# Patient Record
Sex: Female | Born: 1950 | Race: White | Hispanic: No | Smoking: Never smoker
Health system: Southern US, Community
[De-identification: ages and names within clinical notes are randomized; demographics above are authoritative.]

## PROBLEM LIST (undated history)

## (undated) DIAGNOSIS — I10 Essential (primary) hypertension: Secondary | ICD-10-CM

## (undated) DIAGNOSIS — K449 Diaphragmatic hernia without obstruction or gangrene: Secondary | ICD-10-CM

## (undated) DIAGNOSIS — E782 Mixed hyperlipidemia: Secondary | ICD-10-CM

## (undated) DIAGNOSIS — F410 Panic disorder [episodic paroxysmal anxiety] without agoraphobia: Secondary | ICD-10-CM

## (undated) DIAGNOSIS — E785 Hyperlipidemia, unspecified: Secondary | ICD-10-CM

## (undated) DIAGNOSIS — Z7722 Contact with and (suspected) exposure to environmental tobacco smoke (acute) (chronic): Secondary | ICD-10-CM

## (undated) DIAGNOSIS — F32A Depression, unspecified: Secondary | ICD-10-CM

## (undated) DIAGNOSIS — F329 Major depressive disorder, single episode, unspecified: Secondary | ICD-10-CM

## (undated) DIAGNOSIS — K649 Unspecified hemorrhoids: Secondary | ICD-10-CM

## (undated) DIAGNOSIS — Z87442 Personal history of urinary calculi: Secondary | ICD-10-CM

## (undated) DIAGNOSIS — K635 Polyp of colon: Secondary | ICD-10-CM

## (undated) DIAGNOSIS — N289 Disorder of kidney and ureter, unspecified: Secondary | ICD-10-CM

## (undated) DIAGNOSIS — K259 Gastric ulcer, unspecified as acute or chronic, without hemorrhage or perforation: Secondary | ICD-10-CM

## (undated) DIAGNOSIS — M1990 Unspecified osteoarthritis, unspecified site: Secondary | ICD-10-CM

## (undated) DIAGNOSIS — F419 Anxiety disorder, unspecified: Secondary | ICD-10-CM

## (undated) DIAGNOSIS — K219 Gastro-esophageal reflux disease without esophagitis: Secondary | ICD-10-CM

## (undated) HISTORY — DX: Contact with and (suspected) exposure to environmental tobacco smoke (acute) (chronic): Z77.22

## (undated) HISTORY — DX: Diaphragmatic hernia without obstruction or gangrene: K44.9

## (undated) HISTORY — DX: Mixed hyperlipidemia: E78.2

## (undated) HISTORY — PX: OOPHORECTOMY: SHX86

## (undated) HISTORY — DX: Essential (primary) hypertension: I10

## (undated) HISTORY — PX: UPPER GASTROINTESTINAL ENDOSCOPY: SHX188

## (undated) HISTORY — DX: Unspecified osteoarthritis, unspecified site: M19.90

## (undated) HISTORY — DX: Unspecified hemorrhoids: K64.9

## (undated) HISTORY — DX: Gastric ulcer, unspecified as acute or chronic, without hemorrhage or perforation: K25.9

## (undated) HISTORY — PX: COLONOSCOPY: SHX174

## (undated) HISTORY — PX: TUBAL LIGATION: SHX77

## (undated) HISTORY — PX: ABDOMINAL HYSTERECTOMY: SHX81

## (undated) HISTORY — DX: Anxiety disorder, unspecified: F41.9

## (undated) HISTORY — DX: Polyp of colon: K63.5

## (undated) HISTORY — DX: Hyperlipidemia, unspecified: E78.5

## (undated) HISTORY — DX: Major depressive disorder, single episode, unspecified: F32.9

## (undated) HISTORY — PX: HAND SURGERY: SHX662

## (undated) HISTORY — DX: Depression, unspecified: F32.A

## (undated) HISTORY — DX: Panic disorder (episodic paroxysmal anxiety): F41.0

## (undated) HISTORY — DX: Gastro-esophageal reflux disease without esophagitis: K21.9

---

## 1998-06-15 ENCOUNTER — Other Ambulatory Visit: Admission: RE | Admit: 1998-06-15 | Discharge: 1998-06-15 | Payer: Self-pay | Admitting: Gynecology

## 1998-06-18 ENCOUNTER — Ambulatory Visit (HOSPITAL_COMMUNITY): Admission: RE | Admit: 1998-06-18 | Discharge: 1998-06-18 | Payer: Self-pay | Admitting: Internal Medicine

## 1998-11-02 ENCOUNTER — Encounter: Payer: Self-pay | Admitting: Urology

## 1998-11-02 ENCOUNTER — Ambulatory Visit (HOSPITAL_COMMUNITY): Admission: RE | Admit: 1998-11-02 | Discharge: 1998-11-02 | Payer: Self-pay | Admitting: Urology

## 1999-05-09 ENCOUNTER — Encounter: Payer: Self-pay | Admitting: Emergency Medicine

## 1999-05-09 ENCOUNTER — Emergency Department (HOSPITAL_COMMUNITY): Admission: EM | Admit: 1999-05-09 | Discharge: 1999-05-09 | Payer: Self-pay | Admitting: Emergency Medicine

## 1999-06-22 ENCOUNTER — Other Ambulatory Visit: Admission: RE | Admit: 1999-06-22 | Discharge: 1999-06-22 | Payer: Self-pay | Admitting: Gynecology

## 1999-06-27 ENCOUNTER — Encounter: Payer: Self-pay | Admitting: Internal Medicine

## 1999-06-27 ENCOUNTER — Ambulatory Visit (HOSPITAL_COMMUNITY): Admission: RE | Admit: 1999-06-27 | Discharge: 1999-06-27 | Payer: Self-pay | Admitting: Internal Medicine

## 1999-09-21 ENCOUNTER — Encounter: Payer: Self-pay | Admitting: Emergency Medicine

## 1999-09-21 ENCOUNTER — Emergency Department (HOSPITAL_COMMUNITY): Admission: EM | Admit: 1999-09-21 | Discharge: 1999-09-21 | Payer: Self-pay | Admitting: Emergency Medicine

## 1999-10-20 ENCOUNTER — Encounter: Admission: RE | Admit: 1999-10-20 | Discharge: 1999-10-20 | Payer: Self-pay | Admitting: Neurosurgery

## 1999-10-20 ENCOUNTER — Encounter: Payer: Self-pay | Admitting: Neurosurgery

## 1999-11-15 ENCOUNTER — Encounter: Payer: Self-pay | Admitting: Neurosurgery

## 1999-11-15 ENCOUNTER — Ambulatory Visit (HOSPITAL_COMMUNITY): Admission: RE | Admit: 1999-11-15 | Discharge: 1999-11-15 | Payer: Self-pay | Admitting: Neurosurgery

## 1999-12-07 ENCOUNTER — Ambulatory Visit (HOSPITAL_COMMUNITY): Admission: RE | Admit: 1999-12-07 | Discharge: 1999-12-07 | Payer: Self-pay | Admitting: Neurosurgery

## 1999-12-07 ENCOUNTER — Encounter: Payer: Self-pay | Admitting: Neurosurgery

## 2000-05-17 ENCOUNTER — Encounter: Payer: Self-pay | Admitting: Gastroenterology

## 2000-05-17 ENCOUNTER — Encounter: Admission: RE | Admit: 2000-05-17 | Discharge: 2000-05-17 | Payer: Self-pay | Admitting: Gastroenterology

## 2000-06-28 ENCOUNTER — Encounter: Payer: Self-pay | Admitting: Internal Medicine

## 2000-06-28 ENCOUNTER — Other Ambulatory Visit: Admission: RE | Admit: 2000-06-28 | Discharge: 2000-06-28 | Payer: Self-pay | Admitting: Gynecology

## 2000-06-28 ENCOUNTER — Ambulatory Visit (HOSPITAL_COMMUNITY): Admission: RE | Admit: 2000-06-28 | Discharge: 2000-06-28 | Payer: Self-pay | Admitting: Internal Medicine

## 2000-10-31 ENCOUNTER — Ambulatory Visit (HOSPITAL_COMMUNITY): Admission: RE | Admit: 2000-10-31 | Discharge: 2000-10-31 | Payer: Self-pay | Admitting: Internal Medicine

## 2000-10-31 ENCOUNTER — Encounter: Payer: Self-pay | Admitting: Internal Medicine

## 2001-04-09 ENCOUNTER — Ambulatory Visit (HOSPITAL_COMMUNITY): Admission: RE | Admit: 2001-04-09 | Discharge: 2001-04-09 | Payer: Self-pay | Admitting: Internal Medicine

## 2001-04-09 ENCOUNTER — Encounter: Payer: Self-pay | Admitting: Internal Medicine

## 2001-07-01 ENCOUNTER — Other Ambulatory Visit: Admission: RE | Admit: 2001-07-01 | Discharge: 2001-07-01 | Payer: Self-pay | Admitting: Gynecology

## 2001-07-04 ENCOUNTER — Ambulatory Visit (HOSPITAL_COMMUNITY): Admission: RE | Admit: 2001-07-04 | Discharge: 2001-07-04 | Payer: Self-pay | Admitting: Internal Medicine

## 2001-07-04 ENCOUNTER — Encounter: Payer: Self-pay | Admitting: Internal Medicine

## 2002-07-11 ENCOUNTER — Encounter: Payer: Self-pay | Admitting: Internal Medicine

## 2002-07-11 ENCOUNTER — Encounter: Admission: RE | Admit: 2002-07-11 | Discharge: 2002-07-11 | Payer: Self-pay | Admitting: Internal Medicine

## 2002-07-18 ENCOUNTER — Other Ambulatory Visit: Admission: RE | Admit: 2002-07-18 | Discharge: 2002-07-18 | Payer: Self-pay | Admitting: Gynecology

## 2002-07-23 ENCOUNTER — Encounter: Admission: RE | Admit: 2002-07-23 | Discharge: 2002-07-23 | Payer: Self-pay | Admitting: Internal Medicine

## 2002-07-23 ENCOUNTER — Encounter: Payer: Self-pay | Admitting: Internal Medicine

## 2003-06-30 ENCOUNTER — Encounter: Admission: RE | Admit: 2003-06-30 | Discharge: 2003-06-30 | Payer: Self-pay | Admitting: Family Medicine

## 2003-07-06 ENCOUNTER — Encounter: Admission: RE | Admit: 2003-07-06 | Discharge: 2003-07-06 | Payer: Self-pay | Admitting: Internal Medicine

## 2003-07-20 ENCOUNTER — Other Ambulatory Visit: Admission: RE | Admit: 2003-07-20 | Discharge: 2003-07-20 | Payer: Self-pay | Admitting: Gynecology

## 2003-07-29 ENCOUNTER — Encounter: Admission: RE | Admit: 2003-07-29 | Discharge: 2003-07-29 | Payer: Self-pay | Admitting: Internal Medicine

## 2004-07-20 ENCOUNTER — Other Ambulatory Visit: Admission: RE | Admit: 2004-07-20 | Discharge: 2004-07-20 | Payer: Self-pay | Admitting: Gynecology

## 2004-08-01 ENCOUNTER — Encounter: Admission: RE | Admit: 2004-08-01 | Discharge: 2004-08-01 | Payer: Self-pay | Admitting: Gynecology

## 2004-09-23 ENCOUNTER — Encounter: Admission: RE | Admit: 2004-09-23 | Discharge: 2004-09-23 | Payer: Self-pay | Admitting: Internal Medicine

## 2005-07-25 ENCOUNTER — Other Ambulatory Visit: Admission: RE | Admit: 2005-07-25 | Discharge: 2005-07-25 | Payer: Self-pay | Admitting: Gynecology

## 2005-08-08 ENCOUNTER — Encounter: Admission: RE | Admit: 2005-08-08 | Discharge: 2005-08-08 | Payer: Self-pay | Admitting: Gynecology

## 2006-08-07 ENCOUNTER — Other Ambulatory Visit: Admission: RE | Admit: 2006-08-07 | Discharge: 2006-08-07 | Payer: Self-pay | Admitting: Gynecology

## 2006-08-09 ENCOUNTER — Encounter: Admission: RE | Admit: 2006-08-09 | Discharge: 2006-08-09 | Payer: Self-pay | Admitting: Internal Medicine

## 2006-09-04 HISTORY — PX: FOOT SURGERY: SHX648

## 2006-12-14 ENCOUNTER — Ambulatory Visit (HOSPITAL_COMMUNITY): Admission: RE | Admit: 2006-12-14 | Discharge: 2006-12-14 | Payer: Self-pay | Admitting: Internal Medicine

## 2007-01-05 ENCOUNTER — Emergency Department (HOSPITAL_COMMUNITY): Admission: EM | Admit: 2007-01-05 | Discharge: 2007-01-05 | Payer: Self-pay | Admitting: Emergency Medicine

## 2007-05-15 ENCOUNTER — Ambulatory Visit (HOSPITAL_COMMUNITY): Admission: RE | Admit: 2007-05-15 | Discharge: 2007-05-15 | Payer: Self-pay | Admitting: Internal Medicine

## 2007-08-12 ENCOUNTER — Other Ambulatory Visit: Admission: RE | Admit: 2007-08-12 | Discharge: 2007-08-12 | Payer: Self-pay | Admitting: Gynecology

## 2007-08-14 ENCOUNTER — Encounter: Admission: RE | Admit: 2007-08-14 | Discharge: 2007-08-14 | Payer: Self-pay | Admitting: Internal Medicine

## 2007-11-06 ENCOUNTER — Ambulatory Visit: Payer: Self-pay | Admitting: Internal Medicine

## 2007-12-04 ENCOUNTER — Ambulatory Visit: Payer: Self-pay | Admitting: Internal Medicine

## 2007-12-12 ENCOUNTER — Ambulatory Visit (HOSPITAL_COMMUNITY): Admission: RE | Admit: 2007-12-12 | Discharge: 2007-12-12 | Payer: Self-pay | Admitting: Internal Medicine

## 2007-12-24 ENCOUNTER — Ambulatory Visit: Payer: Self-pay | Admitting: Internal Medicine

## 2008-03-24 ENCOUNTER — Ambulatory Visit (HOSPITAL_COMMUNITY): Admission: RE | Admit: 2008-03-24 | Discharge: 2008-03-24 | Payer: Self-pay | Admitting: Internal Medicine

## 2008-08-14 ENCOUNTER — Encounter: Admission: RE | Admit: 2008-08-14 | Discharge: 2008-08-14 | Payer: Self-pay | Admitting: Internal Medicine

## 2008-09-08 ENCOUNTER — Ambulatory Visit (HOSPITAL_COMMUNITY): Admission: RE | Admit: 2008-09-08 | Discharge: 2008-09-08 | Payer: Self-pay | Admitting: Internal Medicine

## 2009-08-03 ENCOUNTER — Ambulatory Visit (HOSPITAL_COMMUNITY): Admission: RE | Admit: 2009-08-03 | Discharge: 2009-08-03 | Payer: Self-pay | Admitting: Internal Medicine

## 2009-10-04 ENCOUNTER — Ambulatory Visit (HOSPITAL_COMMUNITY): Admission: RE | Admit: 2009-10-04 | Discharge: 2009-10-04 | Payer: Self-pay | Admitting: Internal Medicine

## 2010-08-04 ENCOUNTER — Ambulatory Visit (HOSPITAL_COMMUNITY)
Admission: RE | Admit: 2010-08-04 | Discharge: 2010-08-04 | Payer: Self-pay | Source: Home / Self Care | Admitting: Gynecology

## 2010-08-31 ENCOUNTER — Encounter
Admission: RE | Admit: 2010-08-31 | Discharge: 2010-09-06 | Payer: Self-pay | Source: Home / Self Care | Attending: Sports Medicine | Admitting: Sports Medicine

## 2010-09-24 ENCOUNTER — Encounter: Payer: Self-pay | Admitting: Anesthesiology

## 2010-09-26 ENCOUNTER — Encounter: Payer: Self-pay | Admitting: Internal Medicine

## 2011-01-17 NOTE — Assessment & Plan Note (Signed)
[facility]                         GASTROENTEROLOGY OFFICE NOTE   NAME:[name], [name]                       MRN:          [id number]  DATE:12/04/2007                            DOB:          1950/12/09    CHIEF COMPLAINT:  Stomach nausea, vomiting for three months.   ASSESSMENT:  A 60 year old white woman with chronic recurrent  regurgitation.  Some nausea and vomiting, more of regurgitation.  Recent  EGD in [facility] (Dr. Noe [name]) showed some esophagitis and an 8 mm  gastric ulcer.  I do not have those biopsy reports yet.  She did not  want to return to him because she was dissatisfied with his care.   PLAN:  1. Check gastric emptying study.  2. Get results of upper endoscopy.  3. Further plans pending that.   I need the pathology results.  She will continue her other medications  at this time.   HISTORY:  This is a 60 year old white woman who for the past three  months or so has had problems with regurgitation, which is sudden, can  occur when she bends over but it can occur spontaneously otherwise.  She  has some heartburn.  She feels nauseous at times.  She is having some  periumbilical abdominal pain intermittently at times.  Because of these  problems she went to Dr. Noe [name] because she thought she could get an  endoscopy at a cheaper rate than around here.  She was unhappy because  he did not speak to her after the procedure.  Findings as outlined  above.  He also thought she had gastritis.  It looks like a CLO test and  a biopsy of the ulcer in the distal esophagus were taken.  I have the  procedure report to review, but do not have the pathology.  Labs from  Dr. Oneta [name] on October 15, 2007 showed a normal CBC, BMET.  Cholesterol, liver profile normal.  TSH normal.  Vitamin D normal.   PAST MEDICAL HISTORY:  1. Gastroesophageal reflux disease.  2. Hypertension.  3. Dyslipidemia.  4. Anxiety.  5. Panic disorder.  6. Depression.  7. Nephrolithiasis.  8. Chronic headaches.  9. Allergies.  10.Hysterectomy.  11.Tubal ligation.   FAMILY HISTORY:  Heart disease in multiple siblings and her father.  No  colon cancer reported.   (She has never had a colonoscopy because she said she cannot really  swallow the prep well, and most importantly she cannot go without eating  in the morning.)  She is married.  She is a housewife.  Tenth grade  education.  Two daughters.  Lives with her husband.  No alcohol, tobacco  or drugs.   REVIEW OF SYSTEMS:  See my medical history form for full details.   PHYSICAL EXAMINATION:  Height 5 feet 5 inches.  Weight 186 pounds.  Blood pressure 108/66, pulse 88.  EYES:  Anicteric.  ENT:  Normal mouth and posterior pharynx.  NECK:  Supple without thyromegaly or masses.  CHEST:  Clear.  HEART:  S1, S2.  No murmurs or gallops.  ABDOMEN:  Soft, nontender.  No succession splash.  No organomegaly or  mass.  LYMPHATIC:  No neck or supraclavicular nodes.  EXTREMITIES:  Free of edema.  SKIN:  Tanned.  (Uses tanning bed.  Counseled as to the risks and that  she should not.)  No rash.  NEURO:  Cranial nerves II-XII grossly intact.  PSYCH:  Alert and oriented x3.  Appropriate mood and affect.   I appreciate the opportunity to care for this patient.   NOTE:  I told her we would discuss screening colonoscopy at a later date  and that she should remain off her promethazine 48 hours prior to her  gastric emptying study.     Latoya Boop, MD,FACG  Electronically Signed    CEG/MedQ  DD: 12/04/2007  DT: 12/04/2007  Job #: [id number]   cc:   Latoya [name], M.D.

## 2011-03-07 ENCOUNTER — Emergency Department (HOSPITAL_COMMUNITY)
Admission: EM | Admit: 2011-03-07 | Discharge: 2011-03-07 | Disposition: A | Discharge status: Home / Self Care | Payer: Self-pay | Attending: Emergency Medicine | Admitting: Emergency Medicine

## 2011-03-07 DIAGNOSIS — I10 Essential (primary) hypertension: Secondary | ICD-10-CM | POA: Insufficient documentation

## 2011-03-07 DIAGNOSIS — N949 Unspecified condition associated with female genital organs and menstrual cycle: Secondary | ICD-10-CM | POA: Insufficient documentation

## 2011-03-07 LAB — URINALYSIS, ROUTINE W REFLEX MICROSCOPIC
Bilirubin Urine: NEGATIVE
Glucose, UA: NEGATIVE mg/dL
Hgb urine dipstick: NEGATIVE
Ketones, ur: NEGATIVE mg/dL
Leukocytes, UA: NEGATIVE
Nitrite: NEGATIVE
Protein, ur: NEGATIVE mg/dL
Specific Gravity, Urine: 1.015 (ref 1.005–1.030)
Urobilinogen, UA: 0.2 mg/dL (ref 0.0–1.0)
pH: 5.5 (ref 5.0–8.0)

## 2011-05-23 ENCOUNTER — Emergency Department (HOSPITAL_COMMUNITY): Payer: Self-pay

## 2011-05-23 ENCOUNTER — Emergency Department (HOSPITAL_COMMUNITY)
Admission: EM | Admit: 2011-05-23 | Discharge: 2011-05-23 | Disposition: A | Discharge status: Home / Self Care | Payer: Self-pay | Attending: Emergency Medicine | Admitting: Emergency Medicine

## 2011-05-23 DIAGNOSIS — M25519 Pain in unspecified shoulder: Secondary | ICD-10-CM | POA: Insufficient documentation

## 2011-05-23 DIAGNOSIS — E7800 Pure hypercholesterolemia, unspecified: Secondary | ICD-10-CM | POA: Insufficient documentation

## 2011-05-23 DIAGNOSIS — I10 Essential (primary) hypertension: Secondary | ICD-10-CM | POA: Insufficient documentation

## 2011-05-23 DIAGNOSIS — W07XXXA Fall from chair, initial encounter: Secondary | ICD-10-CM | POA: Insufficient documentation

## 2011-05-23 DIAGNOSIS — Y9229 Other specified public building as the place of occurrence of the external cause: Secondary | ICD-10-CM | POA: Insufficient documentation

## 2011-06-16 ENCOUNTER — Other Ambulatory Visit (HOSPITAL_COMMUNITY): Payer: Self-pay | Admitting: Internal Medicine

## 2011-06-16 DIAGNOSIS — Z1231 Encounter for screening mammogram for malignant neoplasm of breast: Secondary | ICD-10-CM

## 2011-07-24 ENCOUNTER — Ambulatory Visit
Admission: RE | Admit: 2011-07-24 | Discharge: 2011-07-24 | Disposition: A | Discharge status: Home / Self Care | Payer: No Typology Code available for payment source | Source: Ambulatory Visit | Attending: Gynecology | Admitting: Gynecology

## 2011-07-24 ENCOUNTER — Other Ambulatory Visit: Payer: Self-pay | Admitting: Gynecology

## 2011-07-24 MED ORDER — IOHEXOL 300 MG/ML  SOLN
100.0000 mL | Freq: Once | INTRAMUSCULAR | Status: AC | PRN
  Administered 2011-07-24: 100 mL via INTRAVENOUS

## 2011-08-07 ENCOUNTER — Ambulatory Visit (HOSPITAL_COMMUNITY): Payer: Self-pay

## 2011-09-07 ENCOUNTER — Ambulatory Visit (HOSPITAL_COMMUNITY)
Admission: RE | Admit: 2011-09-07 | Discharge: 2011-09-07 | Disposition: A | Discharge status: Home / Self Care | Payer: No Typology Code available for payment source | Source: Ambulatory Visit | Attending: Internal Medicine | Admitting: Internal Medicine

## 2011-09-07 ENCOUNTER — Other Ambulatory Visit (HOSPITAL_COMMUNITY): Payer: Self-pay | Admitting: Internal Medicine

## 2011-09-07 DIAGNOSIS — R05 Cough: Secondary | ICD-10-CM

## 2011-09-07 DIAGNOSIS — R059 Cough, unspecified: Secondary | ICD-10-CM

## 2011-09-07 DIAGNOSIS — R079 Chest pain, unspecified: Secondary | ICD-10-CM | POA: Insufficient documentation

## 2011-09-07 DIAGNOSIS — R062 Wheezing: Secondary | ICD-10-CM | POA: Insufficient documentation

## 2011-09-08 ENCOUNTER — Ambulatory Visit (HOSPITAL_COMMUNITY)
Admission: RE | Admit: 2011-09-08 | Discharge: 2011-09-08 | Disposition: A | Discharge status: Home / Self Care | Payer: No Typology Code available for payment source | Source: Ambulatory Visit | Attending: Internal Medicine | Admitting: Internal Medicine

## 2011-09-08 DIAGNOSIS — Z1231 Encounter for screening mammogram for malignant neoplasm of breast: Secondary | ICD-10-CM

## 2012-06-10 ENCOUNTER — Other Ambulatory Visit (HOSPITAL_COMMUNITY): Payer: Self-pay | Admitting: Internal Medicine

## 2012-06-10 DIAGNOSIS — Z1231 Encounter for screening mammogram for malignant neoplasm of breast: Secondary | ICD-10-CM

## 2012-06-27 ENCOUNTER — Ambulatory Visit (HOSPITAL_COMMUNITY): Payer: Self-pay

## 2012-07-10 ENCOUNTER — Emergency Department (HOSPITAL_COMMUNITY)
Admission: EM | Admit: 2012-07-10 | Discharge: 2012-07-10 | Disposition: A | Discharge status: Home / Self Care | Payer: Self-pay | Attending: Emergency Medicine | Admitting: Emergency Medicine

## 2012-07-10 ENCOUNTER — Emergency Department (HOSPITAL_COMMUNITY): Payer: Self-pay

## 2012-07-10 ENCOUNTER — Encounter (HOSPITAL_COMMUNITY): Payer: Self-pay | Admitting: Family Medicine

## 2012-07-10 DIAGNOSIS — E7800 Pure hypercholesterolemia, unspecified: Secondary | ICD-10-CM | POA: Insufficient documentation

## 2012-07-10 DIAGNOSIS — IMO0002 Reserved for concepts with insufficient information to code with codable children: Secondary | ICD-10-CM | POA: Insufficient documentation

## 2012-07-10 DIAGNOSIS — S8010XA Contusion of unspecified lower leg, initial encounter: Secondary | ICD-10-CM | POA: Insufficient documentation

## 2012-07-10 DIAGNOSIS — Y93E1 Activity, personal bathing and showering: Secondary | ICD-10-CM | POA: Insufficient documentation

## 2012-07-10 DIAGNOSIS — Z79899 Other long term (current) drug therapy: Secondary | ICD-10-CM | POA: Insufficient documentation

## 2012-07-10 DIAGNOSIS — W010XXA Fall on same level from slipping, tripping and stumbling without subsequent striking against object, initial encounter: Secondary | ICD-10-CM | POA: Insufficient documentation

## 2012-07-10 DIAGNOSIS — Y929 Unspecified place or not applicable: Secondary | ICD-10-CM | POA: Insufficient documentation

## 2012-07-10 DIAGNOSIS — S43402A Unspecified sprain of left shoulder joint, initial encounter: Secondary | ICD-10-CM

## 2012-07-10 DIAGNOSIS — I10 Essential (primary) hypertension: Secondary | ICD-10-CM | POA: Insufficient documentation

## 2012-07-10 MED ORDER — HYDROCODONE-ACETAMINOPHEN 5-325 MG PO TABS: 1.0000 | ORAL_TABLET | Freq: Four times a day (QID) | ORAL | Status: DC | PRN

## 2012-07-10 MED ORDER — HYDROCODONE-ACETAMINOPHEN 5-325 MG PO TABS
1.0000 | ORAL_TABLET | Freq: Once | ORAL | Status: AC
  Administered 2012-07-10: 1 via ORAL
  Filled 2012-07-10: qty 1

## 2012-07-10 NOTE — ED Notes (Signed)
Per pt fell in the bathtub and now is having left shoulder and leg pain. sts slipped on soap. Denies hitting head. Denies syncope. Pt has swelling to leg and limited movement in shoulder.

## 2012-07-10 NOTE — ED Provider Notes (Signed)
History  This chart was scribed for Celene Kras, MD by Thad Ranger and Shari Heritage. This patient was seen in room TR09C/TR09C and the patient's care was started at 4:45 PM.  CSN: [id number] Arrival date & time 07/10/12  1612  Chief Complaint  Patient presents with  . Fall   The history is provided by the patient. No language interpreter was used.   HPI Comments: Latoya [name] is a 61 y.o. female who presents to the Emergency Department complaining of constant, severe, non-radiating, dull left shoulder pain; constant, moderate, dull, left ankle pain; and constant, moderate, dull left shin pain resulting from a fall in the bathtub that occurred earlier today. There is associated swelling and bruising to left ankle and left shin. Patient states that her ROM is normal. Pain is worse while standing and is relieved with rest. She denies head injury, LOC, fever, nausea, vomiting, numbness or weakness. Patient denies smoking and alcohol use.    Past Medical History  Diagnosis Date  . Hypertension   . High cholesterol    History reviewed. No pertinent past surgical history.  History reviewed. No pertinent family history.  History  Substance Use Topics  . Smoking status: Never Smoker   . Smokeless tobacco: Not on file  . Alcohol Use: No   No OB history provided.  Review of Systems  Constitutional: Negative for fever.  Gastrointestinal: Negative for nausea and vomiting.  Musculoskeletal: Positive for arthralgias.  Neurological: Negative for syncope, weakness and numbness.   Allergies  Other; Levaquin; Penicillins; Sulfa antibiotics; and Trovan  Home Medications   Current Outpatient Rx  Name  Route  Sig  Dispense  Refill  . CLONAZEPAM 2 MG PO TABS   Oral   Take 1-2 mg by mouth 3 (three) times daily. Take half tablet twice a day and one tablet at night          BP 179/89  Pulse 79  Temp 98 F (36.7 C)  Resp 18  SpO2 95%  Physical Exam  Nursing note and vitals  reviewed. Constitutional: She appears well-developed and well-nourished. No distress.  HENT:  Head: Normocephalic and atraumatic.  Right Ear: External ear normal.  Left Ear: External ear normal.  Eyes: Conjunctivae normal are normal. Right eye exhibits no discharge. Left eye exhibits no discharge. No scleral icterus.  Neck: Neck supple. No tracheal deviation present.  Cardiovascular: Normal rate.   Pulmonary/Chest: Effort normal. No stridor. No respiratory distress.  Musculoskeletal: Normal range of motion. She exhibits tenderness.       Left shoulder: She exhibits tenderness. She exhibits normal range of motion.       Left knee: no tenderness found.       Left ankle: no tenderness.       Mild tenderness to left shoulder. Full ROM. Neurovascularly intact distally.  Contusion and edema to left proximal tibia anteriorly. No ankle or knee tenderness. Small superficial abrasion to left shin.   Neurological: She is alert. Cranial nerve deficit: no gross deficits.  Skin: Skin is warm and dry. No rash noted.  Psychiatric: She has a normal mood and affect.   ED Course  Procedures (including critical care time) DIAGNOSTIC STUDIES: Oxygen Saturation is 95% on room air, adequate by my interpretation.    COORDINATION OF CARE: 4:45 PM Discussed treatment plan with pt at bedside and pt agreed to plan.  Dg Tibia/fibula Left  07/10/2012  *RADIOLOGY REPORT*  Clinical Data: Fall, left tibia-fibula pain, laceration  LEFT  TIBIA AND FIBULA - 2 VIEW  Comparison: None.  Findings: Intact left tibia and fibula.  Negative for fracture or malalignment.  Postop changes of the foot.  IMPRESSION: No acute osseous finding.   Original Report Authenticated By: Judie Petit. Miles Costain, M.D.    Dg Shoulder Left  07/10/2012  *RADIOLOGY REPORT*  Clinical Data: Fall  LEFT SHOULDER - 2+ VIEW  Comparison: None.  Findings: Three views of the left shoulder submitted.  No acute fracture or subluxation.  Mild degenerative changes left AC  joint.  IMPRESSION: No acute fracture or subluxation.  Mild degenerative changes left AC joint.   Original Report Authenticated By: Natasha Mead, M.D.    1. Sprain of left shoulder   2. Contusion of leg       MDM  Pt without signs of serious injury.  No fracture or dislocation.      I personally performed the services described in this documentation, which was scribed in my presence. The recorded information has been reviewed and considered.    Celene Kras, MD 07/12/12 [phone]

## 2012-07-10 NOTE — Progress Notes (Signed)
Orthopedic Tech Progress Note Patient Details:  Latoya [name] 23-Mar-1951 [id number] Patient says she has her own crutches. Ortho Devices Type of Ortho Device: Crutches   Jennye Moccasin 07/10/2012, 5:18 PM

## 2012-07-10 NOTE — ED Notes (Signed)
Patient transported to X-ray 

## 2012-09-09 ENCOUNTER — Ambulatory Visit (HOSPITAL_COMMUNITY): Payer: Self-pay

## 2012-09-25 ENCOUNTER — Ambulatory Visit (HOSPITAL_COMMUNITY)
Admission: RE | Admit: 2012-09-25 | Discharge: 2012-09-25 | Disposition: A | Discharge status: Home / Self Care | Payer: Self-pay | Source: Ambulatory Visit | Attending: Internal Medicine | Admitting: Internal Medicine

## 2012-09-25 DIAGNOSIS — Z1231 Encounter for screening mammogram for malignant neoplasm of breast: Secondary | ICD-10-CM

## 2012-09-25 LAB — HM MAMMOGRAPHY: HM Mammogram: NEGATIVE

## 2012-11-18 ENCOUNTER — Encounter: Payer: Self-pay | Admitting: Internal Medicine

## 2012-11-28 ENCOUNTER — Telehealth: Payer: Self-pay | Admitting: Internal Medicine

## 2012-11-28 NOTE — Telephone Encounter (Signed)
Epigastric pain radiates to the right sided with nausea and vomiting x 1 month.  No other GI complaints.  Has a history of GERD that has gotten worse on OTC omeprazole twice daily 1 hour before eating and then again before bed.  Pt scheduled for 11/29/12 330 pm with Shanda Bumps

## 2012-11-28 NOTE — Telephone Encounter (Signed)
Saw Dr. Leone Payor in 2009.  Has appointment 12/10/12.  Patient would like to be seen sooner.  She states that her symptoms of throwing up and stomach pains have gotten progressively worse.

## 2012-11-29 ENCOUNTER — Ambulatory Visit (INDEPENDENT_AMBULATORY_CARE_PROVIDER_SITE_OTHER): Payer: No Typology Code available for payment source | Admitting: Gastroenterology

## 2012-11-29 ENCOUNTER — Encounter: Payer: Self-pay | Admitting: Gastroenterology

## 2012-11-29 VITALS — BP 114/76 | HR 64 | Ht 64.5 in | Wt 164.6 lb

## 2012-11-29 DIAGNOSIS — R1013 Epigastric pain: Secondary | ICD-10-CM

## 2012-11-29 DIAGNOSIS — Z1211 Encounter for screening for malignant neoplasm of colon: Secondary | ICD-10-CM

## 2012-11-29 DIAGNOSIS — K219 Gastro-esophageal reflux disease without esophagitis: Secondary | ICD-10-CM | POA: Insufficient documentation

## 2012-11-29 DIAGNOSIS — R1011 Right upper quadrant pain: Secondary | ICD-10-CM

## 2012-11-29 MED ORDER — DEXLANSOPRAZOLE 60 MG PO CPDR: 60.0000 mg | DELAYED_RELEASE_CAPSULE | Freq: Every day | ORAL | Status: DC

## 2012-11-29 MED ORDER — HYOSCYAMINE SULFATE 0.125 MG SL SUBL: 0.1250 mg | SUBLINGUAL_TABLET | SUBLINGUAL | Status: DC | PRN

## 2012-11-29 NOTE — Patient Instructions (Addendum)
Abdominal Ultrasound is scheduled for Mon 12-02-2012 at 8 Am. Arrive to [facility], go to admitting 1st floor they will tell you where Radiology is. Have nothing to eat or drink after midnight. We sent prescriptions for the Levsin , for cramping and spasms to your pharmacy.   Also sent one for Dexilant 60 mg, samples given.   You have been scheduled for an endoscopy with propofol. Please follow written instructions given to you at your visit today. If you use inhalers (even only as needed), please bring them with you on the day of your procedure.

## 2012-11-29 NOTE — Progress Notes (Signed)
11/29/2012 Latoya [name] [id number] September 18, 1950   HISTORY OF PRESENT ILLNESS:  Patient is a 62 year old female who presents to our office today with complaints of regurgitation.  She says that it occurs whether or not she eats.  It occurs when she bends over, but it can occur spontaneously otherwise. She has some heartburn. She feels nauseous at times.  She is complaining of abdominal pain across her upper abdomen and one area with localized soreness in the RUQ.  She was seen here in 2009 with similar complaints; GES was ordered and was borderline abnormal.  Prior to that visit with Dr. Leone Payor, she had seen Dr. Noe Gens in [facility] because she thought she could get an endoscopy at a cheaper rate than around here. She was unhappy because he did not speak to her after the procedure.  She also saw Dr. Randa Evens years ago as well, but was unhappy with him also.  She does not even recall seeing Dr. Leone Payor in 2009 or having the GES performed.  Findings on the EGD were as follows:  Esophagitis and an 8 mm gastric ulcer; biopsies were obtained but we do not have those results.  She is currently taking prilosec 20 mg daily.  She was told that she needed to return to schedule a colonoscopy, which she did not do.  She had recent labs by her PCP and we are working on getting those results.  She says that she has seen blood in her stool on plenty of occasions in the past, but nothing recent.  BM's are regular about every other day.   Past Medical History  Diagnosis Date  . Hypertension   . Hyperlipemia   . Anxiety and depression   . Arthritis    Past Surgical History  Procedure Laterality Date  . Abdominal hysterectomy    . Tubal ligation    . Foot surgery      reports that she has never smoked. She has never used smokeless tobacco. She reports that she does not drink alcohol or use illicit drugs. family history includes Asthma in her mother; Heart disease in her brother and father; and Stroke in her  brother. Allergies  Allergen Reactions  . Other     All pain medications: Unknown  . Levaquin (Levofloxacin) Rash  . Penicillins Rash  . Sulfa Antibiotics Rash  . Trovan (Alatrofloxacin) Rash      Outpatient Encounter Prescriptions as of 11/29/2012  Medication Sig Dispense Refill  . aspirin 81 MG chewable tablet Chew 81 mg by mouth daily.      . B Complex-C (B-COMPLEX WITH VITAMIN C) tablet Take 1 tablet by mouth daily.      . Biotin 1000 MCG tablet Take 1,000 mcg by mouth 3 (three) times daily.      . bumetanide (BUMEX) 2 MG tablet Take 2 mg by mouth daily.      . calcium-vitamin D (OSCAL WITH D) 500-200 MG-UNIT per tablet Take 1 tablet by mouth daily.      . cholecalciferol (VITAMIN D) 1000 UNITS tablet Take 1,000 Units by mouth daily.      . clonazePAM (KLONOPIN) 2 MG tablet Take 1-2 mg by mouth 3 (three) times daily. Take half tablet twice a day and one tablet at night      . enalapril (VASOTEC) 20 MG tablet Take 10 mg by mouth daily.      Marland Kitchen glucosamine-chondroitin 500-400 MG tablet Take 1 tablet by mouth 3 (three) times daily.      Marland Kitchen  HYDROcodone-acetaminophen (NORCO) 5-325 MG per tablet Take 1-2 tablets by mouth every 6 (six) hours as needed for pain.  16 tablet  0  . Multiple Vitamin (MULTIVITAMIN WITH MINERALS) TABS Take 1 tablet by mouth daily.      Marland Kitchen omeprazole (PRILOSEC) 20 MG capsule Take 20 mg by mouth daily.      . pravastatin (PRAVACHOL) 40 MG tablet Take 40 mg by mouth at bedtime.      Marland Kitchen dexlansoprazole (DEXILANT) 60 MG capsule Take 1 capsule (60 mg total) by mouth daily.  90 capsule  3  . hyoscyamine (LEVSIN SL) 0.125 MG SL tablet Place 1 tablet (0.125 mg total) under the tongue every 4 (four) hours as needed for cramping.  30 tablet  0   No facility-administered encounter medications on file as of 11/29/2012.     REVIEW OF SYSTEMS  : All other systems reviewed and negative except where noted in the History of Present Illness.   PHYSICAL EXAM: BP 114/76  Pulse 64   Ht 5' 4.5" (1.638 m)  Wt 164 lb 9.6 oz (74.662 kg)  BMI 27.83 kg/m2 General: Well developed white female in no acute distress Head: Normocephalic and atraumatic Eyes:  sclerae anicteric,conjunctive pink. Ears: Normal auditory acuity Lungs: Clear throughout to auscultation Heart: Regular rate and rhythm Abdomen: Soft, non-distended. No masses or hepatomegaly noted. Normal bowel sounds.  Diffuse TTP > in the upper abdomen. Rectal: Deferred.  Will be done at the time of colonoscopy. Musculoskeletal: Symmetrical with no gross deformities  Skin: No lesions on visible extremities Extremities: Has some non-pitting edema in B/L LE's. Neurological: Alert oriented x 4, grossly nonfocal Psychological:  Alert and cooperative. Normal mood and affect  ASSESSMENT AND PLAN: -Epigastric abdominal pain -RUQ abdominal pain -GERD with regurgitation -Screening colonoscopy  *Patient never returned from 2009 to undergo a screening colonoscopy so we will schedule that procedure.  *She will also have a repeat EGD since 2009 she had esophagitis and an ulcer.  Also has ongoing symptoms. *Will discontinue prilosec and try Dexilant 60 mg daily (samples and prescription given). *Will obtain recent lab results from her PCP. *Will order ultrasound of her abdomen as well. *Can try levsin 0.125 mg prn in the interim while evaluation is pending as well.

## 2012-12-02 ENCOUNTER — Ambulatory Visit (HOSPITAL_COMMUNITY)
Admission: RE | Admit: 2012-12-02 | Discharge: 2012-12-02 | Disposition: A | Discharge status: Home / Self Care | Payer: No Typology Code available for payment source | Source: Ambulatory Visit | Attending: Gastroenterology | Admitting: Gastroenterology

## 2012-12-02 DIAGNOSIS — K219 Gastro-esophageal reflux disease without esophagitis: Secondary | ICD-10-CM

## 2012-12-02 DIAGNOSIS — R1013 Epigastric pain: Secondary | ICD-10-CM | POA: Insufficient documentation

## 2012-12-02 DIAGNOSIS — K7689 Other specified diseases of liver: Secondary | ICD-10-CM | POA: Insufficient documentation

## 2012-12-04 NOTE — Progress Notes (Signed)
Agree with management per Ms. Zehr. 

## 2012-12-10 ENCOUNTER — Ambulatory Visit: Payer: Self-pay | Admitting: Internal Medicine

## 2012-12-12 ENCOUNTER — Encounter: Payer: Self-pay | Admitting: Internal Medicine

## 2012-12-12 ENCOUNTER — Ambulatory Visit (AMBULATORY_SURGERY_CENTER): Payer: No Typology Code available for payment source | Admitting: Internal Medicine

## 2012-12-12 VITALS — BP 143/89 | HR 60 | Temp 98.4°F | Resp 12 | Ht 64.0 in | Wt 164.0 lb

## 2012-12-12 DIAGNOSIS — K648 Other hemorrhoids: Secondary | ICD-10-CM

## 2012-12-12 DIAGNOSIS — R1013 Epigastric pain: Secondary | ICD-10-CM

## 2012-12-12 DIAGNOSIS — D126 Benign neoplasm of colon, unspecified: Secondary | ICD-10-CM

## 2012-12-12 DIAGNOSIS — Z1211 Encounter for screening for malignant neoplasm of colon: Secondary | ICD-10-CM

## 2012-12-12 DIAGNOSIS — K219 Gastro-esophageal reflux disease without esophagitis: Secondary | ICD-10-CM

## 2012-12-12 DIAGNOSIS — K644 Residual hemorrhoidal skin tags: Secondary | ICD-10-CM

## 2012-12-12 DIAGNOSIS — D130 Benign neoplasm of esophagus: Secondary | ICD-10-CM

## 2012-12-12 LAB — HM COLONOSCOPY

## 2012-12-12 MED ORDER — SODIUM CHLORIDE 0.9 % IV SOLN: 500.0000 mL | INTRAVENOUS | Status: DC

## 2012-12-12 MED ORDER — PANTOPRAZOLE SODIUM 40 MG PO TBEC: 40.0000 mg | DELAYED_RELEASE_TABLET | Freq: Every day | ORAL | Status: DC

## 2012-12-12 NOTE — Progress Notes (Signed)
Called to room to assist during endoscopic procedure.  Patient ID and intended procedure confirmed with present staff. Received instructions for my participation in the procedure from the performing physician. ewm 

## 2012-12-12 NOTE — Op Note (Signed)
[facility] [address] [place], [address]   ENDOSCOPY PROCEDURE REPORT  PATIENT: Latoya, [name].  MR#: [id number] BIRTHDATE: 1950/10/22 , 61  yrs. old GENDER: Female ENDOSCOPIST: Iva Boop, MD, [facility] PROCEDURE DATE:  12/12/2012 PROCEDURE:  EGD w/ biopsy ASA CLASS:     Class II INDICATIONS:  Epigastric pain.   Vomiting.   Follow up of reflux esophagitis. MEDICATIONS: propofol (Diprivan) 100mg  IV, MAC sedation, administered by CRNA, and These medications were titrated to patient response per physician's verbal order TOPICAL ANESTHETIC: Cetacaine Spray  DESCRIPTION OF PROCEDURE: After the risks benefits and alternatives of the procedure were thoroughly explained, informed consent was obtained.  The LB GIF-H180 [id number] endoscope was introduced through the mouth and advanced to the second portion of the duodenum. Without limitations.  The instrument was slowly withdrawn as the mucosa was fully examined.        ESOPHAGUS: Abnormal mucosa was found in the entire esophagus. Fissured mucosa.  Multiple biopsies were performed using cold forceps.  Sample sent for histology.  The remainder of the upper endoscopy exam was otherwise normal. Retroflexed views revealed no abnormalities.     The scope was then withdrawn from the patient and the procedure completed.  COMPLICATIONS: There were no complications. ENDOSCOPIC IMPRESSION: 1.   Abnormal mucosa was found in the entire esophagus; multiple biopsies 2.   The remainder of the upper endoscopy exam was otherwise normal  RECOMMENDATIONS: Await pathology results, colonoscopy next   eSigned:  Iva Boop, MD, [facility] 12/12/2012 9:01 AM   CC:The Patient and Lucky Cowboy, MD

## 2012-12-12 NOTE — Patient Instructions (Addendum)
There was possible inflammation in the esophagus - biopsies taken. The stomach and duodenum looked normal.  One small polyp was removed from the colon - do not worry - I think it is benign. You also have small hemorrhoids.  I have prescribed generic Protonix to help your stomach pain. Please pick it up at your pharmacy.  My office will call with results and plans.  Thank you for choosing me and [facility]. Iva Boop, MD, FACG  YOU HAD AN ENDOSCOPIC PROCEDURE TODAY AT THE [facility]: Refer to the procedure report that was given to you for any specific questions about what was found during the examination.  If the procedure report does not answer your questions, please call your gastroenterologist to clarify.  If you requested that your care partner not be given the details of your procedure findings, then the procedure report has been included in a sealed envelope for you to review at your convenience later.  YOU SHOULD EXPECT: Some feelings of bloating in the abdomen. Passage of more gas than usual.  Walking can help get rid of the air that was put into your GI tract during the procedure and reduce the bloating. If you had a lower endoscopy (such as a colonoscopy or flexible sigmoidoscopy) you may notice spotting of blood in your stool or on the toilet paper. If you underwent a bowel prep for your procedure, then you may not have a normal bowel movement for a few days.  DIET: Your first meal following the procedure should be a light meal and then it is ok to progress to your normal diet.  A half-sandwich or bowl of soup is an example of a good first meal.  Heavy or fried foods are harder to digest and may make you feel nauseous or bloated.  Likewise meals heavy in dairy and vegetables can cause extra gas to form and this can also increase the bloating.  Drink plenty of fluids but you should avoid alcoholic beverages for 24 hours.  ACTIVITY: Your care partner  should take you home directly after the procedure.  You should plan to take it easy, moving slowly for the rest of the day.  You can resume normal activity the day after the procedure however you should NOT DRIVE or use heavy machinery for 24 hours (because of the sedation medicines used during the test).    SYMPTOMS TO REPORT IMMEDIATELY: A gastroenterologist can be reached at any hour.  During normal business hours, 8:30 AM to 5:00 PM Monday through Friday, call [phone].  After hours and on weekends, please call the GI answering service at [phone] who will take a message and have the physician on call contact you.   Following lower endoscopy (colonoscopy or flexible sigmoidoscopy):  Excessive amounts of blood in the stool  Significant tenderness or worsening of abdominal pains  Swelling of the abdomen that is new, acute  Fever of 100F or higher  Following upper endoscopy (EGD)  Vomiting of blood or coffee ground material  New chest pain or pain under the shoulder blades  Painful or persistently difficult swallowing  New shortness of breath  Fever of 100F or higher  Black, tarry-looking stools  FOLLOW UP: If any biopsies were taken you will be contacted by phone or by letter within the next 1-3 weeks.  Call your gastroenterologist if you have not heard about the biopsies in 3 weeks.  Our staff will call the home number listed on your records  the next business day following your procedure to check on you and address any questions or concerns that you may have at that time regarding the information given to you following your procedure. This is a courtesy call and so if there is no answer at the home number and we have not heard from you through the emergency physician on call, we will assume that you have returned to your regular daily activities without incident.  SIGNATURES/CONFIDENTIALITY: You and/or your care partner have signed paperwork which will be entered into your  electronic medical record.  These signatures attest to the fact that that the information above on your After Visit Summary has been reviewed and is understood.  Full responsibility of the confidentiality of this discharge information lies with you and/or your care-partner.

## 2012-12-12 NOTE — Op Note (Signed)
[facility] [address] [place], [address]   COLONOSCOPY PROCEDURE REPORT  PATIENT: Latoya [name], Latoya [name].  MR#: [id number] BIRTHDATE: [birth year] , 61  yrs. old GENDER: Female ENDOSCOPIST: Iva Boop, MD, [facility] PROCEDURE DATE:  12/12/2012 PROCEDURE:   Colonoscopy with snare polypectomy ASA CLASS:   Class II INDICATIONS:average risk screening. MEDICATIONS: There was residual sedation effect present from prior procedure, propofol (Diprivan) 100mg  IV, MAC sedation, administered by CRNA, and These medications were titrated to patient response per physician's verbal order  DESCRIPTION OF PROCEDURE:   After the risks benefits and alternatives of the procedure were thoroughly explained, informed consent was obtained.  A digital rectal exam revealed no abnormalities of the rectum.   The LB CF-H180AL [id number]  endoscope was introduced through the anus and advanced to the cecum, which was identified by both the appendix and ileocecal valve. No adverse events experienced.   The quality of the prep was Suprep good  The instrument was then slowly withdrawn as the colon was fully examined.      COLON FINDINGS: A diminutive polypoid shaped sessile polyp was found in the distal sigmoid colon.  A polypectomy was performed with a cold snare.  The resection was complete and the polyp tissue was completely retrieved.   Small internal and external hemorrhoids were found.   The colon mucosa was otherwise normal.   A right colon retroflexion was performed.  Retroflexed views revealed internal/external hemorrhoids. The time to cecum=2 minutes 0 seconds.  Withdrawal time=7 minutes 05 seconds.  The scope was withdrawn and the procedure completed. COMPLICATIONS: There were no complications.  ENDOSCOPIC IMPRESSION: 1.   Diminutive sessile polyp was found in the distal sigmoid colon; polypectomy was performed with a cold snare 2.   Small internal and external hemorrhoids 3.   Normal  colonoscopy otherwise - good prep  RECOMMENDATIONS: Timing of repeat colonoscopy will be determined by pathology findings.   eSigned:  Iva Boop, MD, [facility] 12/12/2012 9:05 AM  cc: Lucky Cowboy, MD and The Patient

## 2012-12-12 NOTE — Progress Notes (Signed)
Patient did not experience any of the following events: a burn prior to discharge; a fall within the facility; wrong site/side/patient/procedure/implant event; or a hospital transfer or hospital admission upon discharge from the facility. (G8907) Patient did not have preoperative order for IV antibiotic SSI prophylaxis. (G8918)  

## 2012-12-13 ENCOUNTER — Telehealth: Payer: Self-pay | Admitting: *Deleted

## 2012-12-13 NOTE — Telephone Encounter (Signed)
  Follow up Call-  Call back number 12/12/2012  Post procedure Call Back phone  # [phone]  Permission to leave phone message Yes     Patient questions:  Do you have a fever, pain , or abdominal swelling? no Pain Score  0 *  Have you tolerated food without any problems? yes  Have you been able to return to your normal activities? yes  Do you have any questions about your discharge instructions: Diet   no Medications  no Follow up visit  no  Do you have questions or concerns about your Care? no  Actions: * If pain score is 4 or above: No action needed, pain <4.

## 2012-12-17 NOTE — Progress Notes (Signed)
Quick Note:  Esophageal bxs ok Colon polyp not pre-cancerous  1) needs to add OTC Zegerid at bedtime to see if that helps 2) see me in 6 weeks 3) reopeat colonoscopy 10 yrs - [facility] to enter - no letter ______

## 2012-12-31 ENCOUNTER — Other Ambulatory Visit: Payer: Self-pay

## 2012-12-31 DIAGNOSIS — K219 Gastro-esophageal reflux disease without esophagitis: Secondary | ICD-10-CM

## 2012-12-31 MED ORDER — PANTOPRAZOLE SODIUM 40 MG PO TBEC: 40.0000 mg | DELAYED_RELEASE_TABLET | Freq: Every day | ORAL | Status: DC

## 2013-01-21 ENCOUNTER — Encounter: Payer: Self-pay | Admitting: Internal Medicine

## 2013-01-21 ENCOUNTER — Ambulatory Visit (INDEPENDENT_AMBULATORY_CARE_PROVIDER_SITE_OTHER): Payer: No Typology Code available for payment source | Admitting: Internal Medicine

## 2013-01-21 VITALS — BP 104/70 | HR 59 | Ht 64.0 in | Wt 157.1 lb

## 2013-01-21 DIAGNOSIS — R1031 Right lower quadrant pain: Secondary | ICD-10-CM

## 2013-01-21 DIAGNOSIS — R1030 Lower abdominal pain, unspecified: Secondary | ICD-10-CM

## 2013-01-21 DIAGNOSIS — K219 Gastro-esophageal reflux disease without esophagitis: Secondary | ICD-10-CM

## 2013-01-21 DIAGNOSIS — R109 Unspecified abdominal pain: Secondary | ICD-10-CM

## 2013-01-21 NOTE — Progress Notes (Signed)
  Subjective:    Patient ID: Latoya [name], female    DOB: Sep 15, 1950, 62 y.o.   MRN: [id number]  HPI For f/u GERD. Doing well on pantoprazole 40 mg q AM - no sxs. Stopped OTC Zegerid at hs as feet were swelling - ? "too much sodium". Also w/ RLQ soreness and pain - "I sleep on my right side". She did lift a heavy object while gardening recently also. Pain occurs w/ twsiting, bending and coughing.  Recent EGD - ? Mild esophagitis but biopsies were ok Small hyperplastic polyp, hemorrhoids on colonoscopy Medications, allergies, past medical history, past surgical history, family history and social history are reviewed and updated in the EMR.  Review of Systems As above    Objective:   Physical Exam General:  NAD Abdomen:  soft with mild RLQ and suprapubic tenderness increased with muscle tension - no hernia Ext:   no edema     Assessment & Plan:  GERD (gastroesophageal reflux disease)   Continue daily PPI, reflux precautions and f/u PCP - here as needed  Abdominal wall pain in right lower quadrant Abdominal wall pain in suprapubic region   Sportscreme w/ salicylate to abdominal wall areas x 2-4 weeks, (OTC) and brace abdominal wall when coughing, snezzing, etc  F/u prn, should improve w/ time and these measures  I appreciate the opportunity to care for this patient.    Medication List       These changes are accurate as of: 01/21/2013  9:09 AM. If you have any questions, ask your nurse or doctor.                                TAKE these medications       aspirin 81 MG chewable tablet  Chew 81 mg by mouth daily.     Biotin 1000 MCG tablet  Take 1,000 mcg by mouth 3 (three) times daily.     bumetanide 2 MG tablet  Commonly known as:  BUMEX  Take 2 mg by mouth daily.     cholecalciferol 1000 UNITS tablet  Commonly known as:  VITAMIN D  Take 1,000 Units by mouth daily.     citalopram 40 MG tablet  Commonly known as:  CELEXA  Take 40 mg by mouth daily.      clonazePAM 2 MG tablet  Commonly known as:  KLONOPIN  Take 1-2 mg by mouth 3 (three) times daily. Take half tablet twice a day and one tablet at night     diltiazem 120 MG 12 hr capsule  Commonly known as:  CARDIZEM SR  Take 120 mg by mouth 2 (two) times daily.     glucosamine-chondroitin 500-400 MG tablet  Take 1 tablet by mouth 3 (three) times daily.     Magnesium 250 MG Tabs  Take 1 tablet by mouth daily.     multivitamin with minerals Tabs  Take 1 tablet by mouth daily.     pantoprazole 40 MG tablet  Commonly known as:  PROTONIX  Take 1 tablet (40 mg total) by mouth daily before breakfast.     potassium chloride SA 20 MEQ tablet  Commonly known as:  K-DUR,KLOR-CON  Take 20 mEq by mouth 2 (two) times daily.     pravastatin 40 MG tablet  Commonly known as:  PRAVACHOL  Take 40 mg by mouth at bedtime.        WU:[id number],NWGNFAO DAVID, MD

## 2013-01-21 NOTE — Patient Instructions (Addendum)
Try using a sports cream over the counter for the next 2-4 weeks to see if that will help your abdominal wall pain.  Brace with a pillow when you cough.  Thank you for choosing me and [facility].  Iva Boop, M.D., [facility] : [facility]

## 2013-03-03 ENCOUNTER — Emergency Department (HOSPITAL_COMMUNITY)
Admission: EM | Admit: 2013-03-03 | Discharge: 2013-03-03 | Disposition: A | Discharge status: Home / Self Care | Payer: No Typology Code available for payment source | Attending: Emergency Medicine | Admitting: Emergency Medicine

## 2013-03-03 ENCOUNTER — Emergency Department (HOSPITAL_COMMUNITY): Payer: No Typology Code available for payment source

## 2013-03-03 ENCOUNTER — Encounter (HOSPITAL_COMMUNITY): Payer: Self-pay | Admitting: Emergency Medicine

## 2013-03-03 DIAGNOSIS — Z8719 Personal history of other diseases of the digestive system: Secondary | ICD-10-CM | POA: Insufficient documentation

## 2013-03-03 DIAGNOSIS — K259 Gastric ulcer, unspecified as acute or chronic, without hemorrhage or perforation: Secondary | ICD-10-CM | POA: Insufficient documentation

## 2013-03-03 DIAGNOSIS — Z860100 Personal history of colon polyps, unspecified: Secondary | ICD-10-CM | POA: Insufficient documentation

## 2013-03-03 DIAGNOSIS — Z87442 Personal history of urinary calculi: Secondary | ICD-10-CM | POA: Insufficient documentation

## 2013-03-03 DIAGNOSIS — I10 Essential (primary) hypertension: Secondary | ICD-10-CM | POA: Insufficient documentation

## 2013-03-03 DIAGNOSIS — R110 Nausea: Secondary | ICD-10-CM | POA: Insufficient documentation

## 2013-03-03 DIAGNOSIS — F329 Major depressive disorder, single episode, unspecified: Secondary | ICD-10-CM | POA: Insufficient documentation

## 2013-03-03 DIAGNOSIS — E785 Hyperlipidemia, unspecified: Secondary | ICD-10-CM | POA: Insufficient documentation

## 2013-03-03 DIAGNOSIS — M549 Dorsalgia, unspecified: Secondary | ICD-10-CM | POA: Insufficient documentation

## 2013-03-03 DIAGNOSIS — Z7982 Long term (current) use of aspirin: Secondary | ICD-10-CM | POA: Insufficient documentation

## 2013-03-03 DIAGNOSIS — K219 Gastro-esophageal reflux disease without esophagitis: Secondary | ICD-10-CM | POA: Insufficient documentation

## 2013-03-03 DIAGNOSIS — F411 Generalized anxiety disorder: Secondary | ICD-10-CM | POA: Insufficient documentation

## 2013-03-03 DIAGNOSIS — R109 Unspecified abdominal pain: Secondary | ICD-10-CM | POA: Insufficient documentation

## 2013-03-03 DIAGNOSIS — F410 Panic disorder [episodic paroxysmal anxiety] without agoraphobia: Secondary | ICD-10-CM | POA: Insufficient documentation

## 2013-03-03 DIAGNOSIS — Z79899 Other long term (current) drug therapy: Secondary | ICD-10-CM | POA: Insufficient documentation

## 2013-03-03 DIAGNOSIS — F3289 Other specified depressive episodes: Secondary | ICD-10-CM | POA: Insufficient documentation

## 2013-03-03 DIAGNOSIS — M129 Arthropathy, unspecified: Secondary | ICD-10-CM | POA: Insufficient documentation

## 2013-03-03 DIAGNOSIS — Z880 Allergy status to penicillin: Secondary | ICD-10-CM | POA: Insufficient documentation

## 2013-03-03 LAB — COMPREHENSIVE METABOLIC PANEL
ALT: 19 U/L (ref 0–35)
AST: 24 U/L (ref 0–37)
Albumin: 3.7 g/dL (ref 3.5–5.2)
Alkaline Phosphatase: 120 U/L — ABNORMAL HIGH (ref 39–117)
BUN: 12 mg/dL (ref 6–23)
CO2: 24 mEq/L (ref 19–32)
Calcium: 9.1 mg/dL (ref 8.4–10.5)
Chloride: 102 mEq/L (ref 96–112)
Creatinine, Ser: 1.04 mg/dL (ref 0.50–1.10)
GFR calc Af Amer: 65 mL/min — ABNORMAL LOW (ref >90)
GFR calc non Af Amer: 56 mL/min — ABNORMAL LOW (ref >90)
Glucose, Bld: 116 mg/dL — ABNORMAL HIGH (ref 70–99)
Potassium: 3.5 mEq/L (ref 3.5–5.1)
Sodium: 139 mEq/L (ref 135–145)
Total Bilirubin: 0.2 mg/dL — ABNORMAL LOW (ref 0.3–1.2)
Total Protein: 6.9 g/dL (ref 6.0–8.3)

## 2013-03-03 LAB — CBC WITH DIFFERENTIAL/PLATELET
Basophils Absolute: 0 10*3/uL (ref 0.0–0.1)
Basophils Relative: 0 % (ref 0–1)
Eosinophils Absolute: 0 10*3/uL (ref 0.0–0.7)
Eosinophils Relative: 0 % (ref 0–5)
HCT: 41.1 % (ref 36.0–46.0)
Hemoglobin: 13.8 g/dL (ref 12.0–15.0)
Lymphocytes Relative: 13 % (ref 12–46)
Lymphs Abs: 1.6 10*3/uL (ref 0.7–4.0)
MCH: 29.8 pg (ref 26.0–34.0)
MCHC: 33.6 g/dL (ref 30.0–36.0)
MCV: 88.8 fL (ref 78.0–100.0)
Monocytes Absolute: 1.5 10*3/uL — ABNORMAL HIGH (ref 0.1–1.0)
Monocytes Relative: 12 % (ref 3–12)
Neutro Abs: 9.1 10*3/uL — ABNORMAL HIGH (ref 1.7–7.7)
Neutrophils Relative %: 74 % (ref 43–77)
Platelets: 194 10*3/uL (ref 150–400)
RBC: 4.63 MIL/uL (ref 3.87–5.11)
RDW: 12.4 % (ref 11.5–15.5)
WBC: 12.2 10*3/uL — ABNORMAL HIGH (ref 4.0–10.5)

## 2013-03-03 LAB — LIPASE, BLOOD: Lipase: 49 U/L (ref 11–59)

## 2013-03-03 MED ORDER — ONDANSETRON HCL 4 MG/2ML IJ SOLN
4.0000 mg | Freq: Once | INTRAMUSCULAR | Status: AC
  Administered 2013-03-03: 4 mg via INTRAVENOUS
  Filled 2013-03-03: qty 2

## 2013-03-03 MED ORDER — IOHEXOL 300 MG/ML  SOLN
80.0000 mL | Freq: Once | INTRAMUSCULAR | Status: AC | PRN
  Administered 2013-03-03: 80 mL via INTRAVENOUS

## 2013-03-03 MED ORDER — IOHEXOL 300 MG/ML  SOLN
50.0000 mL | Freq: Once | INTRAMUSCULAR | Status: AC | PRN
  Administered 2013-03-03: 50 mL via ORAL

## 2013-03-03 MED ORDER — MORPHINE SULFATE 4 MG/ML IJ SOLN
4.0000 mg | Freq: Once | INTRAMUSCULAR | Status: AC
  Administered 2013-03-03: 4 mg via INTRAVENOUS
  Filled 2013-03-03: qty 1

## 2013-03-03 MED ORDER — SODIUM CHLORIDE 0.9 % IV SOLN
Freq: Once | INTRAVENOUS | Status: AC
  Administered 2013-03-03: 20:00:00 via INTRAVENOUS

## 2013-03-03 MED ORDER — TRAMADOL HCL 50 MG PO TABS: 50.0000 mg | ORAL_TABLET | Freq: Four times a day (QID) | ORAL | Status: DC | PRN

## 2013-03-03 NOTE — ED Provider Notes (Signed)
Medical screening examination/treatment/procedure(s) were performed by non-physician practitioner and as supervising physician I was immediately available for consultation/collaboration.   Gilda Crease, MD 03/03/13 2237

## 2013-03-03 NOTE — ED Notes (Signed)
Patient transported to CT 

## 2013-03-03 NOTE — ED Notes (Signed)
Pt returned from CT °

## 2013-03-03 NOTE — ED Provider Notes (Signed)
History    CSN: [id number] Arrival date & time 03/03/13  1745  First MD Initiated Contact with Patient 03/03/13 1953     Chief Complaint  Patient presents with  . Abdominal Pain    Right lower quadrant pain and nausea    (Consider location/radiation/quality/duration/timing/severity/associated sxs/prior Treatment) HPI Comments: Patient has had right lower quadrant abdominal pain for a month.  She was seen by Dr. Leone Payor, who felt it was muscular.  He prescribed a topical ointment, which the patient.  Did not use she states the pain got better, but 4 days ago, returned only worse radiates to her right flank denies fever, constipation, dysuria, hematuria, vomiting, states she has slight nausea intermittently.  She's taken Tylenol x1, for discomfort, without any relief.  She states she was seen by Dr. Tyron Russell as prior to arrival in the emergency department, who recommend she comes for further evaluation.  Patient had an appendectomy, to her C-section many years ago.  No other abdominal surgery  Patient is a 62 y.o. female presenting with abdominal pain. The history is provided by the patient.  Abdominal Pain This is a recurrent problem. The current episode started in the past 7 days. The problem occurs constantly. The problem has been gradually worsening. Associated symptoms include abdominal pain and nausea. Pertinent negatives include no anorexia, change in bowel habit, chest pain, congestion, coughing, fever, headaches, rash, urinary symptoms, vomiting or weakness. The symptoms are aggravated by exertion. She has tried acetaminophen for the symptoms. The treatment provided no relief.   Past Medical History  Diagnosis Date  . Hypertension   . Hyperlipemia   . Anxiety and depression   . Arthritis   . Colon polyps   . Hemorrhoids   . GERD (gastroesophageal reflux disease)   . Panic disorder   . Kidney stones   . Stomach ulcer    Past Surgical History  Procedure Laterality Date  .  Abdominal hysterectomy    . Tubal ligation    . Foot surgery    . Colonoscopy    . Upper gastrointestinal endoscopy    . Tonsillectomy    . Oophorectomy     Family History  Problem Relation Age of Onset  . Heart disease Brother     x2  . Stroke Brother   . Heart disease Father   . Asthma Mother    History  Substance Use Topics  . Smoking status: Never Smoker   . Smokeless tobacco: Never Used  . Alcohol Use: No   OB History   Grav Para Term Preterm Abortions TAB SAB Ect Mult Living                 Review of Systems  Constitutional: Negative for fever.  HENT: Negative for congestion and rhinorrhea.   Respiratory: Negative for cough and shortness of breath.   Cardiovascular: Negative for chest pain and leg swelling.  Gastrointestinal: Positive for nausea and abdominal pain. Negative for vomiting, diarrhea, constipation, abdominal distention, rectal pain, anorexia and change in bowel habit.  Genitourinary: Negative for dysuria, frequency, decreased urine volume, vaginal bleeding and vaginal discharge.  Musculoskeletal: Positive for back pain.  Skin: Negative for rash.  Neurological: Negative for dizziness, weakness and headaches.  All other systems reviewed and are negative.    Allergies  Other; Vioxx; Levaquin; Penicillins; Sulfa antibiotics; and Trovan  Home Medications   Current Outpatient Rx  Name  Route  Sig  Dispense  Refill  . aspirin 81 MG chewable tablet  Oral   Chew 81 mg by mouth daily.         . Biotin 1000 MCG tablet   Oral   Take 1,000 mcg by mouth 3 (three) times daily.         . bumetanide (BUMEX) 2 MG tablet   Oral   Take 2 mg by mouth daily.         . cholecalciferol (VITAMIN D) 1000 UNITS tablet   Oral   Take 1,000 Units by mouth daily.         . citalopram (CELEXA) 40 MG tablet   Oral   Take 40 mg by mouth daily.         . clonazePAM (KLONOPIN) 2 MG tablet   Oral   Take 1-2 mg by mouth 3 (three) times daily. Take half  tablet twice a day and one tablet at night         . diltiazem (CARDIZEM SR) 120 MG 12 hr capsule   Oral   Take 120 mg by mouth 2 (two) times daily.         Marland Kitchen glucosamine-chondroitin 500-400 MG tablet   Oral   Take 1 tablet by mouth 3 (three) times daily.         . Magnesium 250 MG TABS   Oral   Take 1 tablet by mouth daily.         . Multiple Vitamin (MULTIVITAMIN WITH MINERALS) TABS   Oral   Take 1 tablet by mouth daily.         . pantoprazole (PROTONIX) 40 MG tablet   Oral   Take 1 tablet (40 mg total) by mouth daily before breakfast.   90 tablet   3   . potassium chloride SA (K-DUR,KLOR-CON) 20 MEQ tablet   Oral   Take 20 mEq by mouth 2 (two) times daily.         . pravastatin (PRAVACHOL) 40 MG tablet   Oral   Take 40 mg by mouth at bedtime.          BP 115/64  Pulse 95  Temp(Src) 99.7 F (37.6 C) (Oral)  Resp 16  Wt 159 lb 8 oz (72.349 kg)  BMI 27.36 kg/m2  SpO2 96% Physical Exam  Nursing note and vitals reviewed. Constitutional: She is oriented to person, place, and time. She appears well-developed and well-nourished.  Eyes: Pupils are equal, round, and reactive to light.  Neck: Normal range of motion.  Cardiovascular: Normal rate.   Pulmonary/Chest: Effort normal.  Abdominal: Soft. She exhibits no distension. There is no hepatosplenomegaly. There is tenderness in the right lower quadrant and left lower quadrant. There is no rebound, no guarding and no CVA tenderness.    Musculoskeletal: Normal range of motion.  Neurological: She is alert and oriented to person, place, and time.  Skin: Skin is warm.    ED Course  Procedures (including critical care time) Labs Reviewed  URINALYSIS, ROUTINE W REFLEX MICROSCOPIC - Abnormal; Notable for the following:    Color, Urine GREEN (*)    All other components within normal limits  CBC WITH DIFFERENTIAL - Abnormal; Notable for the following:    WBC 12.2 (*)    Neutro Abs 9.1 (*)    Monocytes  Absolute 1.5 (*)    All other components within normal limits  COMPREHENSIVE METABOLIC PANEL - Abnormal; Notable for the following:    Glucose, Bld 116 (*)    Alkaline Phosphatase 120 (*)    Total  Bilirubin 0.2 (*)    GFR calc non Af Amer 56 (*)    GFR calc Af Amer 65 (*)    All other components within normal limits  LIPASE, BLOOD   Ct Abdomen Pelvis W Contrast  03/03/2013   *RADIOLOGY REPORT*  Clinical Data: Quadrant pain, elevated white blood cell count  CT ABDOMEN AND PELVIS WITH CONTRAST  Technique:  Multidetector CT imaging of the abdomen and pelvis was performed following the standard protocol during bolus administration of intravenous contrast.  Contrast: 80mL OMNIPAQUE IOHEXOL 300 MG/ML  SOLN  Comparison: Prior CT from 07/24/2011  Findings: The visualized lungs are clear.  Any hypodensity near the hepatic dome is unchanged, to spine characterized by CT, but likely a cyst.  The liver is otherwise unremarkable. The gallbladder, spleen, adrenal glands, pancreas, and kidneys demonstrate a normal contrast enhanced appearance.  The visualized small and large bowel are unremarkable without evidence of obstruction.  Scattered diverticula present without evidence of acute diverticulitis.  No wall thickening or abnormal mucosal enhancement is seen.  The appendix is not visualized, consistent with history of prior appendectomy.  The uterus is absent and not visualized.  The bladder is unremarkable.  The ovaries are not doubtful is seen.  There is no free air or free fluid.  No pathologically enlarged lymph nodes are seen within the abdomen or pelvis.  Hiatal hernia is noted.  No acute osseous abnormalities are identified.  Prominent osteophytic spurring is seen at the L2-3 level anteriorly.  IMPRESSION: 1.  No CT evidence of acute intra-abdominal process.  2. Hiatal hernia.   Original Report Authenticated By: Rise Mu, M.D.   1. Abdominal wall pain     MDM  CT scan, is negative for any  pathology.  In reviewing patient's chart, Dr. Leone Payor, saw this patient on may 20th recommended.  She use a salicylate, sports cream to the abdominal wall.  The next 2-4 weeks I again , weill recommend this treatment  Arman Filter, NP 03/03/13 2226

## 2013-03-03 NOTE — ED Notes (Signed)
Pt ambulated to bathroom with [place] NT

## 2013-03-03 NOTE — ED Notes (Signed)
Pt reports right lower quadrant pain x 4 days. Occasional nausea, denies vomiting. Referred by Dr Shelbie Proctor 30 minutes ago.

## 2013-03-04 LAB — URINALYSIS, ROUTINE W REFLEX MICROSCOPIC
Bilirubin Urine: NEGATIVE
Glucose, UA: NEGATIVE mg/dL
Hgb urine dipstick: NEGATIVE
Ketones, ur: NEGATIVE mg/dL
Leukocytes, UA: NEGATIVE
Nitrite: NEGATIVE
Protein, ur: NEGATIVE mg/dL
Specific Gravity, Urine: 1.014 (ref 1.005–1.030)
Urobilinogen, UA: 0.2 mg/dL (ref 0.0–1.0)
pH: 5 (ref 5.0–8.0)

## 2013-03-28 ENCOUNTER — Telehealth: Payer: Self-pay | Admitting: Gastroenterology

## 2013-03-28 MED ORDER — SUCRALFATE 1 G PO TABS: 1.0000 g | ORAL_TABLET | Freq: Four times a day (QID) | ORAL | Status: DC

## 2013-03-28 NOTE — Telephone Encounter (Signed)
Patient would like an alternate PPI, pantoprazole is not helping her refulx.  Please advise

## 2013-03-28 NOTE — Telephone Encounter (Signed)
She has failed multiple ppi's or they have failed her  Sucralfate 1 gram qid #120 1 refill REV before more refills

## 2013-03-28 NOTE — Telephone Encounter (Signed)
Patient advised REV scheduled for 04/25/13 1:30

## 2013-04-25 ENCOUNTER — Ambulatory Visit: Payer: No Typology Code available for payment source | Admitting: Internal Medicine

## 2013-06-22 ENCOUNTER — Encounter: Payer: Self-pay | Admitting: Internal Medicine

## 2013-06-30 ENCOUNTER — Other Ambulatory Visit: Payer: Self-pay | Admitting: Internal Medicine

## 2013-06-30 DIAGNOSIS — N959 Unspecified menopausal and perimenopausal disorder: Secondary | ICD-10-CM

## 2013-07-10 ENCOUNTER — Ambulatory Visit: Payer: Self-pay | Admitting: Emergency Medicine

## 2013-07-10 ENCOUNTER — Encounter: Payer: Self-pay | Admitting: Emergency Medicine

## 2013-07-10 VITALS — BP 122/70 | HR 64 | Temp 98.2°F | Resp 20 | Ht 65.0 in | Wt 161.0 lb

## 2013-07-10 DIAGNOSIS — R7309 Other abnormal glucose: Secondary | ICD-10-CM

## 2013-07-10 DIAGNOSIS — F32A Depression, unspecified: Secondary | ICD-10-CM

## 2013-07-10 DIAGNOSIS — I10 Essential (primary) hypertension: Secondary | ICD-10-CM

## 2013-07-10 DIAGNOSIS — F329 Major depressive disorder, single episode, unspecified: Secondary | ICD-10-CM

## 2013-07-10 DIAGNOSIS — F411 Generalized anxiety disorder: Secondary | ICD-10-CM

## 2013-07-10 DIAGNOSIS — E782 Mixed hyperlipidemia: Secondary | ICD-10-CM

## 2013-07-10 DIAGNOSIS — J209 Acute bronchitis, unspecified: Secondary | ICD-10-CM

## 2013-07-10 DIAGNOSIS — J309 Allergic rhinitis, unspecified: Secondary | ICD-10-CM

## 2013-07-10 MED ORDER — BENZONATATE 100 MG PO CAPS: 100.0000 mg | ORAL_CAPSULE | Freq: Three times a day (TID) | ORAL | Status: DC | PRN

## 2013-07-10 MED ORDER — DEXAMETHASONE SODIUM PHOSPHATE 10 MG/ML IJ SOLN: 10.0000 mg | Freq: Once | INTRAMUSCULAR | Status: DC

## 2013-07-10 MED ORDER — CITALOPRAM HYDROBROMIDE 40 MG PO TABS: 40.0000 mg | ORAL_TABLET | Freq: Every day | ORAL | Status: DC

## 2013-07-10 MED ORDER — ALBUTEROL SULFATE HFA 108 (90 BASE) MCG/ACT IN AERS: 2.0000 | INHALATION_SPRAY | Freq: Four times a day (QID) | RESPIRATORY_TRACT | Status: DC | PRN

## 2013-07-10 MED ORDER — DOXYCYCLINE HYCLATE 100 MG PO TABS: 100.0000 mg | ORAL_TABLET | Freq: Two times a day (BID) | ORAL | Status: DC

## 2013-07-10 MED ORDER — DEXAMETHASONE SODIUM PHOSPHATE 100 MG/10ML IJ SOLN
10.0000 mg | Freq: Once | INTRAMUSCULAR | Status: AC
  Administered 2013-07-10: 10 mg via INTRAMUSCULAR

## 2013-07-10 NOTE — Patient Instructions (Signed)
Bronchitis Bronchitis is a problem of the air tubes leading to your lungs. This problem makes it hard for air to get in and out of the lungs. You may cough a lot because your air tubes are narrow. Going without care can cause lasting (chronic) bronchitis. HOME CARE   Drink enough fluids to keep your pee (urine) clear or pale yellow.  Use a cool mist humidifier.  Quit smoking if you smoke. If you keep smoking, the bronchitis might not get better.  Only take medicine as told by your doctor. GET HELP RIGHT AWAY IF:   Coughing keeps you awake.  You start to wheeze.  You become more sick or weak.  You have a hard time breathing or get short of breath.  You cough up blood.  Coughing lasts more than 2 weeks.  You have a fever.  Your baby is older than 3 months with a rectal temperature of 102 F (38.9 C) or higher.  Your baby is 70 months old or younger with a rectal temperature of 100.4 F (38 C) or higher. MAKE SURE YOU:  Understand these instructions.  Will watch your condition.  Will get help right away if you are not doing well or get worse. Document Released: 02/07/2008 Document Revised: 11/13/2011 Document Reviewed: 04/15/2013 [facility] Patient Information 2014 [place], [place]. Depression, Adult Depression is feeling sad, low, down in the dumps, blue, gloomy, or empty. In general, there are two kinds of depression:  Normal sadness or grief. This can happen after something upsetting. It often goes away on its own within 2 weeks. After losing a loved one (bereavement), normal sadness and grief may last longer than two weeks. It usually gets better with time.  Clinical depression. This kind lasts longer than normal sadness or grief. It keeps you from doing the things you normally do in life. It is often hard to function at home, work, or at school. It may affect your relationships with others. Treatment is often needed. GET HELP RIGHT AWAY IF:  You have thoughts about  hurting yourself or others.  You lose touch with reality (psychotic symptoms). You may:  See or hear things that are not real.  Have untrue beliefs about your life or people around you.  Your medicine is giving you problems. MAKE SURE YOU:  Understand these instructions.  Will watch your condition.  Will get help right away if you are not doing well or get worse. Document Released: 09/23/2010 Document Revised: 05/15/2012 Document Reviewed: 12/21/2011 [facility] Patient Information 2014 [place], [place].

## 2013-07-11 ENCOUNTER — Ambulatory Visit (HOSPITAL_COMMUNITY): Payer: No Typology Code available for payment source

## 2013-07-11 ENCOUNTER — Telehealth: Payer: Self-pay | Admitting: Emergency Medicine

## 2013-07-11 DIAGNOSIS — F411 Generalized anxiety disorder: Secondary | ICD-10-CM | POA: Insufficient documentation

## 2013-07-11 NOTE — Telephone Encounter (Signed)
Patient advised if any SI/ HI needs to go to ER immediately.

## 2013-07-11 NOTE — Progress Notes (Signed)
Subjective:    Patient ID: Latoya [name], female    DOB: 18-Jul-1951, 62 y.o.   MRN: [id number]  HPI Comments: 62 yo female comes in for mole removal and recheck of labs with Anxiety/ mood prescription change. Patient did not want to stay on Zyprexa due to cost and made her feel worse, Brintellix worked with depression but made agitation worse and insurance will not [employer], and admits to "not caring and wants to just put an end to it, but won't".    Current Outpatient Prescriptions on File Prior to Visit  Medication Sig Dispense Refill  . aspirin 81 MG chewable tablet Chew 81 mg by mouth daily.      . Biotin 1000 MCG tablet Take 1,000 mcg by mouth 3 (three) times daily.      . bumetanide (BUMEX) 2 MG tablet Take 2 mg by mouth daily.      . cholecalciferol (VITAMIN D) 1000 UNITS tablet Take 1,000 Units by mouth daily.      . clonazePAM (KLONOPIN) 2 MG tablet Take 1-2 mg by mouth 3 (three) times daily. Take half tablet twice a day and one tablet at night      . diltiazem (CARDIZEM SR) 120 MG 12 hr capsule Take 120 mg by mouth 2 (two) times daily.      Marland Kitchen glucosamine-chondroitin 500-400 MG tablet Take 1 tablet by mouth 3 (three) times daily.      . Magnesium 250 MG TABS Take 1 tablet by mouth daily.      . Multiple Vitamin (MULTIVITAMIN WITH MINERALS) TABS Take 1 tablet by mouth daily.      . pantoprazole (PROTONIX) 40 MG tablet Take 1 tablet (40 mg total) by mouth daily before breakfast.  90 tablet  3  . potassium chloride SA (K-DUR,KLOR-CON) 20 MEQ tablet Take 20 mEq by mouth 2 (two) times daily.      . pravastatin (PRAVACHOL) 40 MG tablet Take 40 mg by mouth at bedtime.      . sucralfate (CARAFATE) 1 G tablet Take 1 tablet (1 g total) by mouth 4 (four) times daily.  120 tablet  1  . traMADol (ULTRAM) 50 MG tablet Take 1 tablet (50 mg total) by mouth every 6 (six) hours as needed for pain.  15 tablet  0   No current facility-administered medications on file prior to visit.  ALLERGIES Other;  Prednisone; Vioxx; Entex t; Levaquin; Penicillins; Sulfa antibiotics; and Trovan  Past Medical History  Diagnosis Date  . Hypertension   . Hyperlipemia   . Anxiety and depression   . Arthritis   . Colon polyps   . Hemorrhoids   . GERD (gastroesophageal reflux disease)   . Panic disorder   . Kidney stones   . Stomach ulcer     Review of Systems  Constitutional: Negative.   Respiratory: Negative.   Cardiovascular: Negative.   Skin: Positive for color change.  Neurological: Negative.   Psychiatric/Behavioral: Positive for suicidal ideas, behavioral problems and agitation. The patient is nervous/anxious.    BP 122/70  Pulse 64  Temp(Src) 98.2 F (36.8 C) (Temporal)  Resp 20  Ht 5\' 5"  (1.651 m)  Wt 161 lb (73.029 kg)  BMI 26.79 kg/m2     Objective:   Physical Exam  Nursing note and vitals reviewed. Constitutional: She is oriented to person, place, and time. She appears well-developed and well-nourished.  Eyes: Pupils are equal, round, and reactive to light.  Neck: Normal range of motion.  Cardiovascular:  Normal rate, regular rhythm, normal heart sounds and intact distal pulses.   Pulmonary/Chest: Effort normal and breath sounds normal.  Abdominal: Soft.  Musculoskeletal: Normal range of motion.  Neurological: She is alert and oriented to person, place, and time.  Skin: Skin is warm.  LLQ abdomen and mid/ upper right back with flat dark irreg border, approx 3mm each  Psychiatric:  + agitation, Not reasoning well, getting distracted easily          Assessment & Plan:  Derpression/ aggitation advised to D/C Brintellix/ Zyprexa and to restart Celexa 40mg , advised if any SI/ HI needs ER evaluation. Patient refuses hospital eval at this time. Long discussion about need for better control, counseling, and hospitalization. Patient will reschedule labs and mole removal x2 due to cost and insurance plan changes. Advised neds to be in the near future due to concern of  Melanoma.

## 2013-07-11 NOTE — Telephone Encounter (Signed)
Pt saw melissa yesterday=she cant remember what meds of the depression med you want her to take. She couldn't remember She wants to know today. Will find chart to send back.

## 2013-07-11 NOTE — Telephone Encounter (Signed)
Patient was advised per M. Lynne Righi and R. Helmer to take only Celexa 40mg  AD, D/C Brintellix and Zyprexa AD.

## 2013-07-11 NOTE — Telephone Encounter (Signed)
Pt sd rx you prescribed yesterday never was revd by pham. [facility] on [address] Rx: Citalopram 40mg  qd  Pt also request referral to Psychotrist?

## 2013-07-11 NOTE — Telephone Encounter (Signed)
Patient was advised by Lurena Joiner per Efraim Kaufmann to stop Zyprexa/ Brintellix and to only restart Celexa 40 mg, RX already called to pharmacy, advised if SI/HI needs ER.

## 2013-07-22 ENCOUNTER — Telehealth: Payer: Self-pay | Admitting: Emergency Medicine

## 2013-07-22 ENCOUNTER — Other Ambulatory Visit: Payer: Self-pay | Admitting: Emergency Medicine

## 2013-07-22 DIAGNOSIS — J209 Acute bronchitis, unspecified: Secondary | ICD-10-CM

## 2013-07-22 MED ORDER — ALBUTEROL SULFATE HFA 108 (90 BASE) MCG/ACT IN AERS: 2.0000 | INHALATION_SPRAY | Freq: Four times a day (QID) | RESPIRATORY_TRACT | Status: DC | PRN

## 2013-07-22 MED ORDER — AZITHROMYCIN 250 MG PO TABS: ORAL_TABLET | ORAL | Status: AC

## 2013-07-22 NOTE — Telephone Encounter (Signed)
Patient still not feeling well with cough, congestion and hoarseness, now chest tightness and SOB with cough. Will get CXR and send in zpak and albuterol inhaler. If SX increase ER.

## 2013-07-22 NOTE — Telephone Encounter (Signed)
PT STATES HAS HAD NO RELIEF FROM COUGHING PEARLS.  FINISHED ANTIBIOTIC STILL HOARSE, COUGHING, DOES NOT FEEL ANY BETTER, SHORT OF BREATH, TIRED. DRY COGH PLEASE ADVISE.

## 2013-07-23 ENCOUNTER — Ambulatory Visit (HOSPITAL_COMMUNITY)
Admission: RE | Admit: 2013-07-23 | Discharge: 2013-07-23 | Disposition: A | Discharge status: Home / Self Care | Payer: No Typology Code available for payment source | Source: Ambulatory Visit | Attending: Emergency Medicine | Admitting: Emergency Medicine

## 2013-07-23 DIAGNOSIS — J209 Acute bronchitis, unspecified: Secondary | ICD-10-CM | POA: Insufficient documentation

## 2013-07-23 DIAGNOSIS — R059 Cough, unspecified: Secondary | ICD-10-CM | POA: Insufficient documentation

## 2013-07-23 DIAGNOSIS — R05 Cough: Secondary | ICD-10-CM | POA: Insufficient documentation

## 2013-07-23 NOTE — Telephone Encounter (Signed)
Spoke with patient about concerns of cough, congestion and tightness in chest with SOB symptoms increasing.  Advised patient per Loree Fee, PA-C to get Chest Xray at [facility] and Zpak and Albuterol inhaler were escribed to pt's pharmacy.  Advised pt if symptoms increase to go to ER per Loree Fee, PA-C orders.

## 2013-08-05 ENCOUNTER — Encounter: Payer: Self-pay | Admitting: Internal Medicine

## 2013-08-05 ENCOUNTER — Ambulatory Visit (INDEPENDENT_AMBULATORY_CARE_PROVIDER_SITE_OTHER): Payer: No Typology Code available for payment source | Admitting: Internal Medicine

## 2013-08-05 VITALS — BP 120/86 | HR 68 | Ht 64.5 in | Wt 157.8 lb

## 2013-08-05 DIAGNOSIS — R109 Unspecified abdominal pain: Secondary | ICD-10-CM

## 2013-08-05 NOTE — Patient Instructions (Signed)
Please use sportscreme w/ salicylate to abdominal wall areas x 2-4 weeks, (OTC) and brace abdominal wall when coughing, sneezing, etc. Lift properly with back straight. You have abdominal wall muscle strain again.

## 2013-08-05 NOTE — Assessment & Plan Note (Signed)
This is a chronic recurrent problem. She had forgotten about using topical salicylate therapy but has some at home and will use that again. She can also use oral anti-inflammatories. We discussed how to lift properly, using a heating pad of needed and to try to brace her abdominal wall when coughing and sneezing etc. She will return as needed, she is reassured. Given all of the investigation she's had in the past year, I feel very comfortable with this diagnosis and would not pursue other testing.

## 2013-08-05 NOTE — Progress Notes (Signed)
   Subjective:    Patient ID: Latoya [name], female    DOB: 08/27/1951, 62 y.o.   MRN: [id number]  HPI Patient is here with her husband again. She is having recurrent upper abdominal pain in the right and left upper quadrants and epigastrium. It's probably related to movement. She does not remember an injury. Is not related to eating, she is defecating in urinating without difficulty. Medications, allergies, past medical history, past surgical history, family history and social history are reviewed and updated in the EMR.   Review of Systems As above, she is decided to stop her antidepressants. "I got off all of them". She denies any depression problems right now.    Objective:   Physical Exam General:  NAD Eyes:   anicteric Abdomen:  soft and tender in the epigastrium especially, really with muscle tension. The xiphoid is not tender., BS+  Data Reviewed:  This year she's had an EGD, CT scan of the abdomen and pelvis and abdominal ultrasound all of which were really unrevealing. I reviewed her November primary care note as well.    Assessment & Plan:   1. Abdominal wall pain

## 2013-08-22 ENCOUNTER — Other Ambulatory Visit: Payer: Self-pay | Admitting: Emergency Medicine

## 2013-08-22 DIAGNOSIS — M8580 Other specified disorders of bone density and structure, unspecified site: Secondary | ICD-10-CM

## 2013-08-26 ENCOUNTER — Ambulatory Visit (HOSPITAL_COMMUNITY)
Admission: RE | Admit: 2013-08-26 | Discharge: 2013-08-26 | Disposition: A | Discharge status: Home / Self Care | Payer: No Typology Code available for payment source | Source: Ambulatory Visit | Attending: Internal Medicine | Admitting: Internal Medicine

## 2013-08-26 DIAGNOSIS — Z13820 Encounter for screening for osteoporosis: Secondary | ICD-10-CM | POA: Insufficient documentation

## 2013-08-26 DIAGNOSIS — N959 Unspecified menopausal and perimenopausal disorder: Secondary | ICD-10-CM

## 2013-08-26 DIAGNOSIS — Z780 Asymptomatic menopausal state: Secondary | ICD-10-CM | POA: Insufficient documentation

## 2013-09-03 ENCOUNTER — Other Ambulatory Visit: Payer: Self-pay | Admitting: Internal Medicine

## 2013-09-03 DIAGNOSIS — Z1231 Encounter for screening mammogram for malignant neoplasm of breast: Secondary | ICD-10-CM

## 2013-09-12 ENCOUNTER — Other Ambulatory Visit: Payer: Self-pay | Admitting: Internal Medicine

## 2013-09-16 ENCOUNTER — Other Ambulatory Visit: Payer: Self-pay | Admitting: Emergency Medicine

## 2013-09-16 DIAGNOSIS — R059 Cough, unspecified: Secondary | ICD-10-CM

## 2013-09-16 DIAGNOSIS — R05 Cough: Secondary | ICD-10-CM

## 2013-09-17 ENCOUNTER — Other Ambulatory Visit: Payer: Self-pay | Admitting: Emergency Medicine

## 2013-09-17 DIAGNOSIS — D229 Melanocytic nevi, unspecified: Secondary | ICD-10-CM

## 2013-09-18 ENCOUNTER — Encounter (INDEPENDENT_AMBULATORY_CARE_PROVIDER_SITE_OTHER): Payer: Self-pay

## 2013-09-18 ENCOUNTER — Ambulatory Visit (INDEPENDENT_AMBULATORY_CARE_PROVIDER_SITE_OTHER): Payer: 59 | Admitting: Internal Medicine

## 2013-09-18 ENCOUNTER — Encounter: Payer: Self-pay | Admitting: Internal Medicine

## 2013-09-18 VITALS — BP 130/72 | HR 87 | Temp 98.2°F | Ht 64.0 in | Wt 159.0 lb

## 2013-09-18 DIAGNOSIS — K219 Gastro-esophageal reflux disease without esophagitis: Secondary | ICD-10-CM

## 2013-09-18 DIAGNOSIS — R05 Cough: Secondary | ICD-10-CM

## 2013-09-18 DIAGNOSIS — R059 Cough, unspecified: Secondary | ICD-10-CM

## 2013-09-18 MED ORDER — PANTOPRAZOLE SODIUM 40 MG PO TBEC
DELAYED_RELEASE_TABLET | ORAL | Status: DC
Start: 2013-09-18 — End: 2014-05-25

## 2013-09-18 NOTE — Progress Notes (Signed)
   Subjective:    Patient ID: Latoya [name], female    DOB: 1950/11/09   MRN: [id number]  HPI  21 yowf never smoker with tendency of recurrent cough x 2010 referred 09/18/2013 to pulmonary clinic Dr Melford Aase  09/18/2013 [facility]/ Wert cc acute cough in Nov 2014 got better on mucinex and then worse since stopped it.  Problem is worse in evenings after supper and not flaring early in am, dry and croopy, maintained on ppi and carafate and using lots mint and menthol products to control it. Not better p albuterol  Not really sob unless coughing   No obvious other patterns in  day to day or daytime variabilty or assoc  cp or chest tightness, subjective wheeze overt sinus or hb symptoms. No unusual exp hx or h/o childhood pna/ asthma or knowledge of premature birth.  Sleeping ok without nocturnal  or early am exacerbation  of respiratory  c/o's or need for noct saba. Also denies any obvious fluctuation of symptoms with weather or environmental changes or other aggravating or alleviating factors except as outlined above   Current Medications, Allergies, Complete Past Medical History, Past Surgical History, Family History, and Social History were reviewed in [employer] record.           Review of Systems  Constitutional: Negative for fever, chills and unexpected weight change.  HENT: Positive for congestion and postnasal drip. Negative for dental problem, ear pain, nosebleeds, rhinorrhea, sinus pressure, sneezing, sore throat, trouble swallowing and voice change.   Eyes: Negative for visual disturbance.  Respiratory: Positive for cough and shortness of breath. Negative for choking.   Cardiovascular: Negative for chest pain and leg swelling.  Gastrointestinal: Negative for vomiting, abdominal pain and diarrhea.  Genitourinary: Negative for difficulty urinating.  Musculoskeletal: Positive for arthralgias.  Skin: Negative for rash.  Neurological:  Negative for tremors, syncope and headaches.  Hematological: Does not bruise/bleed easily.       Objective:   Physical Exam  Wt Readings from Last 3 Encounters:  09/18/13 159 lb (72.122 kg)  08/05/13 157 lb 12.8 oz (71.578 kg)  07/10/13 161 lb (73.029 kg)      amb animated hoarse wf nad  HEENT: nl dentition, turbinates, and orophanx. Nl external ear canals without cough reflex   NECK :  without JVD/Nodes/TM/ nl carotid upstrokes bilaterally   LUNGS: no acc muscle use, clear to A and P bilaterally without cough on insp or exp maneuvers   CV:  RRR  no s3 or murmur or increase in P2, no edema   ABD:  soft and nontender with nl excursion in the supine position. No bruits or organomegaly, bowel sounds nl  MS:  warm without deformities, calf tenderness, cyanosis or clubbing  SKIN: warm and dry without lesions    NEURO:  alert, approp, no deficits    cxr 07/23/13 No active cardiopulmonary disease. Chest is stable from 09/07/2011.  No evidence of pulmonary infiltrate.     Assessment & Plan:

## 2013-09-18 NOTE — Patient Instructions (Addendum)
[email or web address] best bet cash/ generic/quantity  When coughing for any reason: delsym 2 tsp every 12 hours  protonix (pantaprazole) 40 mg Take 30- 60 min before your first and last meals of the day then back to one daily when no cough and no need for delsym   GERD (REFLUX)  is an extremely common cause of respiratory symptoms, many times with no significant heartburn at all.    It can be treated with medication, but also with lifestyle changes including avoidance of late meals, excessive alcohol, smoking cessation, and avoid fatty foods, chocolate, peppermint, colas, red wine, and acidic juices such as orange juice.  NO MINT OR MENTHOL PRODUCTS SO NO COUGH DROPS  USE SUGARLESS CANDY INSTEAD (jolley ranchers or Stover's)  NO OIL BASED VITAMINS - use powdered substitutes.    Pulmonary follow up is as needed

## 2013-09-19 DIAGNOSIS — R05 Cough: Secondary | ICD-10-CM | POA: Insufficient documentation

## 2013-09-19 DIAGNOSIS — R053 Chronic cough: Secondary | ICD-10-CM | POA: Insufficient documentation

## 2013-09-19 NOTE — Assessment & Plan Note (Signed)
See EGD 12/12/12  See cough for rec to double the dose of ppi when coughing and maintain diet dietary restrictions

## 2013-09-19 NOTE — Assessment & Plan Note (Addendum)
The most common causes of chronic cough in immunocompetent adults include the following: upper airway cough syndrome (UACS), previously referred to as postnasal drip syndrome (PNDS), which is caused by variety of rhinosinus conditions; (2) asthma; (3) GERD; (4) chronic bronchitis from cigarette smoking or other inhaled environmental irritants; (5) nonasthmatic eosinophilic bronchitis; and (6) bronchiectasis.   These conditions, singly or in combination, have accounted for up to 94% of the causes of chronic cough in prospective studies.   Other conditions have constituted no >6% of the causes in prospective studies These have included bronchogenic carcinoma, chronic interstitial pneumonia, sarcoidosis, left ventricular failure, ACEI-induced cough, and aspiration from a condition associated with pharyngeal dysfunction.    Chronic cough is often simultaneously caused by more than one condition. A single cause has been found from 38 to 82% of the time, multiple causes from 18 to 62%. Multiply caused cough has been the result of three diseases up to 42% of the time.       She has documented esophagitis by EGD 12/12/12 and when she gets a cough for any reason is likely to develop cyclical cough.  Explained natural history of uri and why it's necessary in patients at risk to treat GERD aggressively  at least  short term   to reduce risk of evolving cyclical cough initially  triggered by epithelial injury and a heightened sensitivty to the effects of any upper airway irritants,  most importantly acid - related.  That is, the more sensitive the epithelium damaged for virus, the more the cough, the more the secondary reflux (especially in those prone to reflux) the more the irritation of the sensitive mucosa and so on in a cyclical pattern.   For now max gerd rx/ suppress cough emphasize approp diet to exclude mint and menthol products that promote non-acid gerd

## 2013-09-26 ENCOUNTER — Other Ambulatory Visit: Payer: Self-pay | Admitting: Emergency Medicine

## 2013-09-26 DIAGNOSIS — M79644 Pain in right finger(s): Secondary | ICD-10-CM

## 2013-10-06 ENCOUNTER — Ambulatory Visit (INDEPENDENT_AMBULATORY_CARE_PROVIDER_SITE_OTHER): Payer: 59 | Admitting: Emergency Medicine

## 2013-10-06 ENCOUNTER — Encounter: Payer: Self-pay | Admitting: Emergency Medicine

## 2013-10-06 VITALS — BP 122/96 | HR 68 | Temp 98.2°F | Resp 18 | Ht 65.0 in | Wt 155.0 lb

## 2013-10-06 DIAGNOSIS — R4586 Emotional lability: Secondary | ICD-10-CM

## 2013-10-06 DIAGNOSIS — R238 Other skin changes: Secondary | ICD-10-CM

## 2013-10-06 DIAGNOSIS — D239 Other benign neoplasm of skin, unspecified: Secondary | ICD-10-CM

## 2013-10-06 DIAGNOSIS — R239 Unspecified skin changes: Secondary | ICD-10-CM

## 2013-10-06 DIAGNOSIS — Q859 Phakomatosis, unspecified: Secondary | ICD-10-CM

## 2013-10-06 MED ORDER — CARBAMAZEPINE 200 MG PO TABS: 200.0000 mg | ORAL_TABLET | Freq: Three times a day (TID) | ORAL | Status: DC

## 2013-10-06 NOTE — Patient Instructions (Addendum)
Wound Care Wound care helps prevent pain and infection.  You may need a tetanus shot if:  You cannot remember when you had your last tetanus shot.  You have never had a tetanus shot.  The injury broke your skin. If you need a tetanus shot and you choose not to have one, you may get tetanus. Sickness from tetanus can be serious. HOME CARE   Only take medicine as told by your doctor.  Clean the wound daily with mild soap and water.  Change any bandages (dressings) as told by your doctor.  Put medicated cream and a bandage on the wound as told by your doctor.  Change the bandage if it gets wet, dirty, or starts to smell.  Take showers. Do not take baths, swim, or do anything that puts your wound under water.  Rest and raise (elevate) the wound until the pain and puffiness (swelling) are better.  Keep all doctor visits as told. GET HELP RIGHT AWAY IF:   Yellowish-white fluid (pus) comes from the wound.  Medicine does not lessen your pain.  There is a red streak going away from the wound.  You have a fever. MAKE SURE YOU:   Understand these instructions.  Will watch your condition.  Will get help right away if you are not doing well or get worse. Document Released: 05/30/2008 Document Revised: 11/13/2011 Document Reviewed: 12/25/2010 [facility] Patient Information 2014 [place], [place]. Mood Disorders TEGRETOL Start 1/2 pill once daily x 1 week, then increase to 1/2 pill twice daily x 1 week, then increase to 1/2 in a.m. And 1 whole in the p.m. X 1 week, then increase to 1 pill twice daily  Mood disorders are conditions that affect the way a person feels emotionally. The main mood disorders include:  Depression.  Bipolar disorder.  Dysthymia. Dysthymia is a mild, lasting (chronic) depression. Symptoms of dysthymia are similar to depression, but not as severe.  Cyclothymia. Cyclothymia includes mood swings, but the highs and lows are not as severe as they are in  bipolar disorder. Symptoms of cyclothymia are similar to those of bipolar disorder, but less extreme. CAUSES  Mood disorders are probably caused by a combination of factors. People with mood disorders seem to have physical and chemical changes in their brains. Mood disorders run in families, so there may be genetic causes. Severe trauma or stressful life events may also increase the risk of mood disorders.  SYMPTOMS  Symptoms of mood disorders depend on the specific type of condition. Depression symptoms include:  Feeling sad, worthless, or hopeless.  Negative thoughts.  Inability to enjoy one's usual activities.  Low energy.  Sleeping too much or too little.  Appetite changes.  Crying.  Concentration problems.  Thoughts of harming oneself. Bipolar disorder symptoms include:  Periods of depression (see above symptoms).  Mood swings, from sadness and depression, to abnormal elation and excitement.  Periods of mania:  Racing thoughts.  Fast speech.  Poor judgment, and careless, dangerous choices.  Decreased need for sleep.  Risky behavior.  Difficulty concentrating.  Irritability.  Increased energy.  Increased sex drive. DIAGNOSIS  There are no blood tests or X-rays that can confirm a mood disorder. However, your caregiver may choose to run some tests to make sure that there is not another physical cause for your symptoms. A mood disorder is usually diagnosed after an in-depth interview with a caregiver. TREATMENT  Mood disorders can be treated with one or more of the following:  Medicine. This may  include antidepressants, mood-stabilizers, or anti-psychotics.  Psychotherapy (talk therapy).  Cognitive behavioral therapy. You are taught to recognize negative thoughts and behavior patterns, and replace them with healthy thoughts and behaviors.  Electroconvulsive therapy. For very severe cases of deep depression, a series of treatments in which an electrical  current is applied to the brain.  Vagus nerve stimulation. A pulse of electricity is applied to a portion of the brain.  Transcranial magnetic stimulation. Powerful magnets are placed on the head that produce electrical currents.  Hospitalization. In severe situations, or when someone is having serious thoughts of harming him or herself, hospitalization may be necessary in order to keep the person safe. This is also done to quickly start and monitor treatment. HOME CARE INSTRUCTIONS   Take your medicine exactly as directed.  Attend all of your therapy sessions.  Try to eat regular, healthy meals.  Exercise daily. Exercise may improve mood symptoms.  Get good sleep.  Do not drink alcohol or use pot or other drugs. These can worsen mood symptoms and cause anxiety and psychosis.  Tell your caregiver if you develop any side effects, such as feeling sick to your stomach (nauseous), dry mouth, dizziness, constipation, drowsiness, tremor, weight gain, or sexual symptoms. He or she may suggest things you can do to improve symptoms.  Learn ways to cope with the stress of having a chronic illness. This includes yoga, meditation, tai chi, or participating in a support group.  Drink enough water to keep your urine clear or pale yellow. Eat a high-fiber diet. These habits may help you avoid constipation from your medicine. SEEK IMMEDIATE MEDICAL CARE IF:  Your mood worsens.  You have thoughts of hurting yourself or others.  You cannot care for yourself.  You develop the sensation of hearing or seeing something that is not actually present (auditory or visual hallucinations).  You develop abnormal thoughts. Document Released: 06/18/2009 Document Revised: 11/13/2011 Document Reviewed: 06/18/2009 [facility] Patient Information 2014 [place], [place].

## 2013-10-06 NOTE — Progress Notes (Signed)
Subjective:    Patient ID: Latoya [name], female    DOB: 1951/08/07, 63 y.o.   MRN: [id number]  HPI Comments: 63 yo female f/u for irregular nevi removal. She notes areas have increased in pigment and occasionally itch and she wants them removed. She notes one on abdomen and one on back of most concern out of her multiple moles.   She is having to take 1 Klonazepam a.m. And 1/2 at lunch and 1 p.m. She has had several trials of depression Rx without relief with symptoms. She feels she needs something to help with her mood swings/ agitation.  Current Outpatient Prescriptions on File Prior to Visit  Medication Sig Dispense Refill  . albuterol (PROVENTIL HFA;VENTOLIN HFA) 108 (90 BASE) MCG/ACT inhaler Inhale 2 puffs into the lungs every 6 (six) hours as needed for wheezing or shortness of breath.  1 Inhaler  2  . aspirin 81 MG tablet Take 81 mg by mouth daily.      . Biotin 1000 MCG tablet Take 1,000 mcg by mouth 3 (three) times daily.      . bumetanide (BUMEX) 2 MG tablet Take 2 mg by mouth daily.      . cholecalciferol (VITAMIN D) 1000 UNITS tablet Take 1,000 Units by mouth daily.      . clonazePAM (KLONOPIN) 2 MG tablet TAKE 1/2 TABLET BY MOUTH TWICE DAILY AND 1 TABLET BY MOUTH EVERY NIGHT AT BEDTIME  180 tablet  0  . diltiazem (CARDIZEM SR) 120 MG 12 hr capsule Take 120 mg by mouth 2 (two) times daily.      Marland Kitchen glucosamine-chondroitin 500-400 MG tablet Take 1 tablet by mouth 3 (three) times daily.      . Magnesium 250 MG TABS Take 1 tablet by mouth daily.      . Multiple Vitamin (MULTIVITAMIN WITH MINERALS) TABS Take 1 tablet by mouth daily.      . pantoprazole (PROTONIX) 40 MG tablet Take 30- 60 min before your first and last meals of the day  180 tablet  3  . potassium chloride SA (K-DUR,KLOR-CON) 20 MEQ tablet Take 20 mEq by mouth 2 (two) times daily.      . pravastatin (PRAVACHOL) 40 MG tablet Take 40 mg by mouth at bedtime.      . sucralfate (CARAFATE) 1 G tablet Take 1 tablet (1 g  total) by mouth 4 (four) times daily.  120 tablet  1   No current facility-administered medications on file prior to visit.   ALLERGIES Other; Prednisone; Vioxx; Entex t; Levaquin; Penicillins; Sulfa antibiotics; and Trovan  Past Medical History  Diagnosis Date  . Hypertension   . Hyperlipemia   . Anxiety and depression   . Arthritis   . Colon polyps   . Hemorrhoids   . GERD (gastroesophageal reflux disease)   . Panic disorder   . Kidney stones   . Stomach ulcer       Review of Systems  Skin: Positive for color change.  Psychiatric/Behavioral: Positive for agitation. Negative for suicidal ideas. The patient is nervous/anxious.   All other systems reviewed and are negative.   BP 122/96  Pulse 68  Temp(Src) 98.2 F (36.8 C) (Temporal)  Resp 18  Ht 5\' 5"  (1.651 m)  Wt 155 lb (70.308 kg)  BMI 25.79 kg/m2     Objective:   Physical Exam  Nursing note and vitals reviewed. Constitutional: She is oriented to person, place, and time. She appears well-developed and well-nourished.  HENT:  Head: Normocephalic and atraumatic.  Right Ear: External ear normal.  Left Ear: External ear normal.  Nose: Nose normal.  Mouth/Throat: Oropharynx is clear and moist.  Eyes: Conjunctivae and EOM are normal.  Neck: Normal range of motion.  Cardiovascular: Normal rate, regular rhythm, normal heart sounds and intact distal pulses.   Pulmonary/Chest: Effort normal and breath sounds normal.  Abdominal: Soft. Bowel sounds are normal. There is no tenderness.  Musculoskeletal: Normal range of motion.  Lymphadenopathy:    She has no cervical adenopathy.  Neurological: She is alert and oriented to person, place, and time.  Skin: Skin is warm and dry.     Psychiatric: She has a normal mood and affect. Her behavior is normal. Judgment normal.  Easily agitate when discuss family.          Assessment & Plan:  1. Skin change/ mole removal- Shave  Irreg/ atypical moles- Verbal permission  obtained. Areas prepped with alcohol. 1% lidocaine used at each area approx 1 cc ea. Shave excision performed with clear borders obtained and sent for pathology. Area bandaged with Neosporin/ guaze/ paper tape. Wound hygiene given. SX of infection explained pt w/c with any concerns. 2. Mood swings- Tegretol 200 mg 1 PO BID, start 1/2 pill qd x 1 week, then 1/2 BID, w/c if SX increase or ER. Recheck at [employer]

## 2013-10-07 ENCOUNTER — Ambulatory Visit (HOSPITAL_COMMUNITY): Payer: No Typology Code available for payment source

## 2013-10-12 ENCOUNTER — Other Ambulatory Visit: Payer: Self-pay | Admitting: Internal Medicine

## 2013-10-17 ENCOUNTER — Encounter: Payer: Self-pay | Admitting: Internal Medicine

## 2013-10-20 ENCOUNTER — Encounter: Payer: Self-pay | Admitting: Emergency Medicine

## 2013-10-20 ENCOUNTER — Ambulatory Visit (INDEPENDENT_AMBULATORY_CARE_PROVIDER_SITE_OTHER): Payer: 59 | Admitting: Emergency Medicine

## 2013-10-20 VITALS — BP 144/98 | HR 88 | Temp 98.2°F | Resp 20 | Ht 65.0 in | Wt 158.0 lb

## 2013-10-20 DIAGNOSIS — R21 Rash and other nonspecific skin eruption: Secondary | ICD-10-CM

## 2013-10-20 DIAGNOSIS — F39 Unspecified mood [affective] disorder: Secondary | ICD-10-CM

## 2013-10-20 DIAGNOSIS — R4586 Emotional lability: Secondary | ICD-10-CM

## 2013-10-20 DIAGNOSIS — J329 Chronic sinusitis, unspecified: Secondary | ICD-10-CM

## 2013-10-20 MED ORDER — AZITHROMYCIN 250 MG PO TABS: ORAL_TABLET | ORAL | Status: AC

## 2013-10-20 NOTE — Patient Instructions (Signed)
Sinusitis Sinusitis is redness, soreness, and puffiness (inflammation) of the air pockets in the bones of your face (sinuses). The redness, soreness, and puffiness can cause air and mucus to get trapped in your sinuses. This can allow germs to grow and cause an infection.  HOME CARE   Drink enough fluids to keep your pee (urine) clear or pale yellow.  Use a humidifier in your home.  Run a hot shower to create steam in the bathroom. Sit in the bathroom with the door closed. Breathe in the steam 3 4 times a day.  Put a warm, moist washcloth on your face 3 4 times a day, or as told by your doctor.  Use salt water sprays (saline sprays) to wet the thick fluid in your nose. This can help the sinuses drain.  Only take medicine as told by your doctor. GET HELP RIGHT AWAY IF:   Your pain gets worse.  You have very bad headaches.  You are sick to your stomach (nauseous).  You throw up (vomit).  You are very sleepy (drowsy) all the time.  Your face is puffy (swollen).  Your vision changes.  You have a stiff neck.  You have trouble breathing. MAKE SURE YOU:   Understand these instructions.  Will watch your condition.  Will get help right away if you are not doing well or get worse. Document Released: 02/07/2008 Document Revised: 05/15/2012 Document Reviewed: 03/26/2012 [employer] Patient Information 2014 [employer].  

## 2013-10-20 NOTE — Progress Notes (Signed)
Subjective:    Patient ID: Latoya [name], female    DOB: 1951/06/02, 63 y.o.   MRN: [id number]  HPI Comments: 63 yo female decreased Tegretol 200 to 1/2 TID over the weekend because she was more agitated. She has been Sleeping more with medicine. She notes she is taking 1 am 1/2 at lunch 1/2 bedtime, and occasional extra 1/2 if very anxious of Klonopin. She is walking 30 min daily she is eating healthy. She is sleeping good. She is napping more since starting Tegretol. She has also noticed she is more off balance. She also notes she thinks she has a sinus infection.  She has had increased sinus congestion x 2 weeks. She has noticed increased balance issues x  1 week. She notes thick green/ yellow production from nose occasionally from cough. She has not tried OTC. She notes balance is worse when changes positions.  She has noticed increase itch on right forearm over last couple of days, then rash appeared. She has not been exposed to any new agents other than Tegretol. She notes rash was present before scratch occurred on arm.   She is overdue for CPE due to lack of insurance and cost issues in the past. LAST LABS T 169 H 48 L97 TG 120.[facility] Rest of results pending.    Current Outpatient Prescriptions on File Prior to Visit  Medication Sig Dispense Refill  . albuterol (PROVENTIL HFA;VENTOLIN HFA) 108 (90 BASE) MCG/ACT inhaler Inhale 2 puffs into the lungs every 6 (six) hours as needed for wheezing or shortness of breath.  1 Inhaler  2  . aspirin 81 MG tablet Take 81 mg by mouth daily.      . Biotin 1000 MCG tablet Take 1,000 mcg by mouth 3 (three) times daily.      . bumetanide (BUMEX) 2 MG tablet Take 2 mg by mouth daily.      . carbamazepine (TEGRETOL) 200 MG tablet Take 1 tablet (200 mg total) by mouth 3 (three) times daily.  90 tablet  1  . cholecalciferol (VITAMIN D) 1000 UNITS tablet Take 1,000 Units by mouth daily.      . clonazePAM (KLONOPIN) 2 MG tablet TAKE 1/2 TABLET BY  MOUTH TWICE DAILY AND 1 TABLET BY MOUTH EVERY NIGHT AT BEDTIME  180 tablet  0  . diltiazem (CARDIZEM SR) 120 MG 12 hr capsule Take 120 mg by mouth 2 (two) times daily.      Marland Kitchen glucosamine-chondroitin 500-400 MG tablet Take 1 tablet by mouth 3 (three) times daily.      . Magnesium 250 MG TABS Take 1 tablet by mouth daily.      . naproxen (NAPROSYN) 500 MG tablet Take 500 mg by mouth 2 (two) times daily with a meal.      . pantoprazole (PROTONIX) 40 MG tablet Take 30- 60 min before your first and last meals of the day  180 tablet  3  . potassium chloride SA (K-DUR,KLOR-CON) 20 MEQ tablet TAKE 1 TABLET BY MOUTH TWICE DAILY  60 tablet  3  . pravastatin (PRAVACHOL) 40 MG tablet Take 40 mg by mouth at bedtime.      . sucralfate (CARAFATE) 1 G tablet Take 1 tablet (1 g total) by mouth 4 (four) times daily.  120 tablet  1   No current facility-administered medications on file prior to visit.   Allergies  Allergen Reactions  . Other     All pain medications: Unknown/ CODEINE  . Prednisone  Other (See Comments)    DYSPHORIA  . Vioxx [Rofecoxib]   . Entex T [Pseudoephedrine-Guaifenesin] Palpitations  . Levaquin [Levofloxacin] Rash  . Penicillins Rash  . Sulfa Antibiotics Rash  . Trovan [Alatrofloxacin] Rash   Past Medical History  Diagnosis Date  . Hypertension   . Hyperlipemia   . Anxiety and depression   . Arthritis   . Colon polyps   . Hemorrhoids   . GERD (gastroesophageal reflux disease)   . Panic disorder   . Kidney stones   . Stomach ulcer       Review of Systems  Constitutional: Positive for fatigue.  HENT: Positive for congestion and sinus pressure.   Skin: Positive for rash.  Neurological: Positive for dizziness and light-headedness.  Psychiatric/Behavioral: Positive for confusion and agitation. Negative for suicidal ideas. The patient is nervous/anxious.   All other systems reviewed and are negative.   BP 144/98  Pulse 88  Temp(Src) 98.2 F (36.8 C) (Temporal)   Resp 20  Ht 5\' 5"  (1.651 m)  Wt 158 lb (71.668 kg)  BMI 26.29 kg/m2     Objective:   Physical Exam  Nursing note and vitals reviewed. Constitutional: She is oriented to person, place, and time. She appears well-developed and well-nourished. No distress.  HENT:  Head: Normocephalic and atraumatic.  Right Ear: External ear normal.  Nose: Nose normal.  Mouth/Throat: Oropharynx is clear and moist.  Left canal obstructed with hard wax RT TM yellow Maxillary tenderness   Eyes: Conjunctivae and EOM are normal.  Neck: Normal range of motion. Neck supple. No JVD present. No thyromegaly present.  Cardiovascular: Normal rate, regular rhythm, normal heart sounds and intact distal pulses.   Pulmonary/Chest: Effort normal and breath sounds normal.  Abdominal: Soft. Bowel sounds are normal. She exhibits no distension and no mass. There is no tenderness. There is no rebound and no guarding.  Musculoskeletal: Normal range of motion. She exhibits no edema and no tenderness.  Lymphadenopathy:    She has no cervical adenopathy.  Neurological: She is alert and oriented to person, place, and time. No cranial nerve deficit. Coordination abnormal.  + off balance when 1st stands up leans to the left  Skin: Skin is warm and dry. Rash noted. There is erythema. No pallor.     Proximal forearm with circular patch with mild erythema and scattered 2-3 mm sandpaper like elevations. 2 inch linear scratch mid forearm. Both hands dry with mild erythema at knuckles  Psychiatric: She has a normal mood and affect. Her behavior is normal. Thought content normal.  Seems more agreeable than normal and happier but mildly sedated          Assessment & Plan:  1. Mood disorder- Decrease Tegretol to 1/2 BID, advised needs counseling, f/u with results 2. ? Rash right arm- Cortisone cream, hygiene explained 3. Wants pap and overdue for CPE CHECK/ [facility] 2 month 4. Vertigo/ Sinusitis/Allergic rhinitis- Allegra OTC,  increase H2o, allergy hygiene explained.- Zpak AD, decline Pred with agitation HX.  5. Cerumen impaction- debrox OTC AD, w/c if SX increase

## 2013-10-21 ENCOUNTER — Encounter: Payer: Self-pay | Admitting: Emergency Medicine

## 2013-11-10 ENCOUNTER — Ambulatory Visit (HOSPITAL_COMMUNITY)
Admission: RE | Admit: 2013-11-10 | Discharge: 2013-11-10 | Disposition: A | Discharge status: Home / Self Care | Payer: 59 | Source: Ambulatory Visit | Attending: Internal Medicine | Admitting: Internal Medicine

## 2013-11-10 DIAGNOSIS — Z1231 Encounter for screening mammogram for malignant neoplasm of breast: Secondary | ICD-10-CM

## 2013-11-17 ENCOUNTER — Telehealth: Payer: Self-pay | Admitting: *Deleted

## 2013-11-17 NOTE — Telephone Encounter (Signed)
Pt was seen by Dr Tamera Punt at [place], Dr Tamera Punt wants pt to see Dr Grandville Silos in there practice for severe right thumb & wrist pain with severe arthritis.  Because pt has [facility] they are saying Pt needs a referral to see Dr Grandville Silos

## 2013-11-25 ENCOUNTER — Telehealth: Payer: Self-pay | Admitting: *Deleted

## 2013-11-26 ENCOUNTER — Encounter: Payer: Self-pay | Admitting: Emergency Medicine

## 2013-11-26 ENCOUNTER — Ambulatory Visit (INDEPENDENT_AMBULATORY_CARE_PROVIDER_SITE_OTHER): Payer: 59 | Admitting: Emergency Medicine

## 2013-11-26 VITALS — BP 128/90 | HR 80 | Temp 98.2°F | Resp 18 | Ht 60.0 in | Wt 160.0 lb

## 2013-11-26 DIAGNOSIS — R2689 Other abnormalities of gait and mobility: Secondary | ICD-10-CM

## 2013-11-26 DIAGNOSIS — I10 Essential (primary) hypertension: Secondary | ICD-10-CM | POA: Insufficient documentation

## 2013-11-26 DIAGNOSIS — E538 Deficiency of other specified B group vitamins: Secondary | ICD-10-CM

## 2013-11-26 DIAGNOSIS — R7309 Other abnormal glucose: Secondary | ICD-10-CM

## 2013-11-26 DIAGNOSIS — R5383 Other fatigue: Secondary | ICD-10-CM

## 2013-11-26 DIAGNOSIS — R5381 Other malaise: Secondary | ICD-10-CM

## 2013-11-26 DIAGNOSIS — F39 Unspecified mood [affective] disorder: Secondary | ICD-10-CM

## 2013-11-26 DIAGNOSIS — E782 Mixed hyperlipidemia: Secondary | ICD-10-CM

## 2013-11-26 DIAGNOSIS — R51 Headache: Secondary | ICD-10-CM

## 2013-11-26 HISTORY — DX: Mixed hyperlipidemia: E78.2

## 2013-11-26 HISTORY — DX: Essential (primary) hypertension: I10

## 2013-11-26 LAB — CBC WITH DIFFERENTIAL/PLATELET
Basophils Absolute: 0 10*3/uL (ref 0.0–0.1)
Basophils Relative: 1 % (ref 0–1)
Eosinophils Absolute: 0.1 10*3/uL (ref 0.0–0.7)
Eosinophils Relative: 3 % (ref 0–5)
HCT: 42.9 % (ref 36.0–46.0)
Hemoglobin: 14.7 g/dL (ref 12.0–15.0)
Lymphocytes Relative: 30 % (ref 12–46)
Lymphs Abs: 1.3 10*3/uL (ref 0.7–4.0)
MCH: 30.5 pg (ref 26.0–34.0)
MCHC: 34.3 g/dL (ref 30.0–36.0)
MCV: 89 fL (ref 78.0–100.0)
Monocytes Absolute: 0.5 10*3/uL (ref 0.1–1.0)
Monocytes Relative: 12 % (ref 3–12)
Neutro Abs: 2.4 10*3/uL (ref 1.7–7.7)
Neutrophils Relative %: 54 % (ref 43–77)
Platelets: 211 10*3/uL (ref 150–400)
RBC: 4.82 MIL/uL (ref 3.87–5.11)
RDW: 13.6 % (ref 11.5–15.5)
WBC: 4.4 10*3/uL (ref 4.0–10.5)

## 2013-11-26 LAB — HEMOGLOBIN A1C
Hgb A1c MFr Bld: 5.8 % — ABNORMAL HIGH (ref <5.7)
Mean Plasma Glucose: 120 mg/dL — ABNORMAL HIGH (ref <117)

## 2013-11-26 NOTE — Patient Instructions (Signed)
Mood Disorders 1/2 of tegretol today and 1/2 tomorrow then STOP. Make appointment with Dr Casimiro Needle for mood disorder [address] [phone] Mood disorders are conditions that affect the way a person feels emotionally. The main mood disorders include:  Depression.  Bipolar disorder.  Dysthymia. Dysthymia is a mild, lasting (chronic) depression. Symptoms of dysthymia are similar to depression, but not as severe.  Cyclothymia. Cyclothymia includes mood swings, but the highs and lows are not as severe as they are in bipolar disorder. Symptoms of cyclothymia are similar to those of bipolar disorder, but less extreme. CAUSES  Mood disorders are probably caused by a combination of factors. People with mood disorders seem to have physical and chemical changes in their brains. Mood disorders run in families, so there may be genetic causes. Severe trauma or stressful life events may also increase the risk of mood disorders.  SYMPTOMS  Symptoms of mood disorders depend on the specific type of condition. Depression symptoms include:  Feeling sad, worthless, or hopeless.  Negative thoughts.  Inability to enjoy one's usual activities.  Low energy.  Sleeping too much or too little.  Appetite changes.  Crying.  Concentration problems.  Thoughts of harming oneself. Bipolar disorder symptoms include:  Periods of depression (see above symptoms).  Mood swings, from sadness and depression, to abnormal elation and excitement.  Periods of mania:  Racing thoughts.  Fast speech.  Poor judgment, and careless, dangerous choices.  Decreased need for sleep.  Risky behavior.  Difficulty concentrating.  Irritability.  Increased energy.  Increased sex drive. DIAGNOSIS  There are no blood tests or X-rays that can confirm a mood disorder. However, your caregiver may choose to run some tests to make sure that there is not another physical cause for your symptoms. A mood disorder is  usually diagnosed after an in-depth interview with a caregiver. TREATMENT  Mood disorders can be treated with one or more of the following:  Medicine. This may include antidepressants, mood-stabilizers, or anti-psychotics.  Psychotherapy (talk therapy).  Cognitive behavioral therapy. You are taught to recognize negative thoughts and behavior patterns, and replace them with healthy thoughts and behaviors.  Electroconvulsive therapy. For very severe cases of deep depression, a series of treatments in which an electrical current is applied to the brain.  Vagus nerve stimulation. A pulse of electricity is applied to a portion of the brain.  Transcranial magnetic stimulation. Powerful magnets are placed on the head that produce electrical currents.  Hospitalization. In severe situations, or when someone is having serious thoughts of harming him or herself, hospitalization may be necessary in order to keep the person safe. This is also done to quickly start and monitor treatment. HOME CARE INSTRUCTIONS   Take your medicine exactly as directed.  Attend all of your therapy sessions.  Try to eat regular, healthy meals.  Exercise daily. Exercise may improve mood symptoms.  Get good sleep.  Do not drink alcohol or use pot or other drugs. These can worsen mood symptoms and cause anxiety and psychosis.  Tell your caregiver if you develop any side effects, such as feeling sick to your stomach (nauseous), dry mouth, dizziness, constipation, drowsiness, tremor, weight gain, or sexual symptoms. He or she may suggest things you can do to improve symptoms.  Learn ways to cope with the stress of having a chronic illness. This includes yoga, meditation, tai chi, or participating in a support group.  Drink enough water to keep your urine clear or pale yellow. Eat a high-fiber  diet. These habits may help you avoid constipation from your medicine. SEEK IMMEDIATE MEDICAL CARE IF:  Your mood  worsens.  You have thoughts of hurting yourself or others.  You cannot care for yourself.  You develop the sensation of hearing or seeing something that is not actually present (auditory or visual hallucinations).  You develop abnormal thoughts. Document Released: 06/18/2009 Document Revised: 11/13/2011 Document Reviewed: 06/18/2009 [employer] Patient Information 2014 [employer].  

## 2013-11-26 NOTE — Telephone Encounter (Signed)
PT CALLED DR PLOVSKY OFFICE TO CHECK ON REFERRAL. THEY TOLD HER SHE IS NOT IN NETWORK WITH [employer].  PLEASE ADVISE PT OF IN NETWORK DOCTOR.   PT WANTS TO KNOW WHAT YOU WANT THE CT SCAN FOR?  THANKS, KAT

## 2013-11-26 NOTE — Progress Notes (Signed)
Subjective:    Patient ID: Latoya [name], female    DOB: 08-14-1951, 63 y.o.   MRN: [id number]  HPI Comments: 63 yo female has been on Tegretol for several weeks and has helped with mood and sleep, but now she is feeling worse. She is noticing more tremors, irritability, and moods fluctuating. She denies any new stressors. She denies any new exposure. sHE HAS BEEN MORE off balance than usual. She is staying confused and notes memory has declined.  She has appointment with hand specialists for pain in right thumb. She is wearing splint that helps with symptoms. She has tried OTC cream w/o relief. She denies injury/ strain but notes constantly active in yard.   She is also due for her presents for 3 month F/U for HTN, Cholesterol, Pre-Dm, D. Deficient. She is not exercising but stays busy. She is eating Okay. She notes BP god at home.   LAST LABS B12 254 INSULIN 67 TG 189 H 38 A1C 5.6    Medication List       This list is accurate as of: 11/26/13 12:48 PM.  Always use your most recent med list.               albuterol 108 (90 BASE) MCG/ACT inhaler  Commonly known as:  PROVENTIL HFA;VENTOLIN HFA  Inhale 2 puffs into the lungs every 6 (six) hours as needed for wheezing or shortness of breath.     aspirin 81 MG tablet  Take 81 mg by mouth daily.     Biotin 1000 MCG tablet  Take 1,000 mcg by mouth 3 (three) times daily.     bumetanide 2 MG tablet  Commonly known as:  BUMEX  Take 2 mg by mouth daily.     carbamazepine 200 MG tablet  Commonly known as:  TEGRETOL  Take 1 tablet (200 mg total) by mouth 3 (three) times daily.     cholecalciferol 1000 UNITS tablet  Commonly known as:  VITAMIN D  Take 1,000 Units by mouth daily.     clonazePAM 2 MG tablet  Commonly known as:  KLONOPIN  TAKE 1/2 TABLET BY MOUTH TWICE DAILY AND 1 TABLET BY MOUTH EVERY NIGHT AT BEDTIME     diltiazem 120 MG 12 hr capsule  Commonly known as:  CARDIZEM SR  Take 120 mg by mouth 2 (two) times daily.      glucosamine-chondroitin 500-400 MG tablet  Take 1 tablet by mouth 3 (three) times daily.     Magnesium 250 MG Tabs  Take 1 tablet by mouth daily.     naproxen 500 MG tablet  Commonly known as:  NAPROSYN  Take 500 mg by mouth 2 (two) times daily with a meal.     pantoprazole 40 MG tablet  Commonly known as:  PROTONIX  Take 30- 60 min before your first and last meals of the day     potassium chloride SA 20 MEQ tablet  Commonly known as:  K-DUR,KLOR-CON  TAKE 1 TABLET BY MOUTH TWICE DAILY     pravastatin 40 MG tablet  Commonly known as:  PRAVACHOL  Take 40 mg by mouth at bedtime.     sucralfate 1 G tablet  Commonly known as:  CARAFATE  Take 1 tablet (1 g total) by mouth 4 (four) times daily.       Allergies  Allergen Reactions  . Other     All pain medications: Unknown/ CODEINE  . Prednisone Other (See Comments)  DYSPHORIA  . Vioxx [Rofecoxib]   . Entex T [Pseudoephedrine-Guaifenesin] Palpitations  . Levaquin [Levofloxacin] Rash  . Penicillins Rash  . Sulfa Antibiotics Rash  . Trovan [Alatrofloxacin] Rash   Past Medical History  Diagnosis Date  . Hypertension   . Hyperlipemia   . Anxiety and depression   . Arthritis   . Colon polyps   . Hemorrhoids   . GERD (gastroesophageal reflux disease)   . Panic disorder   . Kidney stones   . Stomach ulcer       Review of Systems  Constitutional: Positive for fatigue.  Neurological: Positive for dizziness, weakness and light-headedness.  Psychiatric/Behavioral: Positive for sleep disturbance, decreased concentration and agitation. The patient is nervous/anxious.   All other systems reviewed and are negative.   BP 128/90  Pulse 80  Temp(Src) 98.2 F (36.8 C) (Temporal)  Resp 18  Ht 5' (1.524 m)  Wt 160 lb (72.576 kg)  BMI 31.25 kg/m2     Objective:   Physical Exam  Nursing note and vitals reviewed. Constitutional: She is oriented to person, place, and time. She appears well-developed and  well-nourished. No distress.  HENT:  Head: Normocephalic and atraumatic.  Right Ear: External ear normal.  Left Ear: External ear normal.  Nose: Nose normal.  Mouth/Throat: Oropharynx is clear and moist.  Eyes: Conjunctivae and EOM are normal.  Neck: Normal range of motion. Neck supple. No JVD present. No thyromegaly present.  Cardiovascular: Normal rate, regular rhythm, normal heart sounds and intact distal pulses.   1 + edema pitting bilateral LE  Pulmonary/Chest: Effort normal and breath sounds normal.  Abdominal: Soft. Bowel sounds are normal. She exhibits no distension and no mass. There is no tenderness. There is no rebound and no guarding.  Musculoskeletal: She exhibits tenderness. She exhibits no edema.  Right thumb decreased ROM with pain  Lymphadenopathy:    She has no cervical adenopathy.  Neurological: She is alert and oriented to person, place, and time. No cranial nerve deficit.  Skin: Skin is warm and dry. No rash noted. No erythema. No pallor.  Psychiatric: Her behavior is normal. Judgment and thought content normal.  Patient asleep at start of visit but arouses easily, easily agitated but calms quickly          Assessment & Plan:  1.  3 month F/U for HTN, Cholesterol, Pre-Dm, D. Deficient. Needs healthy diet, cardio QD and obtain healthy weight. Check Labs, Check BP if >130/80 call office  2. Anemia vs fatigue- Recheck labs, take Vitamins AD increase green leafy veggies, increase H2O, w/c if any symptom increase.  3. Mood disorder with confusion/ balance disturbance- Slowly d/c tegretol, refer to Psych, Head CT to further evaluate, increase H2o, Rest, w/c if SX increase or ER.  4. Edema- elevate legs TID, increase activity, increase H2o, decrease sodium intake, Wear compression socks more routinely if available. Restart Lasix AD  5. Thumb pain- keep ortho f/u and continue brace AD  OVER 40 minutes of exam, counseling, chart review, referral performed

## 2013-11-27 ENCOUNTER — Ambulatory Visit
Admission: RE | Admit: 2013-11-27 | Discharge: 2013-11-27 | Disposition: A | Discharge status: Home / Self Care | Payer: 59 | Source: Ambulatory Visit | Attending: Emergency Medicine | Admitting: Emergency Medicine

## 2013-11-27 DIAGNOSIS — R5383 Other fatigue: Principal | ICD-10-CM

## 2013-11-27 DIAGNOSIS — R51 Headache: Secondary | ICD-10-CM

## 2013-11-27 DIAGNOSIS — R2689 Other abnormalities of gait and mobility: Secondary | ICD-10-CM

## 2013-11-27 DIAGNOSIS — R5381 Other malaise: Secondary | ICD-10-CM

## 2013-11-27 LAB — TSH: TSH: 1.617 u[IU]/mL (ref 0.350–4.500)

## 2013-11-27 LAB — BASIC METABOLIC PANEL WITH GFR
BUN: 14 mg/dL (ref 6–23)
CO2: 26 mEq/L (ref 19–32)
Calcium: 9.5 mg/dL (ref 8.4–10.5)
Chloride: 106 mEq/L (ref 96–112)
Creat: 0.8 mg/dL (ref 0.50–1.10)
GFR, Est African American: >89 mL/min
GFR, Est Non African American: 79 mL/min
Glucose, Bld: 87 mg/dL (ref 70–99)
Potassium: 4.8 mEq/L (ref 3.5–5.3)
Sodium: 141 mEq/L (ref 135–145)

## 2013-11-27 LAB — LIPID PANEL
Cholesterol: 166 mg/dL (ref 0–200)
HDL: 48 mg/dL (ref >39)
LDL Cholesterol: 89 mg/dL (ref 0–99)
Total CHOL/HDL Ratio: 3.5 Ratio
Triglycerides: 143 mg/dL (ref <150)
VLDL: 29 mg/dL (ref 0–40)

## 2013-11-27 LAB — HEPATIC FUNCTION PANEL
ALT: 15 U/L (ref 0–35)
AST: 24 U/L (ref 0–37)
Albumin: 4.4 g/dL (ref 3.5–5.2)
Alkaline Phosphatase: 121 U/L — ABNORMAL HIGH (ref 39–117)
Bilirubin, Direct: 0.1 mg/dL (ref 0.0–0.3)
Indirect Bilirubin: 0.3 mg/dL (ref 0.2–1.2)
Total Bilirubin: 0.4 mg/dL (ref 0.2–1.2)
Total Protein: 6.5 g/dL (ref 6.0–8.3)

## 2013-11-27 LAB — VITAMIN B12: Vitamin B-12: 214 pg/mL (ref 211–911)

## 2013-11-27 LAB — INSULIN, FASTING: Insulin fasting, serum: 15 u[IU]/mL (ref 3–28)

## 2013-12-02 ENCOUNTER — Other Ambulatory Visit: Payer: Self-pay | Admitting: Orthopedic Surgery

## 2013-12-03 ENCOUNTER — Encounter (HOSPITAL_BASED_OUTPATIENT_CLINIC_OR_DEPARTMENT_OTHER)
Admission: RE | Admit: 2013-12-03 | Discharge: 2013-12-03 | Disposition: A | Discharge status: Home / Self Care | Payer: 59 | Source: Ambulatory Visit | Attending: Orthopedic Surgery | Admitting: Orthopedic Surgery

## 2013-12-03 ENCOUNTER — Other Ambulatory Visit: Payer: Self-pay | Admitting: Emergency Medicine

## 2013-12-03 ENCOUNTER — Encounter (HOSPITAL_BASED_OUTPATIENT_CLINIC_OR_DEPARTMENT_OTHER): Payer: Self-pay | Admitting: *Deleted

## 2013-12-03 DIAGNOSIS — R4586 Emotional lability: Secondary | ICD-10-CM

## 2013-12-03 DIAGNOSIS — Z01810 Encounter for preprocedural cardiovascular examination: Secondary | ICD-10-CM | POA: Insufficient documentation

## 2013-12-03 NOTE — H&P (Signed)
Latoya [name] is an 63 y.o. female.   CC / Reason for Visit: Right hand pain HPI: This patient is a 63 year old female who presents for evaluation of her right hand.  She has established basilar joint arthritis and had a TMC injection given by Dr. Tamera Punt on 10-03-13.  She has also been using a hand-based thumb spica splint that she obtained over-the-counter.  She has had pain for several months, getting worse, now more constant and severe.  It is throbbing and aching.   Past Medical History  Diagnosis Date  . Hypertension   . Hyperlipemia   . Anxiety and depression   . Arthritis   . Colon polyps   . Hemorrhoids   . GERD (gastroesophageal reflux disease)   . Panic disorder   . Kidney stones   . Stomach ulcer   . Unspecified essential hypertension 11/26/2013  . Mixed hyperlipidemia 11/26/2013    Past Surgical History  Procedure Laterality Date  . Abdominal hysterectomy    . Tubal ligation    . Foot surgery  2008    left  . Colonoscopy    . Upper gastrointestinal endoscopy    . Tonsillectomy    . Oophorectomy      Family History  Problem Relation Age of Onset  . Heart disease Brother     x2  . Stroke Brother   . Heart disease Father   . Asthma Mother    Social History:  reports that she has never smoked. She has never used smokeless tobacco. She reports that she does not drink alcohol or use illicit drugs.  Allergies:  Allergies  Allergen Reactions  . Other     All pain medications: Unknown/ CODEINE  . Prednisone Other (See Comments)    DYSPHORIA  . Vioxx [Rofecoxib]   . Entex T [Pseudoephedrine-Guaifenesin] Palpitations  . Levaquin [Levofloxacin] Rash  . Penicillins Rash  . Sulfa Antibiotics Rash  . Trovan [Alatrofloxacin] Rash    No prescriptions prior to admission    No results found for this or any previous visit (from the past 48 hour(s)). No results found.  Review of Systems  All other systems reviewed and are negative.    Height 5' (1.524 m),  weight 72.576 kg (160 lb). Physical Exam   Constitutional:  WD, WN, NAD HEENT:  NCAT, EOMI Neuro/Psych:  Alert & oriented to person, place, and time; appropriate mood & affect Lymphatic: No generalized UE edema or lymphadenopathy Extremities / MSK:  Both UE are normal with respect to appearance, ranges of motion, joint stability, muscle strength/tone, sensation, & perfusion except as otherwise noted:  Right TMC enlarged with Z-shaped collapse consistent with TMC arthritis.  The MP joint hyperextends to 25, flexes to 30 with some very mild UCL laxity.  TTP at [facility], increased pain with TMC stress and grind.  Key pinch: Right 4 and painful/left 12 and tip pinch: Right 3 and painful/left 10  Labs / Xrays:  No radiographic studies obtained today.  Previous x-rays reviewed reveal advanced TMC osteoarthritis  Assessment:  Right TMC osteoarthritis  Plan: I discussed this entity with the patient and reviewed the spectrum of treatment, including details of surgical treatment.  She was fitted with a cool comfort splint and will followup in 3 weeks to determine how best to proceed.  Latoya [name] A. 12/03/2013, 6:09 PM

## 2013-12-03 NOTE — Progress Notes (Signed)
To come in for ekg-had labs pcp 11/26/13 Never smoked-hx bronchitis-doing good now-no cardiac problems

## 2013-12-04 ENCOUNTER — Telehealth: Payer: Self-pay

## 2013-12-04 NOTE — Telephone Encounter (Signed)
Pt needs referral for psych. Pt needs to sched own appt. Referral to insurance cannot be made until she decides which psych to see. lmom

## 2013-12-08 ENCOUNTER — Ambulatory Visit (HOSPITAL_BASED_OUTPATIENT_CLINIC_OR_DEPARTMENT_OTHER)
Admission: RE | Admit: 2013-12-08 | Discharge: 2013-12-08 | Disposition: A | Discharge status: Home / Self Care | Payer: 59 | Source: Ambulatory Visit | Attending: Orthopedic Surgery | Admitting: Orthopedic Surgery

## 2013-12-08 ENCOUNTER — Encounter (HOSPITAL_BASED_OUTPATIENT_CLINIC_OR_DEPARTMENT_OTHER)
Admission: RE | Disposition: A | Discharge status: Home / Self Care | Payer: Self-pay | Source: Ambulatory Visit | Attending: Orthopedic Surgery

## 2013-12-08 ENCOUNTER — Encounter (HOSPITAL_BASED_OUTPATIENT_CLINIC_OR_DEPARTMENT_OTHER): Payer: Self-pay | Admitting: *Deleted

## 2013-12-08 ENCOUNTER — Encounter (HOSPITAL_BASED_OUTPATIENT_CLINIC_OR_DEPARTMENT_OTHER): Payer: 59 | Admitting: Anesthesiology

## 2013-12-08 ENCOUNTER — Ambulatory Visit (HOSPITAL_BASED_OUTPATIENT_CLINIC_OR_DEPARTMENT_OTHER): Payer: 59 | Admitting: Anesthesiology

## 2013-12-08 DIAGNOSIS — M19049 Primary osteoarthritis, unspecified hand: Secondary | ICD-10-CM | POA: Insufficient documentation

## 2013-12-08 DIAGNOSIS — F3289 Other specified depressive episodes: Secondary | ICD-10-CM | POA: Insufficient documentation

## 2013-12-08 DIAGNOSIS — F329 Major depressive disorder, single episode, unspecified: Secondary | ICD-10-CM | POA: Insufficient documentation

## 2013-12-08 DIAGNOSIS — F411 Generalized anxiety disorder: Secondary | ICD-10-CM | POA: Insufficient documentation

## 2013-12-08 DIAGNOSIS — I10 Essential (primary) hypertension: Secondary | ICD-10-CM | POA: Insufficient documentation

## 2013-12-08 DIAGNOSIS — J45909 Unspecified asthma, uncomplicated: Secondary | ICD-10-CM | POA: Insufficient documentation

## 2013-12-08 DIAGNOSIS — K219 Gastro-esophageal reflux disease without esophagitis: Secondary | ICD-10-CM | POA: Insufficient documentation

## 2013-12-08 DIAGNOSIS — E782 Mixed hyperlipidemia: Secondary | ICD-10-CM | POA: Insufficient documentation

## 2013-12-08 DIAGNOSIS — F410 Panic disorder [episodic paroxysmal anxiety] without agoraphobia: Secondary | ICD-10-CM | POA: Insufficient documentation

## 2013-12-08 HISTORY — PX: CARPOMETACARPEL SUSPENSION PLASTY: SHX5005

## 2013-12-08 SURGERY — CARPOMETACARPEL (CMC) SUSPENSION PLASTY
Anesthesia: General | Site: Wrist | Laterality: Right

## 2013-12-08 MED ORDER — MEPERIDINE HCL 25 MG/ML IJ SOLN: 6.2500 mg | INTRAMUSCULAR | Status: DC | PRN

## 2013-12-08 MED ORDER — FENTANYL CITRATE 0.05 MG/ML IJ SOLN: 50.0000 ug | INTRAMUSCULAR | Status: DC | PRN

## 2013-12-08 MED ORDER — PROPOFOL 10 MG/ML IV BOLUS
INTRAVENOUS | Status: DC | PRN
  Administered 2013-12-08: 150 mg via INTRAVENOUS

## 2013-12-08 MED ORDER — MIDAZOLAM HCL 2 MG/2ML IJ SOLN: 0.5000 mg | Freq: Once | INTRAMUSCULAR | Status: DC | PRN

## 2013-12-08 MED ORDER — BUPIVACAINE-EPINEPHRINE PF 0.25-1:200000 % IJ SOLN
INTRAMUSCULAR | Status: AC
  Filled 2013-12-08: qty 150

## 2013-12-08 MED ORDER — MIDAZOLAM HCL 2 MG/2ML IJ SOLN
INTRAMUSCULAR | Status: AC
  Filled 2013-12-08: qty 2

## 2013-12-08 MED ORDER — BUPIVACAINE HCL (PF) 0.5 % IJ SOLN
INTRAMUSCULAR | Status: AC
  Filled 2013-12-08: qty 90

## 2013-12-08 MED ORDER — OXYCODONE HCL 5 MG/5ML PO SOLN: 5.0000 mg | Freq: Once | ORAL | Status: DC | PRN

## 2013-12-08 MED ORDER — BUPIVACAINE-EPINEPHRINE PF 0.5-1:200000 % IJ SOLN
INTRAMUSCULAR | Status: DC | PRN
  Administered 2013-12-08: 10 mL

## 2013-12-08 MED ORDER — ONDANSETRON HCL 4 MG/2ML IJ SOLN
INTRAMUSCULAR | Status: DC | PRN
  Administered 2013-12-08: 4 mg via INTRAVENOUS

## 2013-12-08 MED ORDER — OXYCODONE-ACETAMINOPHEN 5-325 MG PO TABS: 1.0000 | ORAL_TABLET | ORAL | Status: DC | PRN

## 2013-12-08 MED ORDER — PROPOFOL 10 MG/ML IV BOLUS
INTRAVENOUS | Status: AC
  Filled 2013-12-08: qty 20

## 2013-12-08 MED ORDER — MIDAZOLAM HCL 5 MG/5ML IJ SOLN
INTRAMUSCULAR | Status: DC | PRN
  Administered 2013-12-08: 2 mg via INTRAVENOUS

## 2013-12-08 MED ORDER — BUPIVACAINE HCL (PF) 0.25 % IJ SOLN
INTRAMUSCULAR | Status: AC
  Filled 2013-12-08: qty 90

## 2013-12-08 MED ORDER — CHLORHEXIDINE GLUCONATE 4 % EX LIQD: 60.0000 mL | Freq: Once | CUTANEOUS | Status: DC

## 2013-12-08 MED ORDER — FENTANYL CITRATE 0.05 MG/ML IJ SOLN
INTRAMUSCULAR | Status: AC
  Filled 2013-12-08: qty 4

## 2013-12-08 MED ORDER — FENTANYL CITRATE 0.05 MG/ML IJ SOLN
INTRAMUSCULAR | Status: DC | PRN
  Administered 2013-12-08: 50 ug via INTRAVENOUS
  Administered 2013-12-08 (×2): 25 ug via INTRAVENOUS
  Administered 2013-12-08: 50 ug via INTRAVENOUS

## 2013-12-08 MED ORDER — LIDOCAINE HCL (CARDIAC) 20 MG/ML IV SOLN
INTRAVENOUS | Status: DC | PRN
  Administered 2013-12-08: 50 mg via INTRAVENOUS

## 2013-12-08 MED ORDER — PROMETHAZINE HCL 25 MG/ML IJ SOLN
INTRAMUSCULAR | Status: AC
  Filled 2013-12-08: qty 1

## 2013-12-08 MED ORDER — MIDAZOLAM HCL 2 MG/2ML IJ SOLN: 1.0000 mg | INTRAMUSCULAR | Status: DC | PRN

## 2013-12-08 MED ORDER — HYDROMORPHONE HCL PF 1 MG/ML IJ SOLN
0.2500 mg | INTRAMUSCULAR | Status: DC | PRN
  Administered 2013-12-08 (×2): 0.25 mg via INTRAVENOUS
  Administered 2013-12-08: 0.5 mg via INTRAVENOUS

## 2013-12-08 MED ORDER — LACTATED RINGERS IV SOLN
INTRAVENOUS | Status: DC
  Administered 2013-12-08: 08:00:00 via INTRAVENOUS

## 2013-12-08 MED ORDER — BUPIVACAINE-EPINEPHRINE PF 0.5-1:200000 % IJ SOLN
INTRAMUSCULAR | Status: AC
  Filled 2013-12-08: qty 150

## 2013-12-08 MED ORDER — HYDROMORPHONE HCL PF 1 MG/ML IJ SOLN
INTRAMUSCULAR | Status: AC
  Filled 2013-12-08: qty 1

## 2013-12-08 MED ORDER — OXYCODONE HCL 5 MG PO TABS: 5.0000 mg | ORAL_TABLET | Freq: Once | ORAL | Status: DC | PRN

## 2013-12-08 MED ORDER — PROMETHAZINE HCL 25 MG/ML IJ SOLN
6.2500 mg | INTRAMUSCULAR | Status: DC | PRN
  Administered 2013-12-08: 6.25 mg via INTRAVENOUS

## 2013-12-08 MED ORDER — VANCOMYCIN HCL IN DEXTROSE 1-5 GM/200ML-% IV SOLN
1000.0000 mg | INTRAVENOUS | Status: AC
  Administered 2013-12-08: 1000 mg via INTRAVENOUS

## 2013-12-08 MED ORDER — GLYCOPYRROLATE 0.2 MG/ML IJ SOLN
INTRAMUSCULAR | Status: DC | PRN
  Administered 2013-12-08: 0.2 mg via INTRAVENOUS

## 2013-12-08 SURGICAL SUPPLY — 54 items
BIT DRILL 11/64XX180123XX4 (BIT)
BIT DRILL 11/64XX180123XX4.3 (BIT) IMPLANT
BLADE AVERAGE 25X9 (BLADE) ×2 IMPLANT
BLADE MINI RND TIP GREEN BEAV (BLADE) IMPLANT
BLADE SURG 15 STRL LF DISP TIS (BLADE) ×2 IMPLANT
BLADE SURG 15 STRL SS (BLADE) ×2
BNDG COHESIVE 3X5 TAN STRL LF (GAUZE/BANDAGES/DRESSINGS) ×2 IMPLANT
BNDG COHESIVE 4X5 TAN STRL (GAUZE/BANDAGES/DRESSINGS) ×2 IMPLANT
BNDG ESMARK 4X9 LF (GAUZE/BANDAGES/DRESSINGS) ×2 IMPLANT
BNDG GAUZE ELAST 4 BULKY (GAUZE/BANDAGES/DRESSINGS) ×4 IMPLANT
CHLORAPREP W/TINT 26ML (MISCELLANEOUS) ×2 IMPLANT
CORDS BIPOLAR (ELECTRODE) ×2 IMPLANT
COVER TABLE BACK 60X90 (DRAPES) ×2 IMPLANT
CUFF TOURNIQUET SINGLE 18IN (TOURNIQUET CUFF) ×2 IMPLANT
DECANTER SPIKE VIAL GLASS SM (MISCELLANEOUS) IMPLANT
DRAPE EXTREMITY T 121X128X90 (DRAPE) ×2 IMPLANT
DRAPE SURG 17X23 STRL (DRAPES) ×2 IMPLANT
DRILL BIT 1/8DIAX5INL DISPOSE (BIT) IMPLANT
DRILL BIT 11/64XX180123XX4.3 (BIT)
DRSG EMULSION OIL 3X3 NADH (GAUZE/BANDAGES/DRESSINGS) ×2 IMPLANT
GLOVE BIO SURGEON STRL SZ7.5 (GLOVE) ×4 IMPLANT
GLOVE BIOGEL PI IND STRL 8 (GLOVE) ×1 IMPLANT
GLOVE BIOGEL PI INDICATOR 8 (GLOVE) ×1
GLOVE ECLIPSE 6.5 STRL STRAW (GLOVE) ×4 IMPLANT
GOWN STRL REUS W/ TWL LRG LVL3 (GOWN DISPOSABLE) ×2 IMPLANT
GOWN STRL REUS W/TWL LRG LVL3 (GOWN DISPOSABLE) ×2
K-WIRE .062X4 (WIRE) ×2 IMPLANT
LOOP VESSEL MAXI BLUE (MISCELLANEOUS) IMPLANT
LOOP VESSEL MINI RED (MISCELLANEOUS) IMPLANT
NEEDLE HYPO 25X1 1.5 SAFETY (NEEDLE) ×2 IMPLANT
NS IRRIG 1000ML POUR BTL (IV SOLUTION) ×2 IMPLANT
PACK BASIN DAY SURGERY FS (CUSTOM PROCEDURE TRAY) ×2 IMPLANT
PADDING CAST ABS 3INX4YD NS (CAST SUPPLIES)
PADDING CAST ABS 4INX4YD NS (CAST SUPPLIES) ×1
PADDING CAST ABS COTTON 3X4 (CAST SUPPLIES) IMPLANT
PADDING CAST ABS COTTON 4X4 ST (CAST SUPPLIES) ×1 IMPLANT
SHEET MEDIUM DRAPE 40X70 STRL (DRAPES) ×2 IMPLANT
SLEEVE SCD COMPRESS KNEE MED (MISCELLANEOUS) ×2 IMPLANT
SPLINT FAST PLASTER 5X30 (CAST SUPPLIES)
SPLINT PLASTER CAST FAST 5X30 (CAST SUPPLIES) IMPLANT
SPLINT PLASTER CAST XFAST 3X15 (CAST SUPPLIES) ×10 IMPLANT
SPLINT PLASTER XTRA FASTSET 3X (CAST SUPPLIES) ×10
SPONGE GAUZE 4X4 12PLY (GAUZE/BANDAGES/DRESSINGS) ×2 IMPLANT
STOCKINETTE 4X48 STRL (DRAPES) ×2 IMPLANT
SUT ETHIBOND 2-0 V-5 NEEDLE (SUTURE) ×2 IMPLANT
SUT ETHIBOND 3-0 V-5 (SUTURE) ×2 IMPLANT
SUT STEEL 4 (SUTURE) ×2 IMPLANT
SUT VICRYL RAPIDE 4-0 (SUTURE) ×2 IMPLANT
SUT VICRYL RAPIDE 4/0 PS 2 (SUTURE) ×2 IMPLANT
SYR BULB 3OZ (MISCELLANEOUS) ×2 IMPLANT
SYRINGE 10CC LL (SYRINGE) ×2 IMPLANT
SYSTEM CHEST DRAIN TLS 7FR (DRAIN) IMPLANT
TOWEL OR 17X24 6PK STRL BLUE (TOWEL DISPOSABLE) ×2 IMPLANT
UNDERPAD 30X30 INCONTINENT (UNDERPADS AND DIAPERS) ×2 IMPLANT

## 2013-12-08 NOTE — Op Note (Signed)
12/08/2013  8:04 AM  PATIENT:  Latoya [name]  63 y.o. female  PRE-OPERATIVE DIAGNOSIS:  Right TMC osteoarthritis  POST-OPERATIVE DIAGNOSIS:  Same  PROCEDURE:  Right thumb suspension arthroplasty (LRTI) with EPB transfer, including trapeziectomy and hemi-trapezoidectomy  SURGEON: Rayvon Char. Grandville Silos, MD  PHYSICIAN ASSISTANT: None  ANESTHESIA:  general  SPECIMENS:  None  DRAINS:   None  PREOPERATIVE INDICATIONS:  Latoya [name] is a  63 y.o. female with right thumb TMC osteoarthritis, which has failed to respond well to nonoperative measures more than transiently.  The risks benefits and alternatives were discussed with the patient preoperatively including but not limited to the risks of infection, bleeding, nerve injury, cardiopulmonary complications, the need for revision surgery, among others, and the patient verbalized understanding and consented to proceed.  OPERATIVE IMPLANTS: None  OPERATIVE PROCEDURE:  After receiving prophylactic antibiotics, the patient was escorted to the operative theatre and placed in a supine position. General anesthesia was administered A surgical "time-out" was performed during which the planned procedure, proposed operative site, and the correct patient identity were compared to the operative consent and agreement confirmed by the circulating nurse according to current facility policy.  Following application of a tourniquet to the operative extremity, the exposed skin was prepped with Chloraprep and draped in the usual sterile fashion.  The limb was exsanguinated with an Esmarch bandage and the tourniquet inflated to approximately 181mmHg higher than systolic BP.  A Wegener incision was marked and made sharply with a scalpel. Subcutaneous tissues were dissected with blunt spreading dissection. The thenar musculature was reflected extra periosteally and the joint entered longitudinally along the volar margin of the APL tendon. The trapezium was exposed  through subperiosteal dissection carried out sharply both across the dorsum as well as the volar side, with care to preserve the FCR tendon. The joint was inspected, found to be badly arthritic with areas of eburnated bone and the trapezium was excised piecemeal, protecting the FCR. Once the trapezium was excised, the scaphotrapezoid joint was inspected,Found to have severe degenerative changes as well, and the proximal half of the trapezoid was excised as well. The base of the metacarpal was removed with an oscillating saw, and then on the dorsum, the EPB tendon was retracted ulnarly and a tunnel made in the metacarpal for passage of the tendon interposition, first with a 0.062 inch K wire and then successively larger drillbits up to 11/64.A more proximal incision was made near the muscle-tendon junction of the FCR obliquely. The ulnar half strip of FCR was harvested in standard fashion down to its insertion on the second metacarpal base. A 3-0 Tycron suture was then placed in the depths of the wound after the interposition space was irrigated copiously and the tendon for her position was brought through the tunnel and the metacarpal, back down to the dorsal capsule and tied in a simple knot upon its intact portion. This was tensioned with the stone either distracted and compressed. The tendon was secured to the dorsal periosteum with 3-0 Tycron interrupted suture and the suture knot was secured with the same type of stitch to prevent its on tying or unraveling. The wound was again irrigated and the remainder of the tendon fanfolded and secured by the deep stitch that had been placed in the interposition space thus securing all of the tendon and knot interposition material in the interposition space.The capsule was then closed with 3-0 Tycron suture.The EPB tendon was then transferred to the APL insertion on the base  of the metacarpal, secured with 3-0 Tycron suture before being divided distal to the site of  tenodesis, thus effectively transferring it to the base of the metacarpal. Half percent Marcaine With epinephrinewas instilled in the skin edges and the tourniquet was released. Some additional hemostasis was obtained and the wound was again irrigated.The skin was closed with 4-0 Vicryl Rapide Horizontal mattress running suture and a bulky dressing was applied with a thumb spica plaster component. The patient was awakened and taken to the recovery room in stable condition breathing spontaneously.  DISPOSITION: The patient will be discharged home today with several instructions return in 10-15 days for reevaluation, at which time she should have a follow up one with hand therapy to have a forearm based thumb spica splint constructed begin rehabilitation.

## 2013-12-08 NOTE — Interval H&P Note (Signed)
History and Physical Interval Note:  12/08/2013 8:04 AM  Latoya [name]  has presented today for surgery, with the diagnosis of right tarsal metacarpal osteoarthritis  The various methods of treatment have been discussed with the patient and family. After consideration of risks, benefits and other options for treatment, the patient has consented to  Procedure(s): CARPOMETACARPEL ([place]) SUSPENSION PLASTY (Right) as a surgical intervention .  The patient's history has been reviewed, patient examined, no change in status, stable for surgery.  I have reviewed the patient's chart and labs.  Questions were answered to the patient's satisfaction.     [name] A.

## 2013-12-08 NOTE — Transfer of Care (Signed)
Immediate Anesthesia Transfer of Care Note  Patient: Latoya [name]  Procedure(s) Performed: Procedure(s): CARPOMETACARPEL ([place]) SUSPENSION PLASTY (Right)  Patient Location: PACU  Anesthesia Type:General  Level of Consciousness: awake, sedated and patient cooperative  Airway & Oxygen Therapy: Patient Spontanous Breathing and Patient connected to face mask oxygen  Post-op Assessment: Report given to PACU RN and Post -op Vital signs reviewed and stable  Post vital signs: Reviewed and stable  Complications: No apparent anesthesia complications

## 2013-12-08 NOTE — Anesthesia Postprocedure Evaluation (Signed)
  Anesthesia Post-op Note  Patient: Latoya [name]  Procedure(s) Performed: Procedure(s): CARPOMETACARPEL ([place]) SUSPENSION PLASTY (Right)  Patient Location: PACU  Anesthesia Type:General  Level of Consciousness: awake, alert , oriented and patient cooperative  Airway and Oxygen Therapy: Patient Spontanous Breathing  Post-op Pain: mild  Post-op Assessment: Post-op Vital signs reviewed, Patient's Cardiovascular Status Stable, Respiratory Function Stable, Patent Airway, No signs of Nausea or vomiting and Pain level controlled  Post-op Vital Signs: Reviewed and stable  Complications: No apparent anesthesia complications

## 2013-12-08 NOTE — Discharge Instructions (Signed)
Discharge Instructions ° ° °You have a dressing with a plaster splint incorporated in it. °Move your fingers as much as possible, making a full fist and fully opening the fist. °Elevate your hand to reduce pain & swelling of the digits.  Ice over the operative site may be helpful to reduce pain & swelling.  DO NOT USE HEAT. °Pain medicine has been prescribed for you.  °Use your medicine as needed over the first 48 hours, and then you can begin to taper your use.  You may use Tylenol in place of your prescribed pain medication, but not IN ADDITION to it. °Leave the dressing in place until you return to our office.  °You may shower, but keep the bandage clean & dry.  °You may drive a car when you are off of prescription pain medications and can safely control your vehicle with both hands. °Our office will call you to arrange follow-up ° ° °Please call [phone] during normal business hours or [phone] after hours for any problems. Including the following: ° °- excessive redness of the incisions °- drainage for more than 4 days °- fever of more than 101.5 F ° °*Please note that pain medications will not be refilled after hours or on weekends. ° ° °Post Anesthesia Home Care Instructions ° °Activity: °Get plenty of rest for the remainder of the day. A responsible adult should stay with you for 24 hours following the procedure.  °For the next 24 hours, DO NOT: °-Drive a car °-Operate machinery °-Drink alcoholic beverages °-Take any medication unless instructed by your physician °-Make any legal decisions or sign important papers. ° °Meals: °Start with liquid foods such as gelatin or soup. Progress to regular foods as tolerated. Avoid greasy, spicy, heavy foods. If nausea and/or vomiting occur, drink only clear liquids until the nausea and/or vomiting subsides. Call your physician if vomiting continues. ° °Special Instructions/Symptoms: °Your throat may feel dry or sore from the anesthesia or the breathing tube  placed in your throat during surgery. If this causes discomfort, gargle with warm salt water. The discomfort should disappear within 24 hours. ° °

## 2013-12-08 NOTE — Anesthesia Procedure Notes (Signed)
Procedure Name: LMA Insertion Date/Time: 12/08/2013 8:35 AM Performed by: Lyndee Leo Pre-anesthesia Checklist: Patient identified, Emergency Drugs available, Suction available and Patient being monitored Patient Re-evaluated:Patient Re-evaluated prior to inductionOxygen Delivery Method: Circle System Utilized Preoxygenation: Pre-oxygenation with 100% oxygen Intubation Type: IV induction Ventilation: Mask ventilation without difficulty LMA: LMA inserted LMA Size: 3.0 Number of attempts: 1 Airway Equipment and Method: bite block Placement Confirmation: positive ETCO2 Tube secured with: Tape Dental Injury: Teeth and Oropharynx as per pre-operative assessment

## 2013-12-08 NOTE — Anesthesia Preprocedure Evaluation (Signed)
Anesthesia Evaluation  Patient identified by MRN, date of birth, ID band Patient awake    Reviewed: Allergy & Precautions, H&P , NPO status , Patient's Chart, lab work & pertinent test results  History of Anesthesia Complications Negative for: history of anesthetic complications  Airway Mallampati: II TM Distance: >3 FB Neck ROM: Full    Dental  (+) Caps, Dental Advisory Given   Pulmonary asthma ,  breath sounds clear to auscultation  Pulmonary exam normal       Cardiovascular hypertension, Pt. on medications - anginaRhythm:Regular Rate:Normal     Neuro/Psych Depression negative neurological ROS     GI/Hepatic Neg liver ROS, GERD-  Medicated,  Endo/Other  negative endocrine ROSMorbid obesity  Renal/GU negative Renal ROS     Musculoskeletal   Abdominal (+) + obese,   Peds  Hematology   Anesthesia Other Findings   Reproductive/Obstetrics                           Anesthesia Physical Anesthesia Plan  ASA: II  Anesthesia Plan: General   Post-op Pain Management:    Induction: Intravenous  Airway Management Planned: LMA  Additional Equipment:   Intra-op Plan:   Post-operative Plan: Extubation in OR  Informed Consent: I have reviewed the patients History and Physical, chart, labs and discussed the procedure including the risks, benefits and alternatives for the proposed anesthesia with the patient or authorized representative who has indicated his/her understanding and acceptance.   Dental advisory given  Plan Discussed with: CRNA and Surgeon  Anesthesia Plan Comments: (Plan routine monitors, GA- LMA OK)        Anesthesia Quick Evaluation

## 2013-12-09 ENCOUNTER — Encounter (HOSPITAL_BASED_OUTPATIENT_CLINIC_OR_DEPARTMENT_OTHER): Payer: Self-pay | Admitting: Orthopedic Surgery

## 2013-12-22 ENCOUNTER — Encounter: Payer: Self-pay | Admitting: Emergency Medicine

## 2013-12-22 ENCOUNTER — Ambulatory Visit (INDEPENDENT_AMBULATORY_CARE_PROVIDER_SITE_OTHER): Payer: 59 | Admitting: Emergency Medicine

## 2013-12-22 VITALS — BP 138/80 | HR 82 | Temp 98.0°F | Resp 18 | Ht 60.0 in | Wt 156.0 lb

## 2013-12-22 DIAGNOSIS — F39 Unspecified mood [affective] disorder: Secondary | ICD-10-CM

## 2013-12-22 MED ORDER — CLONAZEPAM 2 MG PO TABS: ORAL_TABLET | ORAL | Status: DC

## 2013-12-22 NOTE — Patient Instructions (Signed)
Mood Disorders  Mood disorders are conditions that affect the way a person feels emotionally. The main mood disorders include:  · Depression.  · Bipolar disorder.  · Dysthymia. Dysthymia is a mild, lasting (chronic) depression. Symptoms of dysthymia are similar to depression, but not as severe.  · Cyclothymia. Cyclothymia includes mood swings, but the highs and lows are not as severe as they are in bipolar disorder. Symptoms of cyclothymia are similar to those of bipolar disorder, but less extreme.  CAUSES   Mood disorders are probably caused by a combination of factors. People with mood disorders seem to have physical and chemical changes in their brains. Mood disorders run in families, so there may be genetic causes. Severe trauma or stressful life events may also increase the risk of mood disorders.   SYMPTOMS   Symptoms of mood disorders depend on the specific type of condition.  Depression symptoms include:  · Feeling sad, worthless, or hopeless.  · Negative thoughts.  · Inability to enjoy one's usual activities.  · Low energy.  · Sleeping too much or too little.  · Appetite changes.  · Crying.  · Concentration problems.  · Thoughts of harming oneself.  Bipolar disorder symptoms include:  · Periods of depression (see above symptoms).  · Mood swings, from sadness and depression, to abnormal elation and excitement.  · Periods of mania:  · Racing thoughts.  · Fast speech.  · Poor judgment, and careless, dangerous choices.  · Decreased need for sleep.  · Risky behavior.  · Difficulty concentrating.  · Irritability.  · Increased energy.  · Increased sex drive.  DIAGNOSIS   There are no blood tests or X-rays that can confirm a mood disorder. However, your caregiver may choose to run some tests to make sure that there is not another physical cause for your symptoms. A mood disorder is usually diagnosed after an in-depth interview with a caregiver.  TREATMENT   Mood disorders can be treated with one or more of the  following:  · Medicine. This may include antidepressants, mood-stabilizers, or anti-psychotics.  · Psychotherapy (talk therapy).  · Cognitive behavioral therapy. You are taught to recognize negative thoughts and behavior patterns, and replace them with healthy thoughts and behaviors.  · Electroconvulsive therapy. For very severe cases of deep depression, a series of treatments in which an electrical current is applied to the brain.  · Vagus nerve stimulation. A pulse of electricity is applied to a portion of the brain.  · Transcranial magnetic stimulation. Powerful magnets are placed on the head that produce electrical currents.  · Hospitalization. In severe situations, or when someone is having serious thoughts of harming him or herself, hospitalization may be necessary in order to keep the person safe. This is also done to quickly start and monitor treatment.  HOME CARE INSTRUCTIONS   · Take your medicine exactly as directed.  · Attend all of your therapy sessions.  · Try to eat regular, healthy meals.  · Exercise daily. Exercise may improve mood symptoms.  · Get good sleep.  · Do not drink alcohol or use pot or other drugs. These can worsen mood symptoms and cause anxiety and psychosis.  · Tell your caregiver if you develop any side effects, such as feeling sick to your stomach (nauseous), dry mouth, dizziness, constipation, drowsiness, tremor, weight gain, or sexual symptoms. He or she may suggest things you can do to improve symptoms.  · Learn ways to cope with the stress of having a   chronic illness. This includes yoga, meditation, tai chi, or participating in a support group.  · Drink enough water to keep your urine clear or pale yellow. Eat a high-fiber diet. These habits may help you avoid constipation from your medicine.  SEEK IMMEDIATE MEDICAL CARE IF:  · Your mood worsens.  · You have thoughts of hurting yourself or others.  · You cannot care for yourself.  · You develop the sensation of hearing or seeing  something that is not actually present (auditory or visual hallucinations).  · You develop abnormal thoughts.  Document Released: 06/18/2009 Document Revised: 11/13/2011 Document Reviewed: 06/18/2009  ExitCare® Patient Information ©2014 [employer].
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
## 2013-12-24 ENCOUNTER — Ambulatory Visit: Payer: Self-pay | Admitting: Emergency Medicine

## 2014-01-03 ENCOUNTER — Other Ambulatory Visit: Payer: Self-pay | Admitting: Emergency Medicine

## 2014-01-16 ENCOUNTER — Ambulatory Visit (INDEPENDENT_AMBULATORY_CARE_PROVIDER_SITE_OTHER): Payer: 59 | Admitting: Psychiatry

## 2014-01-16 ENCOUNTER — Encounter (INDEPENDENT_AMBULATORY_CARE_PROVIDER_SITE_OTHER): Payer: Self-pay

## 2014-01-16 DIAGNOSIS — F4323 Adjustment disorder with mixed anxiety and depressed mood: Secondary | ICD-10-CM

## 2014-01-16 DIAGNOSIS — F4322 Adjustment disorder with anxiety: Secondary | ICD-10-CM

## 2014-01-16 MED ORDER — BUSPIRONE HCL 10 MG PO TABS: ORAL_TABLET | ORAL | Status: DC

## 2014-01-16 MED ORDER — CLONAZEPAM 0.5 MG PO TABS: ORAL_TABLET | ORAL | Status: DC
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
## 2014-01-21 ENCOUNTER — Telehealth: Payer: Self-pay

## 2014-01-21 NOTE — Telephone Encounter (Signed)
Pt called requesting referral to see Dr.Thompson and referral for behavioral health. Pt is already established @  and does not need a referral. Referral was submitted to insurance and 6 visits are approved for pt to see Dr.Thompson. Pt aware.

## 2014-01-28 ENCOUNTER — Telehealth (HOSPITAL_COMMUNITY): Payer: Self-pay

## 2014-01-28 NOTE — Telephone Encounter (Signed)
Patient left VM:Not sure how to take the depression tablet-didn't increase it like she was supposed to.And he only gave enough Klonopin to last 2 weeks- what is she supposed to do about her nerves. Contacted patient: Instructed patient on medications as ordered by MD last appt: Buspirone 10 mg: Take one twice day with food, then take two twice day with food  Clonazepam 0.5mg : Take one twice a day for 7 days, then take one at bedtime.Then stop taking it. Informed pt this should be done at the same time.Helpful if she can write it on calendar. Patient stated she had not started taking the Buspirone two twice daily yet, that she will start today. Wondered what she is to take for nerves if she has to stop Klonopin. Informed pt that Buspirone will help nerves. Patient verbalized understanding of instructions and repeated to this Probation officer. Advised to call for questions or concerns

## 2014-01-29 ENCOUNTER — Ambulatory Visit (HOSPITAL_COMMUNITY): Payer: Self-pay | Admitting: Marriage and Family Therapist

## 2014-02-04 ENCOUNTER — Telehealth: Payer: Self-pay | Admitting: *Deleted

## 2014-02-04 NOTE — Telephone Encounter (Signed)
Patient called and left detailed message with front/telephone staff.  I received a written message from them stating patient's concerns about increasing symptoms of leg/feet cramping and swollen ankles last night.  Patient advised them she took a fluid pill last night and symptoms of swelling have decreased.  Per Kelby Aline, PA-C orders, I advised patient that she needs to increase the Potassium and Magnesium in her diet and make certain she is taking her B12 vitamin qd.  Patient states her symptoms have decreased and advised her to elevated her legs throughout the day to help alleviate edema.  Advised her she will need an OV for further evaluation and labs if symptoms continue.  Patient notes understanding and will call for ov if needed.

## 2014-02-10 ENCOUNTER — Encounter: Payer: Self-pay | Admitting: Emergency Medicine

## 2014-02-10 ENCOUNTER — Ambulatory Visit (INDEPENDENT_AMBULATORY_CARE_PROVIDER_SITE_OTHER): Payer: 59 | Admitting: Emergency Medicine

## 2014-02-10 VITALS — BP 120/70 | HR 74 | Temp 98.0°F | Resp 18 | Ht 65.0 in | Wt 157.0 lb

## 2014-02-10 DIAGNOSIS — N951 Menopausal and female climacteric states: Secondary | ICD-10-CM

## 2014-02-10 DIAGNOSIS — D239 Other benign neoplasm of skin, unspecified: Secondary | ICD-10-CM

## 2014-02-10 DIAGNOSIS — R232 Flushing: Secondary | ICD-10-CM

## 2014-02-10 DIAGNOSIS — R609 Edema, unspecified: Secondary | ICD-10-CM

## 2014-02-10 LAB — CBC WITH DIFFERENTIAL/PLATELET
Basophils Absolute: 0 10*3/uL (ref 0.0–0.1)
Basophils Relative: 0 % (ref 0–1)
Eosinophils Absolute: 0.1 10*3/uL (ref 0.0–0.7)
Eosinophils Relative: 1 % (ref 0–5)
HCT: 40.8 % (ref 36.0–46.0)
Hemoglobin: 14.1 g/dL (ref 12.0–15.0)
Lymphocytes Relative: 27 % (ref 12–46)
Lymphs Abs: 2.1 10*3/uL (ref 0.7–4.0)
MCH: 30.5 pg (ref 26.0–34.0)
MCHC: 34.6 g/dL (ref 30.0–36.0)
MCV: 88.3 fL (ref 78.0–100.0)
Monocytes Absolute: 0.8 10*3/uL (ref 0.1–1.0)
Monocytes Relative: 10 % (ref 3–12)
Neutro Abs: 4.8 10*3/uL (ref 1.7–7.7)
Neutrophils Relative %: 62 % (ref 43–77)
Platelets: 231 10*3/uL (ref 150–400)
RBC: 4.62 MIL/uL (ref 3.87–5.11)
RDW: 13.5 % (ref 11.5–15.5)
WBC: 7.7 10*3/uL (ref 4.0–10.5)

## 2014-02-10 NOTE — Patient Instructions (Signed)
Edema °Edema is a buildup of fluids. It is most common in the feet, ankles, and legs. This happens more as a person ages. It may affect one or both legs. °HOME CARE  °· Raise (elevate) the legs or ankles above the level of the heart while lying down. °· Avoid sitting or standing still for a long time. °· Exercise the legs to help the puffiness (swelling) go down. °· A low-salt diet may help lessen the puffiness. °· Only take medicine as told by your doctor. °GET HELP RIGHT AWAY IF:  °· You develop shortness of breath or chest pain. °· You cannot breathe when you lie down. °· You have more puffiness that does not go away with treatment. °· You develop pain or redness in the areas that are puffy. °· You have a temperature by mouth above 102° F (38.9° C), not controlled by medicine. °· You gain 03 lb/1.4 kg or more in 1 day or 05 lb/2.3 kg in a week. °MAKE SURE YOU:  °· Understand these instructions. °· Will watch your condition. °· Will get help right away if you are not doing well or get worse. °Document Released: 02/07/2008 Document Revised: 11/13/2011 Document Reviewed: 02/07/2008 °[employer]® Patient Information ©2014 [employer]. ° °

## 2014-02-10 NOTE — Progress Notes (Signed)
Subjective:    Patient ID: Latoya [name], female    DOB: May 27, 1951, 63 y.o.   MRN: [id number]  HPI Comments: 63 yo WF with multiple concerns. She sees Dr [place] for eye exam, last visit was last year. She is having more floaters in eyes but notes clears up on it's own. She denies any pain or vision change otherwise.  She only takes Bumex when she notices leg swelling. She notes legs have been a little more swollen over last couple of weeks. Husband notes she is on feet all the time and will not prop her legs up.  She has f/u Dr Casimiro Needle next month for medication management. She is slowly increased Buspar and feels better but husband notes she is still agitated easily. She notes hot flashes have increased a little as well which make her irritable. WBC             4.4   11/26/2013 HGB            14.7   11/26/2013 HCT            42.9   11/26/2013 PLT             211   11/26/2013 GLUCOSE          87   11/26/2013 CHOL            166   11/26/2013 TRIG            143   11/26/2013 HDL              48   11/26/2013 LDLCALC          89   11/26/2013 ALT              15   11/26/2013 AST              24   11/26/2013 NA              141   11/26/2013 K               4.8   11/26/2013 CL              106   11/26/2013 CREATININE     0.80   11/26/2013 BUN              14   11/26/2013 CO2              26   11/26/2013 TSH           1.617   11/26/2013 HGBA1C          5.8   11/26/2013     Medication List       This list is accurate as of: 02/10/14 11:18 AM.  Always use your most recent med list.               aspirin 81 MG tablet  Take 81 mg by mouth daily.     Biotin 1000 MCG tablet  Take 1,000 mcg by mouth 3 (three) times daily.     bumetanide 2 MG tablet  Commonly known as:  BUMEX  Take 2 mg by mouth 3 (three) times daily as needed.     busPIRone 10 MG tablet  Commonly known as:  BUSPAR  1 bid  With food  For 1 week then 2 qam  2 q diner     cholecalciferol 1000 UNITS tablet  Commonly known as:   VITAMIN D  Take 5,000 Units by mouth daily.     clonazePAM 0.5 MG tablet  Commonly known as:  KLONOPIN  1 bid for  1 week then 1  qhs  For 1 week then stop it     diltiazem 120 MG tablet  Commonly known as:  CARDIZEM  TAKE 1 TABLET BY MOUTH TWICE DAILY     Magnesium 250 MG Tabs  Take 1 tablet by mouth daily.     pantoprazole 40 MG tablet  Commonly known as:  PROTONIX  Take 30- 60 min before your first and last meals of the day     potassium chloride SA 20 MEQ tablet  Commonly known as:  K-DUR,KLOR-CON  TAKE 1 TABLET BY MOUTH TWICE DAILY     pravastatin 40 MG tablet  Commonly known as:  PRAVACHOL  Take 40 mg by mouth at bedtime.     sucralfate 1 G tablet  Commonly known as:  CARAFATE  Take 1 tablet (1 g total) by mouth 4 (four) times daily.     vitamin C 500 MG tablet  Commonly known as:  ASCORBIC ACID  Take 500 mg by mouth daily.       Allergies  Allergen Reactions  . Other     All pain medications: Unknown/ CODEINE  . Prednisone Other (See Comments)    DYSPHORIA  . Vioxx [Rofecoxib]   . Entex T [Pseudoephedrine-Guaifenesin] Palpitations  . Levaquin [Levofloxacin] Rash  . Penicillins Rash  . Sulfa Antibiotics Rash  . Trovan [Alatrofloxacin] Rash   Past Medical History  Diagnosis Date  . Hypertension   . Hyperlipemia   . Anxiety and depression   . Arthritis   . Colon polyps   . Hemorrhoids   . GERD (gastroesophageal reflux disease)   . Panic disorder   . Kidney stones   . Stomach ulcer   . Unspecified essential hypertension 11/26/2013  . Mixed hyperlipidemia 11/26/2013      Review of Systems  Eyes: Positive for visual disturbance.  Cardiovascular: Positive for leg swelling.  Endocrine: Positive for heat intolerance.  Psychiatric/Behavioral: Positive for agitation.  All other systems reviewed and are negative.  BP 120/70  Pulse 74  Temp(Src) 98 F (36.7 C) (Temporal)  Resp 18  Ht 5\' 5"  (1.651 m)  Wt 157 lb (71.215 kg)  BMI 26.13 kg/m2      Objective:   Physical Exam  Nursing note and vitals reviewed. Constitutional: She is oriented to person, place, and time. She appears well-developed and well-nourished. No distress.  HENT:  Head: Normocephalic and atraumatic.  Right Ear: External ear normal.  Left Ear: External ear normal.  Nose: Nose normal.  Mouth/Throat: Oropharynx is clear and moist.  Eyes: Conjunctivae and EOM are normal. Pupils are equal, round, and reactive to light.  Neck: Normal range of motion. Neck supple. No JVD present. No thyromegaly present.  Cardiovascular: Normal rate, regular rhythm, normal heart sounds and intact distal pulses.   1 + bilateral LE edema  Pulmonary/Chest: Effort normal and breath sounds normal.  Abdominal: Soft. Bowel sounds are normal. She exhibits no distension and no mass. There is no tenderness. There is no rebound and no guarding.  Musculoskeletal: Normal range of motion. She exhibits no edema and no tenderness.  Lymphadenopathy:    She has no cervical adenopathy.  Neurological: She is alert and oriented to person, place, and time. No cranial nerve deficit.  Skin: Skin is warm and dry. No rash noted. No erythema. No pallor.  Right mid  low back irregular dark 2-3 mm  Psychiatric: She has a normal mood and affect. Her behavior is normal. Judgment and thought content normal.  Mood seems more controlled          Assessment & Plan:  1. EDEMA- ? Change of Fluid pill, check labs, advised elevation, decrease sodium, pineapple QD, increase H2o  2.Hot flashes vs mood swings- Advise will need to discuss medication changes with Psych, check labs  3. Dysplastic Nevi- Ref DERM  4. EYE "floaters"- advised repeat EYE doctor eval

## 2014-02-11 ENCOUNTER — Telehealth (HOSPITAL_COMMUNITY): Payer: Self-pay

## 2014-02-11 LAB — BASIC METABOLIC PANEL WITH GFR
BUN: 14 mg/dL (ref 6–23)
CO2: 26 mEq/L (ref 19–32)
Calcium: 9.5 mg/dL (ref 8.4–10.5)
Chloride: 103 mEq/L (ref 96–112)
Creat: 1.24 mg/dL — ABNORMAL HIGH (ref 0.50–1.10)
GFR, Est African American: 53 mL/min — ABNORMAL LOW
GFR, Est Non African American: 46 mL/min — ABNORMAL LOW
Glucose, Bld: 89 mg/dL (ref 70–99)
Potassium: 4.1 mEq/L (ref 3.5–5.3)
Sodium: 141 mEq/L (ref 135–145)

## 2014-02-11 LAB — TSH: TSH: 2.164 u[IU]/mL (ref 0.350–4.500)

## 2014-02-12 ENCOUNTER — Encounter: Payer: Self-pay | Admitting: Emergency Medicine

## 2014-02-12 ENCOUNTER — Ambulatory Visit (INDEPENDENT_AMBULATORY_CARE_PROVIDER_SITE_OTHER): Payer: 59 | Admitting: Emergency Medicine

## 2014-02-12 ENCOUNTER — Ambulatory Visit (INDEPENDENT_AMBULATORY_CARE_PROVIDER_SITE_OTHER): Payer: 59 | Admitting: Licensed Clinical Social Worker

## 2014-02-12 VITALS — BP 138/74 | HR 76 | Temp 98.4°F | Resp 20 | Ht 65.0 in | Wt 159.0 lb

## 2014-02-12 DIAGNOSIS — F4320 Adjustment disorder, unspecified: Secondary | ICD-10-CM

## 2014-02-12 DIAGNOSIS — R002 Palpitations: Secondary | ICD-10-CM

## 2014-02-12 NOTE — Progress Notes (Signed)
Subjective:    Patient ID: Latoya [name], female    DOB: November 23, 1950, 63 y.o.   MRN: [id number]  HPI Comments: 63 yo WF with recent labs NEG comes in for concerns with new psych medicine. She states she called Psych office and they advised ER if she did not feel better. She notes since starting medicine she is staying mildly nauseated in the a.m. And has noted right side arm/ leg shaking in the a.m. She notes symptoms improve with taking 1/2 of nausea pill and getting up moving around. She notes mood is up and down but she feels she is learning how to control it. She notes today she felt like heart was flipping around and wanted to get it checked out. Spouse this she's having panic attacks. She denies any new stresses.     Medication List       This list is accurate as of: 02/12/14 11:54 AM.  Always use your most recent med list.               aspirin 81 MG tablet  Take 81 mg by mouth daily.     Biotin 1000 MCG tablet  Take 1,000 mcg by mouth 3 (three) times daily.     bumetanide 2 MG tablet  Commonly known as:  BUMEX  Take 2 mg by mouth 3 (three) times daily as needed.     busPIRone 10 MG tablet  Commonly known as:  BUSPAR  1 bid  With food  For 1 week then 2 qam  2 q diner     cholecalciferol 1000 UNITS tablet  Commonly known as:  VITAMIN D  Take 5,000 Units by mouth daily.     clonazePAM 0.5 MG tablet  Commonly known as:  KLONOPIN  1 bid for  1 week then 1  qhs  For 1 week then stop it     diltiazem 120 MG tablet  Commonly known as:  CARDIZEM  TAKE 1 TABLET BY MOUTH TWICE DAILY     Magnesium 250 MG Tabs  Take 1 tablet by mouth daily.     pantoprazole 40 MG tablet  Commonly known as:  PROTONIX  Take 30- 60 min before your first and last meals of the day     potassium chloride SA 20 MEQ tablet  Commonly known as:  K-DUR,KLOR-CON  TAKE 1 TABLET BY MOUTH TWICE DAILY     pravastatin 40 MG tablet  Commonly known as:  PRAVACHOL  Take 40 mg by mouth at bedtime.      sucralfate 1 G tablet  Commonly known as:  CARAFATE  Take 1 tablet (1 g total) by mouth 4 (four) times daily.     vitamin C 500 MG tablet  Commonly known as:  ASCORBIC ACID  Take 500 mg by mouth daily.       Allergies  Allergen Reactions  . Other     All pain medications: Unknown/ CODEINE  . Prednisone Other (See Comments)    DYSPHORIA  . Vioxx [Rofecoxib]   . Entex T [Pseudoephedrine-Guaifenesin] Palpitations  . Levaquin [Levofloxacin] Rash  . Penicillins Rash  . Sulfa Antibiotics Rash  . Trovan [Alatrofloxacin] Rash   Past Medical History  Diagnosis Date  . Hypertension   . Hyperlipemia   . Anxiety and depression   . Arthritis   . Colon polyps   . Hemorrhoids   . GERD (gastroesophageal reflux disease)   . Panic disorder   . Kidney stones   .  Stomach ulcer   . Unspecified essential hypertension 11/26/2013  . Mixed hyperlipidemia 11/26/2013      Review of Systems  Cardiovascular: Positive for palpitations.  Gastrointestinal: Positive for nausea.  Neurological: Positive for tremors.  Psychiatric/Behavioral: The patient is nervous/anxious.   All other systems reviewed and are negative.  BP 138/74  Pulse 76  Temp(Src) 98.4 F (36.9 C) (Temporal)  Resp 20  Ht 5\' 5"  (1.651 m)  Wt 159 lb (72.122 kg)  BMI 26.46 kg/m2     Objective:   Physical Exam  Nursing note and vitals reviewed. Constitutional: She is oriented to person, place, and time. She appears well-developed and well-nourished.  HENT:  Head: Normocephalic and atraumatic.  Eyes: Conjunctivae are normal.  Neck: Normal range of motion.  Cardiovascular: Normal rate, regular rhythm, normal heart sounds and intact distal pulses.   Pulmonary/Chest: Effort normal and breath sounds normal.  Abdominal: Soft. Bowel sounds are normal. She exhibits no distension and no mass. There is no tenderness. There is no rebound and no guarding.  Musculoskeletal: Normal range of motion.  Neurological: She is alert and  oriented to person, place, and time.  Skin: Skin is warm and dry.  Psychiatric: She has a normal mood and affect. Her behavior is normal. Judgment normal.  Normal for patient with some improvement with overall mood/ temper.      EKG NSCSPT WNL     Assessment & Plan:  1. ? Palpitations vs anxiety attack- ADvise if sx increase ER, f/u at Psych to discuss medicine and possible SE. Advised to continue counseling to decrease stress. Advised try to eat small portions through out day to help with nausea with medicine

## 2014-02-12 NOTE — Patient Instructions (Signed)
Palpitations  A palpitation is the feeling that your heartbeat is irregular. It may feel like your heart is fluttering or skipping a beat. It may also feel like your heart is beating faster than normal. This is usually not a serious problem. In some cases, you may need more medical tests. HOME CARE  Avoid:  Caffeine in coffee, tea, soft drinks, diet pills, and energy drinks.  Chocolate.  Alcohol.  Stop smoking if you smoke.  Reduce your stress and anxiety. Try:  A method that measures bodily functions so you can learn to control them (biofeedback).  Yoga.  Meditation.  Physical activity such as swimming, jogging, or walking.  Get plenty of rest and sleep. GET HELP RIGHT AWAY IF:   You have chest pain.  You feel short of breath.  You have a very bad headache.  You feel dizzy or pass out (faint).  Your fast or irregular heartbeat continues after 24 hours.  Your palpitations occur more often. MAKE SURE YOU:   Understand these instructions.  Will watch your condition.  Will get help right away if you are not doing well or get worse. Document Released: 05/30/2008 Document Revised: 02/20/2012 Document Reviewed: 10/20/2011 [facility] Patient Information 2014 [place].

## 2014-02-25 ENCOUNTER — Ambulatory Visit (HOSPITAL_COMMUNITY): Payer: Self-pay | Admitting: Psychiatry

## 2014-03-02 ENCOUNTER — Other Ambulatory Visit: Payer: Self-pay | Admitting: Emergency Medicine

## 2014-03-11 ENCOUNTER — Encounter: Payer: Self-pay | Admitting: Emergency Medicine

## 2014-03-11 ENCOUNTER — Ambulatory Visit (INDEPENDENT_AMBULATORY_CARE_PROVIDER_SITE_OTHER): Payer: 59 | Admitting: Emergency Medicine

## 2014-03-11 ENCOUNTER — Other Ambulatory Visit: Payer: Self-pay | Admitting: Emergency Medicine

## 2014-03-11 VITALS — BP 142/86 | HR 76 | Temp 98.2°F | Resp 18 | Ht 65.0 in | Wt 155.0 lb

## 2014-03-11 DIAGNOSIS — M79609 Pain in unspecified limb: Secondary | ICD-10-CM

## 2014-03-11 DIAGNOSIS — M79674 Pain in right toe(s): Secondary | ICD-10-CM

## 2014-03-11 LAB — URIC ACID: Uric Acid, Serum: 5.1 mg/dL (ref 2.4–7.0)

## 2014-03-11 NOTE — Progress Notes (Signed)
Subjective:    Patient ID: Latoya [name], female    DOB: [birth year], 63 y.o.   MRN: [id number]  HPI Comments: 63 yo WF woke up this morning with right toe pain. She denies any injury or hx of Gout. She does not drink ETOH nor eat a lot of meat. She has not tried any OTC for relief bu note 6-7/10 of pain.  Toe Pain      Medication List       This list is accurate as of: 03/11/14 11:54 AM.  Always use your most recent med list.               aspirin 81 MG tablet  Take 81 mg by mouth daily.     Biotin 1000 MCG tablet  Take 1,000 mcg by mouth 3 (three) times daily.     bumetanide 2 MG tablet  Commonly known as:  BUMEX  Take 2 mg by mouth 3 (three) times daily as needed.     cholecalciferol 1000 UNITS tablet  Commonly known as:  VITAMIN D  Take 5,000 Units by mouth daily.     clonazePAM 2 MG tablet  Commonly known as:  KLONOPIN  TAKE 1/2 TABLET BY MOUTH TWICE DAILY AND 1 TABLET BY MOUTH EVERY NIGHT AT BEDTIME     diltiazem 120 MG tablet  Commonly known as:  CARDIZEM  TAKE 1 TABLET BY MOUTH TWICE DAILY     Magnesium 250 MG Tabs  Take 1 tablet by mouth daily.     pantoprazole 40 MG tablet  Commonly known as:  PROTONIX  Take 30- 60 min before your first and last meals of the day     potassium chloride SA 20 MEQ tablet  Commonly known as:  K-DUR,KLOR-CON  TAKE 1 TABLET BY MOUTH TWICE DAILY     potassium chloride SA 20 MEQ tablet  Commonly known as:  K-DUR,KLOR-CON  TAKE 1 TABLET BY MOUTH TWICE DAILY     pravastatin 40 MG tablet  Commonly known as:  PRAVACHOL  Take 40 mg by mouth at bedtime.     sucralfate 1 G tablet  Commonly known as:  CARAFATE  Take 1 tablet (1 g total) by mouth 4 (four) times daily.     vitamin C 500 MG tablet  Commonly known as:  ASCORBIC ACID  Take 500 mg by mouth daily.       Allergies  Allergen Reactions  . Other     All pain medications: Unknown/ CODEINE  . Prednisone Other (See Comments)    DYSPHORIA  . Vioxx [Rofecoxib]    . Entex T [Pseudoephedrine-Guaifenesin] Palpitations  . Levaquin [Levofloxacin] Rash  . Penicillins Rash  . Sulfa Antibiotics Rash  . Trovan [Alatrofloxacin] Rash   Past Medical History  Diagnosis Date  . Hypertension   . Hyperlipemia   . Anxiety and depression   . Arthritis   . Colon polyps   . Hemorrhoids   . GERD (gastroesophageal reflux disease)   . Panic disorder   . Kidney stones   . Stomach ulcer   . Unspecified essential hypertension 11/26/2013  . Mixed hyperlipidemia 11/26/2013      Review of Systems  Musculoskeletal: Positive for joint swelling.  All other systems reviewed and are negative.  BP 142/86  Pulse 76  Temp(Src) 98.2 F (36.8 C) (Temporal)  Resp 18  Ht 5\' 5"  (1.651 m)  Wt 155 lb (70.308 kg)  BMI 25.79 kg/m2     Objective:  Physical Exam  Nursing note and vitals reviewed. Constitutional: She is oriented to person, place, and time. She appears well-developed and well-nourished.  Cardiovascular: Normal rate, regular rhythm, normal heart sounds and intact distal pulses.   Pulmonary/Chest: Effort normal and breath sounds normal.  Musculoskeletal: Normal range of motion. She exhibits tenderness.  Right base of great toe with tenderness and erythema.   Neurological: She is alert and oriented to person, place, and time. No cranial nerve deficit. Coordination normal.  Skin: Skin is warm and dry. There is erythema.  See MSK          Assessment & Plan:  ? Toe pain-check labs, if neg xray of foot. Gout instructions given. Elevate foot today

## 2014-03-11 NOTE — Patient Instructions (Signed)
Gout  Gout is when your joints become red, sore, and swell (inflammed). This is caused by the buildup of uric acid crystals in the joints. Uric acid is a chemical that is normally in the blood. If the level of uric acid gets too high in the blood, these crystals form in your joints and tissues. Over time, these crystals can form into masses near the joints and tissues. These masses can destroy bone and cause the bone to look misshapen (deformed).  HOME CARE   · Do not take aspirin for pain.  · Only take medicine as told by your doctor.  · Rest the joint as much as you can. When in bed, keep sheets and blankets off painful areas.  · Keep the sore joints raised (elevated).  · Put warm or cold packs on painful joints. Use of warm or cold packs depends on which works best for you.  · Use crutches if the painful joint is in your leg.  · Drink enough fluids to keep your pee (urine) clear or pale yellow. Limit alcohol, sugary drinks, and drinks with fructose in them.  · Follow your diet instructions. Pay careful attention to how much protein you eat. Include fruits, vegetables, whole grains, and fat-free or low-fat milk products in your daily diet. Talk to your doctor or dietician about the use of coffee, vitamin C, and cherries. These may help lower uric acid levels.  · Keep a healthy body weight.  GET HELP RIGHT AWAY IF:   · You have watery poop (diarrhea), throw up (vomit), or have any side effects from medicines.  · You do not feel better in 24 hours, or you are getting worse.  · Your joint becomes suddenly more tender, and you have chills or a fever.  MAKE SURE YOU:   · Understand these instructions.  · Will watch your condition.  · Will get help right away if you are not doing well or get worse.  Document Released: 05/30/2008 Document Revised: 12/16/2012 Document Reviewed: 11/29/2009  [employer]® Patient Information ©2015 [employer]. This information is not intended to replace advice given to you by your health care  provider. Make sure you discuss any questions you have with your health care provider.

## 2014-03-12 ENCOUNTER — Other Ambulatory Visit: Payer: Self-pay | Admitting: Emergency Medicine

## 2014-03-12 DIAGNOSIS — M25571 Pain in right ankle and joints of right foot: Secondary | ICD-10-CM

## 2014-03-23 ENCOUNTER — Encounter: Payer: Self-pay | Admitting: Emergency Medicine

## 2014-03-23 ENCOUNTER — Other Ambulatory Visit: Payer: Self-pay | Admitting: Internal Medicine

## 2014-03-25 ENCOUNTER — Ambulatory Visit (HOSPITAL_COMMUNITY): Payer: Self-pay | Admitting: Psychiatry

## 2014-03-31 ENCOUNTER — Telehealth: Payer: Self-pay | Admitting: *Deleted

## 2014-03-31 NOTE — Telephone Encounter (Signed)
Patient called with c/o severe back pain with no recall of specific injury. Per Dr. Idell Pickles orders patient was advised to try OTC Tylenol and a heating pad to help relieve symptoms since any pain Rx given could interact with her current Rx's.  Advised f/u ov for further evaluation if no relief from symptoms.

## 2014-04-06 ENCOUNTER — Encounter: Payer: Self-pay | Admitting: Emergency Medicine

## 2014-04-06 ENCOUNTER — Ambulatory Visit (INDEPENDENT_AMBULATORY_CARE_PROVIDER_SITE_OTHER): Payer: 59 | Admitting: Emergency Medicine

## 2014-04-06 VITALS — BP 148/92 | HR 68 | Temp 98.4°F | Resp 18 | Wt 158.2 lb

## 2014-04-06 DIAGNOSIS — S31829A Unspecified open wound of left buttock, initial encounter: Secondary | ICD-10-CM

## 2014-04-06 DIAGNOSIS — E782 Mixed hyperlipidemia: Secondary | ICD-10-CM

## 2014-04-06 DIAGNOSIS — I10 Essential (primary) hypertension: Secondary | ICD-10-CM

## 2014-04-06 DIAGNOSIS — R7309 Other abnormal glucose: Secondary | ICD-10-CM

## 2014-04-06 DIAGNOSIS — Z0000 Encounter for general adult medical examination without abnormal findings: Secondary | ICD-10-CM

## 2014-04-06 DIAGNOSIS — Z23 Encounter for immunization: Secondary | ICD-10-CM

## 2014-04-06 DIAGNOSIS — S39012A Strain of muscle, fascia and tendon of lower back, initial encounter: Secondary | ICD-10-CM

## 2014-04-06 LAB — CBC WITH DIFFERENTIAL/PLATELET
Basophils Absolute: 0 10*3/uL (ref 0.0–0.1)
Basophils Relative: 0 % (ref 0–1)
Eosinophils Absolute: 0.1 10*3/uL (ref 0.0–0.7)
Eosinophils Relative: 2 % (ref 0–5)
HCT: 42 % (ref 36.0–46.0)
Hemoglobin: 14.4 g/dL (ref 12.0–15.0)
Lymphocytes Relative: 32 % (ref 12–46)
Lymphs Abs: 2.1 10*3/uL (ref 0.7–4.0)
MCH: 30 pg (ref 26.0–34.0)
MCHC: 34.3 g/dL (ref 30.0–36.0)
MCV: 87.5 fL (ref 78.0–100.0)
Monocytes Absolute: 0.5 10*3/uL (ref 0.1–1.0)
Monocytes Relative: 8 % (ref 3–12)
Neutro Abs: 3.9 10*3/uL (ref 1.7–7.7)
Neutrophils Relative %: 58 % (ref 43–77)
Platelets: 216 10*3/uL (ref 150–400)
RBC: 4.8 MIL/uL (ref 3.87–5.11)
RDW: 13.6 % (ref 11.5–15.5)
WBC: 6.7 10*3/uL (ref 4.0–10.5)

## 2014-04-06 MED ORDER — BUMETANIDE 2 MG PO TABS: 2.0000 mg | ORAL_TABLET | Freq: Three times a day (TID) | ORAL | Status: DC | PRN

## 2014-04-06 NOTE — Patient Instructions (Signed)

## 2014-04-06 NOTE — Progress Notes (Signed)
Subjective:    Patient ID: Latoya [name], female    DOB: 01-15-1951, 63 y.o.   MRN: [id number]  HPI Comments: 63 yo WF CPE and presents for 3 month F/U for HTN, Cholesterol, Pre-Dm, D. Deficient. She is trying to improve diet and cut out soda. She has not been as active with yard work/ walking due to recent hand/ wrist surgery and hurting back.She did strain back last week in yard but it has improved with rest.   She notes mood has been better with relaxation techniques and exercise. She notes she is aggravated today because of insurance. She has not had F/U with Dr Reece Levy in several weeks and does not intend to f/u as long as she is managing mood at home.   She had mole removed on low back earlier this week with path still pending. She notes area itches but otherwise no complaint.   She notes LE swelling on/ off w/o trigger. She notes she will take fluid pill x 2-3 days and then stop with good results usually lasting several days before she has to repeat medicine.    She notes reflux has been controlled with medicine and diet.   WBC             7.7   02/10/2014 HGB            14.1   02/10/2014 HCT            40.8   02/10/2014 PLT             231   02/10/2014 GLUCOSE          89   02/10/2014 CHOL            166   11/26/2013 TRIG            143   11/26/2013 HDL              48   11/26/2013 LDLCALC          89   11/26/2013 ALT              15   11/26/2013 AST              24   11/26/2013 NA              141   02/10/2014 K               4.1   02/10/2014 CL              103   02/10/2014 CREATININE     1.24   02/10/2014 BUN              14   02/10/2014 CO2              26   02/10/2014 TSH           2.164   02/10/2014 HGBA1C          5.8   11/26/2013   Back Pain      Medication List       This list is accurate as of: 04/06/14 11:59 PM.  Always use your most recent med list.               aspirin 81 MG tablet  Take 81 mg by mouth daily.     Biotin 1000 MCG tablet  Take 1,000 mcg by mouth 3 (three)  times daily.     bumetanide 2 MG  tablet  Commonly known as:  BUMEX  Take 1 tablet (2 mg total) by mouth 3 (three) times daily as needed.     cholecalciferol 1000 UNITS tablet  Commonly known as:  VITAMIN D  Take 5,000 Units by mouth daily.     clonazePAM 2 MG tablet  Commonly known as:  KLONOPIN  TAKE 1/2 TABLET BY MOUTH TWICE DAILY AND 1 TABLET BY MOUTH EVERY NIGHT AT BEDTIME     diltiazem 120 MG tablet  Commonly known as:  CARDIZEM  TAKE 1 TABLET BY MOUTH TWICE DAILY     Magnesium 250 MG Tabs  Take 1 tablet by mouth daily.     pantoprazole 40 MG tablet  Commonly known as:  PROTONIX  Take 30- 60 min before your first and last meals of the day     potassium chloride SA 20 MEQ tablet  Commonly known as:  K-DUR,KLOR-CON  TAKE 1 TABLET BY MOUTH TWICE DAILY     potassium chloride SA 20 MEQ tablet  Commonly known as:  K-DUR,KLOR-CON  TAKE 1 TABLET BY MOUTH TWICE DAILY     pravastatin 40 MG tablet  Commonly known as:  PRAVACHOL  Take 40 mg by mouth at bedtime.     sucralfate 1 G tablet  Commonly known as:  CARAFATE  Take 1 tablet (1 g total) by mouth 4 (four) times daily.     vitamin C 500 MG tablet  Commonly known as:  ASCORBIC ACID  Take 500 mg by mouth daily.       Allergies  Allergen Reactions  . Other     All pain medications: Unknown/ CODEINE  . Prednisone Other (See Comments)    DYSPHORIA  . Vioxx [Rofecoxib]   . Entex T [Pseudoephedrine-Guaifenesin] Palpitations  . Levaquin [Levofloxacin] Rash  . Penicillins Rash  . Sulfa Antibiotics Rash  . Trovan [Alatrofloxacin] Rash   Past Medical History  Diagnosis Date  . Hypertension   . Hyperlipemia   . Anxiety and depression   . Arthritis   . Colon polyps   . Hemorrhoids   . GERD (gastroesophageal reflux disease)   . Panic disorder   . Kidney stones   . Stomach ulcer   . Unspecified essential hypertension 11/26/2013  . Mixed hyperlipidemia 11/26/2013   Past Surgical History  Procedure Laterality  Date  . Abdominal hysterectomy    . Tubal ligation    . Foot surgery  2008    left  . Colonoscopy    . Upper gastrointestinal endoscopy    . Tonsillectomy    . Oophorectomy    . Carpometacarpel suspension plasty Right 12/08/2013    Procedure: CARPOMETACARPEL [facility]) SUSPENSION PLASTY;  Surgeon: Jolyn Nap, MD;  Location: [place];  Service: Orthopedics;  Laterality: Right;   History  Substance Use Topics  . Smoking status: Never Smoker   . Smokeless tobacco: Never Used  . Alcohol Use: No   Family History  Problem Relation Age of Onset  . Heart disease Brother     x2  . Stroke Brother   . Heart disease Father   . Asthma Mother    MAINTENANCE: Colonoscopy:12/12/12 Mammo:09/25/12 BMD:08/26/13 Pap/ Pelvic:2013 wnl EYE: Dentist: CXR:11/14  IMMUNIZATIONS: Td:2007 Pneumovax:1998 Zostavax: Influenza:2014  Patient Care Team: Unk Pinto, MD as PCP - General (Internal Medicine) Aquilla Hacker, MD as Referring Physician (Psychiatry) Jannette Spanner, MD as Referring Physician (Dermatology) [facility] Dentist   Review of Systems  Cardiovascular: Positive for leg swelling.  Musculoskeletal: Positive for back pain.  Skin: Positive for wound.  All other systems reviewed and are negative.  BP 148/92  Pulse 68  Temp(Src) 98.4 F (36.9 C)  Resp 18  Wt 158 lb 3.2 oz (71.759 kg)     Objective:   Physical Exam  Nursing note and vitals reviewed. Constitutional: She is oriented to person, place, and time. She appears well-developed and well-nourished. No distress.  HENT:  Head: Normocephalic and atraumatic.  Right Ear: External ear normal.  Left Ear: External ear normal.  Nose: Nose normal.  Mouth/Throat: Oropharynx is clear and moist.  Eyes: Conjunctivae and EOM are normal. Pupils are equal, round, and reactive to light. Right eye exhibits no discharge. Left eye exhibits no discharge. No scleral icterus.  Neck: Normal range of  motion. Neck supple. No JVD present. No tracheal deviation present. No thyromegaly present.  Cardiovascular: Normal rate, regular rhythm, normal heart sounds and intact distal pulses.   Pulmonary/Chest: Effort normal and breath sounds normal.  Abdominal: Soft. Bowel sounds are normal. She exhibits no distension and no mass. There is no tenderness. There is no rebound and no guarding.  Genitourinary:  Breasts- WNL  Musculoskeletal: Normal range of motion. She exhibits no edema and no tenderness.  Lymphadenopathy:    She has no cervical adenopathy.  Neurological: She is alert and oriented to person, place, and time. She has normal reflexes. No cranial nerve deficit. She exhibits normal muscle tone. Coordination normal.  Skin: Skin is warm and dry. No rash noted. No erythema. No pallor.     Psychiatric: She has a normal mood and affect. Her behavior is normal. Judgment and thought content normal.       Assessment & Plan:  1. CPE- Update screening labs/ History/ Immunizations/ Testing as needed. Advised healthy diet, QD exercise, increase H20 and continue RX/ Vitamins AD.  2.  3 month F/U for HTN, Cholesterol, Pre-Dm, D. Deficient. Needs healthy diet, cardio QD and obtain healthy weight. Check Labs, Check BP if >130/80 call office  3. Skin changes- advised of hygiene and signs of infection to monitor for, w/c with any concerns.   4. Mood disorder- advised close monitoring and f/u Psych if increased changes/ difficulty  5. Back strain- advised slowly restart activity and stretching to help with stiffness, f/u if no improvement- ADVISED AVOID TAKING OLD RX MUSCLE RELAXERS/ PAIN MEDS

## 2014-04-07 ENCOUNTER — Encounter: Payer: Self-pay | Admitting: Physician Assistant

## 2014-04-07 LAB — LIPID PANEL
Cholesterol: 162 mg/dL (ref 0–200)
HDL: 46 mg/dL (ref >39)
LDL Cholesterol: 90 mg/dL (ref 0–99)
Total CHOL/HDL Ratio: 3.5 Ratio
Triglycerides: 129 mg/dL (ref <150)
VLDL: 26 mg/dL (ref 0–40)

## 2014-04-07 LAB — URINALYSIS, ROUTINE W REFLEX MICROSCOPIC
Bilirubin Urine: NEGATIVE
Glucose, UA: NEGATIVE mg/dL
Hgb urine dipstick: NEGATIVE
Ketones, ur: NEGATIVE mg/dL
Leukocytes, UA: NEGATIVE
Nitrite: NEGATIVE
Protein, ur: NEGATIVE mg/dL
Specific Gravity, Urine: 1.007 (ref 1.005–1.030)
Urobilinogen, UA: 0.2 mg/dL (ref 0.0–1.0)
pH: 5.5 (ref 5.0–8.0)

## 2014-04-07 LAB — MICROALBUMIN / CREATININE URINE RATIO
Creatinine, Urine: 6.5 mg/dL
Microalb Creat Ratio: 76.9 mg/g — ABNORMAL HIGH (ref 0.0–30.0)
Microalb, Ur: 0.5 mg/dL (ref 0.00–1.89)

## 2014-04-07 LAB — VITAMIN D 25 HYDROXY (VIT D DEFICIENCY, FRACTURES): Vit D, 25-Hydroxy: 76 ng/mL (ref 30–89)

## 2014-04-07 LAB — BASIC METABOLIC PANEL WITH GFR
BUN: 10 mg/dL (ref 6–23)
CO2: 27 mEq/L (ref 19–32)
Calcium: 9.4 mg/dL (ref 8.4–10.5)
Chloride: 106 mEq/L (ref 96–112)
Creat: 0.95 mg/dL (ref 0.50–1.10)
GFR, Est African American: 74 mL/min
GFR, Est Non African American: 64 mL/min
Glucose, Bld: 78 mg/dL (ref 70–99)
Potassium: 3.7 mEq/L (ref 3.5–5.3)
Sodium: 143 mEq/L (ref 135–145)

## 2014-04-07 LAB — HEPATIC FUNCTION PANEL
ALT: 18 U/L (ref 0–35)
AST: 26 U/L (ref 0–37)
Albumin: 4.6 g/dL (ref 3.5–5.2)
Alkaline Phosphatase: 93 U/L (ref 39–117)
Bilirubin, Direct: 0.1 mg/dL (ref 0.0–0.3)
Indirect Bilirubin: 0.3 mg/dL (ref 0.2–1.2)
Total Bilirubin: 0.4 mg/dL (ref 0.2–1.2)
Total Protein: 6.9 g/dL (ref 6.0–8.3)

## 2014-04-07 LAB — HEMOGLOBIN A1C
Hgb A1c MFr Bld: 5.7 % — ABNORMAL HIGH (ref <5.7)
Mean Plasma Glucose: 117 mg/dL — ABNORMAL HIGH (ref <117)

## 2014-04-07 LAB — INSULIN, FASTING: Insulin fasting, serum: 40 u[IU]/mL — ABNORMAL HIGH (ref 3–28)

## 2014-04-07 LAB — MAGNESIUM: Magnesium: 2 mg/dL (ref 1.5–2.5)

## 2014-04-07 LAB — TSH: TSH: 1.858 u[IU]/mL (ref 0.350–4.500)

## 2014-04-09 ENCOUNTER — Ambulatory Visit (HOSPITAL_COMMUNITY): Payer: Self-pay | Admitting: Psychiatry

## 2014-04-24 ENCOUNTER — Other Ambulatory Visit: Payer: Self-pay | Admitting: Physician Assistant

## 2014-05-01 ENCOUNTER — Ambulatory Visit (INDEPENDENT_AMBULATORY_CARE_PROVIDER_SITE_OTHER): Payer: 59 | Admitting: Internal Medicine

## 2014-05-01 ENCOUNTER — Encounter: Payer: Self-pay | Admitting: Internal Medicine

## 2014-05-01 VITALS — BP 116/70 | HR 60 | Temp 98.2°F | Resp 16 | Ht 65.0 in | Wt 155.2 lb

## 2014-05-01 DIAGNOSIS — F411 Generalized anxiety disorder: Secondary | ICD-10-CM

## 2014-05-01 DIAGNOSIS — R7309 Other abnormal glucose: Secondary | ICD-10-CM

## 2014-05-01 DIAGNOSIS — E559 Vitamin D deficiency, unspecified: Secondary | ICD-10-CM

## 2014-05-01 DIAGNOSIS — Z79899 Other long term (current) drug therapy: Secondary | ICD-10-CM

## 2014-05-01 MED ORDER — CHLORDIAZEPOXIDE-AMITRIPTYLINE 10-25 MG PO TABS: ORAL_TABLET | ORAL | Status: DC

## 2014-05-01 NOTE — Patient Instructions (Signed)
   Stop Clonazepam   And start   Limbitrol 10/25  2 to 3 x daily for nerves

## 2014-05-01 NOTE — Progress Notes (Signed)
Subjective:    Patient ID: Latoya [name], female    DOB: Dec 28, 1950, 63 y.o.   MRN: [id number]  HPI  Patient is a nive 63 yo MWF long time patient of the practice who has chronic problems with anxiety and depression. She has been felt to have bipolar feature and thus tried on mood stabilizing meds. 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 Now she presents w/complaints of escalating anxiety despite her 63 current regimen of Klonopin  2 mg x 1/2 qam. In interchanging with her husband today she seems somewhat agitated and "short fused". She disavows taking any "anti-depressants".  Today she has no delusions, ideations or hallucinations.  She also c/o Lt. ankle swelling today.    Medication List   aspirin 81 MG tablet  Take 81 mg by mouth daily.     Biotin 1000 MCG tablet  Take 1,000 mcg by mouth 3 (three) times daily.      bumetanide 2 MG tablet  Commonly known as:  BUMEX  Take 1 tablet (2 mg total) by mouth 3 (three) times daily as needed.     chlordiazePOXIDE-amitriptyline 10-25 MG Tabs  Commonly known as:  LIMBITROL DS  Take 1 tablet 3 x day for Nerves     cholecalciferol 1000 UNITS tablet  Commonly known as:  VITAMIN D  Take 5,000 Units by mouth daily.     diltiazem 120 MG tablet  Commonly known as:  CARDIZEM  TAKE 1 TABLET BY MOUTH TWICE DAILY     Magnesium 250 MG Tabs  Take 1 tablet by mouth daily.     pantoprazole 40 MG tablet  Commonly known as:  PROTONIX  Take 30- 60 min before your first and last meals of the day     potassium chloride SA 20 MEQ tablet  Commonly known as:  K-DUR,KLOR-CON  TAKE 1 TABLET BY MOUTH TWICE DAILY     pravastatin 40 MG tablet  Commonly known as:  PRAVACHOL  Take 40 mg by mouth at bedtime.     sucralfate 1 G tablet  Commonly known as:  CARAFATE  Take 1 tablet (1 g total) by mouth 4 (four) times daily.     vitamin C 500  MG tablet  Commonly known as:  ASCORBIC ACID  Take 500 mg by mouth daily.     Allergies  Allergen Reactions  . Other     All pain medications: Unknown/ CODEINE  . Prednisone Other (See Comments)    DYSPHORIA  . Vioxx [Rofecoxib]   . Entex T [Pseudoephedrine-Guaifenesin] Palpitations  . Levaquin [Levofloxacin] Rash  . Penicillins Rash  . Sulfa Antibiotics Rash  . Trovan [Alatrofloxacin] Rash   Past Medical History  Diagnosis Date  . Hypertension   . Hyperlipemia   . Anxiety and depression   . Arthritis   . Colon polyps   . Hemorrhoids   . GERD (gastroesophageal reflux disease)   . Panic disorder   . Kidney stones   . Stomach ulcer   . Unspecified essential hypertension 11/26/2013  . Mixed hyperlipidemia 11/26/2013   Review of Systems  Non-contributory  Objective:   Physical Exam  BP 116/70  P 60  T  98.2 F   R 16  Ht 5\' 5"    Wt 155 lb 3.2 oz   BMI 25.83  HEENT - Eac's patent. TM's Nl. EOM's full.  PERRLA. NasoOroPharynx clear. Neck - supple. Nl Thyroid. Carotids 2+ & No bruits, nodes, JVD Chest - Clear equal BS w/o Rales, rhonchi, wheezes. Cor - Nl HS. RRR w/o sig MGR. PP 1(+). No edema. Abd - No palpable organomegaly, masses or tenderness. BS nl. MS- FROM w/o deformities. Muscle power, tone and bulk Nl. Gait Nl. Neuro - No obvious Cr N abnormalities. Sensory, motor and Cerebellar functions appear Nl w/o focal abnormalities. Psyche - Mental status somewhat agitated and argumentative.  No delusions, ideations or obvious mood abnormalities.  Assessment & Plan:   1. Anxiety state, unspecified  - Recc hold Klonepin for now  And try Rx Limbitrol 10/25 DDS #90 x 1 rf take 1 tablet 3 x da - has f/u OV in ~ 1 mo - advised to call sooner if problems

## 2014-05-06 ENCOUNTER — Encounter: Payer: Self-pay | Admitting: Internal Medicine

## 2014-05-07 ENCOUNTER — Telehealth: Payer: Self-pay | Admitting: *Deleted

## 2014-05-07 NOTE — Telephone Encounter (Signed)
PT is calling asking if she could have a scan to check for dementia? Said she spoke with you about this but is asking for peace of mind could she get it done because of her symptoms & ins pay for it?

## 2014-05-07 NOTE — Telephone Encounter (Signed)
Left message for pt that per Dr Melford Aase he dosent think it is necessary for this study,also that he said insurance wouldn't cover it & also that over 5,000$ for something like this. Left details on her machine.

## 2014-05-14 ENCOUNTER — Other Ambulatory Visit: Payer: Self-pay

## 2014-05-25 ENCOUNTER — Telehealth: Payer: Self-pay | Admitting: *Deleted

## 2014-05-25 ENCOUNTER — Emergency Department (HOSPITAL_COMMUNITY): Payer: 59

## 2014-05-25 ENCOUNTER — Emergency Department (HOSPITAL_COMMUNITY)
Admission: EM | Admit: 2014-05-25 | Discharge: 2014-05-25 | Disposition: A | Discharge status: Home / Self Care | Payer: 59 | Attending: Emergency Medicine | Admitting: Emergency Medicine

## 2014-05-25 ENCOUNTER — Encounter (HOSPITAL_COMMUNITY): Payer: Self-pay | Admitting: Emergency Medicine

## 2014-05-25 DIAGNOSIS — F3289 Other specified depressive episodes: Secondary | ICD-10-CM | POA: Insufficient documentation

## 2014-05-25 DIAGNOSIS — M25473 Effusion, unspecified ankle: Secondary | ICD-10-CM | POA: Insufficient documentation

## 2014-05-25 DIAGNOSIS — F329 Major depressive disorder, single episode, unspecified: Secondary | ICD-10-CM | POA: Insufficient documentation

## 2014-05-25 DIAGNOSIS — Z7982 Long term (current) use of aspirin: Secondary | ICD-10-CM | POA: Diagnosis not present

## 2014-05-25 DIAGNOSIS — Z880 Allergy status to penicillin: Secondary | ICD-10-CM | POA: Diagnosis not present

## 2014-05-25 DIAGNOSIS — Z79899 Other long term (current) drug therapy: Secondary | ICD-10-CM | POA: Diagnosis not present

## 2014-05-25 DIAGNOSIS — I10 Essential (primary) hypertension: Secondary | ICD-10-CM | POA: Diagnosis not present

## 2014-05-25 DIAGNOSIS — F17200 Nicotine dependence, unspecified, uncomplicated: Secondary | ICD-10-CM | POA: Insufficient documentation

## 2014-05-25 DIAGNOSIS — R0602 Shortness of breath: Secondary | ICD-10-CM | POA: Insufficient documentation

## 2014-05-25 DIAGNOSIS — M129 Arthropathy, unspecified: Secondary | ICD-10-CM | POA: Insufficient documentation

## 2014-05-25 DIAGNOSIS — E785 Hyperlipidemia, unspecified: Secondary | ICD-10-CM | POA: Diagnosis not present

## 2014-05-25 DIAGNOSIS — J069 Acute upper respiratory infection, unspecified: Secondary | ICD-10-CM

## 2014-05-25 DIAGNOSIS — Z860100 Personal history of colon polyps, unspecified: Secondary | ICD-10-CM | POA: Insufficient documentation

## 2014-05-25 DIAGNOSIS — F410 Panic disorder [episodic paroxysmal anxiety] without agoraphobia: Secondary | ICD-10-CM | POA: Diagnosis not present

## 2014-05-25 DIAGNOSIS — E782 Mixed hyperlipidemia: Secondary | ICD-10-CM | POA: Insufficient documentation

## 2014-05-25 DIAGNOSIS — K219 Gastro-esophageal reflux disease without esophagitis: Secondary | ICD-10-CM | POA: Diagnosis not present

## 2014-05-25 DIAGNOSIS — M25476 Effusion, unspecified foot: Secondary | ICD-10-CM | POA: Insufficient documentation

## 2014-05-25 LAB — BASIC METABOLIC PANEL
Anion gap: 16 — ABNORMAL HIGH (ref 5–15)
BUN: 18 mg/dL (ref 6–23)
CO2: 23 mEq/L (ref 19–32)
Calcium: 9.7 mg/dL (ref 8.4–10.5)
Chloride: 104 mEq/L (ref 96–112)
Creatinine, Ser: 1.11 mg/dL — ABNORMAL HIGH (ref 0.50–1.10)
GFR calc Af Amer: 59 mL/min — ABNORMAL LOW (ref >90)
GFR calc non Af Amer: 51 mL/min — ABNORMAL LOW (ref >90)
Glucose, Bld: 101 mg/dL — ABNORMAL HIGH (ref 70–99)
Potassium: 3.8 mEq/L (ref 3.7–5.3)
Sodium: 143 mEq/L (ref 137–147)

## 2014-05-25 LAB — CBC
HCT: 46.5 % — ABNORMAL HIGH (ref 36.0–46.0)
Hemoglobin: 15.5 g/dL — ABNORMAL HIGH (ref 12.0–15.0)
MCH: 30.2 pg (ref 26.0–34.0)
MCHC: 33.3 g/dL (ref 30.0–36.0)
MCV: 90.5 fL (ref 78.0–100.0)
Platelets: 227 10*3/uL (ref 150–400)
RBC: 5.14 MIL/uL — ABNORMAL HIGH (ref 3.87–5.11)
RDW: 12.5 % (ref 11.5–15.5)
WBC: 6.7 10*3/uL (ref 4.0–10.5)

## 2014-05-25 LAB — I-STAT TROPONIN, ED: Troponin i, poc: 0 ng/mL (ref 0.00–0.08)

## 2014-05-25 LAB — PRO B NATRIURETIC PEPTIDE: Pro B Natriuretic peptide (BNP): 24 pg/mL (ref 0–125)

## 2014-05-25 NOTE — ED Notes (Signed)
Pt states she is on fluids pills started yesterday, pt having swelling in bilateral ankles, pt c/o SOB and feeling fatigue x 1 week states it is getting worse.

## 2014-05-25 NOTE — Discharge Instructions (Signed)
Cough, Adult   A cough is a reflex. It helps you clear your throat and airways. A cough can help heal your body. A cough can last 2 or 3 weeks (acute) or may last more than 8 weeks (chronic). Some common causes of a cough can include an infection, allergy, or a cold.  HOME CARE  · Only take medicine as told by your doctor.  · If given, take your medicines (antibiotics) as told. Finish them even if you start to feel better.  · Use a cold steam vaporizer or humidifier in your home. This can help loosen thick spit (secretions).  · Sleep so you are almost sitting up (semi-upright). Use pillows to do this. This helps reduce coughing.  · Rest as needed.  · Stop smoking if you smoke.  GET HELP RIGHT AWAY IF:  · You have yellowish-white fluid (pus) in your thick spit.  · Your cough gets worse.  · Your medicine does not reduce coughing, and you are losing sleep.  · You cough up blood.  · You have trouble breathing.  · Your pain gets worse and medicine does not help.  · You have a fever.  MAKE SURE YOU:   · Understand these instructions.  · Will watch your condition.  · Will get help right away if you are not doing well or get worse.  Document Released: 05/04/2011 Document Revised: 01/05/2014 Document Reviewed: 05/04/2011  Exit[employer]® Patient Information ©2015 [employer]. This information is not intended to replace advice given to you by your health care provider. Make sure you discuss any questions you have with your health care provider.

## 2014-05-25 NOTE — ED Provider Notes (Signed)
CSN: [id number]     Arrival date & time 05/25/14  1234 History   First MD Initiated Contact with Patient 05/25/14 1916     Chief Complaint  Patient presents with  . Shortness of Breath  . ankle swellign     bilateral  . Fatigue     (Consider location/radiation/quality/duration/timing/severity/associated sxs/prior Treatment) HPI  63 year old female complaining of hoarseness. Patient with URI type symptoms past week or so. She lost her voice in the past 2 days. She denies any sore throat or neck pain. No chest pain. Occasional nonproductive cough. Mild dyspnea. There is generally fatigued. No fevers or chills. No nausea or vomiting. Some mild swelling to her feet and ankles. This is currently improved. No calf pain. No rash. Has a past history DVT/pulmonary embolism. Has been taking over-the-counter cold preparations without any significant relief.  Past Medical History  Diagnosis Date  . Hypertension   . Hyperlipemia   . Anxiety and depression   . Arthritis   . Colon polyps   . Hemorrhoids   . GERD (gastroesophageal reflux disease)   . Panic disorder   . Kidney stones   . Stomach ulcer   . Unspecified essential hypertension 11/26/2013  . Mixed hyperlipidemia 11/26/2013   Past Surgical History  Procedure Laterality Date  . Abdominal hysterectomy    . Tubal ligation    . Foot surgery  2008    left  . Colonoscopy    . Upper gastrointestinal endoscopy    . Tonsillectomy    . Oophorectomy    . Carpometacarpel suspension plasty Right 12/08/2013    Procedure: CARPOMETACARPEL [facility]) SUSPENSION PLASTY;  Surgeon: Jolyn Nap, MD;  Location: [place];  Service: Orthopedics;  Laterality: Right;   Family History  Problem Relation Age of Onset  . Heart disease Brother     x2  . Stroke Brother   . Heart disease Father   . Asthma Mother    History  Substance Use Topics  . Smoking status: Never Smoker   . Smokeless tobacco: Never Used  . Alcohol Use: No   OB  History   Grav Para Term Preterm Abortions TAB SAB Ect Mult Living                 Review of Systems  All systems reviewed and negative, other than as noted in HPI.    Allergies  Other; Prednisone; Vioxx; Entex t; Levaquin; Penicillins; Sulfa antibiotics; and Trovan  Home Medications   Prior to Admission medications   Medication Sig Start Date End Date Taking? Authorizing Provider  aspirin 81 MG tablet Take 81 mg by mouth daily.   Yes Historical Provider, MD  Biotin 1000 MCG tablet Take 1,000 mcg by mouth 3 (three) times daily.   Yes Historical Provider, MD  bumetanide (BUMEX) 2 MG tablet Take 2 mg by mouth daily as needed. Pt takes for swelling   Yes Historical Provider, MD  chlordiazePOXIDE-amitriptyline (LIMBITROL DS) 10-25 MG TABS Take 1 tablet by mouth 3 (three) times daily.   Yes Historical Provider, MD  cholecalciferol (VITAMIN D) 1000 UNITS tablet Take 5,000 Units by mouth daily.    Yes Historical Provider, MD  diltiazem (CARDIZEM) 120 MG tablet Take 120 mg by mouth daily.   Yes Historical Provider, MD  Magnesium 250 MG TABS Take 250 mg by mouth daily.    Yes Historical Provider, MD  pantoprazole (PROTONIX) 40 MG tablet Take 40 mg by mouth daily.   Yes Historical Provider, MD  potassium chloride SA (K-DUR,KLOR-CON) 20 MEQ tablet Take 20 mEq by mouth 2 (two) times daily.   Yes Historical Provider, MD  pravastatin (PRAVACHOL) 40 MG tablet Take 40 mg by mouth at bedtime.   Yes Historical Provider, MD  sucralfate (CARAFATE) 1 G tablet Take 1 g by mouth 4 (four) times daily as needed. Heart burn per pt.   Yes Historical Provider, MD  vitamin C (ASCORBIC ACID) 500 MG tablet Take 500 mg by mouth daily.   Yes Historical Provider, MD   BP 167/98  Pulse 109  Temp(Src) 98.2 F (36.8 C) (Oral)  Resp 20  SpO2 99% Physical Exam  Nursing note and vitals reviewed. Constitutional: She appears well-developed and well-nourished. No distress.  HENT:  Head: Normocephalic and atraumatic.   Eyes: Conjunctivae are normal. Right eye exhibits no discharge. Left eye exhibits no discharge.  Neck: Neck supple.  Cardiovascular: Normal rate, regular rhythm and normal heart sounds.  Exam reveals no gallop and no friction rub.   No murmur heard. Pulmonary/Chest: Effort normal and breath sounds normal. No respiratory distress.  Abdominal: Soft. She exhibits no distension. There is no tenderness.  Musculoskeletal: She exhibits no edema and no tenderness.  Lower extremities symmetric as compared to each other. No calf tenderness. Negative Homan's. No palpable cords.   Neurological: She is alert.  Skin: Skin is warm and dry.  Psychiatric: She has a normal mood and affect. Her behavior is normal. Thought content normal.    ED Course  Procedures (including critical care time) Labs Review Labs Reviewed  CBC - Abnormal; Notable for the following:    RBC 5.14 (*)    Hemoglobin 15.5 (*)    HCT 46.5 (*)    All other components within normal limits  BASIC METABOLIC PANEL - Abnormal; Notable for the following:    Glucose, Bld 101 (*)    Creatinine, Ser 1.11 (*)    GFR calc non Af Amer 51 (*)    GFR calc Af Amer 59 (*)    Anion gap 16 (*)    All other components within normal limits  PRO B NATRIURETIC PEPTIDE  I-STAT TROPOININ, ED    Imaging Review Dg Chest 2 View  05/25/2014   CLINICAL DATA:  Shortness of breath, weakness  EXAM: CHEST  2 VIEW  COMPARISON:  None.  FINDINGS: There is no focal parenchymal opacity, pleural effusion, or pneumothorax. The heart and mediastinal contours are unremarkable.  The osseous structures are unremarkable.  IMPRESSION: No active cardiopulmonary disease.   Electronically Signed   By: Kathreen Devoid   On: 05/25/2014 14:00     EKG Interpretation   Date/Time:  Monday May 25 2014 12:56:22 EDT Ventricular Rate:  96 PR Interval:  140 QRS Duration: 92 QT Interval:  340 QTC Calculation: 430 R Axis:   -2 Text Interpretation:  Sinus tachycardia  Ventricular premature complex  Borderline repolarization abnormality ED PHYSICIAN INTERPRETATION  AVAILABLE IN [facility] Confirmed by TEST, Record ([id number]) on  05/27/2014 7:05:38 AM      MDM   Final diagnoses:  URI, acute    63 year old female with likely viral URI. Chest x-ray is clear. No increased work of breathing. Plan symptomatic treatment. Suspicion for serious infection or other potential emergent pathology. Return precautions were discussed.    Virgel Manifold, MD 05/29/14 1550

## 2014-05-25 NOTE — ED Notes (Signed)
Patient says her throat is horse.

## 2014-05-25 NOTE — Telephone Encounter (Signed)
Patient called and spoke with Dr Melford Aase over the weekend about difficulty breathing and was advised to go to the ER.  Patient called again today and requested a chest x-ray.  Per Dr Melford Aase, patient needs to go to the ER due to needing chest x-ray and labs to rule out CHF.

## 2014-05-26 ENCOUNTER — Telehealth: Payer: Self-pay | Admitting: *Deleted

## 2014-05-26 MED ORDER — PROMETHAZINE-CODEINE 6.25-10 MG/5ML PO SYRP: ORAL_SOLUTION | ORAL | Status: DC

## 2014-05-26 MED ORDER — PREDNISONE 10 MG PO TABS: ORAL_TABLET | ORAL | Status: DC

## 2014-05-26 NOTE — Telephone Encounter (Signed)
Patient went to ER regarding difficulty breathing and all tests normal.  ER doctor offered her an RX of prednisone which she refused due to the fact it causes her to feel shaky.  Patient also requesting refill on cough medicine.  Per Dr Melford Aase, [place] to send RX for Prednisone 10 mg and OK to call RX for Phenergan with Codiene.

## 2014-06-05 ENCOUNTER — Ambulatory Visit: Payer: Self-pay | Admitting: Internal Medicine

## 2014-06-07 ENCOUNTER — Other Ambulatory Visit: Payer: Self-pay | Admitting: Internal Medicine

## 2014-06-08 ENCOUNTER — Encounter: Payer: Self-pay | Admitting: Internal Medicine

## 2014-06-15 ENCOUNTER — Ambulatory Visit: Payer: Self-pay | Admitting: Internal Medicine

## 2014-06-19 ENCOUNTER — Ambulatory Visit (INDEPENDENT_AMBULATORY_CARE_PROVIDER_SITE_OTHER): Payer: 59 | Admitting: Internal Medicine

## 2014-06-19 ENCOUNTER — Encounter: Payer: Self-pay | Admitting: Internal Medicine

## 2014-06-19 VITALS — BP 112/84 | HR 101 | Temp 97.9°F | Ht 64.0 in | Wt 164.6 lb

## 2014-06-19 DIAGNOSIS — R059 Cough, unspecified: Secondary | ICD-10-CM

## 2014-06-19 DIAGNOSIS — R05 Cough: Secondary | ICD-10-CM

## 2014-06-19 MED ORDER — TRAMADOL HCL 50 MG PO TABS
50.0000 mg | ORAL_TABLET | Freq: Four times a day (QID) | ORAL | Status: DC | PRN
Start: 2014-06-19 — End: 2015-08-23

## 2014-06-19 NOTE — Patient Instructions (Addendum)
Take delsym two tsp every 12 hours and supplement if needed with  tramadol 50 mg up to 2 every 4 hours to suppress the urge to cough. Swallowing water or using ice chips/non mint and menthol containing candies (such as lifesavers or sugarless jolly ranchers) are also effective.  You should rest your voice and avoid activities that you know make you cough.  Once you have eliminated the cough for 3 straight days try reducing the tramadol first,  then the delsym as tolerated.    Pantoprazole / protonix increase  Take 30- 60 min before your first and last meals of the day   For drainage take chlortrimeton (chlorpheniramine) 4 mg every 4 hours available over the counter (may cause drowsiness)   GERD (REFLUX)  is an extremely common cause of respiratory symptoms, many times with no significant heartburn at all.    It can be treated with medication, but also with lifestyle changes including avoidance of late meals, excessive alcohol, smoking cessation, and avoid fatty foods, chocolate, peppermint, colas, red wine, and acidic juices such as orange juice.  NO MINT OR MENTHOL PRODUCTS SO NO COUGH DROPS  USE SUGARLESS CANDY INSTEAD (jolley ranchers or Stover's)  NO OIL BASED VITAMINS - use powdered substitutes.  If not much better in week,  See Tammy NP w/in 2 weeks with all your medications, even over the counter meds, separated in two separate bags, the ones you take no matter what vs the ones you stop once you feel better and take only as needed when you feel you need them.   Tammy  will generate for you a new user friendly medication calendar that will put [place] all on the same page re: your medication use.     Without this process, it simply isn't possible to assure that we are providing  your outpatient care  with  the attention to detail we feel you deserve.   If we cannot assure that you're getting that kind of care,  then we cannot manage your problem effectively from this clinic.  Once you have seen  Tammy and we are sure that we're all on the same page with your medication use she will arrange follow up with me.

## 2014-06-19 NOTE — Progress Notes (Signed)
Subjective:    Patient ID: Latoya [name], female    DOB: Oct 06, 1950   MRN: [id number]    Brief patient profile:  28 yowf never smoker with tendency of recurrent cough x 2010 referred 09/18/2013 to pulmonary clinic Dr Melford Aase   History of Present Illness  09/18/2013 1st [facility] office visit/ Latoya [name] cc acute cough in Nov 2014 got better on mucinex and then worse since stopped it.  Problem is worse in evenings after supper and not flaring early in am, dry and croopy, maintained on ppi and carafate and using lots mint and menthol products to control it. Not better p albuterol rec  When coughing for any reason: delsym 2 tsp every 12 hours  protonix (pantaprazole) 40 mg Take 30- 60 min before your first and last meals of the day then back to one daily when no cough and no need for delsym GERD diet    06/19/2014 acute  ov/Latoya [name] re: recurrent cough fine x 9 months on just ppi qam then relapsed  Chief Complaint  Patient presents with  . Follow-up    productive cough yellowish for 3 weeks  aburpt onset p "caught cold" > persistent day > noct coughl, inhaler didn't help, did not use delsym or double ppi as rec at prev ov  aldready rx zpak , cough med, refused pred asthmanex not working  Worse when speak, using cinamon gum  Unless coughing, not really sob and sleeping better   No obvious day to day or daytime variabilty or assoc sob  or cp or chest tightness, subjective wheeze overt sinus or hb symptoms. No unusual exp hx or h/o childhood pna/ asthma or knowledge of premature birth.  Sleeping ok without nocturnal  or early am exacerbation  of respiratory  c/o's or need for noct saba. Also denies any obvious fluctuation of symptoms with weather or environmental changes or other aggravating or alleviating factors except as outlined above   Current Medications, Allergies, Complete Past Medical History, Past Surgical History, Family History, and Social History were reviewed in [employer] record.  ROS  The following are not active complaints unless bolded sore throat, dysphagia, dental problems, itching, sneezing,  nasal congestion or excess/ purulent secretions, ear ache,   fever, chills, sweats, unintended wt loss, pleuritic or exertional cp, hemoptysis,  orthopnea pnd or leg swelling, presyncope, palpitations, heartburn, abdominal pain, anorexia, nausea, vomiting, diarrhea  or change in bowel or urinary habits, change in stools or urine, dysuria,hematuria,  rash, arthralgias, visual complaints, headache, numbness weakness or ataxia or problems with walking or coordination,  change in mood/affect or memory.                        Objective:   Physical Exam   06/19/2014      165  Wt Readings from Last 3 Encounters:  09/18/13 159 lb (72.122 kg)  08/05/13 157 lb 12.8 oz (71.578 kg)  07/10/13 161 lb (73.029 kg)      amb animated hoarse wf nad with occ throat clearing  HEENT: nl dentition, turbinates, and orophanx except for min cobblestoning. Nl external ear canals without cough reflex   NECK :  without JVD/Nodes/TM/ nl carotid upstrokes bilaterally   LUNGS: no acc muscle use, clear to A and P bilaterally without cough on insp or exp maneuvers   CV:  RRR  no s3 or murmur or increase in P2, no edema   ABD:  soft and nontender  with nl excursion in the supine position. No bruits or organomegaly, bowel sounds nl  MS:  warm without deformities, calf tenderness, cyanosis or clubbing  SKIN: warm and dry without lesions    NEURO:  alert, approp, no deficits      cxr 05/25/14 No active cardiopulmonary disease      Assessment & Plan:

## 2014-06-19 NOTE — Assessment & Plan Note (Addendum)
Explained natural history of uri and why it's necessary in patients at risk to treat GERD aggressively  at least  short term   to reduce risk of evolving cyclical cough initially  triggered by epithelial injury and a heightened sensitivty to the effects of any upper airway irritants,  most importantly acid - related.  That is, the more sensitive the epithelium becomes once it is damaged by the virus, the more the irritability inducing more cough, the more the secondary reflux (especially in those prone to reflux) the more the irritation of the sensitive mucosa and so on in a cyclical pattern.   See instructions for specific recommendations which were reviewed directly with the patient who was given a copy with highlighter outlining the key components.   Note she is really struggling with how to take her maint vs "contingency" or prns.  To keep things simple, I have asked the patient to first separate medicines that are perceived as maintenance, that is to be taken daily "no matter what", from those medicines that are taken on only on an as-needed basis and I have given the patient examples of both, and then return to see our NP to generate a  detailed  medication calendar which should be followed until the next physician sees the patient and updates it.

## 2014-06-24 ENCOUNTER — Other Ambulatory Visit: Payer: Self-pay | Admitting: Internal Medicine

## 2014-06-27 ENCOUNTER — Other Ambulatory Visit: Payer: Self-pay | Admitting: Internal Medicine

## 2014-07-03 ENCOUNTER — Encounter: Payer: Self-pay | Admitting: Adult Health

## 2014-07-03 ENCOUNTER — Ambulatory Visit (INDEPENDENT_AMBULATORY_CARE_PROVIDER_SITE_OTHER): Payer: 59 | Admitting: Adult Health

## 2014-07-03 VITALS — BP 124/84 | HR 83 | Temp 97.0°F | Ht 64.0 in | Wt 165.8 lb

## 2014-07-03 DIAGNOSIS — R059 Cough, unspecified: Secondary | ICD-10-CM

## 2014-07-03 DIAGNOSIS — R05 Cough: Secondary | ICD-10-CM

## 2014-07-03 NOTE — Progress Notes (Signed)
Subjective:    Patient ID: Latoya [name], female    DOB: [birth year]   MRN: [id number]    Brief patient profile:  62 yowf never smoker with tendency of recurrent cough x 2010 referred 09/18/2013 to pulmonary clinic Dr Melford Aase   History of Present Illness  09/18/2013 1st [facility] office visit/ Wert cc acute cough in Nov 2014 got better on mucinex and then worse since stopped it.  Problem is worse in evenings after supper and not flaring early in am, dry and croopy, maintained on ppi and carafate and using lots mint and menthol products to control it. Not better p albuterol rec  When coughing for any reason: delsym 2 tsp every 12 hours  protonix (pantaprazole) 40 mg Take 30- 60 min before your first and last meals of the day then back to one daily when no cough and no need for delsym GERD diet    06/19/2014 acute  ov/Wert re: recurrent cough fine x 9 months on just ppi qam then relapsed  Chief Complaint  Patient presents with  . Follow-up    productive cough yellowish for 3 weeks  aburpt onset p "caught cold" > persistent day > noct coughl, inhaler didn't help, did not use delsym or double ppi as rec at prev ov  aldready rx zpak , cough med, refused pred asthmanex not working  Worse when speak, using cinamon gum  Unless coughing, not really sob and sleeping better  >>delsym and tramadol rx   07/03/2014 Follow up  Returns for follow up and med review  Reports cough is slightly improved with less mucus production.   Scheduled to see ENT next week She denies any chest pain, orthopnea, PND or leg swelling We reviewed all her medications organize them into a medication count with patient education.     Current Medications, Allergies, Complete Past Medical History, Past Surgical History, Family History, and Social History were reviewed in [employer] record.  ROS  The following are not active complaints unless bolded sore throat, dysphagia, dental  problems, itching, sneezing,  nasal congestion or excess/ purulent secretions, ear ache,   fever, chills, sweats, unintended wt loss, pleuritic or exertional cp, hemoptysis,  orthopnea pnd or leg swelling, presyncope, palpitations, heartburn, abdominal pain, anorexia, nausea, vomiting, diarrhea  or change in bowel or urinary habits, change in stools or urine, dysuria,hematuria,  rash, arthralgias, visual complaints, headache, numbness weakness or ataxia or problems with walking or coordination,  change in mood/affect or memory.                        Objective:   Physical Exam   06/19/2014      165 >165 07/03/2014   amb animated hoarse wf nad with occ throat clearing  HEENT: nl dentition, turbinates, and orophanx except for min cobblestoning. Nl external ear canals without cough reflex   NECK :  without JVD/Nodes/TM/ nl carotid upstrokes bilaterally   LUNGS: no acc muscle use, clear to A and P bilaterally without cough on insp or exp maneuvers   CV:  RRR  no s3 or murmur or increase in P2, no edema   ABD:  soft and nontender with nl excursion in the supine position. No bruits or organomegaly, bowel sounds nl  MS:  warm without deformities, calf tenderness, cyanosis or clubbing  SKIN: warm and dry without lesions    NEURO:  alert, approp, no deficits      cxr 05/25/14  No active cardiopulmonary disease      Assessment & Plan:

## 2014-07-03 NOTE — Assessment & Plan Note (Addendum)
Upper airway cough , cont with trigger and cough control regimen  Needs PFT  Patient's medications were reviewed today and patient education was given. Computerized medication calendar was adjusted/completed   Plan  Take delsym two tsp every 12 hours and supplement if needed with  tramadol 50 mg up to 2 every 4 hours to suppress the urge to cough. Swallowing water or using ice chips/non mint and menthol containing candies (such as lifesavers or sugarless jolly ranchers) are also effective.  You should rest your voice and avoid activities that you know make you cough.  Once you have eliminated the cough for 3 straight days try reducing the tramadol first,  then the delsym as tolerated.    Pantoprazole / protonix increase  Take 30- 60 min before your first and last meals of the day   For drainage take chlortrimeton (chlorpheniramine) 4 mg 2 at bedtime as needed.  (may cause drowsiness)     It can be treated with medication, but also with lifestyle changes including avoidance of late meals, excessive alcohol, smoking cessation, and avoid fatty foods, chocolate, peppermint, colas, red wine, and acidic juices such as orange juice.  NO MINT OR MENTHOL PRODUCTS SO NO COUGH DROPS  USE SUGARLESS CANDY INSTEAD (jolley ranchers or Stover's)  NO OIL BASED VITAMINS - use powdered substitutes. NO GUM   Follow up in 6 weeks with PFT   Please contact office for sooner follow up if symptoms do not improve or worsen or seek emergency care

## 2014-07-03 NOTE — Patient Instructions (Signed)
Take delsym two tsp every 12 hours and supplement if needed with  tramadol 50 mg up to 2 every 4 hours to suppress the urge to cough. Swallowing water or using ice chips/non mint and menthol containing candies (such as lifesavers or sugarless jolly ranchers) are also effective.  You should rest your voice and avoid activities that you know make you cough.  Once you have eliminated the cough for 3 straight days try reducing the tramadol first,  then the delsym as tolerated.    Pantoprazole / protonix increase  Take 30- 60 min before your first and last meals of the day   For drainage take chlortrimeton (chlorpheniramine) 4 mg 2 at bedtime as needed.  (may cause drowsiness)     It can be treated with medication, but also with lifestyle changes including avoidance of late meals, excessive alcohol, smoking cessation, and avoid fatty foods, chocolate, peppermint, colas, red wine, and acidic juices such as orange juice.  NO MINT OR MENTHOL PRODUCTS SO NO COUGH DROPS  USE SUGARLESS CANDY INSTEAD (jolley ranchers or Stover's)  NO OIL BASED VITAMINS - use powdered substitutes. NO GUM   Follow up in 6 weeks with PFT   Please contact office for sooner follow up if symptoms do not improve or worsen or seek emergency care

## 2014-07-07 ENCOUNTER — Telehealth: Payer: Self-pay | Admitting: Internal Medicine

## 2014-07-07 NOTE — Telephone Encounter (Signed)
Received 6 pages from [facility], sent to Dr. Melvyn Novas. 07/07/14/ss

## 2014-07-10 ENCOUNTER — Telehealth: Payer: Self-pay | Admitting: Internal Medicine

## 2014-07-10 NOTE — Telephone Encounter (Signed)
Spoke with pt-reports protonix is too expensive for her for upcoming insurance plan year 2016. She wants alternative Please advise thanks  Allergies  Allergen Reactions  . Other     All pain medications: Unknown/ CODEINE  . Prednisone Other (See Comments)    DYSPHORIA  . Vioxx [Rofecoxib]   . Entex T [Pseudoephedrine-Guaifenesin] Palpitations  . Levaquin [Levofloxacin] Rash  . Penicillins Rash  . Sulfa Antibiotics Rash  . Trovan [Alatrofloxacin] Rash

## 2014-07-10 NOTE — Telephone Encounter (Signed)
Try omeprazole 40 same rx

## 2014-07-10 NOTE — Telephone Encounter (Signed)
I called made pt aware of recs of below. Nothing further needed

## 2014-07-13 ENCOUNTER — Telehealth: Payer: Self-pay | Admitting: Internal Medicine

## 2014-07-13 NOTE — Telephone Encounter (Signed)
Patient is scheduled for follow up on 09/14/14. She is added to the cancellation list.  She will call back for worsening symptoms

## 2014-07-24 NOTE — Addendum Note (Signed)
Addended by: Parke Poisson E on: 07/24/2014 10:11 AM   Modules accepted: Orders, Medications

## 2014-08-13 ENCOUNTER — Ambulatory Visit: Payer: Self-pay | Admitting: Adult Health

## 2014-08-22 ENCOUNTER — Other Ambulatory Visit: Payer: Self-pay | Admitting: Internal Medicine

## 2014-08-24 ENCOUNTER — Other Ambulatory Visit: Payer: Self-pay | Admitting: Internal Medicine

## 2014-08-24 DIAGNOSIS — R0600 Dyspnea, unspecified: Secondary | ICD-10-CM

## 2014-08-25 ENCOUNTER — Encounter: Payer: Self-pay | Admitting: Internal Medicine

## 2014-08-25 ENCOUNTER — Ambulatory Visit (INDEPENDENT_AMBULATORY_CARE_PROVIDER_SITE_OTHER): Payer: 59 | Admitting: Internal Medicine

## 2014-08-25 VITALS — BP 158/92 | HR 93 | Temp 98.0°F | Ht 64.0 in | Wt 165.0 lb

## 2014-08-25 DIAGNOSIS — R05 Cough: Secondary | ICD-10-CM

## 2014-08-25 DIAGNOSIS — R0600 Dyspnea, unspecified: Secondary | ICD-10-CM

## 2014-08-25 DIAGNOSIS — R059 Cough, unspecified: Secondary | ICD-10-CM

## 2014-08-25 LAB — PULMONARY FUNCTION TEST
FEF 25-75 Post: 2.53 L/sec
FEF 25-75 Pre: 1.73 L/sec
FEF2575-%Change-Post: 46 %
FEF2575-%Pred-Post: 116 %
FEF2575-%Pred-Pre: 80 %
FEV1-%Change-Post: 6 %
FEV1-%Pred-Post: 86 %
FEV1-%Pred-Pre: 81 %
FEV1-Post: 2.1 L
FEV1-Pre: 1.98 L
FEV1FVC-%Change-Post: 6 %
FEV1FVC-%Pred-Pre: 101 %
FEV6-%Change-Post: 0 %
FEV6-%Pred-Post: 82 %
FEV6-%Pred-Pre: 82 %
FEV6-Post: 2.51 L
FEV6-Pre: 2.52 L
FEV6FVC-%Change-Post: 0 %
FEV6FVC-%Pred-Post: 104 %
FEV6FVC-%Pred-Pre: 103 %
FVC-%Change-Post: 0 %
FVC-%Pred-Post: 79 %
FVC-%Pred-Pre: 79 %
FVC-Post: 2.51 L
FVC-Pre: 2.53 L
Post FEV1/FVC ratio: 84 %
Post FEV6/FVC ratio: 100 %
Pre FEV1/FVC ratio: 78 %
Pre FEV6/FVC Ratio: 100 %
RV % pred: 66 %
RV: 1.39 L
TLC % pred: 84 %
TLC: 4.24 L

## 2014-08-25 MED ORDER — TRAMADOL HCL 50 MG PO TABS: ORAL_TABLET | ORAL | Status: DC

## 2014-08-25 MED ORDER — METHYLPREDNISOLONE ACETATE 80 MG/ML IJ SUSP
120.0000 mg | Freq: Once | INTRAMUSCULAR | Status: AC
Start: 2014-08-25 — End: 2014-08-25
  Administered 2014-08-25: 120 mg via INTRAMUSCULAR

## 2014-08-25 NOTE — Patient Instructions (Addendum)
Take delsym two tsp every 12 hours and supplement if needed with  tramadol 50 mg up to 2 every 4 hours to suppress the urge to cough. Swallowing water or using ice chips/non mint and menthol containing candies (such as lifesavers or sugarless jolly ranchers) are also effective.  You should rest your voice and avoid activities that you know make you cough.  Once you have eliminated the cough for 3 straight days try reducing the tramadol first,  then the delsym as tolerated.    Protonix is 40 mg Take 30- 60 min before your first and last meals of the day   Depomedrol 120 mg IM today   See calendar for specific medication instructions and bring it back for each and every office visit for every healthcare provider you see.  Without it,  you may not receive the best quality medical care that we feel you deserve.  You will note that the calendar groups together  your maintenance  medications that are timed at particular times of the day.  Think of this as your checklist for what your doctor has instructed you to do until your next evaluation to see what benefit  there is  to staying on a consistent group of medications intended to keep you well.  The other group at the bottom is entirely up to you to use as you see fit  for specific symptoms that may arise between visits that require you to treat them on an as needed basis.  Think of this as your action plan or "what if" list.   Separating the top medications from the bottom group is fundamental to providing you adequate care going forward.    If not satisfied next step is to return to see Tammy with pillboxes and all your medications

## 2014-08-25 NOTE — Assessment & Plan Note (Addendum)
Med calendar 07/03/2014  - pfts 08/25/2014 wnl except erv 17%   Cough has proven refractory with no evidence of airways involvement typical of  Classic Upper airway cough syndrome, so named because it's frequently impossible to sort out how much is  CR/sinusitis with freq throat clearing (which can be related to primary GERD)   vs  causing  secondary (" extra esophageal")  GERD from wide swings in gastric pressure that occur with throat clearing, often  promoting self use of mint and menthol lozenges that reduce the lower esophageal sphincter tone and exacerbate the problem further in a cyclical fashion.   These are the same pts (now being labeled as having "irritable larynx syndrome" by some cough centers) who not infrequently have a history of having failed to tolerate ace inhibitors,  dry powder inhalers or biphosphonates or report having atypical reflux symptoms that don't respond to standard doses of PPI , and are easily confused as having aecopd or asthma flares by even experienced allergists/ pulmonologists.   I had an extended discussion with the patient reviewing all relevant studies completed to date and  lasting 15 to 20 minutes of a 25 minute visit on the following ongoing concerns:   The standardized cough guidelines published in Chest by Lissa Morales in 2006 are still the best available and consist of a multiple step process (up to 12!) , not a single office visit,  and are intended  to address this problem logically,  with an alogrithm dependent on response to empiric treatment at  each progressive step  to determine a specific diagnosis with  minimal addtional testing needed. Therefore if adherence is an issue or can't be accurately verified,  it's very unlikely the standard evaluation and treatment will be successful here.    Furthermore, response to therapy (other than acute cough suppression, which should only be used short term with avoidance of narcotic containing cough syrups if  possible), can be a gradual process for which the patient may perceive immediate benefit.  Unlike going to an eye doctor where the best perscription is almost always the first one and is immediately effective, this is almost never the case in the management of chronic cough syndromes. Therefore the patient needs to commit up front to consistently adhere to recommendations  for up to 6 weeks of therapy directed at the likely underlying problem(s) before the response can be reasonably evaluated.   For now try max rx for gerd/ give one depomedrol and then eliminate cyclical cough with tramadol and regoup in 2 weeks using a trust but verify approach.  See instructions for specific recommendations which were reviewed directly with the patient who was given a copy with highlighter outlining the key components.

## 2014-08-25 NOTE — Progress Notes (Signed)
PFT done today. 

## 2014-08-25 NOTE — Progress Notes (Signed)
Subjective:    Patient ID: Latoya [name], female    DOB: 05-30-1951   MRN: [id number]    Brief patient profile:  52 yowf never smoker with tendency of recurrent cough x 2010 referred 09/18/2013 to pulmonary clinic Dr Melford Aase   History of Present Illness  09/18/2013 1st [facility] office visit/ Wert cc acute cough in Nov 2014 got better on mucinex and then worse since stopped it.  Problem is worse in evenings after supper and not flaring early in am, dry and croopy, maintained on ppi and carafate and using lots mint and menthol products to control it. Not better p albuterol rec  When coughing for any reason: delsym 2 tsp every 12 hours  protonix (pantaprazole) 40 mg Take 30- 60 min before your first and last meals of the day then back to one daily when no cough and no need for delsym GERD diet    06/19/2014 acute  ov/Wert re: recurrent cough fine x 9 months on just ppi qam then relapsed  Chief Complaint  Patient presents with  . Follow-up    productive cough yellowish for 3 weeks  aburpt onset p "caught cold" > persistent day > noct cough, inhaler didn't help, did not use delsym or double ppi as rec at prev ov  aldready rx zpak , cough med, refused pred asthmanex not working  Worse when speak, using cinamon gum  Unless coughing, not really sob and sleeping better  >>delsym and tramadol rx   07/03/2014 Follow up  Returns for follow up and med review  Reports cough is slightly improved with less mucus production.   Scheduled to see ENT next week She denies any chest pain, orthopnea, PND or leg swelling We reviewed all her medications organize them into a medication count with patient education. rec Take delsym two tsp every 12 hours and supplement if needed with  tramadol 50 mg up to 2 every 4 hours to suppress the urge to cough. Swallowing water or using ice chips/non mint and menthol containing candies (such as lifesavers or sugarless jolly ranchers) are also effective.   You should rest your voice and avoid activities that you know make you cough. Once you have eliminated the cough for 3 straight days try reducing the tramadol first,  then the delsym as tolerated.   Pantoprazole / protonix increase  Take 30- 60 min before your first and last meals of the day  For drainage take chlortrimeton (chlorpheniramine) 4 mg 2 at bedtime as needed.  (may cause drowsiness)    08/25/2014 f/u ov/Wert re: recurrent but persistent cough since late Sept 2015  Chief Complaint  Patient presents with  . Follow-up    Pt states that her cough is unchanged since her last visit. Cough is mainly non prod.    no cough at night at all, no need for inhaler day or night  Not following med calendar at all in terms of the action plan and also not taking ppi correctly.   No obvious patterns in  day to day or daytime variabilty or assoc cp or chest tightness, subjective wheeze overt sinus or hb symptoms. No unusual exp hx or h/o childhood pna/ asthma or knowledge of premature birth.  Sleeping ok without nocturnal  or early am exacerbation  of respiratory  c/o's or need for noct saba. Also denies any obvious fluctuation of symptoms with weather or environmental changes or other aggravating or alleviating factors except as outlined above   Current Medications,  Allergies, Complete Past Medical History, Past Surgical History, Family History, and Social History were reviewed in [employer] record.  ROS  The following are not active complaints unless bolded sore throat, dysphagia, dental problems, itching, sneezing,  nasal congestion or excess/ purulent secretions, ear ache,   fever, chills, sweats, unintended wt loss, pleuritic or exertional cp, hemoptysis,  orthopnea pnd or leg swelling, presyncope, palpitations, heartburn, abdominal pain, anorexia, nausea, vomiting, diarrhea  or change in bowel or urinary habits, change in stools or urine, dysuria,hematuria,  rash,  arthralgias, visual complaints, headache, numbness weakness or ataxia or problems with walking or coordination,  change in mood/affect or memory.                                     Objective:   Physical Exam   06/19/2014      165 >165 07/03/2014 > 08/25/2014  165   amb animated hoarse wf nad with occ throat clearing/ easily confused with details of care   HEENT: nl dentition, turbinates, and orophanx except for min cobblestoning. Nl external ear canals without cough reflex   NECK :  without JVD/Nodes/TM/ nl carotid upstrokes bilaterally   LUNGS: no acc muscle use, clear to A and P bilaterally without cough on insp or exp maneuvers   CV:  RRR  no s3 or murmur or increase in P2, no edema   ABD:  soft and nontender with nl excursion in the supine position. No bruits or organomegaly, bowel sounds nl  MS:  warm without deformities, calf tenderness, cyanosis or clubbing  SKIN: warm and dry without lesions           cxr 05/25/14 No active cardiopulmonary disease      Assessment & Plan:

## 2014-09-02 ENCOUNTER — Other Ambulatory Visit: Payer: Self-pay | Admitting: Internal Medicine

## 2014-09-14 ENCOUNTER — Ambulatory Visit: Payer: Self-pay | Admitting: Internal Medicine

## 2014-09-26 ENCOUNTER — Other Ambulatory Visit: Payer: Self-pay | Admitting: Internal Medicine

## 2014-10-08 ENCOUNTER — Other Ambulatory Visit (HOSPITAL_COMMUNITY): Payer: Self-pay | Admitting: Family Medicine

## 2014-10-08 DIAGNOSIS — Z1231 Encounter for screening mammogram for malignant neoplasm of breast: Secondary | ICD-10-CM

## 2014-10-20 ENCOUNTER — Other Ambulatory Visit: Payer: Self-pay | Admitting: Internal Medicine

## 2014-11-12 ENCOUNTER — Ambulatory Visit (HOSPITAL_COMMUNITY)
Admission: RE | Admit: 2014-11-12 | Discharge: 2014-11-12 | Disposition: A | Discharge status: Home / Self Care | Payer: 59 | Source: Ambulatory Visit | Attending: Family Medicine | Admitting: Family Medicine

## 2014-11-12 DIAGNOSIS — Z1231 Encounter for screening mammogram for malignant neoplasm of breast: Secondary | ICD-10-CM

## 2014-11-22 ENCOUNTER — Other Ambulatory Visit: Payer: Self-pay | Admitting: Physician Assistant

## 2015-01-19 ENCOUNTER — Other Ambulatory Visit: Payer: Self-pay | Admitting: Obstetrics & Gynecology

## 2015-01-19 DIAGNOSIS — R1032 Left lower quadrant pain: Secondary | ICD-10-CM

## 2015-01-20 ENCOUNTER — Ambulatory Visit
Admission: RE | Admit: 2015-01-20 | Discharge: 2015-01-20 | Disposition: A | Discharge status: Home / Self Care | Payer: 59 | Source: Ambulatory Visit | Attending: Obstetrics & Gynecology | Admitting: Obstetrics & Gynecology

## 2015-01-20 DIAGNOSIS — R1032 Left lower quadrant pain: Secondary | ICD-10-CM

## 2015-02-03 ENCOUNTER — Other Ambulatory Visit: Payer: Self-pay | Admitting: Family Medicine

## 2015-02-03 DIAGNOSIS — R229 Localized swelling, mass and lump, unspecified: Principal | ICD-10-CM

## 2015-02-03 DIAGNOSIS — IMO0002 Reserved for concepts with insufficient information to code with codable children: Secondary | ICD-10-CM

## 2015-02-08 ENCOUNTER — Ambulatory Visit
Admission: RE | Admit: 2015-02-08 | Discharge: 2015-02-08 | Disposition: A | Discharge status: Home / Self Care | Payer: 59 | Source: Ambulatory Visit | Attending: Family Medicine | Admitting: Family Medicine

## 2015-02-08 DIAGNOSIS — R229 Localized swelling, mass and lump, unspecified: Principal | ICD-10-CM

## 2015-02-08 DIAGNOSIS — IMO0002 Reserved for concepts with insufficient information to code with codable children: Secondary | ICD-10-CM

## 2015-02-24 ENCOUNTER — Ambulatory Visit (HOSPITAL_COMMUNITY)
Admission: RE | Admit: 2015-02-24 | Discharge: 2015-02-24 | Disposition: A | Discharge status: Home / Self Care | Payer: 59 | Source: Ambulatory Visit | Attending: Family Medicine | Admitting: Family Medicine

## 2015-02-24 ENCOUNTER — Other Ambulatory Visit (HOSPITAL_COMMUNITY): Payer: Self-pay | Admitting: Family Medicine

## 2015-02-24 ENCOUNTER — Encounter (HOSPITAL_COMMUNITY): Payer: Self-pay

## 2015-02-24 DIAGNOSIS — I700 Atherosclerosis of aorta: Secondary | ICD-10-CM | POA: Insufficient documentation

## 2015-02-24 DIAGNOSIS — R109 Unspecified abdominal pain: Secondary | ICD-10-CM

## 2015-02-24 DIAGNOSIS — K449 Diaphragmatic hernia without obstruction or gangrene: Secondary | ICD-10-CM | POA: Insufficient documentation

## 2015-02-24 LAB — BUN: BUN: 13 mg/dL (ref 6–20)

## 2015-02-24 LAB — CREATININE, SERUM
Creatinine, Ser: 0.98 mg/dL (ref 0.44–1.00)
GFR calc Af Amer: >60 mL/min (ref >60)
GFR calc non Af Amer: 60 mL/min — ABNORMAL LOW (ref >60)

## 2015-02-24 MED ORDER — IOHEXOL 300 MG/ML  SOLN: 100.0000 mL | Freq: Once | INTRAMUSCULAR | Status: AC | PRN

## 2015-03-09 ENCOUNTER — Encounter: Payer: Self-pay | Admitting: Podiatry

## 2015-03-09 ENCOUNTER — Ambulatory Visit (INDEPENDENT_AMBULATORY_CARE_PROVIDER_SITE_OTHER): Payer: 59

## 2015-03-09 ENCOUNTER — Ambulatory Visit (INDEPENDENT_AMBULATORY_CARE_PROVIDER_SITE_OTHER): Payer: 59 | Admitting: Podiatry

## 2015-03-09 VITALS — BP 136/81 | HR 91 | Resp 18

## 2015-03-09 DIAGNOSIS — M79672 Pain in left foot: Secondary | ICD-10-CM

## 2015-03-09 DIAGNOSIS — M7672 Peroneal tendinitis, left leg: Secondary | ICD-10-CM | POA: Diagnosis not present

## 2015-03-10 NOTE — Progress Notes (Signed)
She presents today with chief complaint of pain to the lateral aspect of her left foot. She denies any trauma but states is being going on for several months now. Seems to be getting worse. She's done nothing for the pain. States that the pain is only present when she is walking or if she lays her foot to the lateral side.  Objective: Vital signs are stable she's alert and oriented 3. Pulses are strongly palpable bilateral. Neurologic sensorium is intact per Semmes-Weinstein monofilament.. Deep tendon reflexes are intact. Muscle strength +5/+5 bilateral. She does have pain on abduction against resistance and plantarflexion eversion of her left foot the majority of her symptoms are located around the inferior lateral malleolar region with some soft fluctuance within the tendon sheath of the peroneals. She has previously had surgery along this peroneal region.  Assessment: Peroneal tendinitis left.  Plan: We will start her on anti-inflammatory's I also injected the area today with Kenalog and local and aesthetic. Placed her in a [facility] instructed her to ice on a regular basis and I will follow-up with her in 1 month at which time if she is not feeling better I think it prudent to consider MRI of this left ankle.

## 2015-03-29 ENCOUNTER — Telehealth: Payer: Self-pay | Admitting: *Deleted

## 2015-03-29 NOTE — Telephone Encounter (Signed)
Pt states the black boot is killing her back, is there anything else she could use.

## 2015-04-06 ENCOUNTER — Ambulatory Visit: Payer: 59 | Admitting: Podiatry

## 2015-04-20 ENCOUNTER — Encounter: Payer: Self-pay | Admitting: Emergency Medicine

## 2015-05-24 ENCOUNTER — Other Ambulatory Visit: Payer: Self-pay | Admitting: Internal Medicine

## 2015-06-10 ENCOUNTER — Telehealth: Payer: Self-pay | Admitting: Internal Medicine

## 2015-06-10 MED ORDER — PANTOPRAZOLE SODIUM 40 MG PO TBEC: 40.0000 mg | DELAYED_RELEASE_TABLET | Freq: Two times a day (BID) | ORAL | Status: DC

## 2015-06-10 NOTE — Telephone Encounter (Signed)
Pt requesting refill on protonix.  This has been sent.  Nothing further needed.

## 2015-07-01 ENCOUNTER — Emergency Department (HOSPITAL_COMMUNITY)
Admission: EM | Admit: 2015-07-01 | Discharge: 2015-07-01 | Disposition: A | Discharge status: Home / Self Care | Payer: PPO | Attending: Emergency Medicine | Admitting: Emergency Medicine

## 2015-07-01 ENCOUNTER — Emergency Department (HOSPITAL_COMMUNITY): Payer: PPO

## 2015-07-01 ENCOUNTER — Encounter (HOSPITAL_COMMUNITY): Payer: Self-pay | Admitting: *Deleted

## 2015-07-01 DIAGNOSIS — W1789XA Other fall from one level to another, initial encounter: Secondary | ICD-10-CM | POA: Diagnosis not present

## 2015-07-01 DIAGNOSIS — S3991XA Unspecified injury of abdomen, initial encounter: Secondary | ICD-10-CM | POA: Diagnosis not present

## 2015-07-01 DIAGNOSIS — E785 Hyperlipidemia, unspecified: Secondary | ICD-10-CM | POA: Insufficient documentation

## 2015-07-01 DIAGNOSIS — Z7982 Long term (current) use of aspirin: Secondary | ICD-10-CM | POA: Diagnosis not present

## 2015-07-01 DIAGNOSIS — R0602 Shortness of breath: Secondary | ICD-10-CM | POA: Insufficient documentation

## 2015-07-01 DIAGNOSIS — K219 Gastro-esophageal reflux disease without esophagitis: Secondary | ICD-10-CM | POA: Diagnosis not present

## 2015-07-01 DIAGNOSIS — F419 Anxiety disorder, unspecified: Secondary | ICD-10-CM | POA: Insufficient documentation

## 2015-07-01 DIAGNOSIS — Z86010 Personal history of colonic polyps: Secondary | ICD-10-CM | POA: Diagnosis not present

## 2015-07-01 DIAGNOSIS — Z79899 Other long term (current) drug therapy: Secondary | ICD-10-CM | POA: Insufficient documentation

## 2015-07-01 DIAGNOSIS — S3992XA Unspecified injury of lower back, initial encounter: Secondary | ICD-10-CM | POA: Diagnosis present

## 2015-07-01 DIAGNOSIS — Z87442 Personal history of urinary calculi: Secondary | ICD-10-CM | POA: Insufficient documentation

## 2015-07-01 DIAGNOSIS — Y9289 Other specified places as the place of occurrence of the external cause: Secondary | ICD-10-CM | POA: Diagnosis not present

## 2015-07-01 DIAGNOSIS — I10 Essential (primary) hypertension: Secondary | ICD-10-CM | POA: Insufficient documentation

## 2015-07-01 DIAGNOSIS — Y9389 Activity, other specified: Secondary | ICD-10-CM | POA: Insufficient documentation

## 2015-07-01 DIAGNOSIS — S32009A Unspecified fracture of unspecified lumbar vertebra, initial encounter for closed fracture: Secondary | ICD-10-CM | POA: Insufficient documentation

## 2015-07-01 DIAGNOSIS — Y998 Other external cause status: Secondary | ICD-10-CM | POA: Diagnosis not present

## 2015-07-01 DIAGNOSIS — Z880 Allergy status to penicillin: Secondary | ICD-10-CM | POA: Insufficient documentation

## 2015-07-01 DIAGNOSIS — M1990 Unspecified osteoarthritis, unspecified site: Secondary | ICD-10-CM | POA: Insufficient documentation

## 2015-07-01 DIAGNOSIS — F329 Major depressive disorder, single episode, unspecified: Secondary | ICD-10-CM | POA: Insufficient documentation

## 2015-07-01 HISTORY — DX: Disorder of kidney and ureter, unspecified: N28.9

## 2015-07-01 LAB — CBC WITH DIFFERENTIAL/PLATELET
Basophils Absolute: 0 10*3/uL (ref 0.0–0.1)
Basophils Relative: 0 %
Eosinophils Absolute: 0.1 10*3/uL (ref 0.0–0.7)
Eosinophils Relative: 1 %
HCT: 44.1 % (ref 36.0–46.0)
Hemoglobin: 14.5 g/dL (ref 12.0–15.0)
Lymphocytes Relative: 23 %
Lymphs Abs: 1.9 10*3/uL (ref 0.7–4.0)
MCH: 29.7 pg (ref 26.0–34.0)
MCHC: 32.9 g/dL (ref 30.0–36.0)
MCV: 90.2 fL (ref 78.0–100.0)
Monocytes Absolute: 0.7 10*3/uL (ref 0.1–1.0)
Monocytes Relative: 8 %
Neutro Abs: 5.7 10*3/uL (ref 1.7–7.7)
Neutrophils Relative %: 68 %
Platelets: 203 10*3/uL (ref 150–400)
RBC: 4.89 MIL/uL (ref 3.87–5.11)
RDW: 12.5 % (ref 11.5–15.5)
WBC: 8.5 10*3/uL (ref 4.0–10.5)

## 2015-07-01 LAB — BASIC METABOLIC PANEL
Anion gap: 12 (ref 5–15)
BUN: 15 mg/dL (ref 6–20)
CO2: 23 mmol/L (ref 22–32)
Calcium: 10.3 mg/dL (ref 8.9–10.3)
Chloride: 107 mmol/L (ref 101–111)
Creatinine, Ser: 1.17 mg/dL — ABNORMAL HIGH (ref 0.44–1.00)
GFR calc Af Amer: 55 mL/min — ABNORMAL LOW (ref >60)
GFR calc non Af Amer: 48 mL/min — ABNORMAL LOW (ref >60)
Glucose, Bld: 82 mg/dL (ref 65–99)
Potassium: 3.6 mmol/L (ref 3.5–5.1)
Sodium: 142 mmol/L (ref 135–145)

## 2015-07-01 MED ORDER — ONDANSETRON HCL 4 MG/2ML IJ SOLN
4.0000 mg | Freq: Once | INTRAMUSCULAR | Status: AC
  Administered 2015-07-01: 4 mg via INTRAVENOUS
  Filled 2015-07-01: qty 2

## 2015-07-01 MED ORDER — DIAZEPAM 5 MG/ML IJ SOLN
5.0000 mg | Freq: Once | INTRAMUSCULAR | Status: AC
  Administered 2015-07-01: 5 mg via INTRAVENOUS
  Filled 2015-07-01: qty 2

## 2015-07-01 MED ORDER — OXYCODONE-ACETAMINOPHEN 5-325 MG PO TABS
1.0000 | ORAL_TABLET | Freq: Once | ORAL | Status: AC
  Administered 2015-07-01: 1 via ORAL
  Filled 2015-07-01: qty 1

## 2015-07-01 MED ORDER — DOCUSATE SODIUM 100 MG PO CAPS: 100.0000 mg | ORAL_CAPSULE | Freq: Two times a day (BID) | ORAL | Status: DC

## 2015-07-01 MED ORDER — IOHEXOL 300 MG/ML  SOLN
80.0000 mL | Freq: Once | INTRAMUSCULAR | Status: AC | PRN
  Administered 2015-07-01: 80 mL via INTRAVENOUS

## 2015-07-01 MED ORDER — MORPHINE SULFATE (PF) 4 MG/ML IV SOLN
4.0000 mg | INTRAVENOUS | Status: DC | PRN
  Administered 2015-07-01: 4 mg via INTRAVENOUS
  Filled 2015-07-01: qty 1

## 2015-07-01 MED ORDER — METHOCARBAMOL 500 MG PO TABS: 500.0000 mg | ORAL_TABLET | Freq: Three times a day (TID) | ORAL | Status: DC | PRN

## 2015-07-01 MED ORDER — MORPHINE SULFATE (PF) 4 MG/ML IV SOLN
4.0000 mg | Freq: Once | INTRAVENOUS | Status: AC
  Administered 2015-07-01: 4 mg via INTRAVENOUS
  Filled 2015-07-01: qty 1

## 2015-07-01 MED ORDER — OXYCODONE-ACETAMINOPHEN 5-325 MG PO TABS: 2.0000 | ORAL_TABLET | ORAL | Status: DC | PRN

## 2015-07-01 NOTE — ED Notes (Signed)
Pt was up on ladder and fell 12 feet and landed on porch and side of porch.  Pt hit head and has superficial lac to right side of head, NO LOC.  No neck pain.  Pt is hurting in chest and states she landed on her chest.  Pt feels sob.  sats 97%.  Pt is on ASA.  Pt is hurting in left back and side of stomach

## 2015-07-01 NOTE — Discharge Instructions (Signed)
Minimize your upright activity. Slowly increase your activity as your symptoms improved.  transverse Process Fracture Each bone of the spine (vertebra) has portions of bone that extend off to either side of the spine. These portions of bone are called transverse processes. A transverse process fracture, which is also called a rotation spine fracture, is a break in a transverse process. CAUSES This condition may be caused by:  A fall from a height.  A car accident.  A sports injury.  A gunshot wound.  A hard, direct hit to the back. This kind of fracture often results from a sudden and severe bending of the spine to one side. RISK FACTORS This condition is more likely to develop in:  People who have thinning and loss of density in the bones (osteoporosis).  People who play a contact sport. SYMPTOMS The main symptom of this condition is back pain. The pain may be felt on the side of the spine (flank) where the fracture is. It may get worse when you move or take deep a deep breath. DIAGNOSIS This condition may be diagnosed based on symptoms, a medical history, and a physical exam. During the physical exam, your health care provider may tap along the length of your spine to see where you feel pain. Imaging tests may be done to confirm the diagnosis. They may include:  X-rays.  A CT scan.  MRI. TREATMENT Most transverse process fractures heal on their own with time and with rest. Treatment may involve supportive care, such as:  A back brace.  Activity limits.  Pain medicine.  Muscle-relaxing medicine.  Physical therapy. HOME CARE INSTRUCTIONS General Instructions  Take medicines only as directed by your health care provider.  Do not drive or operate heavy machinery while taking pain medicine.  Wear your neck or back brace as directed by your health care provider.  Keep all follow-up visits as directed by your health care provider. This is important. It can help to  prevent permanent injury, disability, and long-lasting (chronic) pain. Activity  Stay in bed (on bed rest) only as directed by your health care provider. Being on bed rest for too long can make your condition worse.  Return to your normal activities when your health care provider says it is okay. Ask if there are any activities that you should not do.  Do your physical therapy as recommended by your health care provider. SEEK MEDICAL CARE IF:  You have a fever.  You develop a cough that makes your pain worse.  Your pain medicine is not helping.  Your pain does not get better over time.  You cannot return to your normal activities as planned or expected. SEEK IMMEDIATE MEDICAL CARE IF:  Your pain is very bad and it suddenly gets worse.  You are unable to move any body part (paralysis) that is below the level of your injury.  You have numbness, tingling, or weakness in any body part that is below the level of your injury.  You cannot control your bladder or bowels.   This information is not intended to replace advice given to you by your health care provider. Make sure you discuss any questions you have with your health care provider.   Document Released: 12/06/2006 Document Revised: 01/05/2015 Document Reviewed: 08/25/2014 Elsevier Interactive Patient Education [employer].

## 2015-07-01 NOTE — ED Notes (Signed)
Patient transported to CT 

## 2015-07-01 NOTE — ED Notes (Signed)
Returned from ct scan 

## 2015-07-01 NOTE — ED Provider Notes (Signed)
CSN: [id number]     Arrival date & time 07/01/15  1411 History   First MD Initiated Contact with Patient 07/01/15 1456     Chief Complaint  Patient presents with  . Fall  . Shortness of Breath      HPI  Patient resists ventilation after a fall. She was on a ladder outside of her house cleaning her gutters. The base Y down. She fell straight down. Landed essentially prone may be a bit more to her left side against an imaging of her porch. Complains of pain in her left lower ribs and left mid abdomen. Denies strike to the head. She does have a scrape on her for head where she states she went to a bush. No headache face pain pain or back pain. All x-rays. She is able to get up and walk inside.  Past Medical History  Diagnosis Date  . Hypertension   . Hyperlipemia   . Anxiety and depression   . Arthritis   . Colon polyps   . Hemorrhoids   . GERD (gastroesophageal reflux disease)   . Panic disorder   . Kidney stones   . Stomach ulcer   . Unspecified essential hypertension 11/26/2013  . Mixed hyperlipidemia 11/26/2013  . Renal insufficiency     stage 3 kidney disease per her PCP   Past Surgical History  Procedure Laterality Date  . Abdominal hysterectomy    . Tubal ligation    . Foot surgery  2008    left  . Colonoscopy    . Upper gastrointestinal endoscopy    . Tonsillectomy    . Oophorectomy    . Carpometacarpel suspension plasty Right 12/08/2013    Procedure: CARPOMETACARPEL [facility]) SUSPENSION PLASTY;  Surgeon: Jolyn Nap, MD;  Location: [place];  Service: Orthopedics;  Laterality: Right;   Family History  Problem Relation Age of Onset  . Heart disease Brother     x2  . Stroke Brother   . Heart disease Father   . Asthma Mother    Social History  Substance Use Topics  . Smoking status: Never Smoker   . Smokeless tobacco: Never Used  . Alcohol Use: No   OB History    No data available     Review of Systems  Constitutional: Negative for  fever, chills, diaphoresis, appetite change and fatigue.  HENT: Negative for mouth sores, sore throat and trouble swallowing.   Eyes: Negative for visual disturbance.  Respiratory: Negative for cough, chest tightness, shortness of breath and wheezing.   Cardiovascular: Positive for chest pain.  Gastrointestinal: Positive for abdominal pain. Negative for nausea, vomiting, diarrhea and abdominal distention.  Endocrine: Negative for polydipsia, polyphagia and polyuria.  Genitourinary: Negative for dysuria, frequency and hematuria.  Musculoskeletal: Negative for gait problem.  Skin: Negative for color change, pallor and rash.  Neurological: Negative for dizziness, syncope, light-headedness and headaches.  Hematological: Does not bruise/bleed easily.  Psychiatric/Behavioral: Negative for behavioral problems and confusion.      Allergies  Other; Prednisone; Vioxx; Entex t; Levaquin; Penicillins; Sulfa antibiotics; and Trovan  Home Medications   Prior to Admission medications   Medication Sig Start Date End Date Taking? Authorizing Provider  Alum & Mag Hydroxide-Simeth (MAGIC MOUTHWASH) SOLN Take 5 mLs by mouth every 6 (six) hours as needed (sore throat).   Yes Historical Provider, MD  aspirin 81 MG tablet Take 81 mg by mouth at bedtime.    Yes Historical Provider, MD  Biotin 1000 MCG tablet Take  1,000 mcg by mouth 3 (three) times daily.   Yes Historical Provider, MD  bumetanide (BUMEX) 2 MG tablet Take 2 mg by mouth 3 (three) times daily as needed (leg swelling).    Yes Historical Provider, MD  Cholecalciferol (VITAMIN D3) 5000 UNITS CAPS Take 1 capsule by mouth daily.   Yes Historical Provider, MD  clonazePAM (KLONOPIN) 1 MG tablet Take 1 mg by mouth 2 (two) times daily as needed for anxiety.  05/31/15  Yes Historical Provider, MD  diltiazem (CARDIZEM) 120 MG tablet Take 120 mg by mouth daily.   Yes Historical Provider, MD  pantoprazole (PROTONIX) 40 MG tablet Take 1 tablet (40 mg total) by  mouth 2 (two) times daily. Patient taking differently: Take 40 mg by mouth daily.  06/10/15  Yes Tanda Rockers, MD  pravastatin (PRAVACHOL) 40 MG tablet TAKE 1 TABLET BY MOUTH EVERY NIGHT AT BEDTIME FOR CHOLESTEROL 06/07/14  Yes Unk Pinto, MD  Probiotic Product (PROBIOTIC ACIDOPHILUS) CAPS Take 1 capsule by mouth 2 (two) times daily.    Yes Historical Provider, MD  sertraline (ZOLOFT) 100 MG tablet Take 100 mg by mouth daily. 05/31/15  Yes Historical Provider, MD  sucralfate (CARAFATE) 1 G tablet TAKE 1 TABLET BY MOUTH FOUR TIMES DAILY Patient taking differently: TAKE 1 TABLET BY MOUTH TWICE DAILY 08/24/14  Yes Gatha Mayer, MD  vitamin B-12 (CYANOCOBALAMIN) 500 MCG tablet Take 500 mcg by mouth daily.   Yes Historical Provider, MD  docusate sodium (COLACE) 100 MG capsule Take 1 capsule (100 mg total) by mouth every 12 (twelve) hours. 07/01/15   Tanna Furry, MD  methocarbamol (ROBAXIN) 500 MG tablet Take 1 tablet (500 mg total) by mouth 3 (three) times daily between meals as needed. 07/01/15   Tanna Furry, MD  oxyCODONE-acetaminophen (PERCOCET/ROXICET) 5-325 MG tablet Take 2 tablets by mouth every 4 (four) hours as needed. 07/01/15   Tanna Furry, MD  sucralfate (CARAFATE) 1 G tablet TAKE 1 TABLET BY MOUTH FOUR TIMES DAILY Patient not taking: Reported on 07/01/2015 09/28/14   Gatha Mayer, MD  traMADol (ULTRAM) 50 MG tablet Take 1 tablet (50 mg total) by mouth every 6 (six) hours as needed. Patient not taking: Reported on 07/01/2015 06/19/14   Tanda Rockers, MD  traMADol Veatrice Bourbon) 50 MG tablet 1-2 every 4 hours as needed for cough or pain Patient not taking: Reported on 07/01/2015 08/25/14   Tanda Rockers, MD   BP 155/71 mmHg  Pulse 70  Temp(Src) 97.9 F (36.6 C) (Oral)  Resp 18  SpO2 98% Physical Exam  Constitutional: She is oriented to person, place, and time. She appears well-developed and well-nourished. No distress.  HENT:  Head: Normocephalic.    Eyes: Conjunctivae are normal.  Pupils are equal, round, and reactive to light. No scleral icterus.  Neck: Normal range of motion. Neck supple. No thyromegaly present.  Cardiovascular: Normal rate and regular rhythm.  Exam reveals no gallop and no friction rub.   No murmur heard. Pulmonary/Chest: Effort normal and breath sounds normal. No respiratory distress. She has no wheezes. She has no rales.    Tenderness across left lower ribs subcostal upper abdomen. Clear bilateral breath sounds. No subcutaneous air in the neck or chest.  Abdominal: Soft. Bowel sounds are normal. She exhibits no distension. There is no tenderness. There is no rebound.    Left upper abdominal tenderness. Nontender over the pelvis.  Musculoskeletal: Normal range of motion.  Moves all 4 extremities without pain or deficits.  Neurological: She is alert and oriented to person, place, and time.  Skin: Skin is warm and dry. No rash noted.  Psychiatric: She has a normal mood and affect. Her behavior is normal.    ED Course  Procedures (including critical care time) Labs Review Labs Reviewed  BASIC METABOLIC PANEL - Abnormal; Notable for the following:    Creatinine, Ser 1.17 (*)    GFR calc non Af Amer 48 (*)    GFR calc Af Amer 55 (*)    All other components within normal limits  CBC WITH DIFFERENTIAL/PLATELET    Imaging Review Ct Abdomen Pelvis W Contrast  07/01/2015  CLINICAL DATA:  12 foot fall from ladder onto porch. Left flank and posterior rib pain. EXAM: CT ABDOMEN AND PELVIS WITH CONTRAST TECHNIQUE: Multidetector CT imaging of the abdomen and pelvis was performed using the standard protocol following bolus administration of intravenous contrast. CONTRAST:  32mL OMNIPAQUE IOHEXOL 300 MG/ML  SOLN COMPARISON:  02/24/2015 FINDINGS: Lower chest and abdominal wall: Band of subcutaneous reticulation to the left flank consistent with contusion. No focal hematoma. Mild dependent atelectasis at the bases. Hepatobiliary: No evidence of injury. Sub  cm low-density in the high right liver is too small for characterization, but stable since at least 2014 and benign.No evidence of biliary obstruction or stone. Pancreas: Unremarkable. Spleen: Unremarkable. Adrenals/Urinary Tract: Negative adrenals. No evidence of injury. Tiny cortical low densities in left kidney is too small to characterize but non concerning. Unremarkable bladder. Reproductive:Hysterectomy with normal right ovary and negative left adnexa. Stomach/Bowel:  No evidence of injury Vascular/Lymphatic: No acute vascular abnormality. No mass or adenopathy. Peritoneal: No ascites or pneumoperitoneum. Musculoskeletal: L1 left transverse process fracture without significant displacement. Advanced lower lumbar facet arthritis with grade 1 anterolisthesis at L4-5 and interspinous degenerative changes at L3-4 and L4-5. Marked T11-12 disc narrowing and degenerative endplate sclerosis. IMPRESSION: 1. Left flank soft tissue contusion and L1 left transverse process fracture. 2. No evidence of intra-abdominal injury. Electronically Signed   By: Monte Fantasia M.D.   On: 07/01/2015 17:38   Dg Chest Port 1 View  07/01/2015  CLINICAL DATA:  Pt fell off a 12 ft ladder. Mid sternal CP and left posterior rib pain. Some SOB. EXAM: PORTABLE CHEST 1 VIEW COMPARISON:  05/25/2014 FINDINGS: The heart size and mediastinal contours are within normal limits. Both lungs are clear. The visualized skeletal structures are unremarkable. IMPRESSION: No active disease. Electronically Signed   By: Nolon Nations M.D.   On: 07/01/2015 15:20   I have personally reviewed and evaluated these images and lab results as part of my medical decision-making.   EKG Interpretation None      MDM   Final diagnoses:  Fracture of transverse process of lumbar vertebra, closed, initial encounter [facility])    CT scan shows no internal derangements. This has some flank soft tissue contusion hand single transverse process lumbar fracture.  Pain is improving. Given additional IV pain medication and Valium for spasm. Plan is home. Limited upright activity. Slowly increase activity as symptoms improve.    Tanna Furry, MD 07/01/15 1816

## 2015-08-03 ENCOUNTER — Other Ambulatory Visit: Payer: Self-pay | Admitting: Internal Medicine

## 2015-08-03 NOTE — Telephone Encounter (Signed)
Ok to refill Sir? 

## 2015-08-03 NOTE — Telephone Encounter (Signed)
I suggest she ask PCP to refill - not seen since 2014

## 2015-08-19 ENCOUNTER — Telehealth: Payer: Self-pay | Admitting: Internal Medicine

## 2015-08-19 NOTE — Telephone Encounter (Signed)
Patient will come for an appt on 08/23/15 1:45 for abdominal pain

## 2015-08-19 NOTE — Telephone Encounter (Signed)
Attempted to return call.  "mailbox is full".  I was unable to leave a message.

## 2015-08-23 ENCOUNTER — Encounter: Payer: Self-pay | Admitting: Internal Medicine

## 2015-08-23 ENCOUNTER — Ambulatory Visit (INDEPENDENT_AMBULATORY_CARE_PROVIDER_SITE_OTHER): Payer: PPO | Admitting: Internal Medicine

## 2015-08-23 VITALS — BP 130/68 | HR 64 | Ht 63.5 in | Wt 155.5 lb

## 2015-08-23 DIAGNOSIS — R1032 Left lower quadrant pain: Secondary | ICD-10-CM

## 2015-08-23 DIAGNOSIS — R0781 Pleurodynia: Secondary | ICD-10-CM

## 2015-08-23 NOTE — Progress Notes (Signed)
   Subjective:    Patient ID: Latoya [name], female    DOB: 1951/05/26, 64 y.o.   MRN: [id number] Cc: LLQ pain HPI [place] and broke ribs on left - late Oct 2016 She is better but is having some left lower quadrant and flank pain and also hears some rumbling in her GI tract and wants to be sure there is nothing amiss there. No change in bowel habits. Pain is worse with twisting and moving Medications, allergies, past medical history, past surgical history, family history and social history are reviewed and updated in the EMR.   Review of Systems As above    Objective:   Physical Exam  BP 130/68 mmHg  Pulse 64  Ht 5' 3.5" (1.613 m)  Wt 155 lb 8 oz (70.534 kg)  BMI 27.11 kg/m2 She is tender over the left lateral posterior and anterior inferior ribs. There is some mild left lower quadrant tenderness it is clearly worse with muscle tension. Bowel sounds are present.    Assessment & Plan:   1. Rib pain on left side   2. Abdominal wall pain in left lower quadrant    I reassured her. I suggested she could perhaps try topical N Sayed ointment or gel, she prefers a tough it out and use Tylenol. She does have some renal insufficiency and was to be very careful with any oral NSAIDs fact not use them. She is aware of that. I've explained that this is related to her rib fractures, she actually also fractured the process on L1 in the fall. I'll think that has anything to do with it.

## 2015-08-23 NOTE — Patient Instructions (Signed)
  Follow up with Dr Gessner as needed.   I appreciate the opportunity to care for you. Carl Gessner, MD, FACG 

## 2015-09-03 ENCOUNTER — Other Ambulatory Visit: Payer: Self-pay | Admitting: Family Medicine

## 2015-09-03 ENCOUNTER — Other Ambulatory Visit (HOSPITAL_COMMUNITY): Payer: Self-pay | Admitting: Family Medicine

## 2015-09-03 ENCOUNTER — Ambulatory Visit (HOSPITAL_COMMUNITY)
Admission: RE | Admit: 2015-09-03 | Discharge: 2015-09-03 | Disposition: A | Discharge status: Home / Self Care | Payer: PPO | Source: Ambulatory Visit | Attending: Family Medicine | Admitting: Family Medicine

## 2015-09-03 DIAGNOSIS — R0982 Postnasal drip: Secondary | ICD-10-CM | POA: Diagnosis not present

## 2015-09-03 DIAGNOSIS — R05 Cough: Secondary | ICD-10-CM | POA: Diagnosis not present

## 2015-09-03 DIAGNOSIS — R059 Cough, unspecified: Secondary | ICD-10-CM

## 2015-09-03 DIAGNOSIS — R0989 Other specified symptoms and signs involving the circulatory and respiratory systems: Secondary | ICD-10-CM | POA: Insufficient documentation

## 2015-09-03 DIAGNOSIS — R079 Chest pain, unspecified: Secondary | ICD-10-CM | POA: Diagnosis not present

## 2015-09-06 ENCOUNTER — Other Ambulatory Visit: Payer: Self-pay | Admitting: Internal Medicine

## 2015-10-11 ENCOUNTER — Other Ambulatory Visit: Payer: Self-pay

## 2015-10-11 DIAGNOSIS — Z1231 Encounter for screening mammogram for malignant neoplasm of breast: Secondary | ICD-10-CM

## 2015-11-10 ENCOUNTER — Telehealth: Payer: Self-pay | Admitting: Internal Medicine

## 2015-11-10 NOTE — Telephone Encounter (Signed)
Fine with me

## 2015-11-10 NOTE — Telephone Encounter (Signed)
Called and spoke to pt. Pt is requesting to change providers from Dr. Melvyn Novas to Dr. Ashok Cordia.   Dr. Melvyn Novas please advise if ok to change providers. Thanks.

## 2015-11-10 NOTE — Telephone Encounter (Signed)
Ok with me 

## 2015-11-10 NOTE — Telephone Encounter (Signed)
Please advise Dr. Ashok Cordia thanks

## 2015-11-11 NOTE — Telephone Encounter (Signed)
Spoke with pt and advised of OK to switch from [facility] to [facility]. Pt scheduled appt for 12/07/15. Pt advised to call us if she needs anything sooner.

## 2015-11-12 ENCOUNTER — Ambulatory Visit: Payer: Self-pay | Admitting: Internal Medicine

## 2015-11-12 ENCOUNTER — Encounter: Payer: Self-pay | Admitting: Pulmonary Disease

## 2015-11-12 ENCOUNTER — Ambulatory Visit (INDEPENDENT_AMBULATORY_CARE_PROVIDER_SITE_OTHER): Payer: PPO | Admitting: Pulmonary Disease

## 2015-11-12 VITALS — BP 122/74 | HR 63 | Ht 64.0 in | Wt 154.8 lb

## 2015-11-12 DIAGNOSIS — R042 Hemoptysis: Secondary | ICD-10-CM | POA: Diagnosis not present

## 2015-11-12 DIAGNOSIS — J209 Acute bronchitis, unspecified: Secondary | ICD-10-CM | POA: Diagnosis not present

## 2015-11-12 DIAGNOSIS — K219 Gastro-esophageal reflux disease without esophagitis: Secondary | ICD-10-CM

## 2015-11-12 MED ORDER — BENZONATATE 100 MG PO CAPS: 100.0000 mg | ORAL_CAPSULE | Freq: Three times a day (TID) | ORAL | Status: DC | PRN

## 2015-11-12 NOTE — Patient Instructions (Addendum)
   Finish your antibiotics.  Remember to rinse, gargle, hands. After use the Symbicort inhaler to prevent thrush. Used 2 inhalations of this device twice daily.  I'm prescribing Tessalon Perles to help with your cough.  We will do breathing tests at your next appointment with me  Please call my office if you have any more bloody phlegm or if your symptoms get worse.  I will see you back in 1-2 months  TESTS ORDERED: 1. Full pulmonary function testing at next appointment 2. Sputum culture for AFB, Fungus, & Bacteria

## 2015-11-12 NOTE — Progress Notes (Signed)
Subjective:    Patient ID: Latoya [name], female    DOB: 06-Nov-1950, 65 y.o.   MRN: [id number]  C.C.:  Acute visit for Productive Cough w/ known GERD.  HPI Cough: She was last seen in our office in 2015. On last Saturday she had progressively worsening sore throat. Her PCP prescribed Avelox and Doxycycline starting on Monday. She was prescribed a 7 day course of each and finishes Tomorrow. Cough produces a thick, yellow mucus. Small volume of mucus. Her husband is also hospitalized in hospital right now for hypoxia with known COPD since Tuesday. She has also had significant wheezing. Denies any sinus congestion or pressure. No other recent sick contacts. Did have hemoptysis once the other morning. She reports she has had occasional pink-tinged mucus during this episode.  GERD:  Follows with GI. She reports intermittent symptoms depending on dietary indiscretion. No morning brash water taste. She is taking Protonix bid & Carafate qid.   Review of Systems Denies any fever, chills, or sweats. She does have chest all pain with coughing. No other chest pain or pressure.   Allergies  Allergen Reactions  . Other     All pain medications: Unknown/ CODEINE  . Prednisone Other (See Comments)    DYSPHORIA  . Vioxx [Rofecoxib]   . Entex T [Pseudoephedrine-Guaifenesin] Palpitations  . Levaquin [Levofloxacin] Rash    Tolerated Avelox without adverse effect 11/08/15.  Marland Kitchen Penicillins Rash  . Sulfa Antibiotics Rash  . Trovan [Alatrofloxacin] Rash    Current Outpatient Prescriptions on File Prior to Visit  Medication Sig Dispense Refill  . Alum & Mag Hydroxide-Simeth (MAGIC MOUTHWASH) SOLN Take 5 mLs by mouth every 6 (six) hours as needed (sore throat).    Marland Kitchen aspirin 81 MG tablet Take 81 mg by mouth at bedtime.     . Biotin 1000 MCG tablet Take 1,000 mcg by mouth 3 (three) times daily.    . Cholecalciferol (VITAMIN D3) 5000 UNITS CAPS Take 1 capsule by mouth daily.    . clonazePAM (KLONOPIN) 1 MG  tablet Take 1 mg by mouth 2 (two) times daily as needed for anxiety.   5  . diltiazem (CARDIZEM) 120 MG tablet Take 120 mg by mouth 2 (two) times daily.     . methocarbamol (ROBAXIN) 500 MG tablet Take 1 tablet (500 mg total) by mouth 3 (three) times daily between meals as needed. 20 tablet 0  . pantoprazole (PROTONIX) 40 MG tablet TAKE 1 TABLET(40 MG) BY MOUTH TWICE DAILY 60 tablet 0  . pravastatin (PRAVACHOL) 40 MG tablet TAKE 1 TABLET BY MOUTH EVERY NIGHT AT BEDTIME FOR CHOLESTEROL 90 tablet 1  . Probiotic Product (PROBIOTIC ACIDOPHILUS) CAPS Take 1 capsule by mouth 2 (two) times daily.     . sertraline (ZOLOFT) 100 MG tablet Take 100 mg by mouth daily.  3  . sucralfate (CARAFATE) 1 G tablet TAKE 1 TABLET BY MOUTH FOUR TIMES DAILY (Patient taking differently: TAKE 1 TABLET BY MOUTH 4 times DAILY) 360 tablet 0  . vitamin B-12 (CYANOCOBALAMIN) 500 MCG tablet Take 500 mcg by mouth daily.     No current facility-administered medications on file prior to visit.    Past Medical History  Diagnosis Date  . Hypertension   . Hyperlipemia   . Anxiety and depression   . Arthritis   . Colon polyps   . Hemorrhoids   . GERD (gastroesophageal reflux disease)   . Panic disorder   . Kidney stones   . Stomach ulcer   .  Unspecified essential hypertension 11/26/2013  . Mixed hyperlipidemia 11/26/2013  . Renal insufficiency     stage 3 kidney disease per her PCP    Past Surgical History  Procedure Laterality Date  . Abdominal hysterectomy    . Tubal ligation    . Foot surgery  2008    left  . Colonoscopy    . Upper gastrointestinal endoscopy    . Oophorectomy    . Carpometacarpel suspension plasty Right 12/08/2013    Procedure: CARPOMETACARPEL [facility]) SUSPENSION PLASTY;  Surgeon: Jolyn Nap, MD;  Location: [facility];  Service: Orthopedics;  Laterality: Right;    Family History  Problem Relation Age of Onset  . Heart disease Brother     x2  . Stroke Brother   . Heart  disease Father   . Asthma Mother     Social History   Social History  . Marital Status: Married    Spouse Name: N/A  . Number of Children: 2  . Years of Education: N/A   Occupational History  . Housewife    Social History Main Topics  . Smoking status: Passive Smoke Exposure - Never Smoker  . Smokeless tobacco: Never Used     Comment: Husband & Father smoked  . Alcohol Use: No  . Drug Use: No  . Sexual Activity: Not Asked   Other Topics Concern  . None   Social History Narrative   Originally from [place]. Has always lived in [place]. Previously has traveled to [place]. No international travel. Enjoys planting gardens and working in her flowers. 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 No bird, mold, or hot tub exposure.       Objective:   Physical Exam BP 122/74 mmHg  Pulse 63  Ht 5\' 4"  (1.626 m)  Wt 154 lb 12.8 oz (70.217 kg)  BMI 26.56 kg/m2  SpO2 96% General:  Awake. Alert. No acute distress. Caucasian female.  Integument:  Warm & dry. No rash on exposed skin. No bruising. HEENT:  Moist mucus membranes. No oral ulcers. No scleral injection or icterus. Moderate bilateral nasal turbinate swelling Cardiovascular:  Regular rate. No edema. No appreciable JVD.  Pulmonary:  Good aeration bilaterally. Very mild end expiratory wheeze. Symmetric chest wall expansion. No accessory muscle use on room air. Abdomen: Soft. Normal bowel sounds. Nondistended. Grossly nontender. Musculoskeletal:  Normal bulk and tone. Hand grip strength 5/5 bilaterally. No joint deformity or effusion appreciated.  PFT 08/26/14: FVC 2.53 L (79%) FEV1 1.98 L (81%) FEV1/FVC 0.78 FEF 25-75 1.73 L (80%) no bronchodilator response TLC 4.24 L (84%) RV 66% ERV 17%  IMAGING CXR PA/LAT 11/09/15 (personally reviewed by me): No focal opacity or effusion appreciated. Heart normal in size. Mediastinum normal in contour with suggestion of hiatal hernia.    Assessment & Plan:  65 year old female with acute  bronchitis. Suspect this was secondary to a viral cause at least initially given her description of symptoms. She does have some very minimal wheezing on exam today and given her previous intolerance to oral prednisone I am trying inhaled steroid therapy briefly. Review of her prior spirometry shows no evidence for airway obstruction or significant bronchodilator response but I do question whether or not she may have some reactive airways disease. She also has significant tobacco exposure through her family which would raise her risk of COPD as well. I believe the hemoptysis was likely secondary to the force of her coughing and without evidence of focal opacity  on x-ray imaging needs no further workup unless this recurs. Her reflux seems to be well-controlled at this time and despite her hiatal hernia I do not feel is contributing to her current cough or symptoms. I instructed the patient contact my office if she had any new breathing problems or clinical worsening before her follow-up appointment.  1. Acute Bronchitis: Finishing course of antibiotics. Prescribed and Tessalon Perles for cough suppression. Patient given sample of Symbicort 80/4.5 with instructions on proper oral hygiene and use. Checking full pulmonary function testing at follow-up appointment. Checking sputum culture for AFB, fungus, and bacteria. 2. Hemoptysis: Patient advised to notify me if this recurs. Consider CT chest without contrast in this event. 3. GERD:  Continuing on Carafate & Protonix. Symptoms well controlled. 4. Health Maintenance:  Received influenza vaccine October 2016, Prevnar October 2016, & Pneumovax August 2015. 5. Follow-up: Patient to return to clinic in 1-2 months or sooner if needed.  Sonia Baller Ashok Cordia, M.D. [facility] Pulmonary & Critical Care Pager:  [phone] After 3pm or if no response, call [phone] 12:51 PM 11/12/2015

## 2015-11-15 ENCOUNTER — Other Ambulatory Visit: Payer: PPO

## 2015-11-15 ENCOUNTER — Other Ambulatory Visit: Payer: Self-pay | Admitting: Pulmonary Disease

## 2015-11-15 DIAGNOSIS — J209 Acute bronchitis, unspecified: Secondary | ICD-10-CM

## 2015-11-18 LAB — RESPIRATORY CULTURE OR RESPIRATORY AND SPUTUM CULTURE

## 2015-11-18 LAB — AFB CULTURE WITH SMEAR (NOT AT ARMC)

## 2015-11-22 ENCOUNTER — Ambulatory Visit
Admission: RE | Admit: 2015-11-22 | Discharge: 2015-11-22 | Disposition: A | Discharge status: Home / Self Care | Payer: PPO | Source: Ambulatory Visit

## 2015-11-22 DIAGNOSIS — Z1231 Encounter for screening mammogram for malignant neoplasm of breast: Secondary | ICD-10-CM

## 2015-12-02 ENCOUNTER — Telehealth: Payer: Self-pay | Admitting: Pulmonary Disease

## 2015-12-02 NOTE — Telephone Encounter (Signed)
Did cough improve on Symbicort?  I'm guessing not. Then yes we should still try to get PFTs on her. Thanks.

## 2015-12-02 NOTE — Telephone Encounter (Signed)
Called spoke with pt. She reports symbicort did not help cough. Aware to keep appt for PFT. Nothing further needed

## 2015-12-02 NOTE — Telephone Encounter (Signed)
Called spoke with pt. She is scheduled for PFT 12/21/15. She wants to know if she still needs to have this done as she is still coughing a lot. She reports still having congestion as well. She wants to make sure she gets an accurate test and not throw away her money d/t her coughing and unable to perform the test. Please advise Dr. Ashok Cordia thanks

## 2015-12-04 ENCOUNTER — Other Ambulatory Visit: Payer: Self-pay | Admitting: Internal Medicine

## 2015-12-06 NOTE — Telephone Encounter (Signed)
Ok to refill Sir? 

## 2015-12-06 NOTE — Telephone Encounter (Signed)
OK to refill x 1 year 

## 2015-12-07 ENCOUNTER — Telehealth: Payer: Self-pay | Admitting: Pulmonary Disease

## 2015-12-07 ENCOUNTER — Ambulatory Visit: Payer: Self-pay | Admitting: Pulmonary Disease

## 2015-12-07 NOTE — Telephone Encounter (Signed)
Spoke with Alwyn Ren at [facility] with a call report on pt's AFB culture: Smear is rare 1+positive AFB seen, isolated AFB identification is still in progress.  Hard copy report being faxed to our office.    JN please advise.  Thanks!

## 2015-12-09 NOTE — Telephone Encounter (Signed)
Will forward to Dr Ashok Cordia  To inform that Latoya [name] was unable to perform Fungus culture and also results of AFB culture.

## 2015-12-09 NOTE — Telephone Encounter (Signed)
Noted. Will await culture result.

## 2015-12-09 NOTE — Telephone Encounter (Signed)
Neoma Laming with [facility], [id number] opt 2, ref [id number] called and states unable to perform the fungus culture.  Specimen was past stabibility.  Orders did not cross in time.  There were two orders and states by the time this one crossed it was too old.

## 2015-12-10 LAB — FUNGUS CULTURE W SMEAR

## 2015-12-20 ENCOUNTER — Telehealth: Payer: Self-pay | Admitting: Internal Medicine

## 2015-12-20 NOTE — Telephone Encounter (Signed)
Patient advised that she does not need to continue carafate.  She is advised to remain on her pantoprazole.  She will call back for any additional questions or concerns.

## 2015-12-21 ENCOUNTER — Telehealth: Payer: Self-pay | Admitting: Pulmonary Disease

## 2015-12-21 DIAGNOSIS — R05 Cough: Secondary | ICD-10-CM

## 2015-12-21 DIAGNOSIS — R059 Cough, unspecified: Secondary | ICD-10-CM

## 2015-12-21 LAB — AFB CULTURE WITH SMEAR (NOT AT ARMC)

## 2015-12-21 NOTE — Telephone Encounter (Signed)
Encounter closed in error and forwarded to [facility]

## 2015-12-21 NOTE — Telephone Encounter (Signed)
Call report from [facility]- AFB smear pos for MAC  Will forward to JN to make him aware

## 2015-12-22 NOTE — Addendum Note (Signed)
Addended by: Mathis Dad on: 12/22/2015 10:05 AM   Modules accepted: Orders

## 2015-12-22 NOTE — Telephone Encounter (Signed)
lmtcb and will hold in triage

## 2015-12-22 NOTE — Telephone Encounter (Signed)
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 We should do a CT Chest without contrast to evaluate for any parenchymal disease. Please call the patient and let her know. Also, I don't see where the patient is scheduled for either a Full PFT or follow-up appointment with me? Thanks.

## 2015-12-22 NOTE — Telephone Encounter (Signed)
Patient states that she fell off ladder and has been "down with her back".  She said that she would call to reschedule her PFT and follow up appointment.  CT Chest without Contrast - Parenchymal Disease - Patient would like to have done at [facility] entered for CT. Nothing further needed.

## 2015-12-24 ENCOUNTER — Ambulatory Visit (INDEPENDENT_AMBULATORY_CARE_PROVIDER_SITE_OTHER)
Admission: RE | Admit: 2015-12-24 | Discharge: 2015-12-24 | Disposition: A | Discharge status: Home / Self Care | Payer: PPO | Source: Ambulatory Visit | Attending: Pulmonary Disease | Admitting: Pulmonary Disease

## 2015-12-24 DIAGNOSIS — E041 Nontoxic single thyroid nodule: Secondary | ICD-10-CM | POA: Insufficient documentation

## 2015-12-24 DIAGNOSIS — R05 Cough: Secondary | ICD-10-CM

## 2015-12-24 DIAGNOSIS — R059 Cough, unspecified: Secondary | ICD-10-CM

## 2015-12-28 ENCOUNTER — Ambulatory Visit: Payer: Self-pay | Admitting: Pulmonary Disease

## 2016-01-13 ENCOUNTER — Telehealth: Payer: Self-pay | Admitting: Pulmonary Disease

## 2016-01-13 NOTE — Progress Notes (Signed)
Quick Note:  Spoke with pt and notified of results per Dr. Ashok Cordia. Pt verbalized understanding and denied any questions.  ______

## 2016-01-13 NOTE — Telephone Encounter (Signed)
Please let the patient know I reviewed her Chest CT scan. 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 It's quit possible it's simply colonizing her airways. She does have a small nodule in her thyroid that needs to be addressed by her PCP. Please make sure she calls them and forward a copy of the report for their review. Thanks.

## 2016-01-13 NOTE — Telephone Encounter (Signed)
Spoke with pt and notified of results per Dr. Ashok Cordia. Pt verbalized understanding and denied any questions. I have faxed report on her CT to Dr Maudie Mercury- PCP and she states she will call him tomorrow and get her ov moved up

## 2016-01-17 ENCOUNTER — Telehealth: Payer: Self-pay | Admitting: Pulmonary Disease

## 2016-01-17 ENCOUNTER — Other Ambulatory Visit: Payer: Self-pay | Admitting: Internal Medicine

## 2016-01-17 DIAGNOSIS — E041 Nontoxic single thyroid nodule: Secondary | ICD-10-CM

## 2016-01-17 NOTE — Telephone Encounter (Signed)
ATC x 1, wcb

## 2016-01-18 NOTE — Telephone Encounter (Signed)
atc pt, no answer, no vm.  Wcb.  

## 2016-01-19 NOTE — Telephone Encounter (Signed)
atc pt X3, no answer, no vm.  Wcb.

## 2016-01-20 NOTE — Telephone Encounter (Signed)
Spoke with pt. She is needing a copy of her most recent CT sent to her PCP. This has been sent. Nothing further was needed.

## 2016-01-21 ENCOUNTER — Ambulatory Visit
Admission: RE | Admit: 2016-01-21 | Discharge: 2016-01-21 | Disposition: A | Discharge status: Home / Self Care | Payer: PPO | Source: Ambulatory Visit | Attending: Internal Medicine | Admitting: Internal Medicine

## 2016-01-21 DIAGNOSIS — E041 Nontoxic single thyroid nodule: Secondary | ICD-10-CM

## 2016-01-24 ENCOUNTER — Ambulatory Visit: Payer: Self-pay | Admitting: Internal Medicine

## 2016-02-22 ENCOUNTER — Ambulatory Visit (INDEPENDENT_AMBULATORY_CARE_PROVIDER_SITE_OTHER): Payer: PPO | Admitting: Podiatry

## 2016-02-22 ENCOUNTER — Encounter: Payer: Self-pay | Admitting: Podiatry

## 2016-02-22 ENCOUNTER — Ambulatory Visit (INDEPENDENT_AMBULATORY_CARE_PROVIDER_SITE_OTHER): Payer: PPO

## 2016-02-22 VITALS — BP 117/66 | HR 65 | Resp 16

## 2016-02-22 DIAGNOSIS — S93422A Sprain of deltoid ligament of left ankle, initial encounter: Secondary | ICD-10-CM | POA: Diagnosis not present

## 2016-02-22 DIAGNOSIS — M7672 Peroneal tendinitis, left leg: Secondary | ICD-10-CM | POA: Diagnosis not present

## 2016-02-22 NOTE — Progress Notes (Signed)
She presents today with a chief complaint of pain to the lateral aspect of the left ankle where a peroneal tendon repair has been performed before. She states that she injured the foot.  Objective: Vital signs are stable and oriented 3. She has pain on palpation of the peroneal tendon left. Appears to be intact without signs of tear. Radius taken today do not demonstrate any type of osseus abnormalities to the ankle.  Assessment: Peroneal tendinitis left.  Plan: I injected the area today with Kenalog and local anesthetic no follow-up with her in the near future.

## 2016-02-23 ENCOUNTER — Ambulatory Visit (INDEPENDENT_AMBULATORY_CARE_PROVIDER_SITE_OTHER): Payer: PPO | Admitting: Pulmonary Disease

## 2016-02-23 DIAGNOSIS — J209 Acute bronchitis, unspecified: Secondary | ICD-10-CM

## 2016-02-23 LAB — PULMONARY FUNCTION TEST
DL/VA % pred: 93 %
DL/VA: 4.5 ml/min/mmHg/L
DLCO cor % pred: 76 %
DLCO cor: 18.62 ml/min/mmHg
DLCO unc % pred: 78 %
DLCO unc: 19.01 ml/min/mmHg
FEF 25-75 Post: 1.86 L/sec
FEF 25-75 Pre: 2.11 L/sec
FEF2575-%Change-Post: -11 %
FEF2575-%Pred-Post: 89 %
FEF2575-%Pred-Pre: 100 %
FEV1-%Change-Post: -4 %
FEV1-%Pred-Post: 84 %
FEV1-%Pred-Pre: 88 %
FEV1-Post: 2.01 L
FEV1-Pre: 2.1 L
FEV1FVC-%Change-Post: 1 %
FEV1FVC-%Pred-Pre: 105 %
FEV6-%Change-Post: -5 %
FEV6-%Pred-Post: 81 %
FEV6-%Pred-Pre: 85 %
FEV6-Post: 2.44 L
FEV6-Pre: 2.58 L
FEV6FVC-%Pred-Post: 104 %
FEV6FVC-%Pred-Pre: 104 %
FVC-%Change-Post: -5 %
FVC-%Pred-Post: 77 %
FVC-%Pred-Pre: 82 %
FVC-Post: 2.44 L
FVC-Pre: 2.58 L
Post FEV1/FVC ratio: 82 %
Post FEV6/FVC ratio: 100 %
Pre FEV1/FVC ratio: 82 %
Pre FEV6/FVC Ratio: 100 %
RV % pred: 95 %
RV: 2 L
TLC % pred: 91 %
TLC: 4.64 L

## 2016-02-23 NOTE — Progress Notes (Signed)
PFT done today. 

## 2016-02-29 ENCOUNTER — Ambulatory Visit (INDEPENDENT_AMBULATORY_CARE_PROVIDER_SITE_OTHER): Payer: PPO | Admitting: Pulmonary Disease

## 2016-02-29 ENCOUNTER — Encounter: Payer: Self-pay | Admitting: Pulmonary Disease

## 2016-02-29 VITALS — BP 130/70 | HR 60 | Ht 64.0 in | Wt 156.0 lb

## 2016-02-29 DIAGNOSIS — E041 Nontoxic single thyroid nodule: Secondary | ICD-10-CM

## 2016-02-29 DIAGNOSIS — K219 Gastro-esophageal reflux disease without esophagitis: Secondary | ICD-10-CM

## 2016-02-29 DIAGNOSIS — Z8719 Personal history of other diseases of the digestive system: Secondary | ICD-10-CM

## 2016-02-29 DIAGNOSIS — R05 Cough: Secondary | ICD-10-CM

## 2016-02-29 DIAGNOSIS — R059 Cough, unspecified: Secondary | ICD-10-CM

## 2016-02-29 NOTE — Patient Instructions (Signed)
   Please call me if you have any new cough or start coughing up blood again.  Remember to follow-up with your primary care provider regarding your thyroid nodule and sweats.  I will see you back in 1 year or sooner if needed.

## 2016-02-29 NOTE — Progress Notes (Signed)
Subjective:    Patient ID: Latoya [name], female    DOB: 03-Aug-1951, 65 y.o.   MRN: [id number]  C.C.:  Follow-up for Cough/Bronchitis, Hemoptysis, & GERD.  HPI Cough/Bronchitis: Reports a mild, intermittent cough that she says is more of a "throat clearing". Reports her cough is nonproductive. No wheezing.  Hemoptysis:  Reports no recent hemoptysis. Cannot recall her last episode.  GERD:  Follows with GI. Has hiatal hernia on CT imaging. No morning brash water taste. No reflux or dyspepsia. Compliant with bid Protonix but does sometimes miss doses of Carafate.   Review of Systems No fever or chills. Does have occasional drenching sweats mostly in the morning. No chest pain or tightness. Rare left-sided chest discomfort that is brief and infrequent. Reports chronic back pain that seems to radiate into her abdomen on th left side as well.  Allergies  Allergen Reactions  . Other     All pain medications: Unknown/ CODEINE  . Prednisone Other (See Comments)    DYSPHORIA  . Vioxx [Rofecoxib]   . Entex T [Pseudoephedrine-Guaifenesin] Palpitations  . Levaquin [Levofloxacin] Rash    Tolerated Avelox without adverse effect 11/08/15.  Marland Kitchen Penicillins Rash  . Sulfa Antibiotics Rash  . Trovan [Alatrofloxacin] Rash    Current Outpatient Prescriptions on File Prior to Visit  Medication Sig Dispense Refill  . Alum & Mag Hydroxide-Simeth (MAGIC MOUTHWASH) SOLN Take 5 mLs by mouth every 6 (six) hours as needed (sore throat).    Marland Kitchen aspirin 81 MG tablet Take 81 mg by mouth at bedtime.     . Biotin 1000 MCG tablet Take 1,000 mcg by mouth 3 (three) times daily.    . Cholecalciferol (VITAMIN D3) 5000 UNITS CAPS Take 1 capsule by mouth daily.    . clonazePAM (KLONOPIN) 1 MG tablet Take 1 mg by mouth 2 (two) times daily as needed for anxiety.   5  . diltiazem (CARDIZEM) 120 MG tablet Take 120 mg by mouth 2 (two) times daily.     . furosemide (LASIX) 20 MG tablet Take 20 mg by mouth as needed.    .  methocarbamol (ROBAXIN) 500 MG tablet Take 1 tablet (500 mg total) by mouth 3 (three) times daily between meals as needed. 20 tablet 0  . pantoprazole (PROTONIX) 40 MG tablet TAKE 1 TABLET(40 MG) BY MOUTH TWICE DAILY 60 tablet 0  . Potassium Chloride ER 20 MEQ TBCR Take by mouth. When takes fluid tablet    . pravastatin (PRAVACHOL) 40 MG tablet TAKE 1 TABLET BY MOUTH EVERY NIGHT AT BEDTIME FOR CHOLESTEROL 90 tablet 1  . Probiotic Product (PROBIOTIC ACIDOPHILUS) CAPS Take 1 capsule by mouth 2 (two) times daily.     . promethazine-codeine (PHENERGAN WITH CODEINE) 6.25-10 MG/5ML syrup Take 5 mLs by mouth every 6 (six) hours as needed for cough.    . sertraline (ZOLOFT) 100 MG tablet Take 100 mg by mouth daily.  3  . sucralfate (CARAFATE) 1 g tablet TAKE 1 TABLET BY MOUTH FOUR TIMES DAILY 360 tablet 3  . vitamin B-12 (CYANOCOBALAMIN) 500 MCG tablet Take 500 mcg by mouth daily.     No current facility-administered medications on file prior to visit.    Past Medical History  Diagnosis Date  . Hypertension   . Hyperlipemia   . Anxiety and depression   . Arthritis   . Colon polyps   . Hemorrhoids   . GERD (gastroesophageal reflux disease)   . Panic disorder   . Kidney stones   .  Stomach ulcer   . Unspecified essential hypertension 11/26/2013  . Mixed hyperlipidemia 11/26/2013  . Renal insufficiency     stage 3 kidney disease per her PCP    Past Surgical History  Procedure Laterality Date  . Abdominal hysterectomy    . Tubal ligation    . Foot surgery  2008    left  . Colonoscopy    . Upper gastrointestinal endoscopy    . Oophorectomy    . Carpometacarpel suspension plasty Right 12/08/2013    Procedure: CARPOMETACARPEL [facility]) SUSPENSION PLASTY;  Surgeon: Jolyn Nap, MD;  Location: [facility];  Service: Orthopedics;  Laterality: Right;    Family History  Problem Relation Age of Onset  . Heart disease Brother     x2  . Stroke Brother   . Heart disease Father   .  Asthma Mother     Social History   Social History  . Marital Status: Married    Spouse Name: N/A  . Number of Children: 2  . Years of Education: N/A   Occupational History  . Housewife    Social History Main Topics  . Smoking status: Passive Smoke Exposure - Never Smoker  . Smokeless tobacco: Never Used     Comment: Husband & Father smoked  . Alcohol Use: No  . Drug Use: No  . Sexual Activity: Not Asked   Other Topics Concern  . None   Social History Narrative   Originally from [place]. Has always lived in [place]. Previously has traveled to [place]. No international travel. Enjoys planting gardens and working in her flowers. 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 No bird, mold, or hot tub exposure.       Objective:   Physical Exam BP 130/70 mmHg  Pulse 60  Ht 5\' 4"  (1.626 m)  Wt 156 lb (70.761 kg)  BMI 26.76 kg/m2  SpO2 98% General:  Awake. Alert. No distress.  Integument:  Warm & dry. No rash on exposed skin. No bruising. HEENT:  Moist mucus membranes. No oral ulcers. No scleral injection. Persistent moderate bilateral nasal turbinate swelling Cardiovascular:  Regular rate. No edema. Normal S1 & has 2.  Pulmonary:  Clear bilaterally to auscultation. Normal work of breathing on room air. Speaking in complete sentences. Musculoskeletal:  Normal bulk and tone. No joint deformity or effusion appreciated.  PFT 02/23/16: FVC 2.58 L (82%) FEV1 2.10 L (88%) FEV1/FVC 0.82 FEF 25-75 2.11 L (100%) no bronchodilator response TLC 4.64 L (91%) RV 95% ERV 10% DLCO corrected 76% (Hgb 14.1) 08/26/14: FVC 2.53 L (79%) FEV1 1.98 L (81%) FEV1/FVC 0.78 FEF 25-75 1.73 L (80%) no bronchodilator response TLC 4.24 L (84%) RV 66% ERV 17%  IMAGING CT CHEST W/O 12/24/15 (personally reviewed by me): 9 millimeter nodule in the thyroid gland. Large hiatal hernia. No parenchymal nodule or opacity. No pleural effusion or thickening. No pathologic mediastinal adenopathy. No pericardial  effusion.  CXR PA/LAT 11/09/15 (previously reviewed by me): No focal opacity or effusion appreciated. Heart normal in size. Mediastinum normal in contour with suggestion of hiatal hernia.    Assessment & Plan:  65 year old female with Previous episode of acute bronchitis and hemoptysis now having resolved. Reviewed patient's pulmonary function testing which showed normal spirometry, lung volumes, and carbonated monoxide diffusion opacity. Also reviewed her chest CT imaging which again showed a thyroid nodule but no other parenchymal source for her hemoptysis which is resolved. Suspect hemoptysis was secondary to acute bronchitis. Patient  continues to remain asymptomatic with regards to her underlying reflux and hiatal hernia. Instructed the patient to contact my office if she developed any new symptoms or recurrence of hemoptysis before her next appointment.  1. Cough/Bronchitis: Resolved. Suspect intermittent cough due to mild sinus drainage. 2. Hemoptysis: Resolved. 3. GERD w/ Hiatal Hernia:  Continuing on Protonix & Carafate. Follow-up with GI. 4. Health Maintenance:  Received influenza vaccine October 2016, Prevnar October 2016, & Pneumovax August 2015. 5. Follow-up: Patient to return to clinic in 1 year or sooner if needed.  Sonia Baller Ashok Cordia, M.D. [facility] Pulmonary & Critical Care Pager:  [phone] After 3pm or if no response, call [phone] 9:16 AM 02/29/2016

## 2016-03-23 ENCOUNTER — Ambulatory Visit: Payer: PPO | Admitting: Podiatry

## 2016-05-27 ENCOUNTER — Other Ambulatory Visit: Payer: Self-pay | Admitting: Internal Medicine

## 2016-06-30 ENCOUNTER — Encounter: Payer: Self-pay | Admitting: Physician Assistant

## 2016-06-30 ENCOUNTER — Ambulatory Visit (INDEPENDENT_AMBULATORY_CARE_PROVIDER_SITE_OTHER): Payer: PPO | Admitting: Physician Assistant

## 2016-06-30 VITALS — BP 132/68 | HR 84 | Temp 97.9°F | Resp 16 | Ht 64.0 in | Wt 153.0 lb

## 2016-06-30 DIAGNOSIS — K219 Gastro-esophageal reflux disease without esophagitis: Secondary | ICD-10-CM | POA: Diagnosis not present

## 2016-06-30 DIAGNOSIS — R7309 Other abnormal glucose: Secondary | ICD-10-CM | POA: Diagnosis not present

## 2016-06-30 DIAGNOSIS — R6889 Other general symptoms and signs: Secondary | ICD-10-CM

## 2016-06-30 DIAGNOSIS — N183 Chronic kidney disease, stage 3 unspecified: Secondary | ICD-10-CM | POA: Insufficient documentation

## 2016-06-30 DIAGNOSIS — Z79899 Other long term (current) drug therapy: Secondary | ICD-10-CM

## 2016-06-30 DIAGNOSIS — I700 Atherosclerosis of aorta: Secondary | ICD-10-CM | POA: Diagnosis not present

## 2016-06-30 DIAGNOSIS — R251 Tremor, unspecified: Secondary | ICD-10-CM | POA: Diagnosis not present

## 2016-06-30 DIAGNOSIS — F411 Generalized anxiety disorder: Secondary | ICD-10-CM | POA: Diagnosis not present

## 2016-06-30 DIAGNOSIS — Z8719 Personal history of other diseases of the digestive system: Secondary | ICD-10-CM | POA: Diagnosis not present

## 2016-06-30 DIAGNOSIS — E559 Vitamin D deficiency, unspecified: Secondary | ICD-10-CM

## 2016-06-30 DIAGNOSIS — E041 Nontoxic single thyroid nodule: Secondary | ICD-10-CM

## 2016-06-30 DIAGNOSIS — I10 Essential (primary) hypertension: Secondary | ICD-10-CM

## 2016-06-30 DIAGNOSIS — E782 Mixed hyperlipidemia: Secondary | ICD-10-CM | POA: Diagnosis not present

## 2016-06-30 DIAGNOSIS — Z0001 Encounter for general adult medical examination with abnormal findings: Secondary | ICD-10-CM

## 2016-06-30 DIAGNOSIS — Z0000 Encounter for general adult medical examination without abnormal findings: Secondary | ICD-10-CM

## 2016-06-30 LAB — CBC WITH DIFFERENTIAL/PLATELET
Basophils Absolute: 0 cells/uL (ref 0–200)
Basophils Relative: 0 %
Eosinophils Absolute: 177 cells/uL (ref 15–500)
Eosinophils Relative: 3 %
HCT: 44.6 % (ref 35.0–45.0)
Hemoglobin: 15 g/dL (ref 11.7–15.5)
Lymphocytes Relative: 25 %
Lymphs Abs: 1475 cells/uL (ref 850–3900)
MCH: 30.2 pg (ref 27.0–33.0)
MCHC: 33.6 g/dL (ref 32.0–36.0)
MCV: 89.7 fL (ref 80.0–100.0)
MPV: 9.7 fL (ref 7.5–12.5)
Monocytes Absolute: 649 cells/uL (ref 200–950)
Monocytes Relative: 11 %
Neutro Abs: 3599 cells/uL (ref 1500–7800)
Neutrophils Relative %: 61 %
Platelets: 204 10*3/uL (ref 140–400)
RBC: 4.97 MIL/uL (ref 3.80–5.10)
RDW: 14.1 % (ref 11.0–15.0)
WBC: 5.9 10*3/uL (ref 3.8–10.8)

## 2016-06-30 LAB — HEPATIC FUNCTION PANEL
ALT: 22 U/L (ref 6–29)
AST: 34 U/L (ref 10–35)
Albumin: 4.3 g/dL (ref 3.6–5.1)
Alkaline Phosphatase: 84 U/L (ref 33–130)
Bilirubin, Direct: 0.1 mg/dL (ref <=0.2)
Indirect Bilirubin: 0.3 mg/dL (ref 0.2–1.2)
Total Bilirubin: 0.4 mg/dL (ref 0.2–1.2)
Total Protein: 7 g/dL (ref 6.1–8.1)

## 2016-06-30 LAB — BASIC METABOLIC PANEL WITH GFR
BUN: 16 mg/dL (ref 7–25)
CO2: 26 mmol/L (ref 20–31)
Calcium: 9.8 mg/dL (ref 8.6–10.4)
Chloride: 106 mmol/L (ref 98–110)
Creat: 1.18 mg/dL — ABNORMAL HIGH (ref 0.50–0.99)
GFR, Est African American: 56 mL/min — ABNORMAL LOW (ref >=60)
GFR, Est Non African American: 48 mL/min — ABNORMAL LOW (ref >=60)
Glucose, Bld: 81 mg/dL (ref 65–99)
Potassium: 3.9 mmol/L (ref 3.5–5.3)
Sodium: 142 mmol/L (ref 135–146)

## 2016-06-30 LAB — IRON AND TIBC
%SAT: 25 % (ref 11–50)
Iron: 107 ug/dL (ref 45–160)
TIBC: 420 ug/dL (ref 250–450)
UIBC: 313 ug/dL (ref 125–400)

## 2016-06-30 LAB — LIPID PANEL
Cholesterol: 213 mg/dL — ABNORMAL HIGH (ref 125–200)
HDL: 60 mg/dL (ref >=46)
LDL Cholesterol: 127 mg/dL (ref <130)
Total CHOL/HDL Ratio: 3.6 Ratio (ref <=5.0)
Triglycerides: 132 mg/dL (ref <150)
VLDL: 26 mg/dL (ref <30)

## 2016-06-30 LAB — TSH: TSH: 2.18 mIU/L

## 2016-06-30 LAB — HEMOGLOBIN A1C
Hgb A1c MFr Bld: 5.6 % (ref <5.7)
Mean Plasma Glucose: 114 mg/dL

## 2016-06-30 LAB — VITAMIN B12: Vitamin B-12: 504 pg/mL (ref 200–1100)

## 2016-06-30 LAB — MAGNESIUM: Magnesium: 2.3 mg/dL (ref 1.5–2.5)

## 2016-06-30 LAB — FERRITIN: Ferritin: 31 ng/mL (ref 20–288)

## 2016-06-30 NOTE — Patient Instructions (Addendum)
take the lasix as needed for swelling Varicose Veins Varicose veins are veins that have become enlarged and twisted. CAUSES This condition is the result of valves in the veins not working properly. Valves in the veins help return blood from the leg to the heart. When your calf muscles squeeze, the blood moves up your leg then the valves close and this continues until the blood gets back to your heart.  If these valves are damaged, blood flows backwards and backs up into the veins in the leg near the skin OR if your are sitting/standing for a long time without using your calf muscles the blood will back up into the veins in your legs. This causes the veins to become larger. People who are on their feet a lot, sit a lot without walking (like on a plane, at a desk, or in a car), who are pregnant, or who are overweight are more likely to develop varicose veins. SYMPTOMS   Bulging, twisted-appearing, bluish veins, most commonly found on the legs.  Leg pain or a feeling of heaviness. These symptoms may be worse at the end of the day.  Leg swelling.  Skin color changes. DIAGNOSIS  Varicose veins can usually be diagnosed with an exam of your legs by your caregiver. He or she may recommend an ultrasound of your leg veins. TREATMENT  Most varicose veins can be treated at home.However, other treatments are available for people who have persistent symptoms or who want to treat the cosmetic appearance of the varicose veins. But this is only cosmetic and they will return if not properly treated. These include:  Laser treatment of very small varicose veins.  Medicine that is shot (injected) into the vein. This medicine hardens the walls of the vein and closes off the vein. This treatment is called sclerotherapy. Afterwards, you may need to wear clothing or bandages that apply pressure.  Surgery. HOME CARE INSTRUCTIONS   Do not stand or sit in one position for long periods of time. Do not sit with your  legs crossed. Rest with your legs raised during the day.  Your legs have to be higher than your heart so that gravity will force the valves to open, so please really elevate your legs.   Wear elastic stockings or support hose. Do not wear other tight, encircling garments around the legs, pelvis, or waist.  ELASTIC THERAPY  has a wide variety of well priced compression stockings. [place], [place] [id number] [id number] [place] ARE COPPER INFUSED COMPRESSION SOCKS AT [facility] OR [facility]  Walk as much as possible to increase blood flow.  Raise the foot of your bed at night with 2-inch blocks.  If you get a cut in the skin over the vein and the vein bleeds, lie down with your leg raised and press on it with a clean cloth until the bleeding stops. Then place a bandage (dressing) on the cut. See your caregiver if it continues to bleed or needs stitches. SEEK MEDICAL CARE IF:   The skin around your ankle starts to break down.  You have pain, redness, tenderness, or hard swelling developing in your leg over a vein.  You are uncomfortable due to leg pain. Document Released: 05/31/2005 Document Revised: 11/13/2011 Document Reviewed: 10/17/2010 [facility] Patient Information 2014 [place].  GETTING OFF OF PPI's  TAKE THE PROTONIX AS NEEDED ONLY    Nexium/protonix/prilosec/Omeprazole/Dexilant/Aciphex are called PPI's, they are great at healing your stomach but should only be  taken for a short period of time.     Recent studies have shown that taken for a long time they  can increase the risk of osteoporosis (weakening of your bones), pneumonia, low magnesium, restless legs, Cdiff (infection that causes diarrhea), DEMENTIA and most recently kidney damage / disease / insufficiency.     Due to this information we want to try to stop the PPI but if you try to stop it abruptly this can cause rebound acid and worsening symptoms.   So this is how we want you to get off the  PPI:  - Start taking the nexium/protonix/prilosec/PPI  every other day with  zantac (ranitidine) 2 x a day for 2-4 weeks - some people stay on this dosage and can not taper off further. Our main goal is to limit the dosage and amount you are taking so if you need to stay on this dose.   - then decrease the PPI to every 3 days while taking the zantac (ranitidine) 300mg  twice a day the other  days for 2-4  Weeks  - then you can try the zantac (ranitidine) 300mg  once at night or up to 2 x day as needed.  - you can continue on this once at night or stop all together  - Avoid alcohol, spicy foods, NSAIDS (aleve, ibuprofen) at this time. See foods below.   +++++++++++++++++++++++++++++++++++++++++++  Food Choices for Gastroesophageal Reflux Disease  When you have gastroesophageal reflux disease (GERD), the foods you eat and your eating habits are very important. Choosing the right foods can help ease the discomfort of GERD. WHAT GENERAL GUIDELINES DO I NEED TO FOLLOW?  Choose fruits, vegetables, whole grains, low-fat dairy products, and low-fat meat, fish, and poultry.  Limit fats such as oils, salad dressings, butter, nuts, and avocado.  Keep a food diary to identify foods that cause symptoms.  Avoid foods that cause reflux. These may be different for different people.  Eat frequent small meals instead of three large meals each day.  Eat your meals slowly, in a relaxed setting.  Limit fried foods.  Cook foods using methods other than frying.  Avoid drinking alcohol.  Avoid drinking large amounts of liquids with your meals.  Avoid bending over or lying down until 2-3 hours after eating.   WHAT FOODS ARE NOT RECOMMENDED? The following are some foods and drinks that may worsen your symptoms:  Vegetables Tomatoes. Tomato juice. Tomato and spaghetti sauce. Chili peppers. Onion and garlic. Horseradish. Fruits Oranges, grapefruit, and lemon (fruit and juice). Meats High-fat  meats, fish, and poultry. This includes hot dogs, ribs, ham, sausage, salami, and bacon. Dairy Whole milk and chocolate milk. Sour cream. Cream. Butter. Ice cream. Cream cheese.  Beverages Coffee and tea, with or without caffeine. Carbonated beverages or energy drinks. Condiments Hot sauce. Barbecue sauce.  Sweets/Desserts Chocolate and cocoa. Donuts. Peppermint and spearmint. Fats and Oils High-fat foods, including [place] fries and potato chips. Other Vinegar. Strong spices, such as black pepper, white pepper, red pepper, cayenne, curry powder, cloves, ginger, and chili powder. Nexium/protonix/prilosec are called PPI's, they are great at healing your stomach but should only be taken for a short period of time.   Tremor A tremor is trembling or shaking that you cannot control. Most tremors affect the hands or arms. Tremors can also affect the head, vocal cords, face, and other parts of the body. There are many types of tremors. Common types include:   Essential tremor. These usually occur in people over  the age of 71. It may run in families and can happen in otherwise healthy people.   Resting tremor. These occur when the muscles are at rest, such as when your hands are resting in your lap. People with Parkinson disease often have resting tremors.   Postural tremor. These occur when you try to hold a pose, such as keeping your hands outstretched.   Kinetic tremor. These occur during purposeful movement, such as trying to touch a finger to your nose.   Task-specific tremor. These may occur when you perform tasks such as handwriting, speaking, or standing.   Psychogenic tremor. These dramatically lessen or disappear when you are distracted. They can happen in people of all ages.  Some types of tremors have no known cause. Tremors can also be a symptom of nervous system problems (neurological disorders) that may occur with aging. Some tremors go away with treatment while others do  not.  HOME CARE INSTRUCTIONS Watch your tremor for any changes. The following actions may help to lessen any discomfort you are feeling:  Take medicines only as directed by your health care provider.   Limit alcohol intake to no more than 1 drink per day for nonpregnant women and 2 drinks per day for men. One drink equals 12 oz of beer, 5 oz of wine, or 1 oz of hard liquor.  Do not use any tobacco products, including cigarettes, chewing tobacco, or electronic cigarettes. If you need help quitting, ask your health care provider.   Avoid extreme heat or cold.   Limit the amount of caffeine you consumeas directed by your health care provider.   Try to get 8 hours of sleep each night.  Find ways to manage your stress, such as meditation or yoga.  Keep all follow-up visits as directed by your health care provider. This is important. SEEK MEDICAL CARE IF:  You start having a tremor after starting a new medicine.  You have tremor with other symptoms such as:  Numbness.  Tingling.  Pain.  Weakness.  Your tremor gets worse.  Your tremor interferes with your day-to-day life.   This information is not intended to replace advice given to you by your health care provider. Make sure you discuss any questions you have with your health care provider.   Document Released: 08/11/2002 Document Revised: 09/11/2014 Document Reviewed: 02/16/2014 Elsevier Interactive Patient Education [employer].

## 2016-06-30 NOTE — Progress Notes (Signed)
MEDICARE ANNUAL WELLNESS VISIT AND CPE  Assessment:   1. Essential hypertension - continue medications, DASH diet, exercise and monitor at home. Call if greater than 130/80.  - CBC with Differential/Platelet - BASIC METABOLIC PANEL WITH GFR - Hepatic function panel  2. Thyroid nodule Repeat US 2017/May - TSH  3. Mixed hyperlipidemia -continue medications, check lipids, decrease fatty foods, increase activity.  - Lipid panel  4. Other abnormal glucose - Hemoglobin A1c  5. Vitamin D deficiency - VITAMIN D 25 Hydroxy (Vit-D Deficiency, Fractures)  6. Generalized anxiety disorder Continue medications  7. Gastroesophageal reflux disease, esophagitis presence not specified Continue PPI/H2 blocker, diet discussed  8. H/O hiatal hernia Continue PPI PRN  9. Medication management - Magnesium  10. Encounter for Medicare annual wellness exam  11. Aortic atherosclerosis (HCC) Control blood pressure, cholesterol, glucose, increase exercise.  - Lipid panel - Hemoglobin A1c  12. CKD (chronic kidney disease) stage 3, GFR 30-59 ml/min Increase fluids, avoid NSAIDS, monitor sugars, will monitor Stop the lasix, only as needed and monitor BP - BASIC METABOLIC PANEL WITH GFR - Iron and TIBC - Ferritin - Vitamin B12  13. Tremor Will refer to neurology since resting tremor without family history, slight cog wheeling/hyperrelexia ? Anxiety, check labs May need to switch CCB to BB - TSH  Over 40 minutes of exam, counseling, chart review and critical decision making was performed Future Appointments Date Time Provider [place]  07/12/2016 8:30 AM Loralie Champagne, PA-C [facility]     Plan:   During the course of the visit the patient was educated and counseled about appropriate screening and preventive services including:    Pneumococcal vaccine   Prevnar 13  Influenza vaccine  Td vaccine  Screening electrocardiogram  Bone densitometry  screening  Colorectal cancer screening  Diabetes screening  Glaucoma screening  Nutrition counseling   Advanced directives: requested   Subjective:  Latoya [name] is a 65 y.o. female who presents for Medicare Annual Wellness Visit and to reestablish as a patient.   She is right handed, has had left hand tremor x few months, worse in the AM, will be at rest. Denies family history of tremor. She can fall asleep very easily, while sitting up/watching TV, husband states she snores but no witness apnea. She does not dream, no postural symptoms, no bradykinesia, no incontinence, no evidence of autonomic dysfunction, no vision changes, no weakness/headaches. She states she does have short term memory issues and states that her depression is well controlled at this time.   Her blood pressure has been controlled at home, today their BP is BP: 132/68  She does workout, walking, yard work, house work. She denies chest pain, shortness of breath, dizziness.  She is on cholesterol medication and denies myalgias. Her cholesterol is not at goal. The cholesterol last visit was:   Lab Results  Component Value Date   CHOL 162 04/06/2014   HDL 46 04/06/2014   LDLCALC 90 04/06/2014   TRIG 129 04/06/2014   CHOLHDL 3.5 04/06/2014    She has been working on diet and exercise for prediabetes, and denies paresthesia of the feet, polydipsia, polyuria and visual disturbances. Last A1C in the office was:  Lab Results  Component Value Date   HGBA1C 5.7 (H) 04/06/2014   Patient is on Vitamin D supplement.   Lab Results  Component Value Date   VD25OH 76 04/06/2014     Last GFR: Lab Results  Component Value Date   GFRNONAA 64 (  L) 07/01/2015  Patient is on Vitamin D supplement.   Lab Results  Component Value Date   VD25OH 76 04/06/2014     BMI is Body mass index is 26.26 kg/m., she is working on diet and exercise. Wt Readings from Last 3 Encounters:  06/30/16 153 lb (69.4 kg)  02/29/16 156 lb  (70.8 kg)  11/12/15 154 lb 12.8 oz (70.2 kg)     Medication Review: Current Outpatient Prescriptions on File Prior to Visit  Medication Sig Dispense Refill  . Alum & Mag Hydroxide-Simeth (MAGIC MOUTHWASH) SOLN Take 5 mLs by mouth every 6 (six) hours as needed (sore throat).    Marland Kitchen aspirin 81 MG tablet Take 81 mg by mouth at bedtime.     . Biotin 1000 MCG tablet Take 1,000 mcg by mouth 3 (three) times daily.    . Cholecalciferol (VITAMIN D3) 5000 UNITS CAPS Take 1 capsule by mouth daily.    . clonazePAM (KLONOPIN) 1 MG tablet Take 1 mg by mouth 2 (two) times daily as needed for anxiety.   5  . diltiazem (CARDIZEM) 120 MG tablet Take 120 mg by mouth 2 (two) times daily.     . furosemide (LASIX) 20 MG tablet Take 20 mg by mouth as needed.    . methocarbamol (ROBAXIN) 500 MG tablet Take 1 tablet (500 mg total) by mouth 3 (three) times daily between meals as needed. 20 tablet 0  . pantoprazole (PROTONIX) 40 MG tablet TAKE 1 TABLET(40 MG) BY MOUTH TWICE DAILY 60 tablet 0  . Potassium Chloride ER 20 MEQ TBCR Take by mouth. When takes fluid tablet    . pravastatin (PRAVACHOL) 40 MG tablet TAKE 1 TABLET BY MOUTH EVERY NIGHT AT BEDTIME FOR CHOLESTEROL 90 tablet 1  . Probiotic Product (PROBIOTIC ACIDOPHILUS) CAPS Take 1 capsule by mouth 2 (two) times daily.     . promethazine-codeine (PHENERGAN WITH CODEINE) 6.25-10 MG/5ML syrup Take 5 mLs by mouth every 6 (six) hours as needed for cough.    . sertraline (ZOLOFT) 100 MG tablet Take 100 mg by mouth daily.  3  . sucralfate (CARAFATE) 1 g tablet TAKE 1 TABLET BY MOUTH FOUR TIMES DAILY 360 tablet 3   No current facility-administered medications on file prior to visit.     Allergies  Allergen Reactions  . Other     All pain medications: Unknown/ CODEINE  . Prednisone Other (See Comments)    DYSPHORIA  . Vioxx [Rofecoxib]   . Entex T [Pseudoephedrine-Guaifenesin] Palpitations  . Levaquin [Levofloxacin] Rash    Tolerated Avelox without adverse effect  11/08/15.  Marland Kitchen Penicillins Rash  . Sulfa Antibiotics Rash  . Trovan [Alatrofloxacin] Rash    Current Problems (verified) Patient Active Problem List   Diagnosis Date Noted  . Encounter for Medicare annual wellness exam 06/30/2016  . Aortic atherosclerosis ([place]) 06/30/2016  . CKD (chronic kidney disease) stage 3, GFR 30-59 ml/min 06/30/2016  . H/O hiatal hernia 02/29/2016  . Thyroid nodule 12/24/2015  . Other abnormal glucose 05/01/2014  . Vitamin D deficiency 05/01/2014  . Medication management 05/01/2014  . Essential hypertension 11/26/2013  . Mixed hyperlipidemia 11/26/2013  . Generalized anxiety disorder 07/11/2013  . Esophageal reflux 11/29/2012    Screening Tests Immunization History  Administered Date(s) Administered  . Influenza Split 06/04/2014, 06/05/2015  . Influenza-Unspecified 06/06/2013  . Pneumococcal Conjugate-13 06/05/2015  . Pneumococcal Polysaccharide-23 04/06/2014  . Pneumococcal-Unspecified 09/04/1996  . Td 09/04/2005   Preventative care: Last colonoscopy: 12/2012 Carlean Purl EGD 12/2012 Last mammogram: 11/2015  Last pap smear/pelvic exam: 2016 normal  DEXA: dec 2014 PFT 02/2016 [place] AB 02/2015 US thyroid 01/2016 DUE 1 year repeat  Prior vaccinations: TD or Tdap: 2007 DUE  Influenza: 2016 DUE  Pneumococcal: 2015 Prevnar13: 2016 Shingles/Zostavax: declines due to cost  Names of Other Physician/Practitioners you currently use: 1. [facility] here for primary care 2. Unsure, eye doctor, last visit Nov 2016, has appt in nov 3. Ladell Pier, dentist, last visit 2017 q 6 months Patient Care Team: Jani Gravel, MD as PCP - General (Internal Medicine) Aquilla Hacker, MD as Referring Physician (Psychiatry) Jannette Spanner, MD as Referring Physician (Dermatology)  SURGICAL HISTORY She  has a past surgical history that includes Abdominal hysterectomy; Tubal ligation; Foot surgery (2008); Colonoscopy; Upper gastrointestinal  endoscopy; Oophorectomy; and Carpometacarpel (CMC) suspension plasty (Right, 12/08/2013). FAMILY HISTORY Her family history includes Asthma in her mother; Heart disease in her brother and father; Stroke in her brother. SOCIAL HISTORY She  reports that she is a non-smoker but has been exposed to tobacco smoke. She has never used smokeless tobacco. She reports that she does not drink alcohol or use drugs.   MEDICARE WELLNESS OBJECTIVES: Physical activity: Current Exercise Habits: Home exercise routine, Type of exercise: treadmill, Time (Minutes): 20, Frequency (Times/Week): 2, Weekly Exercise (Minutes/Week): 40, Intensity: Mild Cardiac risk factors: Cardiac Risk Factors include: advanced age (>62men, >73 women) Depression/mood screen:   Depression screen [facility] 2/9 06/30/2016  Decreased Interest 0  Down, Depressed, Hopeless 0  PHQ - 2 Score 0    ADLs:  In your present state of health, do you have any difficulty performing the following activities: 06/30/2016  Hearing? N  Vision? N  Difficulty concentrating or making decisions? N  Walking or climbing stairs? N  Dressing or bathing? N  Doing errands, shopping? N  Some recent data might be hidden     Cognitive Testing  Alert? Yes  Normal Appearance?Yes  Oriented to person? Yes  Place? Yes   Time? Yes  Recall of three objects?  Yes  Can perform simple calculations? Yes  Displays appropriate judgment?Yes  Can read the correct time from a watch face?Yes  EOL planning: Does patient have an advance directive?: Yes Copy of advanced directive(s) in chart?: No - copy requested  Review of Systems  Constitutional: Negative.   HENT: Negative.   Eyes: Negative.   Respiratory: Negative.   Cardiovascular: Negative.   Gastrointestinal: Negative.   Genitourinary: Negative.   Musculoskeletal: Negative.   Skin: Negative.   Neurological: Positive for tremors. Negative for tingling, sensory change, speech change, focal weakness, seizures, loss of  consciousness and headaches.  Endo/Heme/Allergies: Negative.   Psychiatric/Behavioral: Negative.  Negative for depression and substance abuse. The patient does not have insomnia.     Objective:     Today's Vitals   06/30/16 1044  BP: 132/68  Pulse: 84  Resp: 16  Temp: 97.9 F (36.6 C)  SpO2: 96%  Weight: 153 lb (69.4 kg)  Height: 5\' 4"  (1.626 m)  PainSc: 7   PainLoc: Back   Body mass index is 26.26 kg/m.  General appearance: alert, no distress, WD/WN, female HEENT: normocephalic, sclerae anicteric, TMs pearly, nares patent, no discharge or erythema, pharynx normal Oral cavity: MMM, no lesions Neck: supple, no lymphadenopathy, no thyromegaly, no masses Heart: RRR, normal S1, S2, no murmurs Lungs: CTA bilaterally, no wheezes, rhonchi, or rales Abdomen: +bs, soft, non tender, non distended, no masses, no hepatomegaly, no splenomegaly Musculoskeletal: nontender, no swelling, no  obvious deformity Extremities: no edema, no cyanosis, no clubbing Pulses: 2+ symmetric, upper and lower extremities, normal cap refill Neurological: alert, oriented x 3, CN2-12 intact, strength normal upper extremities and lower extremities, sensation normal throughout, DTRs 2+ throughout with questionable hyperreflexia, no cerebellar signs, slightly abnormal finger to nose, gait normal Psychiatric: normal affect, behavior normal, pleasant   Medicare Attestation I have personally reviewed: The patient's medical and social history Their use of alcohol, tobacco or illicit drugs Their current medications and supplements The patient's functional ability including ADLs,fall risks, home safety risks, cognitive, and hearing and visual impairment Diet and physical activities Evidence for depression or mood disorders  The patient's weight, height, BMI, and visual acuity have been recorded in the chart.  I have made referrals, counseling, and provided education to the patient based on review of the above and I  have provided the patient with a written personalized care plan for preventive services.     [name], PA-C   06/30/2016

## 2016-07-01 LAB — VITAMIN D 25 HYDROXY (VIT D DEFICIENCY, FRACTURES): Vit D, 25-Hydroxy: 61 ng/mL (ref 30–100)

## 2016-07-11 ENCOUNTER — Telehealth: Payer: Self-pay

## 2016-07-11 NOTE — Telephone Encounter (Signed)
Pt had concerns about her CT scan results of her Thyroid.  Provider: its very common to have thyroid nodules, no biopsy until 38mm, hers is 9 mm will monitor thyroid function & repeat thyroid U/S 01/2017/1 yr per recommendations.  Pt was very happy with response & stated that now she will be able to sleep.

## 2016-07-12 ENCOUNTER — Encounter: Payer: Self-pay | Admitting: Gastroenterology

## 2016-07-12 ENCOUNTER — Ambulatory Visit (INDEPENDENT_AMBULATORY_CARE_PROVIDER_SITE_OTHER): Payer: PPO | Admitting: Gastroenterology

## 2016-07-12 VITALS — BP 116/78 | HR 64 | Ht 64.0 in | Wt 150.5 lb

## 2016-07-12 DIAGNOSIS — R1010 Upper abdominal pain, unspecified: Secondary | ICD-10-CM

## 2016-07-12 DIAGNOSIS — K449 Diaphragmatic hernia without obstruction or gangrene: Secondary | ICD-10-CM | POA: Diagnosis not present

## 2016-07-12 MED ORDER — CILIDINIUM-CHLORDIAZEPOXIDE 2.5-5 MG PO CAPS: 1.0000 | ORAL_CAPSULE | Freq: Three times a day (TID) | ORAL | 1 refills | Status: DC | PRN

## 2016-07-12 NOTE — Progress Notes (Signed)
     07/12/2016 Latoya [name] [id number] 1951-02-18   History of Present Illness:  This is a 65 year old female who is known to Dr. Carlean Purl. Her last colonoscopy was in April 2014 at which time she was found to have a diminutive sessile polyp (hyperplastic polyp) that was removed as well as internal and external hemorrhoids.  EGD at that time as well showed abnormal mucosa throughout the entire esophagus; biopsies showed benign mucosa.  She is here today with complaints of upper/epigastric abdominal pain.  Says that the pain can be somewhat intense at times and has kept her from sleep but denies any nausea and vomiting.  She says that she does not sleep well anyway from anxiety and worrying.  She voices concern regarding her hiatal hernia. There is no mention of hiatal hernia on her EGD in April 2014, but more recently on a CT scan of her chest in April 2017 showed a large hiatal hernia. CT scan of the abdomen and pelvis with contrast one year ago also did not comment on any hiatal hernia. She is on protonix 40 mg twice daily for GERD symptoms, which overall works well for her but she still occasionally gets symptoms. She is here today with her husband and they both report that she has a lot of anxiety and worries about everything. They're wondering if her pain could just be related to that.  Recent CBC, BMP, hepatic function panel, TSH, vitamin B12, and iron levels unremarkable from a GI standpoint.  Current Medications, Allergies, Past Medical History, Past Surgical History, Family History and Social History were reviewed in [employer] record.   Physical Exam: BP 116/78   Pulse 64   Ht 5\' 4"  (1.626 m)   Wt 150 lb 8 oz (68.3 kg)   BMI 25.83 kg/m  General: Well developed white female in no acute distress Head: Normocephalic and atraumatic Eyes:  Sclerae anicteric, conjunctiva pink  Ears: Normal auditory acuity Lungs: Clear throughout to auscultation Heart: Regular rate  and rhythm Abdomen: Soft, non-distended.  Normal bowel sounds.  Mild epigastric TTP. Musculoskeletal: Symmetrical with no gross deformities  Extremities: No edema  Neurological: Alert oriented x 4, grossly non-focal Psychological:  Alert and cooperative. Normal mood and affect  Assessment and Recommendations: -65 year old female with epigastric abdominal pain.  It is possible that her symptoms could be IBS related/dyspepsia as she has a lot of anxiety. That being said, she does have a large hiatal hernia on recent CT scan of the chest that appears to be new compared to few years ago. We will perform follow-up of that with an upper GI series. Otherwise we will try Librax 3 times daily as needed in the interim as well.

## 2016-07-12 NOTE — Patient Instructions (Addendum)
We have sent the following medications to your pharmacy for you to pick up at your convenience:  Librax   You have been scheduled for an Upper Gi Series at [facility] (1st floor of hospital) on 07/19/2016 at 11:00am. Please arrive 15 minutes prior to your appointment for registration. Make certain not to have anything to eat or drink after midnight prior to your appointment. Should you need to reschedule your appointment, please contact radiology at [phone]. This test typically takes about 30 minutes to perform.

## 2016-07-14 ENCOUNTER — Encounter: Payer: Self-pay | Admitting: Gastroenterology

## 2016-07-14 DIAGNOSIS — K449 Diaphragmatic hernia without obstruction or gangrene: Secondary | ICD-10-CM | POA: Insufficient documentation

## 2016-07-15 ENCOUNTER — Other Ambulatory Visit: Payer: Self-pay | Admitting: Pulmonary Disease

## 2016-07-17 NOTE — Progress Notes (Signed)
Might have been a small inconsequential hiatal hernia on 2014 EGD in retrospect and may have had one on 2016 CT abd/pelvis - she fell off a ladder then - ? Relation  Agree with UGI series as large hiatal hernia may be source of pain  If confirmed consider surgical repair  Gatha Mayer, MD, [facility]

## 2016-07-19 ENCOUNTER — Ambulatory Visit (HOSPITAL_COMMUNITY): Payer: PPO

## 2016-07-26 ENCOUNTER — Ambulatory Visit (HOSPITAL_COMMUNITY)
Admission: RE | Admit: 2016-07-26 | Discharge: 2016-07-26 | Disposition: A | Discharge status: Home / Self Care | Payer: PPO | Source: Ambulatory Visit | Attending: Gastroenterology | Admitting: Gastroenterology

## 2016-07-26 DIAGNOSIS — K449 Diaphragmatic hernia without obstruction or gangrene: Secondary | ICD-10-CM | POA: Diagnosis not present

## 2016-07-26 DIAGNOSIS — M47812 Spondylosis without myelopathy or radiculopathy, cervical region: Secondary | ICD-10-CM | POA: Insufficient documentation

## 2016-07-26 DIAGNOSIS — K219 Gastro-esophageal reflux disease without esophagitis: Secondary | ICD-10-CM | POA: Diagnosis not present

## 2016-07-26 DIAGNOSIS — R1010 Upper abdominal pain, unspecified: Secondary | ICD-10-CM

## 2016-08-01 ENCOUNTER — Encounter: Payer: Self-pay | Admitting: Internal Medicine

## 2016-08-01 ENCOUNTER — Ambulatory Visit (INDEPENDENT_AMBULATORY_CARE_PROVIDER_SITE_OTHER): Payer: PPO | Admitting: Internal Medicine

## 2016-08-01 VITALS — BP 140/90 | HR 101 | Resp 16 | Ht 64.0 in | Wt 153.0 lb

## 2016-08-01 DIAGNOSIS — J069 Acute upper respiratory infection, unspecified: Secondary | ICD-10-CM | POA: Diagnosis not present

## 2016-08-01 MED ORDER — AZITHROMYCIN 250 MG PO TABS: ORAL_TABLET | ORAL | 0 refills | Status: DC

## 2016-08-01 MED ORDER — FLUTICASONE PROPIONATE 50 MCG/ACT NA SUSP: 2.0000 | Freq: Every day | NASAL | 0 refills | Status: DC

## 2016-08-01 MED ORDER — PROMETHAZINE-DM 6.25-15 MG/5ML PO SYRP: ORAL_SOLUTION | ORAL | 1 refills | Status: DC

## 2016-08-01 MED ORDER — ALBUTEROL SULFATE HFA 108 (90 BASE) MCG/ACT IN AERS: 2.0000 | INHALATION_SPRAY | RESPIRATORY_TRACT | 0 refills | Status: DC | PRN

## 2016-08-01 MED ORDER — BUDESONIDE-FORMOTEROL FUMARATE 160-4.5 MCG/ACT IN AERO: 2.0000 | INHALATION_SPRAY | Freq: Two times a day (BID) | RESPIRATORY_TRACT | 0 refills | Status: DC

## 2016-08-01 NOTE — Progress Notes (Signed)
HPI  Patient presents to the office for evaluation of cough.  It has been going on for 5 days.  Patient reports night > day, wet, worse with lying down.  They also endorse change in voice, postnasal drip and clear drainage from nose, sore throat, headache..  They have tried mucinex.  They report that nothing has worked.  They admits to other sick contacts.  Daughter had very similar symptoms last week.   Review of Systems  Constitutional: Positive for malaise/fatigue. Negative for chills and fever.  HENT: Positive for congestion, ear pain, hearing loss and sore throat.   Respiratory: Positive for cough. Negative for sputum production, shortness of breath and wheezing.   Cardiovascular: Negative for chest pain, palpitations and leg swelling.  Neurological: Positive for headaches.    PE:  Vitals:   08/01/16 1002  BP: 140/90  Pulse: (!) 101  Resp: 16   General:  Alert and non-toxic, WDWN, NAD HEENT: NCAT, PERLA, EOM normal, no occular discharge or erythema.  Nasal mucosal edema with sinus tenderness to palpation.  Oropharynx clear with minimal oropharyngeal edema and erythema.  Mucous membranes moist and pink. Neck:  Cervical adenopathy Chest:  RRR no MRGs.  Lungs clear to auscultation A&P with no wheezes rhonchi or rales.   Abdomen: +BS x 4 quadrants, soft, non-tender, no guarding, rigidity, or rebound. Skin: warm and dry no rash Neuro: A&Ox4, CN II-XII grossly intact  Assessment and Plan:   1. Acute URI -avoid oral steroids due to intolerance - fluticasone (FLONASE) 50 MCG/ACT nasal spray; Place 2 sprays into both nostrils daily.  Dispense: 16 g; Refill: 0 - azithromycin (ZITHROMAX Z-PAK) 250 MG tablet; 2 po day one, then 1 daily x 4 days  Dispense: 6 tablet; Refill: 0 - promethazine-dextromethorphan (PROMETHAZINE-DM) 6.25-15 MG/5ML syrup; Take 5-10 ML PO q8hrs prn for cough  Dispense: 240 mL; Refill: 1 - albuterol (PROVENTIL HFA;VENTOLIN HFA) 108 (90 Base) MCG/ACT inhaler; Inhale  2 puffs into the lungs every 2 (two) hours as needed for wheezing or shortness of breath (cough).  Dispense: 1 Inhaler; Refill: 0 - budesonide-formoterol (SYMBICORT) 160-4.5 MCG/ACT inhaler; Inhale 2 puffs into the lungs 2 (two) times daily.  Dispense: 1 Inhaler; Refill: 0

## 2016-08-07 ENCOUNTER — Other Ambulatory Visit: Payer: Self-pay | Admitting: Internal Medicine

## 2016-08-07 MED ORDER — DOXYCYCLINE HYCLATE 100 MG PO CAPS: 100.0000 mg | ORAL_CAPSULE | Freq: Two times a day (BID) | ORAL | 0 refills | Status: DC

## 2016-08-07 MED ORDER — AZELASTINE HCL 0.1 % NA SOLN: 2.0000 | Freq: Two times a day (BID) | NASAL | 2 refills | Status: DC

## 2016-08-07 MED ORDER — GUAIFENESIN ER 600 MG PO TB12: 600.0000 mg | ORAL_TABLET | Freq: Two times a day (BID) | ORAL | 2 refills | Status: DC

## 2016-08-07 NOTE — Progress Notes (Signed)
Patient called with continued hoarseness and thick mucous.  She is steroid intolerant and cannot take this.  We can add in guanfenison and try a round of doxycycline, but suspect that this is likely viral in nature.  Will have to ride out the course of the illness.

## 2016-08-08 ENCOUNTER — Other Ambulatory Visit: Payer: Self-pay | Admitting: Internal Medicine

## 2016-08-16 ENCOUNTER — Ambulatory Visit (INDEPENDENT_AMBULATORY_CARE_PROVIDER_SITE_OTHER): Payer: PPO | Admitting: Physician Assistant

## 2016-08-16 ENCOUNTER — Encounter: Payer: Self-pay | Admitting: Physician Assistant

## 2016-08-16 VITALS — BP 140/80 | HR 75 | Temp 97.7°F | Resp 16 | Ht 64.0 in | Wt 157.0 lb

## 2016-08-16 DIAGNOSIS — J441 Chronic obstructive pulmonary disease with (acute) exacerbation: Secondary | ICD-10-CM

## 2016-08-16 MED ORDER — DEXAMETHASONE 0.5 MG PO TABS: ORAL_TABLET | ORAL | 0 refills | Status: DC

## 2016-08-16 MED ORDER — AMOXICILLIN-POT CLAVULANATE 875-125 MG PO TABS: 1.0000 | ORAL_TABLET | Freq: Two times a day (BID) | ORAL | 0 refills | Status: DC

## 2016-08-16 NOTE — Patient Instructions (Signed)
Make sure you are on an allergy pill, see below for more details. Please take the prednisone as directed below, this is NOT an antibiotic so you do NOT have to finish it. You can take it for a few days and stop it if you are doing better.   Please take the decadron to help decrease inflammation and therefore decrease symptoms. Take it it with food to avoid GI upset. It can cause increased energy but on the other hand it can make it hard to sleep at night so please take it AT [place], it takes 8-12 hours to start working so it will NOT affect your sleeping if you take it at night with your food!!  If you are diabetic it will increase your sugars so decrease carbs and monitor your sugars closely.     Please pick one of the over the counter allergy medications below and take it once daily for allergies.  Claritin or loratadine cheapest but likely the weakest  Zyrtec or certizine at night because it can make you sleepy The strongest is allegra or fexafinadine  Cheapest at [employer], [employer], [employer]   Community-Acquired Pneumonia, Adult Pneumonia is an infection of the lungs. There are different types of pneumonia. One type can develop while a person is in a hospital. A different type, called community-acquired pneumonia, develops in people who are not, or have not recently been, in the hospital or other health care facility. What are the causes? Pneumonia may be caused by bacteria, viruses, or funguses. Community-acquired pneumonia is often caused by Streptococcus pneumonia bacteria. These bacteria are often passed from one person to another by breathing in droplets from the cough or sneeze of an infected person. What increases the risk? The condition is more likely to develop in:  People who havechronic diseases, such as chronic obstructive pulmonary disease (COPD), asthma, congestive heart failure, cystic fibrosis, diabetes, or kidney disease.  People who haveearly-stage or late-stage  HIV.  People who havesickle cell disease.  People who havehad their spleen removed (splenectomy).  People who havepoor Human resources officer.  People who havemedical conditions that increase the risk of breathing in (aspirating) secretions their own mouth and nose.  People who havea weakened immune system (immunocompromised).  People who smoke.  People whotravel to areas where pneumonia-causing germs commonly exist.  People whoare around animal habitats or animals that have pneumonia-causing germs, including birds, bats, rabbits, cats, and farm animals. What are the signs or symptoms? Symptoms of this condition include:  Adry cough.  A wet (productive) cough.  Fever.  Sweating.  Chest pain, especially when breathing deeply or coughing.  Rapid breathing or difficulty breathing.  Shortness of breath.  Shaking chills.  Fatigue.  Muscle aches. How is this diagnosed? Your health care provider will take a medical history and perform a physical exam. You may also have other tests, including:  Imaging studies of your chest, including X-rays.  Tests to check your blood oxygen level and other blood gases.  Other tests on blood, mucus (sputum), fluid around your lungs (pleural fluid), and urine. If your pneumonia is severe, other tests may be done to identify the specific cause of your illness. How is this treated? The type of treatment that you receive depends on many factors, such as the cause of your pneumonia, the medicines you take, and other medical conditions that you have. For most adults, treatment and recovery from pneumonia may occur at home. In some cases, treatment must happen in a hospital. Treatment  may include:  Antibiotic medicines, if the pneumonia was caused by bacteria.  Antiviral medicines, if the pneumonia was caused by a virus.  Medicines that are given by mouth or through an IV tube.  Oxygen.  Respiratory therapy. Although rare, treating  severe pneumonia may include:  Mechanical ventilation. This is done if you are not breathing well on your own and you cannot maintain a safe blood oxygen level.  Thoracentesis. This procedureremoves fluid around one lung or both lungs to help you breathe better. Follow these instructions at home:  Take over-the-counter and prescription medicines only as told by your health care provider.  Only takecough medicine if you are losing sleep. Understand that cough medicine can prevent your body's natural ability to remove mucus from your lungs.  If you were prescribed an antibiotic medicine, take it as told by your health care provider. Do not stop taking the antibiotic even if you start to feel better.  Sleep in a semi-upright position at night. Try sleeping in a reclining chair, or place a few pillows under your head.  Do not use tobacco products, including cigarettes, chewing tobacco, and e-cigarettes. If you need help quitting, ask your health care provider.  Drink enough water to keep your urine clear or pale yellow. This will help to thin out mucus secretions in your lungs. How is this prevented? There are ways that you can decrease your risk of developing community-acquired pneumonia. Consider getting a pneumococcal vaccine if:  You are older than 66 years of age.  You are older than 66 years of age and are undergoing cancer treatment, have chronic lung disease, or have other medical conditions that affect your immune system. Ask your health care provider if this applies to you. There are different types and schedules of pneumococcal vaccines. Ask your health care provider which vaccination option is best for you. You may also prevent community-acquired pneumonia if you take these actions:  Get an influenza vaccine every year. Ask your health care provider which type of influenza vaccine is best for you.  Go to the dentist on a regular basis.  Wash your hands often. Use hand  sanitizer if soap and water are not available. Contact a health care provider if:  You have a fever.  You are losing sleep because you cannot control your cough with cough medicine. Get help right away if:  You have worsening shortness of breath.  You have increased chest pain.  Your sickness becomes worse, especially if you are an older adult or have a weakened immune system.  You cough up blood. This information is not intended to replace advice given to you by your health care provider. Make sure you discuss any questions you have with your health care provider. Document Released: 08/21/2005 Document Revised: 12/30/2015 Document Reviewed: 12/16/2014 Elsevier Interactive Patient Education  2017 [employer].

## 2016-08-16 NOTE — Progress Notes (Signed)
Subjective:    Patient ID: Latoya [name], female    DOB: 1951-05-30, 65 y.o.   MRN: [id number]  HPI 65 y.o. WF presents with 2 week hoarseness, ST, productive cough, ear pain, some SOB, no wheezing, CP no fever or chills.  Has been on nose spray, mucinex, and OTC cough not helping, not on allergy pill.   Blood pressure 140/80, pulse 75, temperature 97.7 F (36.5 C), resp. rate 16, height 5\' 4"  (1.626 m), weight 157 lb (71.2 kg), SpO2 96 %.  Medications Current Outpatient Prescriptions on File Prior to Visit  Medication Sig  . albuterol (PROVENTIL HFA;VENTOLIN HFA) 108 (90 Base) MCG/ACT inhaler Inhale 2 puffs into the lungs every 2 (two) hours as needed for wheezing or shortness of breath (cough).  . Alum & Mag Hydroxide-Simeth (MAGIC MOUTHWASH) SOLN Take 5 mLs by mouth every 6 (six) hours as needed (sore throat).  Marland Kitchen aspirin 81 MG tablet Take 81 mg by mouth at bedtime.   Marland Kitchen azelastine (ASTELIN) 0.1 % nasal spray Place 2 sprays into both nostrils 2 (two) times daily. Use in each nostril as directed  . Biotin 1000 MCG tablet Take 1,000 mcg by mouth 3 (three) times daily.  . budesonide-formoterol (SYMBICORT) 160-4.5 MCG/ACT inhaler Inhale 2 puffs into the lungs 2 (two) times daily.  . Cholecalciferol (VITAMIN D3) 5000 UNITS CAPS Take 1 capsule by mouth daily.  . clidinium-chlordiazePOXIDE (LIBRAX) 5-2.5 MG capsule Take 1 capsule by mouth every 8 (eight) hours as needed.  . clonazePAM (KLONOPIN) 1 MG tablet Take 1 mg by mouth 2 (two) times daily as needed for anxiety.   Marland Kitchen diltiazem (CARDIZEM) 120 MG tablet Take 120 mg by mouth 2 (two) times daily.   . fluticasone (FLONASE) 50 MCG/ACT nasal spray Place 2 sprays into both nostrils daily.  . furosemide (LASIX) 20 MG tablet Take 20 mg by mouth as needed.  Marland Kitchen guaiFENesin (MUCINEX) 600 MG 12 hr tablet Take 1 tablet (600 mg total) by mouth 2 (two) times daily.  . methocarbamol (ROBAXIN) 500 MG tablet Take 1 tablet (500 mg total) by mouth 3 (three)  times daily between meals as needed.  . pantoprazole (PROTONIX) 40 MG tablet TAKE 1 TABLET BY MOUTH TWICE DAILY  . Potassium Chloride ER 20 MEQ TBCR Take by mouth. When takes fluid tablet  . pravastatin (PRAVACHOL) 40 MG tablet TAKE 1 TABLET BY MOUTH EVERY NIGHT AT BEDTIME FOR CHOLESTEROL  . Probiotic Product (PROBIOTIC ACIDOPHILUS) CAPS Take 1 capsule by mouth 2 (two) times daily.   . promethazine-dextromethorphan (PROMETHAZINE-DM) 6.25-15 MG/5ML syrup Take 5-10 ML PO q8hrs prn for cough  . sertraline (ZOLOFT) 100 MG tablet Take 100 mg by mouth daily.  . sucralfate (CARAFATE) 1 g tablet TAKE 1 TABLET BY MOUTH FOUR TIMES DAILY   No current facility-administered medications on file prior to visit.     Problem list She has Esophageal reflux; Pain of upper abdomen; Generalized anxiety disorder; Essential hypertension; Mixed hyperlipidemia; Other abnormal glucose; Vitamin D deficiency; Medication management; H/O hiatal hernia; Thyroid nodule; Encounter for Medicare annual wellness exam; Aortic atherosclerosis ([place]); CKD (chronic kidney disease) stage 3, GFR 30-59 ml/min; and Hiatal hernia on her problem list.   Review of Systems  Constitutional: Negative for chills and diaphoresis.  HENT: Positive for congestion, postnasal drip, sinus pressure and sneezing. Negative for ear pain and sore throat.   Respiratory: Positive for cough, shortness of breath and wheezing. Negative for apnea, chest tightness and stridor.   Cardiovascular: Negative.  Negative  for chest pain, palpitations and leg swelling.  Gastrointestinal: Negative.   Genitourinary: Negative.   Musculoskeletal: Negative for neck pain.  Neurological: Positive for headaches.       Objective:   Physical Exam  Constitutional: She is oriented to person, place, and time. She appears well-developed and well-nourished.  HENT:  Head: Normocephalic and atraumatic.  Right Ear: External ear normal.  Left Ear: External ear normal.  Nose:  Nose normal.  Mouth/Throat: Oropharynx is clear and moist.  Eyes: Conjunctivae are normal. Pupils are equal, round, and reactive to light.  Neck: Normal range of motion. Neck supple.  Cardiovascular: Normal rate and regular rhythm.   Pulmonary/Chest: Effort normal. No respiratory distress. She has wheezes (worse right lower lobe). She has no rales. She exhibits no tenderness.  Abdominal: Soft. Bowel sounds are normal.  Lymphadenopathy:    She has no cervical adenopathy.  Neurological: She is alert and oriented to person, place, and time.  Skin: Skin is warm and dry.      Assessment & Plan:  1. COPD exacerbation (HCC) - amoxicillin-clavulanate (AUGMENTIN) 875-125 MG tablet; Take 1 tablet by mouth 2 (two) times daily. 7 days  Dispense: 14 tablet; Refill: 0 - dexamethasone (DECADRON) 0.5 MG tablet; 1 tablet PO for 5 days.  Dispense: 5 tablet; Refill: 0   The patient voices understanding of current treatment options and is in agreement with the current care plan.The patient knows to call the clinic with any problems, questions or concerns or go to the ER if any further progression of symptoms.    Future Appointments Date Time Provider [place]  10/10/2016 8:45 AM [name], PA-C [facility] None

## 2016-08-17 NOTE — Progress Notes (Signed)
UGI reviewed - hopefully Latoya [name] is helping  Hiatal hernia could be part of problem but no absolute need for surgical referral  Suggest return to see me February

## 2016-08-21 ENCOUNTER — Other Ambulatory Visit: Payer: Self-pay | Admitting: Physician Assistant

## 2016-08-21 DIAGNOSIS — R059 Cough, unspecified: Secondary | ICD-10-CM

## 2016-08-21 DIAGNOSIS — R05 Cough: Secondary | ICD-10-CM

## 2016-08-22 ENCOUNTER — Other Ambulatory Visit: Payer: PPO

## 2016-08-22 ENCOUNTER — Ambulatory Visit (INDEPENDENT_AMBULATORY_CARE_PROVIDER_SITE_OTHER): Payer: PPO | Admitting: Pulmonary Disease

## 2016-08-22 ENCOUNTER — Encounter: Payer: Self-pay | Admitting: Pulmonary Disease

## 2016-08-22 ENCOUNTER — Ambulatory Visit (INDEPENDENT_AMBULATORY_CARE_PROVIDER_SITE_OTHER)
Admission: RE | Admit: 2016-08-22 | Discharge: 2016-08-22 | Disposition: A | Discharge status: Home / Self Care | Payer: PPO | Source: Ambulatory Visit | Attending: Pulmonary Disease | Admitting: Pulmonary Disease

## 2016-08-22 VITALS — BP 136/76 | HR 79 | Ht 67.0 in | Wt 158.2 lb

## 2016-08-22 DIAGNOSIS — J209 Acute bronchitis, unspecified: Secondary | ICD-10-CM | POA: Insufficient documentation

## 2016-08-22 DIAGNOSIS — K219 Gastro-esophageal reflux disease without esophagitis: Secondary | ICD-10-CM | POA: Diagnosis not present

## 2016-08-22 NOTE — Progress Notes (Signed)
Subjective:    Patient ID: Latoya [name], female    DOB: 09/15/1950, 65 y.o.   MRN: [id number]  C.C.:  Acute visit for Cough with known GERD.  HPI Cough:  She reports she started with a sore throat 3 weeks ago. She then noticed she had a hoarse voice and chest congestion. She reports she has been treated with 2 rounds of antibiotics as well as cough medication without relief. Sick contacts through her daughter with similar symptoms. She reports she is producing a clear or "thick, yellow" mucus. Denies any hemoptysis. She reports she was on steroids at a "low dose" for 5 days. She also was using Breo at the same time.    GERD:  Follows with GI. Has hiatal hernia on CT imaging. Continuing to use her Protonix & Carafate. No reflux, dyspepsia, or morning brash water taste. She is avoiding spicy foods.   Review of Systems She reports her routine sweats. No fever or chills. She reports some tightness in her chest but no frank pain.   Allergies  Allergen Reactions  . Other     All pain medications: Unknown/ CODEINE  . Prednisone Other (See Comments)    DYSPHORIA  . Vioxx [Rofecoxib]   . Entex T [Pseudoephedrine-Guaifenesin] Palpitations  . Levaquin [Levofloxacin] Rash    Tolerated Avelox without adverse effect 11/08/15.  Marland Kitchen Penicillins Rash  . Sulfa Antibiotics Rash  . Trovan [Alatrofloxacin] Rash    Current Outpatient Prescriptions on File Prior to Visit  Medication Sig Dispense Refill  . albuterol (PROVENTIL HFA;VENTOLIN HFA) 108 (90 Base) MCG/ACT inhaler Inhale 2 puffs into the lungs every 2 (two) hours as needed for wheezing or shortness of breath (cough). 1 Inhaler 0  . Alum & Mag Hydroxide-Simeth (MAGIC MOUTHWASH) SOLN Take 5 mLs by mouth every 6 (six) hours as needed (sore throat).    Marland Kitchen amoxicillin-clavulanate (AUGMENTIN) 875-125 MG tablet Take 1 tablet by mouth 2 (two) times daily. 7 days 14 tablet 0  . aspirin 81 MG tablet Take 81 mg by mouth at bedtime.     Marland Kitchen azelastine (ASTELIN)  0.1 % nasal spray Place 2 sprays into both nostrils 2 (two) times daily. Use in each nostril as directed 30 mL 2  . Biotin 1000 MCG tablet Take 1,000 mcg by mouth 3 (three) times daily.    . budesonide-formoterol (SYMBICORT) 160-4.5 MCG/ACT inhaler Inhale 2 puffs into the lungs 2 (two) times daily. 1 Inhaler 0  . Cholecalciferol (VITAMIN D3) 5000 UNITS CAPS Take 1 capsule by mouth daily.    . clidinium-chlordiazePOXIDE (LIBRAX) 5-2.5 MG capsule Take 1 capsule by mouth every 8 (eight) hours as needed. 30 capsule 1  . clonazePAM (KLONOPIN) 1 MG tablet Take 1 mg by mouth 2 (two) times daily as needed for anxiety.   5  . dexamethasone (DECADRON) 0.5 MG tablet 1 tablet PO for 5 days. 5 tablet 0  . diltiazem (CARDIZEM) 120 MG tablet Take 120 mg by mouth 2 (two) times daily.     . fluticasone (FLONASE) 50 MCG/ACT nasal spray Place 2 sprays into both nostrils daily. 16 g 0  . furosemide (LASIX) 20 MG tablet Take 20 mg by mouth as needed.    Marland Kitchen guaiFENesin (MUCINEX) 600 MG 12 hr tablet Take 1 tablet (600 mg total) by mouth 2 (two) times daily. 60 tablet 2  . methocarbamol (ROBAXIN) 500 MG tablet Take 1 tablet (500 mg total) by mouth 3 (three) times daily between meals as needed. 20 tablet 0  .  pantoprazole (PROTONIX) 40 MG tablet TAKE 1 TABLET BY MOUTH TWICE DAILY 60 tablet 2  . Potassium Chloride ER 20 MEQ TBCR Take by mouth. When takes fluid tablet    . pravastatin (PRAVACHOL) 40 MG tablet TAKE 1 TABLET BY MOUTH EVERY NIGHT AT BEDTIME FOR CHOLESTEROL 90 tablet 1  . Probiotic Product (PROBIOTIC ACIDOPHILUS) CAPS Take 1 capsule by mouth 2 (two) times daily.     . promethazine-dextromethorphan (PROMETHAZINE-DM) 6.25-15 MG/5ML syrup Take 5-10 ML PO q8hrs prn for cough 240 mL 1  . sertraline (ZOLOFT) 100 MG tablet Take 100 mg by mouth daily.  3  . sucralfate (CARAFATE) 1 g tablet TAKE 1 TABLET BY MOUTH FOUR TIMES DAILY 360 tablet 3   No current facility-administered medications on file prior to visit.      Past Medical History:  Diagnosis Date  . Anxiety and depression   . Arthritis   . Colon polyps   . GERD (gastroesophageal reflux disease)   . Hemorrhoids   . Hyperlipemia   . Hypertension   . Kidney stones   . Mixed hyperlipidemia 11/26/2013  . Panic disorder   . Renal insufficiency    stage 3 kidney disease per her PCP  . Stomach ulcer   . Unspecified essential hypertension 11/26/2013    Past Surgical History:  Procedure Laterality Date  . ABDOMINAL HYSTERECTOMY    . CARPOMETACARPEL SUSPENSION PLASTY Right 12/08/2013   Procedure: CARPOMETACARPEL [facility]) SUSPENSION PLASTY;  Surgeon: Jolyn Nap, MD;  Location: [facility];  Service: Orthopedics;  Laterality: Right;  . COLONOSCOPY    . FOOT SURGERY  2008   left  . OOPHORECTOMY    . TUBAL LIGATION    . UPPER GASTROINTESTINAL ENDOSCOPY      Family History  Problem Relation Age of Onset  . Heart disease Brother     x2  . Stroke Brother   . Heart disease Father   . Asthma Mother   . Colon cancer Neg Hx   . Stomach cancer Neg Hx     Social History   Social History  . Marital status: Married    Spouse name: N/A  . Number of children: 2  . Years of education: N/A   Occupational History  . Housewife    Social History Main Topics  . Smoking status: Passive Smoke Exposure - Never Smoker  . Smokeless tobacco: Never Used     Comment: Husband & Father smoked  . Alcohol use No  . Drug use: No  . Sexual activity: Yes    Partners: Male   Other Topics Concern  . None   Social History Narrative   Originally from [place]. Has always lived in [place]. Previously has traveled to [place]. No international travel. Enjoys planting gardens and working in her flowers. 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 No bird, mold, or hot tub exposure.       Objective:   Physical Exam BP 136/76 (BP Location: Left Arm, Cuff Size: Normal)   Pulse 79   Ht 5\' 7"  (1.702 m)   Wt 158 lb 3.2 oz (71.8 kg)   SpO2  95%   BMI 24.78 kg/m  General:  Awake. Comfortable. Husband with her today. Integument:  Warm & dry. No rash on exposed skin.  HEENT:  Moist mucus membranes. Mild bilateral nasal turbinate swelling. No oral ulcers. Cardiovascular:  Regular rate. No edema. Normal S1 & S2.  Pulmonary:  Good aeration bilaterally. Clear with auscultation. Speaking  in complete sentences. Musculoskeletal:  Normal bulk and tone. No joint deformity or effusion appreciated.  PFT 02/23/16: FVC 2.58 L (82%) FEV1 2.10 L (88%) FEV1/FVC 0.82 FEF 25-75 2.11 L (100%) no bronchodilator response TLC 4.64 L (91%) RV 95% ERV 10% DLCO corrected 76% (Hgb 14.1) 08/26/14: FVC 2.53 L (79%) FEV1 1.98 L (81%) FEV1/FVC 0.78 FEF 25-75 1.73 L (80%) no bronchodilator response TLC 4.24 L (84%) RV 66% ERV 17%  IMAGING CT CHEST W/O 12/24/15 (previously reviewed by me): 9 millimeter nodule in the thyroid gland. Large hiatal hernia. No parenchymal nodule or opacity. No pleural effusion or thickening. No pathologic mediastinal adenopathy. No pericardial effusion.  CXR PA/LAT 11/09/15 (previously reviewed by me): No focal opacity or effusion appreciated. Heart normal in size. Mediastinum normal in contour with suggestion of hiatal hernia.    Assessment & Plan:  65 y.o. female with acute bronchitis and underlying GERD. Reflux seems to be well-controlled at this time. Believe the patient has resolving acute bronchitis/post viral cough. Given her sputum change and feels that evaluation for a possible underlying infectious organism that may be colonizing would be reasonable. Also given the duration of her symptoms chest imaging is reasonable. She has no findings on physical exam that would suggest pneumonia at this time. I instructed the patient contact my office if she had no symptomatic improvement.  1. Acute Bronchitis:  Symptomatic treatment with [facility] previously given to her. Checking chest x-ray PA/LAT today. Checking sputum culture for AFB, fungus,  and bacteria. Continuing Mucinex twice daily with plenty of liquids as mucolytic. 2. GERD w/ Hiatal Hernia:  Well controlled. Continuing on Protonix & Carafate. Following with GI. 3. Health Maintenance:  S/P Influenza Vaccine October 2017, Pneumovax August 2015, & Prevnar October 2016. 4. Follow-up: Patient to return to clinic in 8 weeks or sooner if needed.  Sonia Baller Ashok Cordia, M.D. [facility] Pager:  [phone] After 3pm or if no response, call [phone] 6:32 PM 08/22/16

## 2016-08-22 NOTE — Patient Instructions (Signed)
   Call me if your cough doesn't start to get better.  Keep taking Mucinex 600mg  twice daily with plenty of water.  Try starting back that Breo inhaler. Remember to rinse, gargle, and spit afterward to keep form getting thrush.  I will see you back in 8 weeks or sooner if needed.  TESTS ORDERED: 1. CXR PA/LAT 2. Sputum culture AFB, Fungus, & Bacteria

## 2016-08-25 LAB — RESPIRATORY CULTURE OR RESPIRATORY AND SPUTUM CULTURE: Organism ID, Bacteria: NORMAL

## 2016-08-25 LAB — AFB CULTURE WITH SMEAR (NOT AT ARMC)

## 2016-09-06 ENCOUNTER — Other Ambulatory Visit: Payer: Self-pay | Admitting: Internal Medicine

## 2016-09-11 ENCOUNTER — Encounter: Payer: Self-pay | Admitting: Internal Medicine

## 2016-09-11 ENCOUNTER — Ambulatory Visit (INDEPENDENT_AMBULATORY_CARE_PROVIDER_SITE_OTHER): Payer: PPO | Admitting: Internal Medicine

## 2016-09-11 VITALS — BP 130/82 | HR 72 | Temp 98.2°F | Resp 18 | Ht 67.0 in | Wt 156.0 lb

## 2016-09-11 DIAGNOSIS — R350 Frequency of micturition: Secondary | ICD-10-CM

## 2016-09-11 NOTE — Patient Instructions (Signed)
Please restart the flonase two sprays per nostril right before bedtime after you clean out your nose with saline.  Please restart taking the claritin, zyrtec, or allegra daily to help dry up congestion.  Please take the myrbetriq once daily to see if we can help stop the nighttime urinary frequency.

## 2016-09-11 NOTE — Progress Notes (Signed)
Subjective:    Patient ID: Latoya [name], female    DOB: 06-Jan-1951, 66 y.o.   MRN: [id number]  HPI  Patient presents to the office for evaluation of urinary frequency and urgency which has been worsening for the last week.  She notes that she drinks a lot of water during the daytime.  She reports that she has more nighttime evaluation.  She is not having any pain when she urinates.  She reports that she is having some left flank pain.  She reports that has been hurting for the last week.  She has not had any blood in her urine.  She has not had any nausea or vomiting.  She is not having any suprapubic pain.  No fevers or chills.  She reports that she does occasionally have intermittent sweating.  She denies caffeine use and denies alcohol use.  She reports that she is getting up 4-5 times per night for urinating.  She is taking the fluid pill only as needed for her leg swelling.  She generally takes that whenever she notices it.    She has had kidney stones in the past.  She reports that she has not had one in several years.  She has had some pain in her flank.  She reports that she has had issues with chronic back pain.    Review of Systems  Constitutional: Negative for chills, fatigue and fever.  Respiratory: Negative for chest tightness and shortness of breath.   Gastrointestinal: Negative for abdominal pain, constipation, diarrhea, nausea and vomiting.  Genitourinary: Positive for flank pain, frequency and urgency. Negative for dysuria, hematuria, pelvic pain, vaginal bleeding, vaginal discharge and vaginal pain.  Musculoskeletal: Positive for back pain.       Objective:   Physical Exam  Constitutional: She is oriented to person, place, and time. She appears well-developed and well-nourished. No distress.  HENT:  Head: Normocephalic.  Mouth/Throat: Oropharynx is clear and moist. No oropharyngeal exudate.  Eyes: Conjunctivae are normal. No scleral icterus.  Neck: Normal range of  motion. Neck supple. No JVD present. No thyromegaly present.  Cardiovascular: Normal rate, regular rhythm, normal heart sounds and intact distal pulses.  Exam reveals no gallop and no friction rub.   No murmur heard. Pulmonary/Chest: Effort normal and breath sounds normal. No respiratory distress. She has no wheezes. She has no rales. She exhibits no tenderness.  Abdominal: Soft. Bowel sounds are normal. She exhibits no distension and no mass. There is no tenderness. There is no rebound and no guarding.  Musculoskeletal: Normal range of motion.  Patient rises slowly from sitting to standing.  They walk without an antalgic gait.  There is no evidence of erythema, ecchymosis, or gross deformity.  There is tenderness to palpation over left lumbar paraspinal muscles.  Active ROM is limited due to pain especially with lateral bending and rotation.  Sensation to light touch is intact over all extremities.  Strength is symmetric and equal in all extremities.    Lymphadenopathy:    She has no cervical adenopathy.  Neurological: She is alert and oriented to person, place, and time.  Skin: Skin is warm and dry. No rash noted. She is not diaphoretic. No erythema. No pallor.  Psychiatric: She has a normal mood and affect. Her behavior is normal. Judgment and thought content normal.  Nursing note and vitals reviewed.   Vitals:   09/11/16 1102  BP: 130/82  Pulse: 72  Resp: 18  Temp: 98.2 F (36.8 C)  Assessment & Plan:    1. Urinary frequency -doubt that left flank pain is actually secondary to UTI.  Feel like this pain is much more musculoskeletal in nature.  I do believe that urinary frequency is likely more OAB.  We can try samples of myrbetriq 25 mg QD to see if this helps with nighttime urination.  IF UA/Urine Culture normal will have her go back to ortho for her back pain.   - Urinalysis, Routine w reflex microscopic - Culture, Urine

## 2016-09-12 LAB — URINALYSIS, MICROSCOPIC ONLY: Yeast: NONE SEEN [HPF]

## 2016-09-12 LAB — URINALYSIS, ROUTINE W REFLEX MICROSCOPIC
Bilirubin Urine: NEGATIVE
Glucose, UA: NEGATIVE
Hgb urine dipstick: NEGATIVE
Nitrite: NEGATIVE
Specific Gravity, Urine: 1.025 (ref 1.001–1.035)
pH: 5.5 (ref 5.0–8.0)

## 2016-09-12 LAB — URINE CULTURE

## 2016-09-26 ENCOUNTER — Encounter: Payer: Self-pay | Admitting: Physician Assistant

## 2016-09-26 ENCOUNTER — Ambulatory Visit (INDEPENDENT_AMBULATORY_CARE_PROVIDER_SITE_OTHER): Payer: PPO | Admitting: Physician Assistant

## 2016-09-26 VITALS — BP 130/88 | HR 94 | Temp 97.3°F | Resp 16 | Ht 67.0 in | Wt 158.2 lb

## 2016-09-26 DIAGNOSIS — J320 Chronic maxillary sinusitis: Secondary | ICD-10-CM | POA: Diagnosis not present

## 2016-09-26 DIAGNOSIS — J209 Acute bronchitis, unspecified: Secondary | ICD-10-CM | POA: Diagnosis not present

## 2016-09-26 DIAGNOSIS — R232 Flushing: Secondary | ICD-10-CM | POA: Diagnosis not present

## 2016-09-26 MED ORDER — DOXYCYCLINE HYCLATE 100 MG PO CAPS: ORAL_CAPSULE | ORAL | 0 refills | Status: DC

## 2016-09-26 NOTE — Progress Notes (Signed)
Subjective:    Patient ID: Latoya [name], female    DOB: [birth year], 66 y.o.   MRN: [id number]  HPI 66 y.o. WF with history of bronchitis presents with cough, has been following with Dr. Ashok Cordia. She is off the breo, states it made her "shake" She has had nonproductive cough and nasal congestion x 3 months, worse x 2 weeks. No fever or chills but she has been having sweating during the day, not at night.   Blood pressure 130/88, pulse 94, temperature 97.3 F (36.3 C), resp. rate 16, height 5\' 7"  (1.702 m), weight 158 lb 3.2 oz (71.8 kg), SpO2 97 %.  Medications Current Outpatient Prescriptions on File Prior to Visit  Medication Sig  . aspirin 81 MG tablet Take 81 mg by mouth at bedtime.   . Biotin 1000 MCG tablet Take 1,000 mcg by mouth 3 (three) times daily.  . Cholecalciferol (VITAMIN D3) 5000 UNITS CAPS Take 1 capsule by mouth daily.  . clidinium-chlordiazePOXIDE (LIBRAX) 5-2.5 MG capsule Take 1 capsule by mouth every 8 (eight) hours as needed.  . clonazePAM (KLONOPIN) 1 MG tablet Take 1 mg by mouth 2 (two) times daily as needed for anxiety.   Marland Kitchen diltiazem (CARDIZEM) 120 MG tablet Take 120 mg by mouth 2 (two) times daily.   . furosemide (LASIX) 20 MG tablet TAKE 1 TABLET BY MOUTH EVERY DAY  . guaiFENesin (MUCINEX) 600 MG 12 hr tablet Take 1 tablet (600 mg total) by mouth 2 (two) times daily.  . methocarbamol (ROBAXIN) 500 MG tablet Take 1 tablet (500 mg total) by mouth 3 (three) times daily between meals as needed.  . pantoprazole (PROTONIX) 40 MG tablet TAKE 1 TABLET BY MOUTH TWICE DAILY  . potassium chloride SA (K-DUR,KLOR-CON) 20 MEQ tablet TAKE 1 TABLET BY MOUTH TWICE DAILY.  . pravastatin (PRAVACHOL) 40 MG tablet TAKE 1 TABLET BY MOUTH EVERY NIGHT AT BEDTIME FOR CHOLESTEROL  . Probiotic Product (PROBIOTIC ACIDOPHILUS) CAPS Take 1 capsule by mouth 2 (two) times daily.   . sertraline (ZOLOFT) 100 MG tablet Take 100 mg by mouth daily.   No current facility-administered medications  on file prior to visit.     Problem list She has Esophageal reflux; Pain of upper abdomen; Generalized anxiety disorder; Essential hypertension; Mixed hyperlipidemia; Other abnormal glucose; Vitamin D deficiency; Medication management; H/O hiatal hernia; Thyroid nodule; Encounter for Medicare annual wellness exam; Aortic atherosclerosis ([place]); CKD (chronic kidney disease) stage 3, GFR 30-59 ml/min; Hiatal hernia; and Acute bronchitis on her problem list.  Review of Systems  Constitutional: Negative for chills and diaphoresis.  HENT: Positive for congestion, postnasal drip, sinus pressure and sneezing. Negative for ear pain and sore throat.   Respiratory: Positive for cough. Negative for chest tightness, shortness of breath and wheezing.   Cardiovascular: Negative.   Gastrointestinal: Negative.   Genitourinary: Negative.   Musculoskeletal: Negative for neck pain.  Neurological: Positive for headaches.       Objective:   Physical Exam  Constitutional: She appears well-developed and well-nourished.  HENT:  Head: Normocephalic and atraumatic.  Right Ear: External ear normal.  Nose: Right sinus exhibits maxillary sinus tenderness. Right sinus exhibits no frontal sinus tenderness. Left sinus exhibits maxillary sinus tenderness. Left sinus exhibits no frontal sinus tenderness.  Eyes: Conjunctivae and EOM are normal.  Neck: Normal range of motion. Neck supple.  Cardiovascular: Normal rate, regular rhythm, normal heart sounds and intact distal pulses.   Pulmonary/Chest: Effort normal and breath sounds normal. No respiratory distress. She  has no wheezes.  Abdominal: Soft. Bowel sounds are normal.  Lymphadenopathy:    She has no cervical adenopathy.  Skin: Skin is warm and dry.       Assessment & Plan:  1. Acute bronchitis, unspecified organism Continue follow up Off breo due to "shaking" will try asmenex follow up pulomary  2. Chronic maxillary sinusitis Continue claritin, mucinex,  nasonex - doxycycline (VIBRAMYCIN) 100 MG capsule; Take 1 capsule twice daily with food  Dispense: 20 capsule; Refill: 0  3. Hot flashes ? From sinus infection ? Hot flashes, declines meds at this time Up to date on preventative

## 2016-09-26 NOTE — Patient Instructions (Addendum)
Multinodular thyroid goiter or Multinodular thyroid gland.  Goiter means thyroid enlargement so if your thyroid is enlarged it is multinodular thyroid goiter or otherwise it is called multinodular thyroid gland.  It is very common, in more than 10 % of the population. That is about 30,000 people just here in [place]. The great majority are completely benign. We do not recommend biopsy until the nodules reach 39mm. Otherwise we monitor your thyroid with an ultrasound and repeated blood work monitoring its function. If they nodules are not increasing in size, we often stop monitoring with ultrasound.    HOW TO TREAT VIRAL COUGH AND COLD SYMPTOMS:  -Symptoms usually last at least 1 week with the worst symptoms being around day 4.  - colds usually start with a sore throat and end with a cough, and the cough can take 2 weeks to get better.  -No antibiotics are needed for colds, flu, sore throats, cough, bronchitis UNLESS symptoms are longer than 7 days OR if you are getting better then get drastically worse.  -There are a lot of combination medications (Dayquil, Nyquil, Vicks 44, tyelnol cold and sinus, ETC). Please look at the ingredients on the back so that you are treating the correct symptoms and not doubling up on medications/ingredients.    Medicines you can use  Nasal congestion  - pseudoephedrine (Sudafed)- behind the counter, do not use if you have high blood pressure, medicine that have -D in them.  - phenylephrine (Sudafed PE) -Dextormethorphan + chlorpheniramine (Coridcidin HBP)- okay if you have high blood pressure -Oxymetazoline (Afrin) nasal spray- LIMIT to 3 days -Saline nasal spray -Neti pot (used distilled or bottled water)  Ear pain/congestion  -pseudoephedrine (sudafed) - Nasonex/flonase nasal spray  Fever  -Acetaminophen (Tyelnol) -Ibuprofen (Advil, motrin, aleve)  Sore Throat  -Acetaminophen (Tyelnol) -Ibuprofen (Advil, motrin, aleve) -Drink a lot of  water -Gargle with salt water - Rest your voice (don't talk) -Throat sprays -Cough drops  Body Aches  -Acetaminophen (Tyelnol) -Ibuprofen (Advil, motrin, aleve)  Headache  -Acetaminophen (Tyelnol) -Ibuprofen (Advil, motrin, aleve) - Exedrin, Exedrin Migraine  Allergy symptoms (cough, sneeze, runny nose, itchy eyes) -Claritin or loratadine cheapest but likely the weakest  -Zyrtec or certizine at night because it can make you sleepy -The strongest is allegra or fexafinadine  Cheapest at [employer], [employer], [employer]  Cough  -Dextromethorphan (Delsym)- medicine that has DM in it -Guafenesin (Mucinex/Robitussin) - cough drops - drink lots of water  Chest Congestion  -Guafenesin (Mucinex/Robitussin)  Red Itchy Eyes  - Naphcon-A  Upset Stomach  - Bland diet (nothing spicy, greasy, fried, and high acid foods like tomatoes, oranges, berries) -OKAY- cereal, bread, soup, crackers, rice -Eat smaller more frequent meals -reduce caffeine, no alcohol -Loperamide (Imodium-AD) if diarrhea -Prevacid for heart burn  General health when sick  -Hydration -wash your hands frequently -keep surfaces clean -change pillow cases and sheets often -Get fresh air but do not exercise strenuously -Vitamin D, double up on it - Vitamin C -Zinc     Menopause Menopause is the normal time of life when menstrual periods stop completely. Menopause is complete when you have missed 12 consecutive menstrual periods. It usually occurs between the ages of 1 years and 78 years. Very rarely does a woman develop menopause before the age of 56 years. At menopause, your ovaries stop producing the female hormones estrogen and progesterone. This can cause undesirable symptoms and also affect your health. Sometimes the symptoms may occur 4-5 years before the menopause begins. There is no  relationship between menopause and:  Oral contraceptives.  Number of children you had.  Race.  The age your menstrual  periods started (menarche). Heavy smokers and very thin women may develop menopause earlier in life. What are the causes?  The ovaries stop producing the female hormones estrogen and progesterone. Other causes include:  Surgery to remove both ovaries.  The ovaries stop functioning for no known reason.  Tumors of the pituitary gland in the brain.  Medical disease that affects the ovaries and hormone production.  Radiation treatment to the abdomen or pelvis.  Chemotherapy that affects the ovaries. What are the signs or symptoms?  Hot flashes.  Night sweats.  Decrease in sex drive.  Vaginal dryness and thinning of the vagina causing painful intercourse.  Dryness of the skin and developing wrinkles.  Headaches.  Tiredness.  Irritability.  Memory problems.  Weight gain.  Bladder infections.  Hair growth of the face and chest.  Infertility. More serious symptoms include:  Loss of bone (osteoporosis) causing breaks (fractures).  Depression.  Hardening and narrowing of the arteries (atherosclerosis) causing heart attacks and strokes. How is this diagnosed?  When the menstrual periods have stopped for 12 straight months.  Physical exam.  Hormone studies of the blood. How is this treated? There are many treatment choices and nearly as many questions about them. The decisions to treat or not to treat menopausal changes is an individual choice made with your health care provider. Your health care provider can discuss the treatments with you. Together, you can decide which treatment will work best for you. Your treatment choices may include:  Hormone therapy (estrogen and progesterone).  Non-hormonal medicines.  Treating the individual symptoms with medicine (for example antidepressants for depression).  Herbal medicines that may help specific symptoms.  Counseling by a psychiatrist or psychologist.  Group therapy.  Lifestyle changes including:  Eating  healthy.  Regular exercise.  Limiting caffeine and alcohol.  Stress management and meditation.  No treatment. Follow these instructions at home:  Take the medicine your health care provider gives you as directed.  Get plenty of sleep and rest.  Exercise regularly.  Eat a diet that contains calcium (good for the bones) and soy products (acts like estrogen hormone).  Avoid alcoholic beverages.  Do not smoke.  If you have hot flashes, dress in layers.  Take supplements, calcium, and vitamin D to strengthen bones.  You can use over-the-counter lubricants or moisturizers for vaginal dryness.  Group therapy is sometimes very helpful.  Acupuncture may be helpful in some cases. Contact a health care provider if:  You are not sure you are in menopause.  You are having menopausal symptoms and need advice and treatment.  You are still having menstrual periods after age 23 years.  You have pain with intercourse.  Menopause is complete (no menstrual period for 12 months) and you develop vaginal bleeding.  You need a referral to a specialist (gynecologist, psychiatrist, or psychologist) for treatment. Get help right away if:  You have severe depression.  You have excessive vaginal bleeding.  You fell and think you have a broken bone.  You have pain when you urinate.  You develop leg or chest pain.  You have a fast pounding heart beat (palpitations).  You have severe headaches.  You develop vision problems.  You feel a lump in your breast.  You have abdominal pain or severe indigestion. This information is not intended to replace advice given to you by your health care provider.  Make sure you discuss any questions you have with your health care provider. Document Released: 11/11/2003 Document Revised: 01/27/2016 Document Reviewed: 03/20/2013 Elsevier Interactive Patient Education  2017 [employer].

## 2016-10-07 LAB — FUNGUS CULTURE W SMEAR

## 2016-10-07 LAB — AFB CULTURE WITH SMEAR (NOT AT ARMC): Source:: 0

## 2016-10-09 ENCOUNTER — Encounter: Payer: Self-pay | Admitting: Pulmonary Disease

## 2016-10-09 ENCOUNTER — Ambulatory Visit (INDEPENDENT_AMBULATORY_CARE_PROVIDER_SITE_OTHER): Payer: PPO | Admitting: Pulmonary Disease

## 2016-10-09 VITALS — BP 130/82 | HR 80 | Ht 67.0 in | Wt 158.6 lb

## 2016-10-09 DIAGNOSIS — K449 Diaphragmatic hernia without obstruction or gangrene: Secondary | ICD-10-CM | POA: Diagnosis not present

## 2016-10-09 DIAGNOSIS — J310 Chronic rhinitis: Secondary | ICD-10-CM | POA: Diagnosis not present

## 2016-10-09 DIAGNOSIS — K219 Gastro-esophageal reflux disease without esophagitis: Secondary | ICD-10-CM

## 2016-10-09 NOTE — Progress Notes (Signed)
Subjective:    Patient ID: Latoya [name], female    DOB: 01-Jul-1951, 66 y.o.   MRN: [id number]  C.C.:  Follow-up for wheezing & cough with known GERD.Marland Kitchen  HPI Wheezing & Cough: Patient was treated for acute bronchitis in December by myself with Mucinex. She reports she has been on multiple medication/antibiotic regimens. She reports her cough & wheezing have not resolved. She does feel she has drainage down the back of her throat causing the cough. She reports the mucus is "thick" with some "yellow".   GERD:  Follows with GI and has known hiatal hernia on imaging. Currently prescribed Carafate and Protonix. No reflux or dyspepsia per her report. She has adjusted her diet appropriately.   Review of Systems Denies any fever or chills. Has chronic sweats. She reports rare chest tightness with fatigue and over-exertion. No abdominal pain or nausea.   Allergies  Allergen Reactions  . Other     All pain medications: Unknown/ CODEINE  . Prednisone Other (See Comments)    DYSPHORIA  . Vioxx [Rofecoxib]   . Entex T [Pseudoephedrine-Guaifenesin] Palpitations  . Levaquin [Levofloxacin] Rash    Tolerated Avelox without adverse effect 11/08/15.  Marland Kitchen Penicillins Rash  . Sulfa Antibiotics Rash  . Trovan [Alatrofloxacin] Rash    Current Outpatient Prescriptions on File Prior to Visit  Medication Sig Dispense Refill  . aspirin 81 MG tablet Take 81 mg by mouth at bedtime.     . Biotin 1000 MCG tablet Take 1,000 mcg by mouth 3 (three) times daily.    . Cholecalciferol (VITAMIN D3) 5000 UNITS CAPS Take 1 capsule by mouth daily.    . clidinium-chlordiazePOXIDE (LIBRAX) 5-2.5 MG capsule Take 1 capsule by mouth every 8 (eight) hours as needed. 30 capsule 1  . clonazePAM (KLONOPIN) 1 MG tablet Take 1 mg by mouth 2 (two) times daily as needed for anxiety.   5  . diltiazem (CARDIZEM) 120 MG tablet Take 120 mg by mouth 2 (two) times daily.     . furosemide (LASIX) 20 MG tablet TAKE 1 TABLET BY MOUTH EVERY DAY  90 tablet 1  . guaiFENesin (MUCINEX) 600 MG 12 hr tablet Take 1 tablet (600 mg total) by mouth 2 (two) times daily. 60 tablet 2  . methocarbamol (ROBAXIN) 500 MG tablet Take 1 tablet (500 mg total) by mouth 3 (three) times daily between meals as needed. 20 tablet 0  . pantoprazole (PROTONIX) 40 MG tablet TAKE 1 TABLET BY MOUTH TWICE DAILY 60 tablet 2  . potassium chloride SA (K-DUR,KLOR-CON) 20 MEQ tablet TAKE 1 TABLET BY MOUTH TWICE DAILY. 180 tablet 1  . pravastatin (PRAVACHOL) 40 MG tablet TAKE 1 TABLET BY MOUTH EVERY NIGHT AT BEDTIME FOR CHOLESTEROL 90 tablet 1  . Probiotic Product (PROBIOTIC ACIDOPHILUS) CAPS Take 1 capsule by mouth 2 (two) times daily.     . sertraline (ZOLOFT) 100 MG tablet Take 100 mg by mouth daily.  3  . doxycycline (VIBRAMYCIN) 100 MG capsule Take 1 capsule twice daily with food (Patient not taking: Reported on 10/09/2016) 20 capsule 0   No current facility-administered medications on file prior to visit.     Past Medical History:  Diagnosis Date  . Anxiety and depression   . Arthritis   . Colon polyps   . GERD (gastroesophageal reflux disease)   . Hemorrhoids   . Hiatal hernia   . Hyperlipemia   . Hypertension   . Kidney stones   . Mixed hyperlipidemia 11/26/2013  .  Panic disorder   . Renal insufficiency    stage 3 kidney disease per her PCP  . Stomach ulcer   . Unspecified essential hypertension 11/26/2013    Past Surgical History:  Procedure Laterality Date  . ABDOMINAL HYSTERECTOMY    . CARPOMETACARPEL SUSPENSION PLASTY Right 12/08/2013   Procedure: CARPOMETACARPEL [facility]) SUSPENSION PLASTY;  Surgeon: Jolyn Nap, MD;  Location: [place];  Service: Orthopedics;  Laterality: Right;  . COLONOSCOPY    . FOOT SURGERY  2008   left  . OOPHORECTOMY    . TUBAL LIGATION    . UPPER GASTROINTESTINAL ENDOSCOPY      Family History  Problem Relation Age of Onset  . Heart disease Brother     x2  . Stroke Brother   . Heart disease Father    . Asthma Mother   . Colon cancer Neg Hx   . Stomach cancer Neg Hx     Social History   Social History  . Marital status: Married    Spouse name: N/A  . Number of children: 2  . Years of education: N/A   Occupational History  . Housewife    Social History Main Topics  . Smoking status: Passive Smoke Exposure - Never Smoker  . Smokeless tobacco: Never Used     Comment: Husband & Father smoked  . Alcohol use No  . Drug use: No  . Sexual activity: Yes    Partners: Male   Other Topics Concern  . None   Social History Narrative   Originally from [place]. Has always lived in [place]. Previously has traveled to [place]. No international travel. Enjoys planting gardens and working in her flowers. 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 No bird, mold, or hot tub exposure.       Objective:   Physical Exam BP 130/82 (BP Location: Right Arm, Patient Position: Sitting, Cuff Size: Normal)   Pulse 80   Ht 5\' 7"  (1.702 m)   Wt 158 lb 9.6 oz (71.9 kg)   SpO2 97%   BMI 24.84 kg/m   General:  Awake. Alert. Thin, Caucasian female.  Integument:  Warm & dry. No rash on exposed skin.  Extremities:  No cyanosis or clubbing.  Lymphatics: No appreciated cervical or supraclavicular lymphadenopathy. HEENT:  Moist mucus membranes. No oral ulcers. No scleral injection or icterus. Mild bilateral nasal turbinate swelling. No sinus tenderness to palpation. Minimal retropharyngeal cobblestoning. Cardiovascular:  Regular rate and rhythm. No edema. No appreciable JVD.  Pulmonary:  Good aeration & clear to auscultation bilaterally. Symmetric chest wall expansion. No accessory muscle use on room air. Abdomen: Soft. Normal bowel sounds. Nondistended.  Musculoskeletal:  Normal bulk and tone. No joint deformity or effusion appreciated.  General:  Awake. Comfortable. Husband with her today. Integument:  Warm & dry. No rash on exposed skin.  HEENT:  Moist mucus membranes. Mild bilateral nasal  turbinate swelling. No oral ulcers. Cardiovascular:  Regular rate. No edema. Normal S1 & S2.  Pulmonary:  Good aeration bilaterally. Clear with auscultation. Speaking in complete sentences. Musculoskeletal:  Normal bulk and tone. No joint deformity or effusion appreciated.  PFT 02/23/16: FVC 2.58 L (82%) FEV1 2.10 L (88%) FEV1/FVC 0.82 FEF 25-75 2.11 L (100%) no bronchodilator response TLC 4.64 L (91%) RV 95% ERV 10% DLCO corrected 76% (Hgb 14.1) 08/26/14: FVC 2.53 L (79%) FEV1 1.98 L (81%) FEV1/FVC 0.78 FEF 25-75 1.73 L (80%) no bronchodilator response TLC 4.24 L (84%) RV 66% ERV  17%  IMAGING CXR PA/LAT 08/22/16 (personally reviewed by me): No parenchymal nodule or opacity appreciated. No pleural effusion. Heart normal in size & mediastinum normal in contour.  CT CHEST W/O 12/24/15 (previously reviewed by me): 9 millimeter nodule in the thyroid gland. Large hiatal hernia. No parenchymal nodule or opacity. No pleural effusion or thickening. No pathologic mediastinal adenopathy. No pericardial effusion.  CXR PA/LAT 11/09/15 (previously reviewed by me): No focal opacity or effusion appreciated. Heart normal in size. Mediastinum normal in contour with suggestion of hiatal hernia.  MICROBIOLOGY Sputum Culture (08/22/16):  Rare budding yeast / Normal oral flora / AFB negative     Assessment & Plan:  66 y.o. female with underlying GERD and hiatal hernia. Patient seen last for acute bronchitis. With patient's perceived postnasal drainage and ongoing cough and wheezing without symptomatic response to antibiotics I do not feel further antibiotic treatment is necessary at this time. I reviewed her previous chest x-ray as well as sputum culture from December which showed no evidence of any parenchymal lung disease or organisms on culture that would indicate an infectious process. She would benefit from evaluation by ENT I feel. Her reflux seems to be well-controlled and I do not feel is contributing to her  constellation of symptoms. I instructed the patient contact my office if she had any new breathing problems or questions before next appointment.  1. Chronic Rhinitis: Referring patient to ENT for further evaluation. Checking maxillofacial/limited sinus CT scan without contrast. 2. GERD w/ Hiatal Hernia:  Continuing to follow with GI. Well-controlled on current regimen. No changes. 3. Health Maintenance:  S/P Influenza Vaccine October 2017, Pneumovax August 2015, & Prevnar October 2016. 4. Follow-up: Patient to return to clinic in 3 months or sooner if needed.  Sonia Baller Ashok Cordia, M.D. [facility] Pulmonary [facility] Pager:  [phone] After 3pm or if no response, call [phone] 3:13 PM 10/09/16

## 2016-10-09 NOTE — Patient Instructions (Signed)
   I'm referring you to an ENT doctor to address your sinuses which seem to be causing your coughing & wheezing.  I will see you back in 3 months or sooner if needed.  TESTS ORDERED: 1. Sinus CT Scan LTD w/o contrast

## 2016-10-10 ENCOUNTER — Ambulatory Visit: Payer: Self-pay | Admitting: Physician Assistant

## 2016-10-11 ENCOUNTER — Ambulatory Visit: Payer: Self-pay | Admitting: Internal Medicine

## 2016-10-12 ENCOUNTER — Telehealth: Payer: Self-pay | Admitting: Pulmonary Disease

## 2016-10-12 DIAGNOSIS — K219 Gastro-esophageal reflux disease without esophagitis: Secondary | ICD-10-CM | POA: Insufficient documentation

## 2016-10-12 NOTE — Telephone Encounter (Signed)
Spoke with Latoya [name] at [facility], she states pt called her to advise her to cancel her CT scan, she did not have a reason why. FYI Dr. Ashok Cordia.

## 2016-10-12 NOTE — Telephone Encounter (Signed)
Please make sure the patient is keeping her referral to ENT at least. Thanks.

## 2016-10-13 ENCOUNTER — Telehealth: Payer: Self-pay | Admitting: Internal Medicine

## 2016-10-13 NOTE — Telephone Encounter (Signed)
Patient advised she should avoid acidic drinks and caffeine

## 2016-10-13 NOTE — Telephone Encounter (Signed)
Called and spoke with pt and she stated that she did see Dr. Constance Holster yesterday.  She stated that Dr. Constance Holster told her that this was all related to the acid reflux.  She stated that once flu season is over, she will come in for a visit.  Will forward to JN to make him aware.

## 2016-10-20 ENCOUNTER — Other Ambulatory Visit: Payer: Self-pay | Admitting: Internal Medicine

## 2016-10-20 DIAGNOSIS — Z1231 Encounter for screening mammogram for malignant neoplasm of breast: Secondary | ICD-10-CM

## 2016-10-24 ENCOUNTER — Encounter: Payer: Self-pay | Admitting: Internal Medicine

## 2016-10-24 ENCOUNTER — Ambulatory Visit (INDEPENDENT_AMBULATORY_CARE_PROVIDER_SITE_OTHER): Payer: PPO | Admitting: Internal Medicine

## 2016-10-24 ENCOUNTER — Inpatient Hospital Stay: Admission: RE | Admit: 2016-10-24 | Payer: Self-pay | Source: Ambulatory Visit

## 2016-10-24 ENCOUNTER — Ambulatory Visit (HOSPITAL_COMMUNITY)
Admission: RE | Admit: 2016-10-24 | Discharge: 2016-10-24 | Disposition: A | Discharge status: Home / Self Care | Payer: PPO | Source: Ambulatory Visit | Attending: Internal Medicine | Admitting: Internal Medicine

## 2016-10-24 VITALS — BP 112/64 | HR 72 | Temp 98.0°F | Resp 18 | Ht 67.0 in | Wt 156.0 lb

## 2016-10-24 DIAGNOSIS — R109 Unspecified abdominal pain: Secondary | ICD-10-CM | POA: Insufficient documentation

## 2016-10-24 MED ORDER — TRAMADOL HCL 50 MG PO TABS: 50.0000 mg | ORAL_TABLET | Freq: Four times a day (QID) | ORAL | 0 refills | Status: DC | PRN

## 2016-10-24 NOTE — Progress Notes (Signed)
HPI  Patient presents to the office for evaluation of low back pain.  She reports that her lower back pain is wrapping around to the front and she is having some pain in her abdomen and dark colored urine.  She has not had any nausea or vomiting.  She reports that she has been drinking a lot of water.  She reports that she has had kidney stones before.  She reports that it has been years since her last stone.  She reports that she has never had the radiation of the pain to her labia before for UTI.  She does have some relief of her pain with heat.  She has not been taking any medications to help with her pain.    Review of Systems  Constitutional: Negative for chills, fever and malaise/fatigue.  Gastrointestinal: Negative for abdominal pain, blood in stool, constipation, diarrhea, heartburn, melena, nausea and vomiting.  Genitourinary: Positive for flank pain and hematuria. Negative for dysuria, frequency and urgency.  Neurological: Negative for dizziness, tremors and loss of consciousness.    PE:  Vitals:   10/24/16 1448  BP: 112/64  Pulse: 72  Resp: 18  Temp: 98 F (36.7 C)    General:  Alert and non-toxic, WDWN, NAD HEENT: NCAT, PERLA, EOM normal, no occular discharge or erythema.  Nasal mucosal edema with sinus tenderness to palpation.  Oropharynx clear with minimal oropharyngeal edema and erythema.  Mucous membranes moist and pink. Neck:  Cervical adenopathy Chest:  RRR no MRGs.  Lungs clear to auscultation A&P with no wheezes rhonchi or rales.   Abdomen: +BS x 4 quadrants, soft, non-tender, no guarding, rigidity, or rebound.  Mild left flank pain.  No CVA tenderness to palpation Skin: warm and dry no rash Neuro: A&Ox4, CN II-XII grossly intact  Assessment and Plan:   1. Left flank pain -tramadol prn for pain -possible kidney stone vs. Musculoskeletal pain -may need ultrasound if visible stone to look for hydronephrosis vs. CT abdomen -rule out infection.  Call abx in if UA  suggestive of bacterial UTI - Urinalysis, Routine w reflex microscopic - Culture, Urine - DG Abd 1 View; Future

## 2016-10-25 LAB — URINALYSIS, ROUTINE W REFLEX MICROSCOPIC
Bilirubin Urine: NEGATIVE
Glucose, UA: NEGATIVE
Hgb urine dipstick: NEGATIVE
Leukocytes, UA: NEGATIVE
Nitrite: NEGATIVE
Protein, ur: NEGATIVE
Specific Gravity, Urine: 1.013 (ref 1.001–1.035)
pH: 6 (ref 5.0–8.0)

## 2016-10-25 LAB — URINE CULTURE: Organism ID, Bacteria: NO GROWTH

## 2016-10-26 ENCOUNTER — Telehealth: Payer: Self-pay | Admitting: *Deleted

## 2016-10-26 ENCOUNTER — Ambulatory Visit: Payer: Self-pay | Admitting: Pulmonary Disease

## 2016-10-26 NOTE — Telephone Encounter (Signed)
Patient aware of results.

## 2016-11-20 ENCOUNTER — Telehealth: Payer: Self-pay | Admitting: Internal Medicine

## 2016-11-20 NOTE — Telephone Encounter (Signed)
A user error has taken place.

## 2016-11-21 ENCOUNTER — Ambulatory Visit: Payer: Self-pay | Admitting: Internal Medicine

## 2016-11-23 ENCOUNTER — Ambulatory Visit
Admission: RE | Admit: 2016-11-23 | Discharge: 2016-11-23 | Disposition: A | Discharge status: Home / Self Care | Payer: PPO | Source: Ambulatory Visit | Attending: Internal Medicine | Admitting: Internal Medicine

## 2016-11-23 DIAGNOSIS — Z1231 Encounter for screening mammogram for malignant neoplasm of breast: Secondary | ICD-10-CM

## 2016-11-24 ENCOUNTER — Other Ambulatory Visit: Payer: Self-pay | Admitting: Internal Medicine

## 2016-11-24 DIAGNOSIS — R928 Other abnormal and inconclusive findings on diagnostic imaging of breast: Secondary | ICD-10-CM

## 2016-11-27 ENCOUNTER — Telehealth: Payer: Self-pay

## 2016-11-27 NOTE — Telephone Encounter (Signed)
Pt called c/o ankles swelling & asking if she can adjust fluid med.  Did not check B/P today but states B/P is good.  Per provider can increase to 2 tablet a day for 3-4 days then back to 1 tablet.  Elevate legs 20 mins a day compression stocking Decrease salt/sugar If not better need OV   Pt agreed & hung up.

## 2016-11-28 ENCOUNTER — Ambulatory Visit
Admission: RE | Admit: 2016-11-28 | Discharge: 2016-11-28 | Disposition: A | Discharge status: Home / Self Care | Payer: PPO | Source: Ambulatory Visit | Attending: Internal Medicine | Admitting: Internal Medicine

## 2016-11-28 DIAGNOSIS — R928 Other abnormal and inconclusive findings on diagnostic imaging of breast: Secondary | ICD-10-CM

## 2016-12-20 ENCOUNTER — Ambulatory Visit (INDEPENDENT_AMBULATORY_CARE_PROVIDER_SITE_OTHER): Payer: PPO | Admitting: Physician Assistant

## 2016-12-20 ENCOUNTER — Encounter: Payer: Self-pay | Admitting: Physician Assistant

## 2016-12-20 VITALS — BP 116/72 | HR 80 | Temp 97.9°F | Resp 16 | Ht 67.0 in | Wt 152.2 lb

## 2016-12-20 DIAGNOSIS — I700 Atherosclerosis of aorta: Secondary | ICD-10-CM

## 2016-12-20 DIAGNOSIS — H6122 Impacted cerumen, left ear: Secondary | ICD-10-CM

## 2016-12-20 DIAGNOSIS — E782 Mixed hyperlipidemia: Secondary | ICD-10-CM

## 2016-12-20 DIAGNOSIS — R350 Frequency of micturition: Secondary | ICD-10-CM | POA: Diagnosis not present

## 2016-12-20 DIAGNOSIS — J449 Chronic obstructive pulmonary disease, unspecified: Secondary | ICD-10-CM | POA: Diagnosis not present

## 2016-12-20 DIAGNOSIS — I10 Essential (primary) hypertension: Secondary | ICD-10-CM | POA: Diagnosis not present

## 2016-12-20 DIAGNOSIS — H9202 Otalgia, left ear: Secondary | ICD-10-CM | POA: Diagnosis not present

## 2016-12-20 DIAGNOSIS — E041 Nontoxic single thyroid nodule: Secondary | ICD-10-CM

## 2016-12-20 DIAGNOSIS — Z79899 Other long term (current) drug therapy: Secondary | ICD-10-CM

## 2016-12-20 DIAGNOSIS — E559 Vitamin D deficiency, unspecified: Secondary | ICD-10-CM

## 2016-12-20 LAB — CBC WITH DIFFERENTIAL/PLATELET
Basophils Absolute: 0 cells/uL (ref 0–200)
Basophils Relative: 0 %
Eosinophils Absolute: 190 cells/uL (ref 15–500)
Eosinophils Relative: 2 %
HCT: 45.1 % — ABNORMAL HIGH (ref 35.0–45.0)
Hemoglobin: 14.9 g/dL (ref 11.7–15.5)
Lymphocytes Relative: 21 %
Lymphs Abs: 1995 cells/uL (ref 850–3900)
MCH: 29.8 pg (ref 27.0–33.0)
MCHC: 33 g/dL (ref 32.0–36.0)
MCV: 90.2 fL (ref 80.0–100.0)
MPV: 9.6 fL (ref 7.5–12.5)
Monocytes Absolute: 1045 cells/uL — ABNORMAL HIGH (ref 200–950)
Monocytes Relative: 11 %
Neutro Abs: 6270 cells/uL (ref 1500–7800)
Neutrophils Relative %: 66 %
Platelets: 235 10*3/uL (ref 140–400)
RBC: 5 MIL/uL (ref 3.80–5.10)
RDW: 13.5 % (ref 11.0–15.0)
WBC: 9.5 10*3/uL (ref 3.8–10.8)

## 2016-12-20 LAB — LIPID PANEL
Cholesterol: 179 mg/dL (ref <200)
HDL: 49 mg/dL — ABNORMAL LOW (ref >50)
LDL Cholesterol: 88 mg/dL (ref <100)
Total CHOL/HDL Ratio: 3.7 Ratio (ref <5.0)
Triglycerides: 210 mg/dL — ABNORMAL HIGH (ref <150)
VLDL: 42 mg/dL — ABNORMAL HIGH (ref <30)

## 2016-12-20 LAB — TSH: TSH: 2.89 mIU/L

## 2016-12-20 LAB — BASIC METABOLIC PANEL WITH GFR
BUN: 16 mg/dL (ref 7–25)
CO2: 24 mmol/L (ref 20–31)
Calcium: 9.9 mg/dL (ref 8.6–10.4)
Chloride: 104 mmol/L (ref 98–110)
Creat: 1.1 mg/dL — ABNORMAL HIGH (ref 0.50–0.99)
GFR, Est African American: 60 mL/min (ref >=60)
GFR, Est Non African American: 52 mL/min — ABNORMAL LOW (ref >=60)
Glucose, Bld: 81 mg/dL (ref 65–99)
Potassium: 3.8 mmol/L (ref 3.5–5.3)
Sodium: 140 mmol/L (ref 135–146)

## 2016-12-20 LAB — HEPATIC FUNCTION PANEL
ALT: 23 U/L (ref 6–29)
AST: 29 U/L (ref 10–35)
Albumin: 4.2 g/dL (ref 3.6–5.1)
Alkaline Phosphatase: 109 U/L (ref 33–130)
Bilirubin, Direct: 0.1 mg/dL (ref <=0.2)
Indirect Bilirubin: 0.3 mg/dL (ref 0.2–1.2)
Total Bilirubin: 0.4 mg/dL (ref 0.2–1.2)
Total Protein: 6.9 g/dL (ref 6.1–8.1)

## 2016-12-20 MED ORDER — CEFUROXIME AXETIL 250 MG PO TABS: 250.0000 mg | ORAL_TABLET | Freq: Two times a day (BID) | ORAL | 0 refills | Status: DC

## 2016-12-20 NOTE — Progress Notes (Signed)
Subjective:    Patient ID: Latoya [name], female    DOB: 11/26/1950, 66 y.o.   MRN: [id number]  HPI 66 y.o. WF presents with possible UTI x 1 week and 6 month follow up.   Having suprapubic pain, right lower quadrant pain and right flank pain with fatigue x 1 weeks, getting worse. Urgency, frequency, nocturia x 2-3, denies nausea, fever, chills, hematuria.   She also has been having decreased hearing left ear.   Her blood pressure has been controlled at home, today their BP is BP: 116/72  She does not workout. She denies chest pain, shortness of breath, dizziness. She has COPD, aortic atherosclerosis, advised to stop smoking, stay on bASA.   She is not on cholesterol medication and denies myalgias. Her cholesterol is not at goal. The cholesterol last visit was:   Lab Results  Component Value Date   CHOL 213 (H) 06/30/2016   HDL 60 06/30/2016   LDLCALC 127 06/30/2016   TRIG 132 06/30/2016   CHOLHDL 3.6 06/30/2016    She has been working on diet and exercise for prediabetes, and denies paresthesia of the feet, polydipsia, polyuria and visual disturbances. Last A1C in the office was:  Lab Results  Component Value Date   HGBA1C 5.6 06/30/2016   Patient is on Vitamin D supplement.   Lab Results  Component Value Date   VD25OH 61 06/30/2016     BMI is Body mass index is 23.84 kg/m., she is working on diet and exercise. Wt Readings from Last 3 Encounters:  12/20/16 152 lb 3.2 oz (69 kg)  10/24/16 156 lb (70.8 kg)  10/09/16 158 lb 9.6 oz (71.9 kg)     Blood pressure 116/72, pulse 80, temperature 97.9 F (36.6 C), resp. rate 16, height 5\' 7"  (1.702 m), weight 152 lb 3.2 oz (69 kg), SpO2 98 %.  Medications Current Outpatient Prescriptions on File Prior to Visit  Medication Sig  . aspirin 81 MG tablet Take 81 mg by mouth at bedtime.   . Biotin 1000 MCG tablet Take 1,000 mcg by mouth 3 (three) times daily.  . Cholecalciferol (VITAMIN D3) 5000 UNITS CAPS Take 1 capsule by mouth  daily.  . clidinium-chlordiazePOXIDE (LIBRAX) 5-2.5 MG capsule Take 1 capsule by mouth every 8 (eight) hours as needed.  . clonazePAM (KLONOPIN) 1 MG tablet Take 1 mg by mouth 2 (two) times daily as needed for anxiety.   Marland Kitchen diltiazem (CARDIZEM) 120 MG tablet Take 120 mg by mouth 2 (two) times daily.   Marland Kitchen doxycycline (VIBRAMYCIN) 100 MG capsule Take 1 capsule twice daily with food  . furosemide (LASIX) 20 MG tablet TAKE 1 TABLET BY MOUTH EVERY DAY  . guaiFENesin (MUCINEX) 600 MG 12 hr tablet Take 1 tablet (600 mg total) by mouth 2 (two) times daily.  . methocarbamol (ROBAXIN) 500 MG tablet Take 1 tablet (500 mg total) by mouth 3 (three) times daily between meals as needed.  . potassium chloride SA (K-DUR,KLOR-CON) 20 MEQ tablet TAKE 1 TABLET BY MOUTH TWICE DAILY.  . pravastatin (PRAVACHOL) 40 MG tablet TAKE 1 TABLET BY MOUTH EVERY NIGHT AT BEDTIME FOR CHOLESTEROL  . Probiotic Product (PROBIOTIC ACIDOPHILUS) CAPS Take 1 capsule by mouth 2 (two) times daily.   . sertraline (ZOLOFT) 100 MG tablet Take 100 mg by mouth daily.  . traMADol (ULTRAM) 50 MG tablet Take 1 tablet (50 mg total) by mouth every 6 (six) hours as needed.   No current facility-administered medications on file prior to visit.  Problem list She has Esophageal reflux; Pain of upper abdomen; Generalized anxiety disorder; Essential hypertension; Mixed hyperlipidemia; Other abnormal glucose; Vitamin D deficiency; Medication management; H/O hiatal hernia; Thyroid nodule; Encounter for Medicare annual wellness exam; Aortic atherosclerosis ([place]); CKD (chronic kidney disease) stage 3, GFR 30-59 ml/min; Hiatal hernia; and Chronic rhinitis on her problem list.  Review of Systems  Constitutional: Negative for chills.  HENT: Positive for ear pain.   Respiratory: Negative.   Cardiovascular: Negative.   Gastrointestinal: Negative.  Negative for nausea and vomiting.  Genitourinary: Positive for dysuria, frequency and urgency. Negative for  decreased urine volume, difficulty urinating, dyspareunia, enuresis, flank pain, genital sores, hematuria, menstrual problem, pelvic pain, vaginal bleeding and vaginal discharge.       Objective:   Physical Exam  Constitutional: She is oriented to person, place, and time. She appears well-developed and well-nourished. No distress.  HENT:  Head: Normocephalic.  Right Ear: Hearing, tympanic membrane and external ear normal.  Left Ear: No tenderness. No mastoid tenderness. Decreased hearing is noted.  Mouth/Throat: Oropharynx is clear and moist. No oropharyngeal exudate.  Eyes: Conjunctivae are normal. No scleral icterus.  Neck: Normal range of motion. Neck supple. No JVD present. No thyromegaly present.  Cardiovascular: Normal rate, regular rhythm, normal heart sounds and intact distal pulses.  Exam reveals no gallop and no friction rub.   No murmur heard. Pulmonary/Chest: Effort normal and breath sounds normal. No respiratory distress. She has no wheezes. She has no rales. She exhibits no tenderness.  Abdominal: Soft. Bowel sounds are normal. She exhibits no distension and no mass. There is no tenderness. There is no rebound and no guarding.  Musculoskeletal: Normal range of motion.  Patient rises slowly from sitting to standing.  They walk without an antalgic gait.  There is no evidence of erythema, ecchymosis, or gross deformity.  There is tenderness to palpation over left lumbar paraspinal muscles.  Active ROM is limited due to pain especially with lateral bending and rotation.  Sensation to light touch is intact over all extremities.  Strength is symmetric and equal in all extremities.    Lymphadenopathy:    She has no cervical adenopathy.  Neurological: She is alert and oriented to person, place, and time.  Skin: Skin is warm and dry. No rash noted. She is not diaphoretic. No erythema. No pallor.  Psychiatric: She has a normal mood and affect. Her behavior is normal. Judgment and thought  content normal.  Nursing note and vitals reviewed.     Assessment & Plan:   Urinary frequency -     Urinalysis, Routine w reflex microscopic -     Urine culture -     cefUROXime (CEFTIN) 250 MG tablet; Take 1 tablet (250 mg total) by mouth 2 (two) times daily.   Essential hypertension - continue medications, DASH diet, exercise and monitor at home. Call if greater than 130/80.  -     CBC with Differential/Platelet -     BASIC METABOLIC PANEL WITH GFR -     Hepatic function panel  Thyroid nodule -     TSH  Mixed hyperlipidemia -continue medications, check lipids, decrease fatty foods, increase activity.  -     Lipid panel  Vitamin D deficiency  Medication management -     Magnesium  Aortic atherosclerosis (HCC) Control blood pressure, cholesterol, glucose, increase exercise.  -     Lipid panel  Chronic obstructive pulmonary disease, unspecified COPD type ([place]) -Advised to stop smoking, will get CXR, continue  meds.    Future Appointments Date Time Provider [place]  01/02/2017 8:30 AM Gatha Mayer, MD [facility]  01/09/2017 11:30 AM [name], PA-C [facility] None  01/16/2017 9:00 AM Javier Glazier, MD [facility] None  04/26/2017 11:15 AM Unk Pinto, MD [facility] None

## 2016-12-21 LAB — MAGNESIUM: Magnesium: 2.5 mg/dL (ref 1.5–2.5)

## 2016-12-21 LAB — URINALYSIS, ROUTINE W REFLEX MICROSCOPIC
Bilirubin Urine: NEGATIVE
Glucose, UA: NEGATIVE
Hgb urine dipstick: NEGATIVE
Ketones, ur: NEGATIVE
Leukocytes, UA: NEGATIVE
Nitrite: NEGATIVE
Specific Gravity, Urine: 1.017 (ref 1.001–1.035)
pH: 6 (ref 5.0–8.0)

## 2016-12-22 LAB — URINE CULTURE: Organism ID, Bacteria: NO GROWTH

## 2016-12-22 NOTE — Progress Notes (Signed)
Pt aware of lab results & voiced understanding of those results.

## 2017-01-02 ENCOUNTER — Ambulatory Visit (INDEPENDENT_AMBULATORY_CARE_PROVIDER_SITE_OTHER): Payer: PPO | Admitting: Internal Medicine

## 2017-01-02 ENCOUNTER — Encounter (INDEPENDENT_AMBULATORY_CARE_PROVIDER_SITE_OTHER): Payer: Self-pay

## 2017-01-02 ENCOUNTER — Encounter: Payer: Self-pay | Admitting: Internal Medicine

## 2017-01-02 ENCOUNTER — Ambulatory Visit: Payer: Self-pay | Admitting: Internal Medicine

## 2017-01-02 VITALS — BP 154/80 | HR 80 | Ht 63.0 in | Wt 151.1 lb

## 2017-01-02 DIAGNOSIS — K449 Diaphragmatic hernia without obstruction or gangrene: Secondary | ICD-10-CM | POA: Diagnosis not present

## 2017-01-02 DIAGNOSIS — M479 Spondylosis, unspecified: Secondary | ICD-10-CM | POA: Diagnosis not present

## 2017-01-02 DIAGNOSIS — G8929 Other chronic pain: Secondary | ICD-10-CM

## 2017-01-02 DIAGNOSIS — R109 Unspecified abdominal pain: Secondary | ICD-10-CM | POA: Diagnosis not present

## 2017-01-02 MED ORDER — DICLOFENAC SODIUM 1 % TD GEL: 4.0000 g | Freq: Four times a day (QID) | TRANSDERMAL | 11 refills | Status: DC

## 2017-01-02 NOTE — Progress Notes (Signed)
   Latoya [name] 66 y.o. May 31, 1951 [id number]  Assessment & Plan:   Encounter Diagnoses  Name Primary?  . Left flank pain, chronic Yes  . Osteoarthritis of spine, unspecified spinal osteoarthritis complication status, unspecified spinal region   . Hiatal hernia      He is stable. I don't think her symptoms are necessarily GI in origin. Probably musculoskeletal and perhaps related to degenerative spine disease. It sounds like she is willing to live with it. However we will try diclofenac 1% gel to see if that makes a difference.  I don't think her hiatal hernias necessarily all that symptomatic, it certainly isn't now that she is modified her diet and is following and acid reflux diet. She will continue that me as needed.  Subjective:   Chief Complaint:Left flank pain hiatal hernia  HPI The patient is here for follow-up after she saw Alonza Bogus PAC in the fall. At that time an upper GI series demonstrated a sliding hiatal hernia. Since then the patient has changed her diet quite a bit and reduced or eliminated refluxogenic foods and heartburn and indigestion symptoms are not really a problem. She is only using sucralfate and Pepcid on an as-needed basis. Not too common. She continues to have a left flank pain. He seems to be more with twisting and bending etc. She has back pain and recently had an epidural injection but is not sure she will return for that as that was uncomfortable and she's not convinced its helping at this point. Wt Readings from Last 3 Encounters:  01/02/17 151 lb 2 oz (68.5 kg)  12/20/16 152 lb 3.2 oz (69 kg)  10/24/16 156 lb (70.8 kg)   Medications, allergies, past medical history, past surgical history, family history and social history are reviewed and updated in the EMR.   Review of Systems As above  Objective:   Physical Exam BP (!) 154/80 (BP Location: Left Arm, Patient Position: Sitting, Cuff Size: Normal)   Pulse 80   Ht 5\' 3"  (1.6 m) Comment:  height measured without shoes  Wt 151 lb 2 oz (68.5 kg)   BMI 26.77 kg/m  L flank and rib tenderness LLQ tenderness - mild, deep No CVAT  25 minutes time spent with patient > half in counseling coordination of care

## 2017-01-02 NOTE — Patient Instructions (Signed)
  We have sent the following medications to your pharmacy for you to pick up at your convenience: Generic voltaren gel   Follow up with Dr Carlean Purl as needed.    I appreciate the opportunity to care for you. Silvano Rusk, MD, [facility]

## 2017-01-09 ENCOUNTER — Ambulatory Visit: Payer: Self-pay | Admitting: Internal Medicine

## 2017-01-09 ENCOUNTER — Ambulatory Visit: Payer: Self-pay | Admitting: Physician Assistant

## 2017-01-16 ENCOUNTER — Ambulatory Visit: Payer: Self-pay | Admitting: Pulmonary Disease

## 2017-01-17 ENCOUNTER — Encounter (HOSPITAL_COMMUNITY): Payer: Self-pay

## 2017-01-17 ENCOUNTER — Emergency Department (HOSPITAL_COMMUNITY): Payer: PPO

## 2017-01-17 ENCOUNTER — Emergency Department (HOSPITAL_COMMUNITY)
Admission: EM | Admit: 2017-01-17 | Discharge: 2017-01-18 | Disposition: A | Discharge status: Home / Self Care | Payer: PPO | Attending: Emergency Medicine | Admitting: Emergency Medicine

## 2017-01-17 DIAGNOSIS — Z79899 Other long term (current) drug therapy: Secondary | ICD-10-CM | POA: Insufficient documentation

## 2017-01-17 DIAGNOSIS — Y999 Unspecified external cause status: Secondary | ICD-10-CM | POA: Diagnosis not present

## 2017-01-17 DIAGNOSIS — Y939 Activity, unspecified: Secondary | ICD-10-CM | POA: Insufficient documentation

## 2017-01-17 DIAGNOSIS — S61411A Laceration without foreign body of right hand, initial encounter: Secondary | ICD-10-CM | POA: Diagnosis not present

## 2017-01-17 DIAGNOSIS — N183 Chronic kidney disease, stage 3 (moderate): Secondary | ICD-10-CM | POA: Insufficient documentation

## 2017-01-17 DIAGNOSIS — Z7722 Contact with and (suspected) exposure to environmental tobacco smoke (acute) (chronic): Secondary | ICD-10-CM | POA: Diagnosis not present

## 2017-01-17 DIAGNOSIS — Z7982 Long term (current) use of aspirin: Secondary | ICD-10-CM | POA: Insufficient documentation

## 2017-01-17 DIAGNOSIS — Y92009 Unspecified place in unspecified non-institutional (private) residence as the place of occurrence of the external cause: Secondary | ICD-10-CM | POA: Diagnosis not present

## 2017-01-17 DIAGNOSIS — W540XXA Bitten by dog, initial encounter: Secondary | ICD-10-CM | POA: Insufficient documentation

## 2017-01-17 DIAGNOSIS — S60571A Other superficial bite of hand of right hand, initial encounter: Secondary | ICD-10-CM | POA: Diagnosis present

## 2017-01-17 DIAGNOSIS — I129 Hypertensive chronic kidney disease with stage 1 through stage 4 chronic kidney disease, or unspecified chronic kidney disease: Secondary | ICD-10-CM | POA: Diagnosis not present

## 2017-01-17 MED ORDER — CEFAZOLIN SODIUM-DEXTROSE 2-4 GM/100ML-% IV SOLN
2.0000 g | Freq: Once | INTRAVENOUS | Status: AC
  Administered 2017-01-17: 2 g via INTRAVENOUS
  Filled 2017-01-17: qty 100

## 2017-01-17 MED ORDER — LIDOCAINE HCL (PF) 1 % IJ SOLN
10.0000 mL | Freq: Once | INTRAMUSCULAR | Status: AC
  Administered 2017-01-18: 10 mL via INTRADERMAL
  Filled 2017-01-17: qty 10

## 2017-01-17 MED ORDER — OXYCODONE-ACETAMINOPHEN 5-325 MG PO TABS
ORAL_TABLET | ORAL | Status: DC
Start: 2017-01-17 — End: 2017-01-18
  Filled 2017-01-17: qty 1

## 2017-01-17 NOTE — ED Triage Notes (Signed)
Pt presents to the ed with complaint of a dog bite, her daughter dog bit her right hand, tendons are showing with a large skin flap, bleeding controlled, cms intact.  Dog is up to date on his shots.

## 2017-01-17 NOTE — ED Notes (Signed)
Patient transported to X-ray 

## 2017-01-17 NOTE — ED Provider Notes (Signed)
[facility] DEPT Provider Note   CSN: [id number] Arrival date & time: 01/17/17  2003     History   Chief Complaint Chief Complaint  Patient presents with  . Animal Bite    HPI Latoya [name] is a 66 y.o. female.  The history is provided by the patient.  Animal Bite  Contact animal:  Dog Animal bite location: right hand. Time since incident: 7:30pm. Pain details:    Quality:  Aching   Severity:  Mild   Timing:  Constant   Progression:  Unchanged Incident location:  Home Animal's rabies vaccination status:  Up to date Animal in possession: yes   Tetanus status:  Up to date Relieved by:  Prescription drugs Worsened by:  Nothing Ineffective treatments:  None tried Associated symptoms: no fever     Past Medical History:  Diagnosis Date  . Anxiety and depression   . Arthritis   . Colon polyps   . GERD (gastroesophageal reflux disease)   . Hemorrhoids   . Hiatal hernia   . Hyperlipemia   . Hypertension   . Kidney stones   . Mixed hyperlipidemia 11/26/2013  . Panic disorder   . Renal insufficiency    stage 3 kidney disease per her PCP  . Stomach ulcer   . Unspecified essential hypertension 11/26/2013    Patient Active Problem List   Diagnosis Date Noted  . Chronic rhinitis 10/09/2016  . Hiatal hernia 07/14/2016  . Encounter for Medicare annual wellness exam 06/30/2016  . Aortic atherosclerosis ([place]) 06/30/2016  . CKD (chronic kidney disease) stage 3, GFR 30-59 ml/min 06/30/2016  . H/O hiatal hernia 02/29/2016  . Thyroid nodule 12/24/2015  . Other abnormal glucose 05/01/2014  . Vitamin D deficiency 05/01/2014  . Medication management 05/01/2014  . Essential hypertension 11/26/2013  . Mixed hyperlipidemia 11/26/2013  . Generalized anxiety disorder 07/11/2013  . Esophageal reflux 11/29/2012  . Pain of upper abdomen 11/29/2012    Past Surgical History:  Procedure Laterality Date  . ABDOMINAL HYSTERECTOMY    . CARPOMETACARPEL SUSPENSION PLASTY Right  12/08/2013   Procedure: CARPOMETACARPEL [facility]) SUSPENSION PLASTY;  Surgeon: Jolyn Nap, MD;  Location: [facility];  Service: Orthopedics;  Laterality: Right;  . COLONOSCOPY    . FOOT SURGERY  2008   left  . OOPHORECTOMY    . TUBAL LIGATION    . UPPER GASTROINTESTINAL ENDOSCOPY      OB History    No data available       Home Medications    Prior to Admission medications   Medication Sig Start Date End Date Taking? Authorizing Provider  aspirin 81 MG tablet Take 81 mg by mouth at bedtime.     [provider]  Biotin 1000 MCG tablet Take 1,000 mcg by mouth 3 (three) times daily.    [provider]  Cholecalciferol (VITAMIN D3) 5000 UNITS CAPS Take 1 capsule by mouth daily.    [provider]  ciprofloxacin (CIPRO) 500 MG tablet Take 1 tablet (500 mg total) by mouth 2 (two) times daily. 01/18/17 01/25/17  Heriberto Antigua, MD  clindamycin (CLEOCIN) 150 MG capsule Take 3 capsules (450 mg total) by mouth 3 (three) times daily. 01/18/17 01/25/17  Heriberto Antigua, MD  clonazePAM (KLONOPIN) 1 MG tablet Take 1 mg by mouth 2 (two) times daily as needed for anxiety.  05/31/15   [provider]  diclofenac sodium (VOLTAREN) 1 % GEL Apply 4 g topically 4 (four) times daily. To affected area 01/02/17  Gatha Mayer, MD  diltiazem (CARDIZEM) 120 MG tablet Take 120 mg by mouth 2 (two) times daily.     [provider]  Famotidine (PEPCID PO) Take by mouth.    [provider]  furosemide (LASIX) 20 MG tablet TAKE 1 TABLET BY MOUTH EVERY DAY Patient taking differently: TAKE 1 TABLET BY MOUTH AS NEEDED 09/06/16   Unk Pinto, MD  magic mouthwash SOLN Take 5 mLs by mouth as needed for mouth pain.    [provider]  oxyCODONE-acetaminophen (PERCOCET/ROXICET) 5-325 MG tablet Take 1 tablet by mouth every 8 (eight) hours as needed for severe pain. 01/18/17   Heriberto Antigua, MD  potassium chloride SA (K-DUR,KLOR-CON) 20 MEQ tablet  TAKE 1 TABLET BY MOUTH TWICE DAILY. Patient taking differently: TAKE 1 TABLET BY MOUTH AS NEEDED WITH LASIX 09/06/16   Unk Pinto, MD  pravastatin (PRAVACHOL) 40 MG tablet TAKE 1 TABLET BY MOUTH EVERY NIGHT AT BEDTIME FOR CHOLESTEROL 06/07/14   Unk Pinto, MD  Probiotic Product (PROBIOTIC ACIDOPHILUS) CAPS Take 1 capsule by mouth 2 (two) times daily.     [provider]  PROMETHAZINE-CODEINE PO Take by mouth as needed.    [provider]  sertraline (ZOLOFT) 100 MG tablet Take 100 mg by mouth daily. 05/31/15   [provider]  sucralfate (CARAFATE) 1 g tablet Take 1 g by mouth as needed.    [provider]    Family History Family History  Problem Relation Age of Onset  . Heart disease Brother        x2  . Stroke Brother   . Heart disease Father   . Asthma Mother   . Colon cancer Neg Hx   . Stomach cancer Neg Hx   . Breast cancer Neg Hx     Social History Social History  Substance Use Topics  . Smoking status: Passive Smoke Exposure - Never Smoker  . Smokeless tobacco: Never Used     Comment: Husband & Father smoked  . Alcohol use No     Allergies   Other; Prednisone; Vioxx [rofecoxib]; Entex t [pseudoephedrine-guaifenesin]; Levaquin [levofloxacin]; Penicillins; Sulfa antibiotics; and Trovan [alatrofloxacin]   Review of Systems Review of Systems  Constitutional: Negative for fever.  Respiratory: Negative for cough and shortness of breath.   Cardiovascular: Negative for chest pain.  All other systems reviewed and are negative.    Physical Exam Updated Vital Signs BP (!) 153/77   Pulse 86   Temp 98.9 F (37.2 C)   Resp 18   Wt 69.4 kg   SpO2 100%   BMI 27.10 kg/m   Physical Exam  Constitutional: She appears well-developed and well-nourished. No distress.  HENT:  Head: Normocephalic and atraumatic.  Mouth/Throat: Oropharynx is clear and moist.  Eyes: Conjunctivae and EOM are normal.  Neck: Neck supple.    Cardiovascular: Normal rate, regular rhythm and intact distal pulses.   No murmur heard. Pulmonary/Chest: Effort normal and breath sounds normal. No respiratory distress.  Abdominal: Soft. There is no tenderness. There is no guarding.  Musculoskeletal: She exhibits tenderness. She exhibits no edema.  Patient has a large avulsion laceration to the dorsum of her right hand with exposed tendons. She does have full extensor strength as well as full sensation and good capillary refill. No evidence of arterial bleeding. There is a another puncture wound on the dorsal surface of the third metacarpal phalangeal joint.  Neurological: She is alert. No cranial nerve deficit. She exhibits normal muscle tone. Coordination  normal.  Skin: Skin is warm and dry.  Psychiatric: She has a normal mood and affect.  Nursing note and vitals reviewed.    ED Treatments / Results  Labs (all labs ordered are listed, but only abnormal results are displayed) Labs Reviewed - No data to display  EKG  EKG Interpretation None           Radiology Dg Hand Complete Right  Result Date: 01/17/2017 CLINICAL DATA:  Dog bite with extensive extensor skin damage. EXAM: RIGHT HAND - COMPLETE 3+ VIEW COMPARISON:  None. FINDINGS: No acute fracture or subluxation. Postsurgical change at the trapezium. 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 Soft tissue irregularity with skin defects noted about the dorsum of the metacarpals. No radiopaque foreign body. IMPRESSION: Soft tissue injury and laceration about the dorsum of the hand. No associated acute osseous abnormality. No radiopaque foreign body. Electronically Signed   By: Jeb Levering M.D.   On: 01/17/2017 22:50    Procedures .Marland KitchenLaceration Repair Date/Time: 01/18/2017 3:24 PM Performed by: Heriberto Antigua Authorized by: Gareth Morgan   Consent:    Consent obtained:  Verbal   Consent given by:  Patient   Risks discussed:   Infection, need for additional repair, poor cosmetic result, poor wound healing, tendon damage and vascular damage   Alternatives discussed:  No treatment Anesthesia (see MAR for exact dosages):    Anesthesia method:  Local infiltration   Local anesthetic:  Lidocaine 1% w/o epi Laceration details:    Location:  Hand   Hand location:  R hand, dorsum   Wound length (cm): large 6cm avulsion laceration. Repair type:    Repair type:  Simple Pre-procedure details:    Preparation:  Patient was prepped and draped in usual sterile fashion Exploration:    Wound exploration: wound explored through full range of motion and entire depth of wound probed and visualized     Wound extent: tendon damage     Tendon damage location:  Upper extremity   Tendon damage extent:  Partial transection   Tendon repair plan:  Refer for evaluation Treatment:    Area cleansed with:  Saline   Amount of cleaning:  Extensive   Irrigation solution:  Sterile saline   Irrigation method:  Syringe Skin repair:    Repair method:  Sutures   Suture size:  5-0   Suture material:  Nylon   Suture technique:  Simple interrupted   Number of sutures:  2 Approximation:    Approximation:  Loose (to allow for proper drainage given animal bite)   Vermilion border: poorly aligned   Post-procedure details:    Dressing:  Non-adherent dressing and adhesive bandage   Patient tolerance of procedure:  Tolerated well, no immediate complications   (including critical care time)  Medications Ordered in ED Medications  ceFAZolin (ANCEF) IVPB 2g/100 mL premix (0 g Intravenous Stopped 01/17/17 2350)  lidocaine (PF) (XYLOCAINE) 1 % injection 10 mL (10 mLs Intradermal Given 01/18/17 0020)     Initial Impression / Assessment and Plan / ED Course  I have reviewed the triage vital signs and the nursing notes.  Pertinent labs & imaging results that were available during my care of the patient were reviewed by me and considered in my medical  decision making (see chart for details).     Patient is a 66 year old female who presents after a dog bite to her right hand along the dorsal side. This occurred at 7:30 PM with family members pet  which is up-to-date on shots. She states she got her tetanus shot last year. She is right hand dominant. Further history and exam as above notable for extensive avulsion laceration to the extensor side with exposed tendons but she does have full extensor strength as well as sensation. X-ray obtained without any obvious fractures but Ancef was given. I spoke with hand surgery and recommended follow-up tomorrow in clinic. Given the extent of avulsion injury will loosely close the skin with 2 stitches. Patient has a penicillin allergy this will prescribe clindamycin and Cipro.  I have reviewed all imaging. Patient stable for discharge home.  I have reviewed all results with the patient. Advised to f/u tomorrow in hand clinic. Patient agrees to stated plan. All questions answered. Advised to call or return to have any questions, new symptoms, change in symptoms, or symptoms that they do not understand.  Final Clinical Impressions(s) / ED Diagnoses   Final diagnoses:  Dog bite, initial encounter  Laceration of right hand, foreign body presence unspecified, initial encounter    New Prescriptions Discharge Medication List as of 01/18/2017 12:21 AM    START taking these medications   Details  ciprofloxacin (CIPRO) 500 MG tablet Take 1 tablet (500 mg total) by mouth 2 (two) times daily., Starting Thu 01/18/2017, Until Thu 01/25/2017, Print    clindamycin (CLEOCIN) 150 MG capsule Take 3 capsules (450 mg total) by mouth 3 (three) times daily., Starting Thu 01/18/2017, Until Thu 01/25/2017, Print    oxyCODONE-acetaminophen (PERCOCET/ROXICET) 5-325 MG tablet Take 1 tablet by mouth every 8 (eight) hours as needed for severe pain., Starting Thu 01/18/2017, Print         Heriberto Antigua, MD 01/18/17 1526      Gareth Morgan, MD 01/19/17 1246

## 2017-01-18 MED ORDER — CIPROFLOXACIN HCL 500 MG PO TABS: 500.0000 mg | ORAL_TABLET | Freq: Two times a day (BID) | ORAL | 0 refills | Status: AC

## 2017-01-18 MED ORDER — CLINDAMYCIN HCL 150 MG PO CAPS: 450.0000 mg | ORAL_CAPSULE | Freq: Three times a day (TID) | ORAL | 0 refills | Status: AC

## 2017-01-18 MED ORDER — OXYCODONE-ACETAMINOPHEN 5-325 MG PO TABS: 1.0000 | ORAL_TABLET | Freq: Three times a day (TID) | ORAL | 0 refills | Status: DC | PRN

## 2017-01-19 ENCOUNTER — Encounter (HOSPITAL_COMMUNITY): Payer: Self-pay | Admitting: *Deleted

## 2017-01-19 NOTE — Progress Notes (Signed)
Mrs Batra denies chest pain or shortness of breath.  Patient does not see a cardiologist.  Dr Melford Aase is PCP, no EKG in pasit year.  Patient states she does not take medications with out food, but will take Diltiazem and Pepcid with a sip of water.

## 2017-01-19 NOTE — H&P (Signed)
Latoya [name] is an 66 y.o. female.   Chief Complaint: DOG BITE OF THE DORSAL ASPECT OF RIGHT HAND  HPI: Latoya [name] IS A 66 Y/O RIGHT HAND DOMINANT FEMALE WHO WAS BIT BY HER DAUGHTER'S DOG ON THE DORSAL ASPECT OF THE RIGHT HAND ON 01/17/17. SHE WAS EVALUATED IN THE EMERGENCY DEPARTMENT. SHE HAD AN IRRIGATION AND DEBRIDEMENT OF THE LACERATION AND IT WAS LOOSELY SUTURED. THE PATIENT WAS STARTED ON 2 ANTIBIOTICS WHICH SHE HAS BEEN TAKING AS DIRECTED.  Latoya [name] FOLLOWED UP IN OUR OFFICE FOR FURTHER EVALUATION. DISCUSSED THE DIFFICULTY WITH ANIMAL BITES AND THE NECESSITY OF STAYING AHEAD OF ANY TYPE OF INFECTION. REVIEWED THE REASON AND RATIONALE FOR SURGICAL INTERVENTION. DISCUSSED THE SURGICAL PROCEDURE, INCLUDING RISKS VERSUS BENEFITS, AND THE POST-OPERATIVE RECOVERY PROCESS.  THE PATIENT IS HERE TODAY FOR SURGERY.  Past Medical History:  Diagnosis Date  . Anxiety and depression   . Arthritis   . Colon polyps   . GERD (gastroesophageal reflux disease)   . Hemorrhoids   . Hiatal hernia   . History of kidney stones    passed  . Hyperlipemia   . Hypertension   . Kidney stones   . Mixed hyperlipidemia 11/26/2013  . Panic disorder   . Renal insufficiency    stage 3 kidney disease per her PCP  . Stomach ulcer   . Unspecified essential hypertension 11/26/2013    Past Surgical History:  Procedure Laterality Date  . ABDOMINAL HYSTERECTOMY    . CARPOMETACARPEL SUSPENSION PLASTY Right 12/08/2013   Procedure: CARPOMETACARPEL [facility]) SUSPENSION PLASTY;  Surgeon: Jolyn Nap, MD;  Location: [facility];  Service: Orthopedics;  Laterality: Right;  . COLONOSCOPY    . FOOT SURGERY Left 2008   Bunion and pulled tendons  . OOPHORECTOMY Right   . TUBAL LIGATION    . UPPER GASTROINTESTINAL ENDOSCOPY      Family History  Problem Relation Age of Onset  . Heart disease Brother        x2  . Stroke Brother   . Heart disease Father   . Asthma Mother   . Colon cancer Neg Hx   . Stomach  cancer Neg Hx   . Breast cancer Neg Hx    Social History:  reports that she is a non-smoker but has been exposed to tobacco smoke. She has never used smokeless tobacco. She reports that she does not drink alcohol or use drugs.  Allergies:  Allergies  Allergen Reactions  . Other     All pain medications: Unknown/ CODEINE  . Prednisone Other (See Comments)    DYSPHORIA  . Vioxx [Rofecoxib]   . Entex T [Pseudoephedrine-Guaifenesin] Palpitations  . Levaquin [Levofloxacin] Rash    Tolerated Avelox without adverse effect 11/08/15.  Marland Kitchen Penicillins Rash  . Sulfa Antibiotics Rash  . Trovan [Alatrofloxacin] Rash    No prescriptions prior to admission.    No results found for this or any previous visit (from the past 48 hour(s)). Dg Hand Complete Right  Result Date: 01/17/2017 CLINICAL DATA:  Dog bite with extensive extensor skin damage. EXAM: RIGHT HAND - COMPLETE 3+ VIEW COMPARISON:  None. FINDINGS: No acute fracture or subluxation. Postsurgical change at the trapezium. 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 Soft tissue irregularity with skin defects noted about the dorsum of the metacarpals. No radiopaque foreign body. IMPRESSION: Soft tissue injury and laceration about the dorsum of the hand. No associated acute osseous abnormality. No radiopaque foreign body. Electronically Signed  By: Jeb Levering M.D.   On: 01/17/2017 22:50    ROS NO RECENT ILLNESSES OR HOSPITALIZATIONS  There were no vitals taken for this visit. Physical Exam  General Appearance:  Alert, cooperative, no distress, appears stated age  Head:  Normocephalic, without obvious abnormality, atraumatic  Eyes:  Pupils equal, conjunctiva/corneas clear,         Throat: Lips, mucosa, and tongue normal; teeth and gums normal  Neck: No visible masses     Lungs:   respirations unlabored  Chest Wall:  No tenderness or deformity  Heart:  Regular rate and rhythm,  Abdomen:   Soft,  non-tender,         Extremities: RUE: LACERATION OF THE DORSAL ASPECT OF THE RIGHT HAND WITH MODERATE SWELLING. NO ERYTHEMATOUS STREAKING OR PURULENT DRAINAGE VISUALIZED. LOOSE SUTURES INTACT. TENDER THROUGHOUT THE HAND. SENSATION INTACT TO LIGHT TOUCH. CAPILLARY REFILL LESS THAN 2 SECONDS. ABLE TO WIGGLE ALL FINGERS WITH MILD DIFFICULTY.  Pulses: 2+ and symmetric  Skin: Skin color, texture, turgor normal, no rashes or lesions     Neurologic: Normal    Assessment DOG BITE OF THE DORSAL ASPECT OF RIGHT HAND,Concern for possible extensor tendon disruption as well  Plan RIGHT HAND IRRIGATION AND DEBRIDEMENT WITH REPAIR AS INDICATED  R/B/A DISCUSSED WITH PT IN OFFICE.  PT VOICED UNDERSTANDING OF PLAN CONSENT SIGNED DAY OF SURGERY PT SEEN AND EXAMINED PRIOR TO OPERATIVE PROCEDURE/DAY OF SURGERY SITE MARKED. QUESTIONS ANSWERED WILL GO HOME FOLLOWING SURGERY  WE ARE PLANNING SURGERY FOR YOUR UPPER EXTREMITY. THE RISKS AND BENEFITS OF SURGERY INCLUDE BUT NOT LIMITED TO BLEEDING INFECTION, DAMAGE TO NEARBY NERVES ARTERIES TENDONS, FAILURE OF SURGERY TO ACCOMPLISH ITS INTENDED GOALS, PERSISTENT SYMPTOMS AND NEED FOR FURTHER SURGICAL INTERVENTION. WITH THIS IN MIND WE WILL PROCEED. I HAVE DISCUSSED WITH THE PATIENT THE PRE AND POSTOPERATIVE REGIMEN AND THE DOS AND DON'TS. PT VOICED UNDERSTANDING AND INFORMED CONSENT SIGNED.     Brynda Peon 01/19/2017, 3:17 PM

## 2017-01-20 ENCOUNTER — Encounter (HOSPITAL_COMMUNITY)
Admission: RE | Disposition: A | Discharge status: Home / Self Care | Payer: Self-pay | Source: Ambulatory Visit | Attending: Orthopedic Surgery

## 2017-01-20 ENCOUNTER — Ambulatory Visit (HOSPITAL_COMMUNITY): Payer: PPO | Admitting: Anesthesiology

## 2017-01-20 ENCOUNTER — Ambulatory Visit (HOSPITAL_COMMUNITY)
Admission: RE | Admit: 2017-01-20 | Discharge: 2017-01-20 | Disposition: A | Discharge status: Home / Self Care | Payer: PPO | Source: Ambulatory Visit | Attending: Orthopedic Surgery | Admitting: Orthopedic Surgery

## 2017-01-20 ENCOUNTER — Encounter (HOSPITAL_COMMUNITY): Payer: Self-pay | Admitting: Anesthesiology

## 2017-01-20 DIAGNOSIS — Z881 Allergy status to other antibiotic agents status: Secondary | ICD-10-CM | POA: Diagnosis not present

## 2017-01-20 DIAGNOSIS — F329 Major depressive disorder, single episode, unspecified: Secondary | ICD-10-CM | POA: Diagnosis not present

## 2017-01-20 DIAGNOSIS — S62324B Displaced fracture of shaft of fourth metacarpal bone, right hand, initial encounter for open fracture: Secondary | ICD-10-CM | POA: Insufficient documentation

## 2017-01-20 DIAGNOSIS — Z79899 Other long term (current) drug therapy: Secondary | ICD-10-CM | POA: Insufficient documentation

## 2017-01-20 DIAGNOSIS — I10 Essential (primary) hypertension: Secondary | ICD-10-CM | POA: Insufficient documentation

## 2017-01-20 DIAGNOSIS — S61411A Laceration without foreign body of right hand, initial encounter: Secondary | ICD-10-CM | POA: Insufficient documentation

## 2017-01-20 DIAGNOSIS — Z888 Allergy status to other drugs, medicaments and biological substances status: Secondary | ICD-10-CM | POA: Insufficient documentation

## 2017-01-20 DIAGNOSIS — Z880 Allergy status to penicillin: Secondary | ICD-10-CM | POA: Insufficient documentation

## 2017-01-20 DIAGNOSIS — F419 Anxiety disorder, unspecified: Secondary | ICD-10-CM | POA: Diagnosis not present

## 2017-01-20 DIAGNOSIS — K219 Gastro-esophageal reflux disease without esophagitis: Secondary | ICD-10-CM | POA: Diagnosis not present

## 2017-01-20 DIAGNOSIS — Z7982 Long term (current) use of aspirin: Secondary | ICD-10-CM | POA: Diagnosis not present

## 2017-01-20 DIAGNOSIS — Z886 Allergy status to analgesic agent status: Secondary | ICD-10-CM | POA: Diagnosis not present

## 2017-01-20 DIAGNOSIS — Z6826 Body mass index (BMI) 26.0-26.9, adult: Secondary | ICD-10-CM | POA: Diagnosis not present

## 2017-01-20 DIAGNOSIS — W540XXA Bitten by dog, initial encounter: Secondary | ICD-10-CM | POA: Diagnosis not present

## 2017-01-20 DIAGNOSIS — E782 Mixed hyperlipidemia: Secondary | ICD-10-CM | POA: Insufficient documentation

## 2017-01-20 DIAGNOSIS — S61451A Open bite of right hand, initial encounter: Secondary | ICD-10-CM

## 2017-01-20 DIAGNOSIS — Z885 Allergy status to narcotic agent status: Secondary | ICD-10-CM | POA: Diagnosis not present

## 2017-01-20 DIAGNOSIS — N2889 Other specified disorders of kidney and ureter: Secondary | ICD-10-CM | POA: Diagnosis not present

## 2017-01-20 HISTORY — DX: Personal history of urinary calculi: Z87.442

## 2017-01-20 HISTORY — PX: I&D EXTREMITY: SHX5045

## 2017-01-20 LAB — BASIC METABOLIC PANEL
Anion gap: 8 (ref 5–15)
BUN: 9 mg/dL (ref 6–20)
CO2: 23 mmol/L (ref 22–32)
Calcium: 9.2 mg/dL (ref 8.9–10.3)
Chloride: 110 mmol/L (ref 101–111)
Creatinine, Ser: 0.98 mg/dL (ref 0.44–1.00)
GFR calc Af Amer: >60 mL/min (ref >60)
GFR calc non Af Amer: 59 mL/min — ABNORMAL LOW (ref >60)
Glucose, Bld: 99 mg/dL (ref 65–99)
Potassium: 3.4 mmol/L — ABNORMAL LOW (ref 3.5–5.1)
Sodium: 141 mmol/L (ref 135–145)

## 2017-01-20 LAB — CBC
HCT: 38.8 % (ref 36.0–46.0)
Hemoglobin: 13.9 g/dL (ref 12.0–15.0)
MCH: 29.6 pg (ref 26.0–34.0)
MCHC: 35.8 g/dL (ref 30.0–36.0)
MCV: 82.7 fL (ref 78.0–100.0)
Platelets: 182 10*3/uL (ref 150–400)
RBC: 4.69 MIL/uL (ref 3.87–5.11)
RDW: 13.8 % (ref 11.5–15.5)
WBC: 8.7 10*3/uL (ref 4.0–10.5)

## 2017-01-20 SURGERY — IRRIGATION AND DEBRIDEMENT EXTREMITY
Anesthesia: Monitor Anesthesia Care | Laterality: Right

## 2017-01-20 MED ORDER — FENTANYL CITRATE (PF) 250 MCG/5ML IJ SOLN
INTRAMUSCULAR | Status: AC
  Filled 2017-01-20: qty 5

## 2017-01-20 MED ORDER — FENTANYL CITRATE (PF) 100 MCG/2ML IJ SOLN: 25.0000 ug | INTRAMUSCULAR | Status: DC | PRN

## 2017-01-20 MED ORDER — MIDAZOLAM HCL 2 MG/2ML IJ SOLN
INTRAMUSCULAR | Status: AC
  Filled 2017-01-20: qty 2

## 2017-01-20 MED ORDER — LACTATED RINGERS IV SOLN
INTRAVENOUS | Status: DC | PRN
  Administered 2017-01-20: 09:00:00 via INTRAVENOUS

## 2017-01-20 MED ORDER — PROPOFOL 1000 MG/100ML IV EMUL
INTRAVENOUS | Status: AC
  Filled 2017-01-20: qty 100

## 2017-01-20 MED ORDER — OXYCODONE HCL 5 MG PO TABS: 5.0000 mg | ORAL_TABLET | Freq: Once | ORAL | Status: DC | PRN

## 2017-01-20 MED ORDER — SODIUM CHLORIDE 0.9 % IR SOLN
Status: DC | PRN
  Administered 2017-01-20: 1000 mL

## 2017-01-20 MED ORDER — BUPIVACAINE-EPINEPHRINE (PF) 0.5% -1:200000 IJ SOLN
INTRAMUSCULAR | Status: DC | PRN
  Administered 2017-01-20: 5 mL via PERINEURAL

## 2017-01-20 MED ORDER — CLINDAMYCIN PHOSPHATE 600 MG/50ML IV SOLN
600.0000 mg | INTRAVENOUS | Status: AC
  Administered 2017-01-20: 600 mg via INTRAVENOUS
  Filled 2017-01-20: qty 50

## 2017-01-20 MED ORDER — CHLORHEXIDINE GLUCONATE 4 % EX LIQD: 60.0000 mL | Freq: Once | CUTANEOUS | Status: DC

## 2017-01-20 MED ORDER — PROPOFOL 500 MG/50ML IV EMUL
INTRAVENOUS | Status: DC | PRN
  Administered 2017-01-20: 100 ug/kg/min via INTRAVENOUS

## 2017-01-20 MED ORDER — MIDAZOLAM HCL 5 MG/5ML IJ SOLN
INTRAMUSCULAR | Status: DC | PRN
  Administered 2017-01-20: 2 mg via INTRAVENOUS

## 2017-01-20 MED ORDER — FENTANYL CITRATE (PF) 100 MCG/2ML IJ SOLN
INTRAMUSCULAR | Status: DC | PRN
Start: 2017-01-20 — End: 2017-01-20
  Administered 2017-01-20: 50 ug via INTRAVENOUS

## 2017-01-20 MED ORDER — OXYCODONE HCL 5 MG/5ML PO SOLN: 5.0000 mg | Freq: Once | ORAL | Status: DC | PRN

## 2017-01-20 MED ORDER — PROPOFOL 10 MG/ML IV BOLUS
INTRAVENOUS | Status: AC
  Filled 2017-01-20: qty 20

## 2017-01-20 SURGICAL SUPPLY — 55 items
BANDAGE ACE 3X5.8 VEL STRL LF (GAUZE/BANDAGES/DRESSINGS) ×2 IMPLANT
BANDAGE ACE 4X5 VEL STRL LF (GAUZE/BANDAGES/DRESSINGS) ×2 IMPLANT
BNDG COHESIVE 1X5 TAN STRL LF (GAUZE/BANDAGES/DRESSINGS) IMPLANT
BNDG CONFORM 2 STRL LF (GAUZE/BANDAGES/DRESSINGS) IMPLANT
BNDG ESMARK 4X9 LF (GAUZE/BANDAGES/DRESSINGS) ×2 IMPLANT
BNDG GAUZE ELAST 4 BULKY (GAUZE/BANDAGES/DRESSINGS) ×2 IMPLANT
CORDS BIPOLAR (ELECTRODE) ×2 IMPLANT
COVER SURGICAL LIGHT HANDLE (MISCELLANEOUS) ×2 IMPLANT
CUFF TOURNIQUET SINGLE 18IN (TOURNIQUET CUFF) ×2 IMPLANT
CUFF TOURNIQUET SINGLE 24IN (TOURNIQUET CUFF) IMPLANT
DRAIN PENROSE 1/4X12 LTX STRL (WOUND CARE) IMPLANT
DRAPE SURG 17X23 STRL (DRAPES) ×2 IMPLANT
DRSG ADAPTIC 3X8 NADH LF (GAUZE/BANDAGES/DRESSINGS) ×2 IMPLANT
ELECT REM PT RETURN 9FT ADLT (ELECTROSURGICAL)
ELECTRODE REM PT RTRN 9FT ADLT (ELECTROSURGICAL) IMPLANT
GAUZE SPONGE 4X4 12PLY STRL (GAUZE/BANDAGES/DRESSINGS) ×2 IMPLANT
GAUZE XEROFORM 1X8 LF (GAUZE/BANDAGES/DRESSINGS) ×2 IMPLANT
GAUZE XEROFORM 5X9 LF (GAUZE/BANDAGES/DRESSINGS) IMPLANT
GLOVE BIOGEL PI IND STRL 8.5 (GLOVE) ×1 IMPLANT
GLOVE BIOGEL PI INDICATOR 8.5 (GLOVE) ×1
GLOVE SURG ORTHO 8.0 STRL STRW (GLOVE) ×2 IMPLANT
GOWN STRL REUS W/ TWL LRG LVL3 (GOWN DISPOSABLE) ×3 IMPLANT
GOWN STRL REUS W/ TWL XL LVL3 (GOWN DISPOSABLE) ×1 IMPLANT
GOWN STRL REUS W/TWL LRG LVL3 (GOWN DISPOSABLE) ×3
GOWN STRL REUS W/TWL XL LVL3 (GOWN DISPOSABLE) ×1
HANDPIECE INTERPULSE COAX TIP (DISPOSABLE)
KIT BASIN OR (CUSTOM PROCEDURE TRAY) ×2 IMPLANT
KIT ROOM TURNOVER OR (KITS) ×2 IMPLANT
MANIFOLD NEPTUNE II (INSTRUMENTS) ×2 IMPLANT
NEEDLE HYPO 25GX1X1/2 BEV (NEEDLE) IMPLANT
NS IRRIG 1000ML POUR BTL (IV SOLUTION) ×2 IMPLANT
PACK ORTHO EXTREMITY (CUSTOM PROCEDURE TRAY) ×2 IMPLANT
PAD ARMBOARD 7.5X6 YLW CONV (MISCELLANEOUS) ×4 IMPLANT
PAD CAST 4YDX4 CTTN HI CHSV (CAST SUPPLIES) ×1 IMPLANT
PADDING CAST COTTON 4X4 STRL (CAST SUPPLIES) ×1
SET HNDPC FAN SPRY TIP SCT (DISPOSABLE) IMPLANT
SOAP 2 % CHG 4 OZ (WOUND CARE) ×2 IMPLANT
SPLINT FIBERGLASS 3X35 (CAST SUPPLIES) ×2 IMPLANT
SPONGE LAP 18X18 X RAY DECT (DISPOSABLE) ×2 IMPLANT
SPONGE LAP 4X18 X RAY DECT (DISPOSABLE) ×2 IMPLANT
SUCTION FRAZIER HANDLE 10FR (MISCELLANEOUS) ×1
SUCTION TUBE FRAZIER 10FR DISP (MISCELLANEOUS) ×1 IMPLANT
SUT ETHILON 4 0 PS 2 18 (SUTURE) IMPLANT
SUT ETHILON 5 0 P 3 18 (SUTURE)
SUT NYLON ETHILON 5-0 P-3 1X18 (SUTURE) IMPLANT
SUT PROLENE 3 0 PS 2 (SUTURE) ×4 IMPLANT
SUT PROLENE 4 0 PS 2 18 (SUTURE) ×4 IMPLANT
SWAB CULTURE ESWAB REG 1ML (MISCELLANEOUS) IMPLANT
SYR CONTROL 10ML LL (SYRINGE) IMPLANT
TOWEL OR 17X24 6PK STRL BLUE (TOWEL DISPOSABLE) ×2 IMPLANT
TOWEL OR 17X26 10 PK STRL BLUE (TOWEL DISPOSABLE) ×2 IMPLANT
TUBE CONNECTING 12X1/4 (SUCTIONS) ×2 IMPLANT
UNDERPAD 30X30 (UNDERPADS AND DIAPERS) ×2 IMPLANT
WATER STERILE IRR 1000ML POUR (IV SOLUTION) ×2 IMPLANT
YANKAUER SUCT BULB TIP NO VENT (SUCTIONS) ×2 IMPLANT

## 2017-01-20 NOTE — Anesthesia Procedure Notes (Signed)
Anesthesia Regional Block: Supraclavicular block   Pre-Anesthetic Checklist: ,, timeout performed, Correct Patient, Correct Site, Correct Laterality, Correct Procedure, Correct Position, site marked, Risks and benefits discussed,  Surgical consent,  Pre-op evaluation,  At surgeon's request and post-op pain management  Laterality: Upper and Right  Prep: chloraprep       Needles:  Injection technique: Single-shot  Needle Type: Echogenic Needle          Additional Needles:   Procedures: ultrasound guided,,,,,,,,  Narrative:  Start time: 01/20/2017 9:38 AM End time: 01/20/2017 9:45 AM Injection made incrementally with aspirations every 5 mL.  Performed by: Personally   Additional Notes: H+P and labs reviewed, risks and benefits discussed with patient, procedure tolerated well without complications

## 2017-01-20 NOTE — Op Note (Signed)
PREOPERATIVE DIAGNOSIS: Right hand dog bite with 7-1/2 cm open wound over the dorsal of the hand  POSTOPERATIVE DIAGNOSIS: Same  ATTENDING SURGEON: Dr. Gavin Pound who was scrubbed and present for the entire procedure  ASSISTANT SURGEON: None  ANESTHESIA: Axillary block with IV sedation  OPERATIVE PROCEDURE: #1: Debridement of skin subcutaneous tissue muscle and bone excisional debridement right hand  2. Open treatment of right ring finger metacarpal shaft fracture without internal fixation   3. Complex wound closure 7. 5 cm dorsal of right hand traumatic hand laceration  IMPLANTS: None   RADIOGRAPHIC INTERPRETATION: not applicable   SURGICAL INDICATIONS: Mrs. Latoya [name] is a right-hand-dominant female who sustained the significant dog bite over the dorsal aspect of the right hand. Patient was seen and evaluated in the office and recommended undergo the above procedure. Risks benefits and alternatives discussed in detail the patient and signed informed consent was obtained risk including but not limited to bleeding infection damage to nearby nerves arteries or tendons loss of motion of the wrists and digits and need for further surgical intervention.   SURGICAL TECHNIQUE: and indicate correct upper site. Patient brought back Room placed supine on anesthesia and table general anesthesia with the IV sedation was administered. A well-padded tourniquet placed on the right brachium say with a 1000 drape. The right upper extremities and prepped and draped in normal sterile fashion. Timeout was called the correct site  was identified and the procedure begun. Previous sutures were then removed. The tourniquet insufflated. Excisional debridement of the skin subcutaneous tissue muscle and all the way down the bone was then carried out with sharp scissors scalpel and curettes. Debridement of the devitalized tissue was then carried out. The wound was thoroughly irrigated the patient did of the cortical fracture  to the ring finger metacarpal shaft not necessitating internal fixation. Open treatment of the metacarpal shaft fracture was done without internal fixation. After thorough wound irrigation excisional debridement of the devitalized tissue he had complex wound was then closed with 0 Prolene sutures. Adaptic dressing a sterile compression compressive bandage was then applied. Patient was then placed in well-padded volar splint and taken recovery room in good condition.   POSTOPERATIVE PLAN: patient be discharged to home seen back in the office in approximately 5 days for wound check. Be sent down to see her therapist for a volar splint and begin some gentle active range of motion. Plan to see her back in the 2 week mark for wound check and likely suture removal continue on oral pain medicine and antibiotics.

## 2017-01-20 NOTE — Transfer of Care (Signed)
Immediate Anesthesia Transfer of Care Note  Patient: Latoya [name]  Procedure(s) Performed: Procedure(s): RIGHT HAND IRRIGATION AND DEBRIDEMENT AND REPAIR AS INDICATED (Right)  Patient Location: PACU  Anesthesia Type:MAC combined with regional for post-op pain  Level of Consciousness: awake, alert  and oriented  Airway & Oxygen Therapy: Patient Spontanous Breathing and Patient connected to nasal cannula oxygen  Post-op Assessment: Report given to RN and Post -op Vital signs reviewed and stable  Post vital signs: Reviewed and stable  Last Vitals:  Vitals:   01/20/17 0830  BP: (!) 174/77  Pulse: 71  Resp: 20  Temp: 36.9 C    Last Pain:  Vitals:   01/20/17 0858  PainSc: 1       Patients Stated Pain Goal: 4 (27/74/12 8786)  Complications: No apparent anesthesia complications

## 2017-01-20 NOTE — Discharge Instructions (Signed)
KEEP BANDAGE CLEAN AND DRY CALL OFFICE FOR F/U APPT [phone] in 5 days Also make therapy appt in 5 days KEEP HAND ELEVATED ABOVE HEART OK TO APPLY ICE TO OPERATIVE AREA CONTACT OFFICE IF ANY WORSENING PAIN OR CONCERNS.

## 2017-01-20 NOTE — Anesthesia Preprocedure Evaluation (Signed)
Anesthesia Evaluation  Patient identified by MRN, date of birth, ID band Patient awake    Reviewed: Allergy & Precautions, H&P , NPO status , Patient's Chart, lab work & pertinent test results  History of Anesthesia Complications Negative for: history of anesthetic complications  Airway Mallampati: II  TM Distance: >3 FB Neck ROM: Full    Dental  (+) Caps, Dental Advisory Given, Missing   Pulmonary asthma ,    Pulmonary exam normal breath sounds clear to auscultation       Cardiovascular hypertension, Pt. on medications (-) angina+ Peripheral Vascular Disease  (-) Past MI and (-) CHF Normal cardiovascular exam Rhythm:Regular Rate:Normal     Neuro/Psych PSYCHIATRIC DISORDERS Depression  Neuromuscular disease negative neurological ROS     GI/Hepatic Neg liver ROS, hiatal hernia, PUD, GERD  Medicated and Controlled,  Endo/Other  negative endocrine ROSMorbid obesity  Renal/GU negative Renal ROS     Musculoskeletal  (+) Arthritis ,   Abdominal (+) + obese,   Peds  Hematology   Anesthesia Other Findings   Reproductive/Obstetrics                             Anesthesia Physical Anesthesia Plan  ASA: II  Anesthesia Plan: MAC and Regional   Post-op Pain Management:    Induction:   Airway Management Planned: Natural Airway, Nasal Cannula and Simple Face Mask  Additional Equipment: None  Intra-op Plan:   Post-operative Plan:   Informed Consent: I have reviewed the patients History and Physical, chart, labs and discussed the procedure including the risks, benefits and alternatives for the proposed anesthesia with the patient or authorized representative who has indicated his/her understanding and acceptance.   Dental advisory given  Plan Discussed with: CRNA and Surgeon  Anesthesia Plan Comments:         Anesthesia Quick Evaluation

## 2017-01-20 NOTE — Anesthesia Postprocedure Evaluation (Addendum)
Anesthesia Post Note  Patient: JOCILYNN GRADE  Procedure(s) Performed: Procedure(s) (LRB): RIGHT HAND IRRIGATION AND DEBRIDEMENT AND REPAIR AS INDICATED (Right)  Patient location during evaluation: PACU Anesthesia Type: Regional Level of consciousness: awake and alert Pain management: pain level controlled Vital Signs Assessment: post-procedure vital signs reviewed and stable Respiratory status: spontaneous breathing, nonlabored ventilation, respiratory function stable and patient connected to nasal cannula oxygen Cardiovascular status: stable and blood pressure returned to baseline Postop Assessment: no signs of nausea or vomiting Anesthetic complications: no       Last Vitals:  Vitals:   01/20/17 1055 01/20/17 1110  BP: (!) 142/76 (!) 150/68  Pulse: 86 80  Resp: 16 19  Temp:      Last Pain:  Vitals:   01/20/17 0858  PainSc: 1                  Sabir Charters

## 2017-01-20 NOTE — Anesthesia Procedure Notes (Signed)
Procedure Name: MAC Date/Time: 01/20/2017 9:58 AM Performed by: Kyung Rudd Pre-anesthesia Checklist: Patient identified, Emergency Drugs available, Suction available and Patient being monitored Patient Re-evaluated:Patient Re-evaluated prior to inductionOxygen Delivery Method: Simple face mask Intubation Type: IV induction Placement Confirmation: positive ETCO2

## 2017-01-21 ENCOUNTER — Encounter (HOSPITAL_COMMUNITY): Payer: Self-pay | Admitting: Orthopedic Surgery

## 2017-01-24 ENCOUNTER — Telehealth: Payer: Self-pay | Admitting: *Deleted

## 2017-01-24 NOTE — Telephone Encounter (Signed)
RX for [employer] faxed to [facility] due to yeast in her mouth, since on antibiotics for dog bite.A message was left to inform the patient.

## 2017-02-05 ENCOUNTER — Encounter (HOSPITAL_COMMUNITY): Payer: Self-pay | Admitting: Orthopedic Surgery

## 2017-02-05 NOTE — Addendum Note (Signed)
Addendum  created 02/05/17 1540 by Oleta Mouse, MD   Sign clinical note

## 2017-03-05 ENCOUNTER — Ambulatory Visit (INDEPENDENT_AMBULATORY_CARE_PROVIDER_SITE_OTHER): Payer: PPO | Admitting: Physician Assistant

## 2017-03-05 ENCOUNTER — Encounter: Payer: Self-pay | Admitting: Physician Assistant

## 2017-03-05 VITALS — BP 138/88 | HR 73 | Temp 97.5°F | Resp 14 | Ht 63.0 in

## 2017-03-05 DIAGNOSIS — J029 Acute pharyngitis, unspecified: Secondary | ICD-10-CM | POA: Diagnosis not present

## 2017-03-05 MED ORDER — AZITHROMYCIN 250 MG PO TABS
ORAL_TABLET | ORAL | 1 refills | Status: AC
Start: 2017-03-05 — End: 2017-03-10

## 2017-03-05 MED ORDER — DEXAMETHASONE SODIUM PHOSPHATE 100 MG/10ML IJ SOLN: 10.0000 mg | Freq: Once | INTRAMUSCULAR | Status: DC

## 2017-03-05 NOTE — Patient Instructions (Signed)
What Causes Tonsil Stones? Your tonsils are filled with nooks and crannies where bacteria and other materials, including dead cells and mucous, can become trapped. When this happens, the debris can become concentrated in white formations that occur in the pockets. Tonsil stones, or tonsilloliths, are formed when this trapped debris hardens, or calcifies. This tends to happen most often in people who have chronic inflammation in their tonsils or repeated bouts of tonsillitis. While many people have small tonsilloliths that develop in their tonsils, it is quite rare to have a large and solidified tonsil stone.  How Are Tonsil Stones Treated? The appropriate treatment for a tonsil stone depends on the size of the tonsillolith and its potential to cause discomfort or harm. Options include: No treatment. Many tonsil stones, especially ones that have no symptoms, require no special treatment. At-home removal. Some people choose to dislodge tonsil stones at home with the use of picks or swabs. Salt water gargles. Gargling with warm, salty water may help ease the discomfort of tonsillitis, which often accompanies tonsil stones. Allergy pills- Taking allergy pill every day can decrease the inflammation and decrease the formation of tonsil stones Continue reading below...  Antibiotics. Various antibiotics can be used to treat tonsil stones. While they may be helpful for some people, they cannot correct the basic problem that is causing tonsilloliths. Also, antibiotics can have side effects. Surgical removal. When tonsil stones are exceedingly large and symptomatic, it may be necessary for a surgeon to remove them. In certain instances, a doctor will be able to perform this relatively simple procedure using a local numbing agent. Then the patient will not need general anesthesia.  Can Tonsil Stones Be Prevented? Take an antihistamine daily can prevent inflammation and formation.  Since tonsil stones are more  common in people who have chronic tonsillitis, the only surefire way to prevent them is with surgical removal of the tonsils. This procedure, known as a tonsillectomy, removes the tissues of the tonsils entirely, thereby eliminating the possibility of tonsillolith formation, however, adults are more likely to have complications from the surgery than children so it is considered a last resort.    HOW TO TREAT VIRAL COUGH AND COLD SYMPTOMS:  -Symptoms usually last at least 1 week with the worst symptoms being around day 4.  - colds usually start with a sore throat and end with a cough, and the cough can take 2 weeks to get better.  -No antibiotics are needed for colds, flu, sore throats, cough, bronchitis UNLESS symptoms are longer than 7 days OR if you are getting better then get drastically worse.  -There are a lot of combination medications (Dayquil, Nyquil, Vicks 44, tyelnol cold and sinus, ETC). Please look at the ingredients on the back so that you are treating the correct symptoms and not doubling up on medications/ingredients.    Medicines you can use  Nasal congestion  - pseudoephedrine (Sudafed)- behind the counter, do not use if you have high blood pressure, medicine that have -D in them.  - phenylephrine (Sudafed PE) -Dextormethorphan + chlorpheniramine (Coridcidin HBP)- okay if you have high blood pressure -Oxymetazoline (Afrin) nasal spray- LIMIT to 3 days -Saline nasal spray -Neti pot (used distilled or bottled water)  Ear pain/congestion  -pseudoephedrine (sudafed) - Nasonex/flonase nasal spray  Fever  -Acetaminophen (Tyelnol) -Ibuprofen (Advil, motrin, aleve)  Sore Throat  -Acetaminophen (Tyelnol) -Ibuprofen (Advil, motrin, aleve) -Drink a lot of water -Gargle with salt water - Rest your voice (don't talk) -Throat sprays -Cough drops  Body Aches  -Acetaminophen (Tyelnol) -Ibuprofen (Advil, motrin, aleve)  Headache  -Acetaminophen (Tyelnol) -Ibuprofen (Advil,  motrin, aleve) - Exedrin, Exedrin Migraine  Allergy symptoms (cough, sneeze, runny nose, itchy eyes) -Claritin or loratadine cheapest but likely the weakest  -Zyrtec or certizine at night because it can make you sleepy -The strongest is allegra or fexafinadine  Cheapest at [facility], [employer], [employer]  Cough  -Dextromethorphan (Delsym)- medicine that has DM in it -Guafenesin (Mucinex/Robitussin) - cough drops - drink lots of water  Chest Congestion  -Guafenesin (Mucinex/Robitussin)  Red Itchy Eyes  - Naphcon-A  Upset Stomach  - Bland diet (nothing spicy, greasy, fried, and high acid foods like tomatoes, oranges, berries) -OKAY- cereal, bread, soup, crackers, rice -Eat smaller more frequent meals -reduce caffeine, no alcohol -Loperamide (Imodium-AD) if diarrhea -Prevacid for heart burn  General health when sick  -Hydration -wash your hands frequently -keep surfaces clean -change pillow cases and sheets often -Get fresh air but do not exercise strenuously -Vitamin D, double up on it - Vitamin C -Zinc

## 2017-03-05 NOTE — Progress Notes (Signed)
Subjective:    Patient ID: Latoya [name], female    DOB: November 02, 1950, 66 y.o.   MRN: [id number]  HPI 66 y.o. WF presents with sore throat x Wednesday, on mucinex. Had had sore throat, sinus issues/congestion, chills but no fever, right ear pain. No SOB, no CP.   Blood pressure 138/88, pulse 73, temperature 97.5 F (36.4 C), resp. rate 14, height 5\' 3"  (1.6 m), SpO2 95 %.  Medications Current Outpatient Prescriptions on File Prior to Visit  Medication Sig  . aspirin 81 MG tablet Take 81 mg by mouth at bedtime.   . Biotin 1000 MCG tablet Take 1,000 mcg by mouth 3 (three) times daily.  . Cholecalciferol (VITAMIN D3) 5000 UNITS CAPS Take 5,000 Units by mouth daily.   . clonazePAM (KLONOPIN) 1 MG tablet Take 1 mg by mouth 2 (two) times daily as needed for anxiety.   . diclofenac sodium (VOLTAREN) 1 % GEL Apply 4 g topically 4 (four) times daily. To affected area (Patient not taking: Reported on 01/19/2017)  . diltiazem (CARDIZEM) 120 MG tablet Take 120 mg by mouth 2 (two) times daily.   . famotidine (PEPCID) 20 MG tablet Take 20 mg by mouth daily.  . furosemide (LASIX) 20 MG tablet TAKE 1 TABLET BY MOUTH EVERY DAY (Patient taking differently: TAKE 1 TABLET BY MOUTH AS NEEDED FOR SWELLING)  . oxyCODONE-acetaminophen (PERCOCET/ROXICET) 5-325 MG tablet Take 1 tablet by mouth every 8 (eight) hours as needed for severe pain.  . potassium chloride SA (K-DUR,KLOR-CON) 20 MEQ tablet TAKE 1 TABLET BY MOUTH TWICE DAILY. (Patient taking differently: TAKE 1 TABLET BY MOUTH AS NEEDED WITH LASIX)  . pravastatin (PRAVACHOL) 40 MG tablet TAKE 1 TABLET BY MOUTH EVERY NIGHT AT BEDTIME FOR CHOLESTEROL  . Probiotic Product (PROBIOTIC ACIDOPHILUS) CAPS Take 1 capsule by mouth 2 (two) times daily.   . sertraline (ZOLOFT) 100 MG tablet Take 50 mg by mouth daily.    No current facility-administered medications on file prior to visit.     Problem list She has Esophageal reflux; Pain of upper abdomen; Generalized  anxiety disorder; Essential hypertension; Mixed hyperlipidemia; Other abnormal glucose; Vitamin D deficiency; Medication management; H/O hiatal hernia; Thyroid nodule; Encounter for Medicare annual wellness exam; Aortic atherosclerosis ([place]); CKD (chronic kidney disease) stage 3, GFR 30-59 ml/min; Hiatal hernia; and Chronic rhinitis on her problem list.  Review of Systems  Constitutional: Positive for chills. Negative for diaphoresis.  HENT: Positive for congestion, ear pain, sinus pressure and sore throat. Negative for sneezing.   Respiratory: Negative.  Negative for cough and shortness of breath.   Cardiovascular: Negative.   Musculoskeletal: Positive for neck pain.  Neurological: Negative.  Negative for headaches.       Objective:   Physical Exam  Constitutional: She appears well-developed and well-nourished.  HENT:  Head: Normocephalic and atraumatic.  Right Ear: External ear normal.  Left Ear: External ear normal.  Mouth/Throat: Uvula is midline and mucous membranes are normal. Posterior oropharyngeal edema and posterior oropharyngeal erythema present.  Eyes: Conjunctivae and EOM are normal. Pupils are equal, round, and reactive to light.  Neck: Normal range of motion. Neck supple.  Cardiovascular: Normal rate, regular rhythm and normal heart sounds.   Pulmonary/Chest: Effort normal and breath sounds normal.  Abdominal: Soft. Bowel sounds are normal.  Lymphadenopathy:    She has no cervical adenopathy.  Skin: Skin is warm and dry.       Assessment & Plan:   Sore throat Will hold the zpak  and take if she is not getting better, increase fluids, rest, cont allergy pill - dexamethasone (DECADRON) injection 10 mg; Inject 1 mL (10 mg total) into the muscle once. - azithromycin (ZITHROMAX) 250 MG tablet; Take 2 tablets (500 mg) on  Day 1,  followed by 1 tablet (250 mg) once daily on Days 2 through 5.  Dispense: 6 each; Refill: 1

## 2017-03-22 ENCOUNTER — Encounter (HOSPITAL_COMMUNITY): Payer: Self-pay

## 2017-03-22 ENCOUNTER — Emergency Department (HOSPITAL_COMMUNITY)
Admission: EM | Admit: 2017-03-22 | Discharge: 2017-03-22 | Disposition: A | Discharge status: Home / Self Care | Payer: Self-pay | Attending: Emergency Medicine | Admitting: Emergency Medicine

## 2017-03-22 ENCOUNTER — Emergency Department (HOSPITAL_COMMUNITY): Payer: Self-pay

## 2017-03-22 DIAGNOSIS — Y939 Activity, unspecified: Secondary | ICD-10-CM | POA: Insufficient documentation

## 2017-03-22 DIAGNOSIS — Z79899 Other long term (current) drug therapy: Secondary | ICD-10-CM | POA: Insufficient documentation

## 2017-03-22 DIAGNOSIS — I10 Essential (primary) hypertension: Secondary | ICD-10-CM | POA: Insufficient documentation

## 2017-03-22 DIAGNOSIS — W540XXA Bitten by dog, initial encounter: Secondary | ICD-10-CM | POA: Insufficient documentation

## 2017-03-22 DIAGNOSIS — Z23 Encounter for immunization: Secondary | ICD-10-CM | POA: Insufficient documentation

## 2017-03-22 DIAGNOSIS — Y929 Unspecified place or not applicable: Secondary | ICD-10-CM | POA: Insufficient documentation

## 2017-03-22 DIAGNOSIS — Y999 Unspecified external cause status: Secondary | ICD-10-CM | POA: Insufficient documentation

## 2017-03-22 DIAGNOSIS — S61411A Laceration without foreign body of right hand, initial encounter: Secondary | ICD-10-CM | POA: Insufficient documentation

## 2017-03-22 MED ORDER — TETANUS-DIPHTH-ACELL PERTUSSIS 5-2.5-18.5 LF-MCG/0.5 IM SUSP
0.5000 mL | Freq: Once | INTRAMUSCULAR | Status: AC
  Administered 2017-03-22: 0.5 mL via INTRAMUSCULAR
  Filled 2017-03-22: qty 0.5

## 2017-03-22 MED ORDER — AMOXICILLIN-POT CLAVULANATE 875-125 MG PO TABS: 1.0000 | ORAL_TABLET | Freq: Two times a day (BID) | ORAL | 0 refills | Status: DC

## 2017-03-22 MED ORDER — OXYCODONE-ACETAMINOPHEN 5-325 MG PO TABS
1.0000 | ORAL_TABLET | Freq: Once | ORAL | Status: AC
  Administered 2017-03-22: 1 via ORAL
  Filled 2017-03-22: qty 1

## 2017-03-22 MED ORDER — OXYCODONE-ACETAMINOPHEN 5-325 MG PO TABS: 2.0000 | ORAL_TABLET | ORAL | 0 refills | Status: DC | PRN

## 2017-03-22 MED ORDER — AMOXICILLIN-POT CLAVULANATE 875-125 MG PO TABS
1.0000 | ORAL_TABLET | Freq: Once | ORAL | Status: AC
  Administered 2017-03-22: 1 via ORAL
  Filled 2017-03-22: qty 1

## 2017-03-22 NOTE — ED Provider Notes (Signed)
[facility] DEPT Provider Note   CSN: [id number] Arrival date & time: 03/22/17  2044     History   Chief Complaint Chief Complaint  Patient presents with  . Animal Bite    HPI Latoya [name] is a 66 y.o. female who presents to the emergency department with a chief complaint of dog bite to the right hand that occurred this evening. She reports that she has spoken with animal control and the dog's vaccinations are up-to-date. She is unsure when her tetanus was last updated. She states that she was bitten on the right hand by another dog and May and required debridement in the OR with Dr. Caralyn Guile. She reports that the wound has been healing since the operation, and she has been compliant with physical therapy. No other complaints at this time. No treatments prior to arrival. She denies fever or chills. No weakness or numbness to the right hand. She is right-hand dominant.  The history is provided by the patient. No language interpreter was used.    Past Medical History:  Diagnosis Date  . Anxiety and depression   . Arthritis   . Colon polyps   . GERD (gastroesophageal reflux disease)   . Hemorrhoids   . Hiatal hernia   . History of kidney stones    passed  . Hyperlipemia   . Hypertension   . Kidney stones   . Mixed hyperlipidemia 11/26/2013  . Panic disorder   . Renal insufficiency    stage 3 kidney disease per her PCP  . Stomach ulcer   . Unspecified essential hypertension 11/26/2013    Patient Active Problem List   Diagnosis Date Noted  . Chronic rhinitis 10/09/2016  . Hiatal hernia 07/14/2016  . Encounter for Medicare annual wellness exam 06/30/2016  . Aortic atherosclerosis ([place]) 06/30/2016  . CKD (chronic kidney disease) stage 3, GFR 30-59 ml/min 06/30/2016  . H/O hiatal hernia 02/29/2016  . Thyroid nodule 12/24/2015  . Other abnormal glucose 05/01/2014  . Vitamin D deficiency 05/01/2014  . Medication management 05/01/2014  . Essential hypertension 11/26/2013    . Mixed hyperlipidemia 11/26/2013  . Generalized anxiety disorder 07/11/2013  . Esophageal reflux 11/29/2012  . Pain of upper abdomen 11/29/2012    Past Surgical History:  Procedure Laterality Date  . ABDOMINAL HYSTERECTOMY    . CARPOMETACARPEL SUSPENSION PLASTY Right 12/08/2013   Procedure: CARPOMETACARPEL [facility]) SUSPENSION PLASTY;  Surgeon: Jolyn Nap, MD;  Location: [facility];  Service: Orthopedics;  Laterality: Right;  . COLONOSCOPY    . FOOT SURGERY Left 2008   Bunion and pulled tendons  . I&D EXTREMITY Right 01/20/2017   Procedure: RIGHT HAND IRRIGATION AND DEBRIDEMENT AND REPAIR AS INDICATED;  Surgeon: Iran Planas, MD;  Location: [facility];  Service: Orthopedics;  Laterality: Right;  . OOPHORECTOMY Right   . TUBAL LIGATION    . UPPER GASTROINTESTINAL ENDOSCOPY      OB History    No data available       Home Medications    Prior to Admission medications   Medication Sig Start Date End Date Taking? Authorizing Provider  amoxicillin-clavulanate (AUGMENTIN) 875-125 MG tablet Take 1 tablet by mouth 2 (two) times daily. 03/22/17   Rilee Knoll A, PA-C  aspirin 81 MG tablet Take 81 mg by mouth at bedtime.     [provider]  Biotin 1000 MCG tablet Take 1,000 mcg by mouth 3 (three) times daily.    [provider]  Cholecalciferol (VITAMIN D3) 5000 UNITS  CAPS Take 5,000 Units by mouth daily.     [provider]  clonazePAM (KLONOPIN) 1 MG tablet Take 1 mg by mouth 2 (two) times daily as needed for anxiety.  05/31/15   [provider]  diclofenac sodium (VOLTAREN) 1 % GEL Apply 4 g topically 4 (four) times daily. To affected area Patient not taking: Reported on 01/19/2017 01/02/17   Gatha Mayer, MD  diltiazem (CARDIZEM) 120 MG tablet Take 120 mg by mouth 2 (two) times daily.     [provider]  famotidine (PEPCID) 20 MG tablet Take 20 mg by mouth daily.    [provider]  furosemide (LASIX) 20 MG tablet  TAKE 1 TABLET BY MOUTH EVERY DAY Patient taking differently: TAKE 1 TABLET BY MOUTH AS NEEDED FOR SWELLING 09/06/16   Unk Pinto, MD  oxyCODONE-acetaminophen (PERCOCET/ROXICET) 5-325 MG tablet Take 2 tablets by mouth every 4 (four) hours as needed for severe pain. 03/22/17   Crandall Harvel A, PA-C  potassium chloride SA (K-DUR,KLOR-CON) 20 MEQ tablet TAKE 1 TABLET BY MOUTH TWICE DAILY. Patient taking differently: TAKE 1 TABLET BY MOUTH AS NEEDED WITH LASIX 09/06/16   Unk Pinto, MD  pravastatin (PRAVACHOL) 40 MG tablet TAKE 1 TABLET BY MOUTH EVERY NIGHT AT BEDTIME FOR CHOLESTEROL 06/07/14   Unk Pinto, MD  Probiotic Product (PROBIOTIC ACIDOPHILUS) CAPS Take 1 capsule by mouth 2 (two) times daily.     [provider]  sertraline (ZOLOFT) 100 MG tablet Take 50 mg by mouth daily.  05/31/15   [provider]    Family History Family History  Problem Relation Age of Onset  . Heart disease Brother        x2  . Stroke Brother   . Heart disease Father   . Asthma Mother   . Colon cancer Neg Hx   . Stomach cancer Neg Hx   . Breast cancer Neg Hx     Social History Social History  Substance Use Topics  . Smoking status: Passive Smoke Exposure - Never Smoker  . Smokeless tobacco: Never Used     Comment: Husband & Father smoked  . Alcohol use No     Allergies   Other; Prednisone; Vioxx [rofecoxib]; Entex t [pseudoephedrine-guaifenesin]; Levaquin [levofloxacin]; Penicillins; Sulfa antibiotics; and Trovan [alatrofloxacin]   Review of Systems Review of Systems  Constitutional: Negative for chills and fever.  Skin: Positive for wound (dog bite).  Neurological: Negative for weakness and numbness.     Physical Exam Updated Vital Signs BP (!) 149/80 (BP Location: Left Arm)   Pulse 72   Temp 99.7 F (37.6 C) (Oral)   Resp 20   Wt 68 kg (150 lb)   SpO2 99%   BMI 26.57 kg/m   Physical Exam  Constitutional: No distress.  HENT:  Head: Normocephalic.    Eyes: Conjunctivae are normal.  Neck: Neck supple.  Cardiovascular: Normal rate and regular rhythm.  Exam reveals no gallop and no friction rub.   No murmur heard. Pulmonary/Chest: Effort normal. No respiratory distress.  Abdominal: Soft. She exhibits no distension.  Musculoskeletal:  There is a 1 cm hemostatic laceration to the lateral/ulnar aspect of the palmar surface of the right hand. There is a straight, hemostatic 1 cm laceration to the medial/radial aspect of the right hand with a small, hemostatic puncture wound immediately proximal with surrounding ecchymosis. Full range of motion of the right hand. Radial pulses are 2+ bilaterally. Sensation is intact throughout good strength against resistance of the  hand and all digits. No surrounding warmth, erythema, or edema.  Neurological: She is alert.  Skin: Skin is warm. No rash noted.  Psychiatric: Her behavior is normal.  Nursing note and vitals reviewed.    ED Treatments / Results  Labs (all labs ordered are listed, but only abnormal results are displayed) Labs Reviewed - No data to display  EKG  EKG Interpretation None       Radiology Dg Hand Complete Right  Result Date: 03/22/2017 CLINICAL DATA:  Dog bite EXAM: RIGHT HAND - COMPLETE 3+ VIEW COMPARISON:  Right hand radiographs 01/17/2017 FINDINGS: The bones are osteopenic. Unchanged degenerative findings of the carpometacarpal joints. There is soft tissue laceration most evident at the medial surface of the hand. No radiopaque foreign body. No acute osseous injury. IMPRESSION: Soft tissue laceration at the hypothenar eminence. No acute osseous abnormality or radiopaque foreign body. Electronically Signed   By: Ulyses Jarred M.D.   On: 03/22/2017 22:44    Procedures Procedures (including critical care time)  Medications Ordered in ED Medications  Tdap (BOOSTRIX) injection 0.5 mL (0.5 mLs Intramuscular Given 03/22/17 2244)  oxyCODONE-acetaminophen (PERCOCET/ROXICET) 5-325  MG per tablet 1 tablet (1 tablet Oral Given 03/22/17 2244)  amoxicillin-clavulanate (AUGMENTIN) 875-125 MG per tablet 1 tablet (1 tablet Oral Given 03/22/17 2244)     Initial Impression / Assessment and Plan / ED Course  I have reviewed the triage vital signs and the nursing notes.  Pertinent labs & imaging results that were available during my care of the patient were reviewed by me and considered in my medical decision making (see chart for details).     Patient presents with laceration from a dog bite.  Pt wounds irrigated well with 18ga angiocath with sterile saline.  Wounds examined with visualization of the base and no foreign bodies seen.  Pt Alert and oriented, NAD, nontoxic, nonseptic appearing.  Capillary refill intact and pt without neurologic deficit. X-rays with no fracture or radiolucent foreign body. Patient tetanus updated. Pt consents. Pain treated in the emergency department. Wounds not closed secondary to concern for infection. Will discharge home with pain medication, Augmentin and pain medication. A 85-month prescription history query was performed using the [place] CSRS prior to discharge. Strict return precautions given. No acute distress. The patient is safe and stable for discharge at this time.  Final Clinical Impressions(s) / ED Diagnoses   Final diagnoses:  Dog bite, initial encounter    New Prescriptions New Prescriptions   AMOXICILLIN-CLAVULANATE (AUGMENTIN) 875-125 MG TABLET    Take 1 tablet by mouth 2 (two) times daily.   OXYCODONE-ACETAMINOPHEN (PERCOCET/ROXICET) 5-325 MG TABLET    Take 2 tablets by mouth every 4 (four) hours as needed for severe pain.     Joanne Gavel, PA-C 03/22/17 2344    Davonna Belling, MD 03/23/17 [phone]

## 2017-03-22 NOTE — Discharge Instructions (Signed)
Please take Augmentin twice daily for the next 5 days. Please keep the wound clean with warm soap and water. Change your dressing daily. Please keep it covered with a fresh bandage every day. You can apply topical antibiotic ointment like bacitracin or Neosporin directly to the scan before applying the bandage. If you develop any signs of infection, which includes fever, chills, or if the wound becomes red, hot, or swollen, please return to the emergency department for reevaluation. Please do not drive after taking Percocet because it can make you sleepy.

## 2017-03-27 ENCOUNTER — Ambulatory Visit: Payer: PPO | Admitting: Podiatry

## 2017-03-30 ENCOUNTER — Ambulatory Visit (INDEPENDENT_AMBULATORY_CARE_PROVIDER_SITE_OTHER): Payer: PPO | Admitting: Physician Assistant

## 2017-03-30 ENCOUNTER — Encounter: Payer: Self-pay | Admitting: Physician Assistant

## 2017-03-30 VITALS — BP 130/90 | HR 94 | Temp 97.9°F | Resp 16 | Ht 63.0 in | Wt 145.8 lb

## 2017-03-30 DIAGNOSIS — N6489 Other specified disorders of breast: Secondary | ICD-10-CM | POA: Diagnosis not present

## 2017-03-30 MED ORDER — CEPHALEXIN 500 MG PO CAPS: 500.0000 mg | ORAL_CAPSULE | Freq: Three times a day (TID) | ORAL | 0 refills | Status: AC

## 2017-03-30 NOTE — Progress Notes (Signed)
Subjective:    Patient ID: Latoya [name], female    DOB: 13-Jun-1951, 66 y.o.   MRN: [id number]  HPI 66 y.o. WF presents with right nipple tenderness.  She has white spot/blister on right nipple, very tender, no discharge, it is tender, no fever or chills.  She states she is going through a lot of family stuff, she increased her zoloft to 100mg  today.  Last MGM 11/2015, no fm hx of breast cancer.   Blood pressure 130/90, pulse 94, temperature 97.9 F (36.6 C), resp. rate 16, height 5\' 3"  (1.6 m), weight 145 lb 12.8 oz (66.1 kg), SpO2 97 %.  Medications Current Outpatient Prescriptions on File Prior to Visit  Medication Sig  . aspirin 81 MG tablet Take 81 mg by mouth at bedtime.   . Biotin 1000 MCG tablet Take 1,000 mcg by mouth 3 (three) times daily.  . Cholecalciferol (VITAMIN D3) 5000 UNITS CAPS Take 5,000 Units by mouth daily.   . clonazePAM (KLONOPIN) 1 MG tablet Take 1 mg by mouth 2 (two) times daily as needed for anxiety.   . diclofenac sodium (VOLTAREN) 1 % GEL Apply 4 g topically 4 (four) times daily. To affected area  . diltiazem (CARDIZEM) 120 MG tablet Take 120 mg by mouth 2 (two) times daily.   . famotidine (PEPCID) 20 MG tablet Take 20 mg by mouth daily.  . furosemide (LASIX) 20 MG tablet TAKE 1 TABLET BY MOUTH EVERY DAY (Patient taking differently: TAKE 1 TABLET BY MOUTH AS NEEDED FOR SWELLING)  . oxyCODONE-acetaminophen (PERCOCET/ROXICET) 5-325 MG tablet Take 2 tablets by mouth every 4 (four) hours as needed for severe pain.  . potassium chloride SA (K-DUR,KLOR-CON) 20 MEQ tablet TAKE 1 TABLET BY MOUTH TWICE DAILY. (Patient taking differently: TAKE 1 TABLET BY MOUTH AS NEEDED WITH LASIX)  . pravastatin (PRAVACHOL) 40 MG tablet TAKE 1 TABLET BY MOUTH EVERY NIGHT AT BEDTIME FOR CHOLESTEROL  . Probiotic Product (PROBIOTIC ACIDOPHILUS) CAPS Take 1 capsule by mouth 2 (two) times daily.   . sertraline (ZOLOFT) 100 MG tablet Take 50 mg by mouth daily.    Current  Facility-Administered Medications on File Prior to Visit  Medication  . dexamethasone (DECADRON) injection 10 mg    Problem list She has Esophageal reflux; Pain of upper abdomen; Generalized anxiety disorder; Essential hypertension; Mixed hyperlipidemia; Other abnormal glucose; Vitamin D deficiency; Medication management; H/O hiatal hernia; Thyroid nodule; Encounter for Medicare annual wellness exam; Aortic atherosclerosis ([place]); CKD (chronic kidney disease) stage 3, GFR 30-59 ml/min; Hiatal hernia; and Chronic rhinitis on her problem list.   Review of Systems  Constitutional: Negative.  Negative for chills and fever.  HENT: Negative.   Respiratory: Negative.   Cardiovascular: Negative.   Gastrointestinal: Negative.  Negative for diarrhea.  Genitourinary: Negative.   Musculoskeletal: Negative.  Negative for arthralgias.  Skin: Positive for color change (breast) and rash. Negative for pallor and wound.  Neurological: Negative.  Negative for dizziness.  Hematological: Negative.  Negative for adenopathy. Does not bruise/bleed easily.  Psychiatric/Behavioral: The patient is nervous/anxious.        Objective:   Physical Exam  Constitutional: She is oriented to person, place, and time. She appears well-developed and well-nourished.  HENT:  Head: Normocephalic and atraumatic.  Right Ear: External ear normal.  Left Ear: External ear normal.  Mouth/Throat: Oropharynx is clear and moist.  Eyes: Pupils are equal, round, and reactive to light. Conjunctivae and EOM are normal.  Neck: Normal range of motion. Neck supple. No  thyromegaly present.  Cardiovascular: Normal rate, regular rhythm and normal heart sounds.  Exam reveals no gallop and no friction rub.   No murmur heard. Pulmonary/Chest: Effort normal and breath sounds normal. No respiratory distress. She has no wheezes.    Abdominal: Soft. Bowel sounds are normal. She exhibits no distension and no mass. There is no tenderness. There is  no rebound and no guarding.  Musculoskeletal: Normal range of motion.  Right handed wrapped from previous dog bite  Lymphadenopathy:    She has no cervical adenopathy.  Neurological: She is alert and oriented to person, place, and time. She displays normal reflexes. No cranial nerve deficit. Coordination normal.  Skin: Skin is warm and dry.  Psychiatric: She has a normal mood and affect.       Assessment & Plan:   Nipple crusting -     cephALEXin (KEFLEX) 500 MG capsule; Take 1 capsule (500 mg total) by mouth 3 (three) times daily. -     US BREAST LTD UNI RIGHT INC AXILLA; Future -     MM DIAG BREAST TOMO UNI RIGHT; Future

## 2017-04-10 ENCOUNTER — Other Ambulatory Visit: Payer: Self-pay | Admitting: Physician Assistant

## 2017-04-10 DIAGNOSIS — R921 Mammographic calcification found on diagnostic imaging of breast: Secondary | ICD-10-CM

## 2017-04-10 DIAGNOSIS — N6489 Other specified disorders of breast: Secondary | ICD-10-CM

## 2017-04-19 ENCOUNTER — Telehealth: Payer: Self-pay | Admitting: Pulmonary Disease

## 2017-04-19 NOTE — Telephone Encounter (Signed)
Attempted to contact pt. No answer, no option to leave a message. Will try back.  

## 2017-04-20 NOTE — Telephone Encounter (Signed)
I have attempted to contact pt and received recording stating my call could not go through.  Will call back.

## 2017-04-20 NOTE — Telephone Encounter (Signed)
No plan for PFT according to my note.

## 2017-04-20 NOTE — Telephone Encounter (Signed)
Pt is calling about scheduling a PFT.  I do not see in the last OV note mention of this.  JN please advise if you want the pt to have a PFT done.  She does not have a pending appt with you. Thanks

## 2017-04-23 NOTE — Telephone Encounter (Signed)
Pt is aware of JN's below message and voiced her understanding.  Nothing further needed.

## 2017-04-24 NOTE — Progress Notes (Signed)
CPE  Assessment and Plan:    Essential hypertension - continue medications, DASH diet, exercise and monitor at home. Call if greater than 130/80.  -     CBC with Differential/Platelet -     Hepatic function panel -     BASIC METABOLIC PANEL WITH GFR -     TSH  Aortic atherosclerosis (HCC) Control blood pressure, cholesterol, glucose, increase exercise.   Thyroid nodule Hypothyroidism-check TSH level, continue medications the same, reminded to take on an empty stomach 30-16mins before food.  -     TSH -     US SOFT TISSUE HEAD AND NECK; Future  CKD (chronic kidney disease) stage 3, GFR 30-59 ml/min Increase fluids, avoid NSAIDS, monitor sugars, will monitor  Mixed hyperlipidemia -continue medications, check lipids, decrease fatty foods, increase activity.  -     Lipid panel  Other abnormal glucose Discussed general issues about diabetes pathophysiology and management., Educational material distributed., Suggested low cholesterol diet., Encouraged aerobic exercise., Discussed foot care., Reminded to get yearly retinal exam.  Medication management -     Magnesium  Hiatal hernia Get on pepcid x 2 weeks  Chronic rhinitis - Allegra OTC, increase H20, allergy hygiene explained.  Gastroesophageal reflux disease, esophagitis presence not specified Continue PPI/H2 blocker, diet discussed  Generalized anxiety disorder continue medications, stress management techniques discussed, increase water, good sleep hygiene discussed, increase exercise, and increase veggies.   Vitamin D deficiency  Encounter for general adult medical examination with abnormal findings Repeat 1 year  Over 40 minutes of exam, counseling, chart review and critical decision making was performed Future Appointments Date Time Provider [place]  06/01/2017 8:10 AM [facility] DIAG TOMO 2 [facility]  06/01/2017 8:20 AM [facility] [place] 2 [facility] CE  07/30/2017 11:30 AM [name],  PA-C [facility] None     Subjective:  Latoya [name] is a 66 y.o. female who presents for CPE and follow up for HTN, chol, depression.   Her blood pressure has been controlled at home, today their BP is BP: 138/78  She does workout, walking, yard work, house work. She denies chest pain, shortness of breath, dizziness.  She is on cholesterol medication and denies myalgias. Her cholesterol is not at goal. The cholesterol last visit was:   Lab Results  Component Value Date   CHOL 179 12/20/2016   HDL 49 (L) 12/20/2016   LDLCALC 88 12/20/2016   TRIG 210 (H) 12/20/2016   CHOLHDL 3.7 12/20/2016   She is on zoloft and on klonopin rarely for anxiety/depression.   She has been working on diet and exercise for prediabetes, and denies paresthesia of the feet, polydipsia, polyuria and visual disturbances. Last A1C in the office was:  Lab Results  Component Value Date   HGBA1C 5.6 06/30/2016  Last GFR: Lab Results  Component Value Date   GFRNONAA 59 (L) 01/20/2017  Patient is on Vitamin D supplement.   Lab Results  Component Value Date   VD25OH 61 06/30/2016     BMI is Body mass index is 25.4 kg/m., she is working on diet and exercise. Wt Readings from Last 3 Encounters:  04/26/17 143 lb 6.4 oz (65 kg)  03/30/17 145 lb 12.8 oz (66.1 kg)  03/22/17 150 lb (68 kg)     Medication Review: Current Outpatient Prescriptions on File Prior to Visit  Medication Sig  . aspirin 81 MG tablet Take 81 mg by mouth at bedtime.   . Biotin 1000 MCG tablet Take 1,000  mcg by mouth 3 (three) times daily.  . Cholecalciferol (VITAMIN D3) 5000 UNITS CAPS Take 5,000 Units by mouth daily.   . clonazePAM (KLONOPIN) 1 MG tablet Take 1 mg by mouth 2 (two) times daily as needed for anxiety.   . diclofenac sodium (VOLTAREN) 1 % GEL Apply 4 g topically 4 (four) times daily. To affected area  . diltiazem (CARDIZEM) 120 MG tablet Take 120 mg by mouth 2 (two) times daily.   . famotidine (PEPCID) 20 MG tablet Take 20  mg by mouth daily.  . furosemide (LASIX) 20 MG tablet TAKE 1 TABLET BY MOUTH EVERY DAY (Patient taking differently: TAKE 1 TABLET BY MOUTH AS NEEDED FOR SWELLING)  . potassium chloride SA (K-DUR,KLOR-CON) 20 MEQ tablet TAKE 1 TABLET BY MOUTH TWICE DAILY. (Patient taking differently: TAKE 1 TABLET BY MOUTH AS NEEDED WITH LASIX)  . pravastatin (PRAVACHOL) 40 MG tablet TAKE 1 TABLET BY MOUTH EVERY NIGHT AT BEDTIME FOR CHOLESTEROL  . Probiotic Product (PROBIOTIC ACIDOPHILUS) CAPS Take 1 capsule by mouth 2 (two) times daily.   . sertraline (ZOLOFT) 100 MG tablet Take 50 mg by mouth daily.    Current Facility-Administered Medications on File Prior to Visit  Medication  . dexamethasone (DECADRON) injection 10 mg     Allergies  Allergen Reactions  . Other     All pain medications: Unknown/ CODEINE  . Prednisone Other (See Comments)    DYSPHORIA  . Vioxx [Rofecoxib]     unknown  . Entex T [Pseudoephedrine-Guaifenesin] Palpitations  . Levaquin [Levofloxacin] Rash    Tolerated Avelox without adverse effect 11/08/15.  . Sulfa Antibiotics Rash  . Trovan [Alatrofloxacin] Rash    Current Problems (verified) Patient Active Problem List   Diagnosis Date Noted  . Chronic rhinitis 10/09/2016  . Hiatal hernia 07/14/2016  . Encounter for Medicare annual wellness exam 06/30/2016  . Aortic atherosclerosis ([place]) 06/30/2016  . CKD (chronic kidney disease) stage 3, GFR 30-59 ml/min 06/30/2016  . Thyroid nodule 12/24/2015  . Other abnormal glucose 05/01/2014  . Vitamin D deficiency 05/01/2014  . Medication management 05/01/2014  . Essential hypertension 11/26/2013  . Mixed hyperlipidemia 11/26/2013  . Generalized anxiety disorder 07/11/2013  . Esophageal reflux 11/29/2012    Screening Tests Immunization History  Administered Date(s) Administered  . Influenza Split 06/04/2014, 06/05/2015  . Influenza-Unspecified 06/06/2013, 06/13/2016  . Pneumococcal Conjugate-13 06/05/2015  . Pneumococcal  Polysaccharide-23 04/06/2014  . Pneumococcal-Unspecified 09/04/1996  . Td 09/04/2005  . Tdap 03/22/2017   Preventative care: Last colonoscopy: 12/2012 Carle[facility] Purl EGD 12/2012 Last mammogram: 11/2016 and getting diagnostic next month Last pap smear/pelvic exam: 2016 normal  DEXA: dec 2014 PFT 02/2016 [place] AB 02/2015 US thyroid 01/2016 DUE 1 year repeat  Prior vaccinations: TD or Tdap: 2018  Influenza: 2017 Pneumococcal: 2015 Prevnar13: 2016 Shingles/Zostavax: declines due to cost  Names of Other Physician/Practitioners you currently use: 1. [facility] here for primary care 2. Un[facility], eye doctor, last visit Nov 2016 3. Ladell Pier, dentist, last visit 2018 q 6 months Patient Care Team: Unk Pinto, MD as PCP - General (Internal Medicine) Aquilla Hacker, MD as Referring Physician (Psychiatry) Jannette Spanner, MD as Referring Physician (Dermatology)  SURGICAL HISTORY She  has a past surgical history that includes Abdominal hysterectomy; Tubal ligation; Foot surgery (Left, 2008); Colonoscopy; Upper gastrointestinal endoscopy; Oophorectomy (Right); Carpometacarpel [facility]) suspension plasty (Right, 12/08/2013); and I&D extremity (Right, 01/20/2017). FAMILY HISTORY Her family history includes Asthma in her mother; Heart disease in her brother and father;  Stroke in her brother. SOCIAL HISTORY She  reports that she is a non-smoker but has been exposed to tobacco smoke. She has never used smokeless tobacco. She reports that she does not drink alcohol or use drugs.   Review of Systems  Constitutional: Negative.   HENT: Negative.   Eyes: Negative.   Respiratory: Negative.   Cardiovascular: Negative.   Gastrointestinal: Negative.   Genitourinary: Negative.   Musculoskeletal: Negative.   Skin: Negative.   Neurological: Negative for tingling, tremors, sensory change, speech change, focal weakness, seizures, loss of consciousness and headaches.   Endo/Heme/Allergies: Negative.   Psychiatric/Behavioral: Negative.  Negative for depression and substance abuse. The patient does not have insomnia.     Objective:     Today's Vitals   04/26/17 1106  BP: 138/78  Pulse: 82  Resp: 18  Temp: 97.7 F (36.5 C)  SpO2: 95%  Weight: 143 lb 6.4 oz (65 kg)  Height: 5\' 3"  (1.6 m)   Body mass index is 25.4 kg/m.  General appearance: alert, no distress, WD/WN, female HEENT: normocephalic, sclerae anicteric, TMs pearly, nares patent, no discharge or erythema, pharynx normal Oral cavity: MMM, no lesions Neck: supple, no lymphadenopathy, no thyromegaly, no masses Heart: RRR, normal S1, S2, no murmurs Lungs: CTA bilaterally, no wheezes, rhonchi, or rales Abdomen: +bs, soft, non tender, non distended, no masses, no hepatomegaly, no splenomegaly Musculoskeletal: nontender, no swelling, no obvious deformity Extremities: no edema, no cyanosis, no clubbing Pulses: 2+ symmetric, upper and lower extremities, normal cap refill Neurological: alert, oriented x 3, CN2-12 intact, strength normal upper extremities and lower extremities, sensation normal throughout, DTRs 2+  no cerebellar signs, gait normal Psychiatric: normal affect, behavior normal, pleasant    EKG 01/2017 Dr. Ermalene Postin WNL  [name], PA-C   04/26/2017

## 2017-04-26 ENCOUNTER — Ambulatory Visit (INDEPENDENT_AMBULATORY_CARE_PROVIDER_SITE_OTHER): Payer: PPO | Admitting: Physician Assistant

## 2017-04-26 ENCOUNTER — Encounter: Payer: Self-pay | Admitting: Physician Assistant

## 2017-04-26 ENCOUNTER — Ambulatory Visit: Payer: Self-pay | Admitting: Internal Medicine

## 2017-04-26 VITALS — BP 138/78 | HR 82 | Temp 97.7°F | Resp 18 | Ht 63.0 in | Wt 143.4 lb

## 2017-04-26 DIAGNOSIS — F411 Generalized anxiety disorder: Secondary | ICD-10-CM

## 2017-04-26 DIAGNOSIS — E782 Mixed hyperlipidemia: Secondary | ICD-10-CM | POA: Diagnosis not present

## 2017-04-26 DIAGNOSIS — N183 Chronic kidney disease, stage 3 unspecified: Secondary | ICD-10-CM

## 2017-04-26 DIAGNOSIS — Z0001 Encounter for general adult medical examination with abnormal findings: Secondary | ICD-10-CM

## 2017-04-26 DIAGNOSIS — E041 Nontoxic single thyroid nodule: Secondary | ICD-10-CM

## 2017-04-26 DIAGNOSIS — J310 Chronic rhinitis: Secondary | ICD-10-CM

## 2017-04-26 DIAGNOSIS — K449 Diaphragmatic hernia without obstruction or gangrene: Secondary | ICD-10-CM

## 2017-04-26 DIAGNOSIS — E559 Vitamin D deficiency, unspecified: Secondary | ICD-10-CM

## 2017-04-26 DIAGNOSIS — Z79899 Other long term (current) drug therapy: Secondary | ICD-10-CM | POA: Diagnosis not present

## 2017-04-26 DIAGNOSIS — R7309 Other abnormal glucose: Secondary | ICD-10-CM | POA: Diagnosis not present

## 2017-04-26 DIAGNOSIS — I10 Essential (primary) hypertension: Secondary | ICD-10-CM

## 2017-04-26 DIAGNOSIS — I700 Atherosclerosis of aorta: Secondary | ICD-10-CM

## 2017-04-26 DIAGNOSIS — K219 Gastro-esophageal reflux disease without esophagitis: Secondary | ICD-10-CM

## 2017-04-26 NOTE — Patient Instructions (Signed)
Here is some information to help you keep your heart healthy: Move it! - Aim for 30 mins of activity every day. Take it slowly at first. Talk to [place] before starting any new exercise program.   Lose it.  -Body Mass Index (BMI) can indicate if you need to lose weight. A healthy range is 18.5-24.9. For a BMI calculator, go to [employer]  Waist Management -Excess abdominal fat is a risk factor for heart disease, diabetes, asthma, stroke and more. Ideal waist circumference is less than 35" for women and less than 40" for men.   Eat Right -focus on fruits, vegetables, whole grains, and meals you make yourself. Avoid foods with trans fat and high sugar/sodium content.   Snooze or Snore? - Loud snoring can be a sign of sleep apnea, a significant risk factor for high blood pressure, heart attach, stroke, and heart arrhythmias.  Kick the habit -Quit Smoking! Avoid second hand smoke. A single cigarette raises your blood pressure for 20 mins and increases the risk of heart attack and stroke for the next 24 hours.   Are Aspirin and Supplements right for you? -Add ENTERIC COATED low dose 81 mg Aspirin daily OR can do every other day if you have easy bruising to protect your heart and head. As well as to reduce risk of Colon Cancer by 20 %, Skin Cancer by 26 % , Melanoma by 46% and Pancreatic cancer by 60%  Say "No to Stress -There may be little you can do about problems that cause stress. However, techniques such as long walks, meditation, and exercise can help you manage it.   Start Now! - Make changes one at a time and set reasonable goals to increase your likelihood of success.    Do stomach medication x 2 weeks and see if this helps your stomach at night.

## 2017-04-27 LAB — CBC WITH DIFFERENTIAL/PLATELET
Basophils Absolute: 53 cells/uL (ref 0–200)
Basophils Relative: 0.8 %
Eosinophils Absolute: 112 cells/uL (ref 15–500)
Eosinophils Relative: 1.7 %
HCT: 44.5 % (ref 35.0–45.0)
Hemoglobin: 14.9 g/dL (ref 11.7–15.5)
Lymphs Abs: 1551 cells/uL (ref 850–3900)
MCH: 29.9 pg (ref 27.0–33.0)
MCHC: 33.5 g/dL (ref 32.0–36.0)
MCV: 89.4 fL (ref 80.0–100.0)
MPV: 10.5 fL (ref 7.5–12.5)
Monocytes Relative: 9.1 %
Neutro Abs: 4283 cells/uL (ref 1500–7800)
Neutrophils Relative %: 64.9 %
Platelets: 217 10*3/uL (ref 140–400)
RBC: 4.98 10*6/uL (ref 3.80–5.10)
RDW: 13 % (ref 11.0–15.0)
Total Lymphocyte: 23.5 %
WBC mixed population: 601 cells/uL (ref 200–950)
WBC: 6.6 10*3/uL (ref 3.8–10.8)

## 2017-04-27 LAB — BASIC METABOLIC PANEL WITH GFR
BUN/Creatinine Ratio: 10 (calc) (ref 6–22)
BUN: 11 mg/dL (ref 7–25)
CO2: 27 mmol/L (ref 20–32)
Calcium: 9.8 mg/dL (ref 8.6–10.4)
Chloride: 107 mmol/L (ref 98–110)
Creat: 1.1 mg/dL — ABNORMAL HIGH (ref 0.50–0.99)
GFR, Est African American: 61 mL/min/{1.73_m2} (ref >=60)
GFR, Est Non African American: 52 mL/min/{1.73_m2} — ABNORMAL LOW (ref >=60)
Glucose, Bld: 84 mg/dL (ref 65–99)
Potassium: 4.1 mmol/L (ref 3.5–5.3)
Sodium: 142 mmol/L (ref 135–146)

## 2017-04-27 LAB — HEPATIC FUNCTION PANEL
AG Ratio: 1.8 (calc) (ref 1.0–2.5)
ALT: 18 U/L (ref 6–29)
AST: 30 U/L (ref 10–35)
Albumin: 4.5 g/dL (ref 3.6–5.1)
Alkaline phosphatase (APISO): 98 U/L (ref 33–130)
Bilirubin, Direct: 0.1 mg/dL (ref 0.0–0.2)
Globulin: 2.5 g/dL (calc) (ref 1.9–3.7)
Indirect Bilirubin: 0.3 mg/dL (calc) (ref 0.2–1.2)
Total Bilirubin: 0.4 mg/dL (ref 0.2–1.2)
Total Protein: 7 g/dL (ref 6.1–8.1)

## 2017-04-27 LAB — LIPID PANEL
Cholesterol: 178 mg/dL (ref <200)
HDL: 47 mg/dL — ABNORMAL LOW (ref >50)
LDL Cholesterol (Calc): 102 mg/dL (calc) — ABNORMAL HIGH
Non-HDL Cholesterol (Calc): 131 mg/dL (calc) — ABNORMAL HIGH (ref <130)
Total CHOL/HDL Ratio: 3.8 (calc) (ref <5.0)
Triglycerides: 174 mg/dL — ABNORMAL HIGH (ref <150)

## 2017-04-27 LAB — TSH: TSH: 2.07 mIU/L (ref 0.40–4.50)

## 2017-04-27 LAB — MAGNESIUM: Magnesium: 2.2 mg/dL (ref 1.5–2.5)

## 2017-04-30 NOTE — Progress Notes (Signed)
Pt aware of lab results & voiced understanding of those results.

## 2017-05-21 ENCOUNTER — Encounter: Payer: Self-pay | Admitting: Internal Medicine

## 2017-05-28 ENCOUNTER — Other Ambulatory Visit: Payer: Self-pay | Admitting: Physician Assistant

## 2017-05-28 ENCOUNTER — Ambulatory Visit
Admission: RE | Admit: 2017-05-28 | Discharge: 2017-05-28 | Disposition: A | Discharge status: Home / Self Care | Payer: PPO | Source: Ambulatory Visit | Attending: Physician Assistant | Admitting: Physician Assistant

## 2017-05-28 DIAGNOSIS — E041 Nontoxic single thyroid nodule: Secondary | ICD-10-CM

## 2017-05-29 NOTE — Progress Notes (Signed)
Pt aware of lab results & voiced understanding of those results.

## 2017-05-29 NOTE — Progress Notes (Signed)
Assessment and Plan:  Latoya [name] was seen today for acute visit, dizziness, fatigue, cough and excessive sweating.  Diagnoses and all orders for this visit:  Needs flu shot -     Flu vaccine HIGH DOSE PF  Dizziness -     BASIC METABOLIC PANEL WITH GFR -     Magnesium  Fatigue, unspecified type -     BASIC METABOLIC PANEL WITH GFR -     Magnesium   Likely heat-related fatigue; checking renal function and electrolytes today due to stage 3 CKD/historical electrolyte imbalances.  Instructed to hydrate more consistently throughout the day, avoid extremes of dehydration/excessive hydration, but may need to drink electrolyte supplemented drink such as gatorade if she will be working outdoors in the heat, to wear a hat, and come indoors regularly to cool off.   Further disposition pending results of labs. Discussed med's effects and SE's.   Over 30 minutes of exam, counseling, chart review, and critical decision making was performed.   Future Appointments Date Time Provider [place]  06/01/2017 8:10 AM [facility] DIAG TOMO 2 [facility] CE  06/01/2017 8:20 AM [facility] [place] 2 [facility] CE  07/30/2017 11:30 AM [name], PA-C [facility] None    ------------------------------------------------------------------------------------------------------------------   HPI BP 126/86   Pulse 85   Temp (!) 97.5 F (36.4 C)   Resp 18   Ht 5\' 3"  (1.6 m)   Wt 141 lb 12.8 oz (64.3 kg)   SpO2 97%   BMI 25.12 kg/m   66 y.o.female presents for new onset fatigue, lack of energy in the past week. She admits she overworked last week, working in her yard several days all day, and experienced dizziness while working and after coming in to the house. Hydration: reportedly drinks 4-5 16.9 oz bottles of water daily   The patient presents with hoarseness today, spitting up scant phlem of unknown color/texture. She states she is not concerned about this, and endorses that this happens  intermittently over the last few years. Patient is not a smoker. Denies seasonal allergies, itching, sneezing. Patient reports a history of acid reflux for which she takes famotidine 20 mg PRN, denies recent use- encouraged her to take this regularly for a few weeks and evaluate if this improves hoarseness/dry cough.   Was seen for a CPE 04/26/2017, labs below: Lab Results  Component Value Date   GFRNONAA 52 (L) 04/26/2017   GFRNONAA 59 (L) 01/20/2017   GFRNONAA 52 (L) 12/20/2016    Lab Results  Component Value Date   TSH 2.07 04/26/2017      Component Value Date/Time   WBC 6.6 04/26/2017 1151   RBC 4.98 04/26/2017 1151   HGB 14.9 04/26/2017 1151   HCT 44.5 04/26/2017 1151   PLT 217 04/26/2017 1151   MCV 89.4 04/26/2017 1151   MCH 29.9 04/26/2017 1151   MCHC 33.5 04/26/2017 1151   RDW 13.0 04/26/2017 1151   LYMPHSABS 1,551 04/26/2017 1151   MONOABS 1,045 (H) 12/20/2016 1206   EOSABS 112 04/26/2017 1151   BASOSABS 53 04/26/2017 1151   Last EKG: 01/20/17 WNL/unremarkable   Past Medical History:  Diagnosis Date  . Anxiety and depression   . Arthritis   . Colon polyps   . GERD (gastroesophageal reflux disease)   . Hemorrhoids   . Hiatal hernia   . History of kidney stones    passed  . Hyperlipemia   . Hypertension   . Kidney stones   . Mixed hyperlipidemia 11/26/2013  .  Panic disorder   . Renal insufficiency    stage 3 kidney disease per her PCP  . Stomach ulcer   . Unspecified essential hypertension 11/26/2013     Allergies  Allergen Reactions  . Other     All pain medications: Unknown/ CODEINE  . Prednisone Other (See Comments)    DYSPHORIA  . Vioxx [Rofecoxib]     unknown  . Entex T [Pseudoephedrine-Guaifenesin] Palpitations  . Levaquin [Levofloxacin] Rash    Tolerated Avelox without adverse effect 11/08/15.  . Sulfa Antibiotics Rash  . Trovan [Alatrofloxacin] Rash    Current Outpatient Prescriptions on File Prior to Visit  Medication Sig  . aspirin 81  MG tablet Take 81 mg by mouth at bedtime.   . Biotin 1000 MCG tablet Take 1,000 mcg by mouth 3 (three) times daily.  . Cholecalciferol (VITAMIN D3) 5000 UNITS CAPS Take 5,000 Units by mouth daily.   . clonazePAM (KLONOPIN) 1 MG tablet Take 1 mg by mouth 2 (two) times daily as needed for anxiety.   . diclofenac sodium (VOLTAREN) 1 % GEL Apply 4 g topically 4 (four) times daily. To affected area  . diltiazem (CARDIZEM) 120 MG tablet Take 120 mg by mouth 2 (two) times daily.   . famotidine (PEPCID) 20 MG tablet Take 20 mg by mouth daily.  . furosemide (LASIX) 20 MG tablet TAKE 1 TABLET BY MOUTH EVERY DAY (Patient taking differently: TAKE 1 TABLET BY MOUTH AS NEEDED FOR SWELLING)  . potassium chloride SA (K-DUR,KLOR-CON) 20 MEQ tablet TAKE 1 TABLET BY MOUTH TWICE DAILY. (Patient taking differently: TAKE 1 TABLET BY MOUTH AS NEEDED WITH LASIX)  . pravastatin (PRAVACHOL) 40 MG tablet TAKE 1 TABLET BY MOUTH EVERY NIGHT AT BEDTIME FOR CHOLESTEROL  . Probiotic Product (PROBIOTIC ACIDOPHILUS) CAPS Take 1 capsule by mouth 2 (two) times daily.   . sertraline (ZOLOFT) 100 MG tablet Take 50 mg by mouth daily.    Current Facility-Administered Medications on File Prior to Visit  Medication  . dexamethasone (DECADRON) injection 10 mg    ROS: Review of Systems  Constitutional: Positive for diaphoresis (Ongoing for several years) and malaise/fatigue. Negative for chills, fever and weight loss.  HENT: Negative for congestion, ear pain, hearing loss, sinus pain, sore throat and tinnitus.        Hoarseness, intermittent and ongoing for last several months.   Eyes: Negative for blurred vision, double vision and redness.  Respiratory: Positive for cough (Intermittent, dry). Negative for hemoptysis, sputum production, shortness of breath and wheezing.   Cardiovascular: Negative for chest pain, palpitations, claudication and leg swelling.  Gastrointestinal: Negative for abdominal pain, blood in stool, constipation,  diarrhea, heartburn, melena, nausea and vomiting.  Genitourinary: Negative.   Musculoskeletal: Negative for falls, joint pain and myalgias.  Skin: Negative.  Negative for rash.  Neurological: Positive for dizziness (intermittent, after working outdoors several days this past week). Negative for sensory change, speech change, focal weakness, weakness and headaches.  Endo/Heme/Allergies: Negative.  Negative for environmental allergies.  Psychiatric/Behavioral: Negative.  Negative for depression and suicidal ideas. The patient is not nervous/anxious and does not have insomnia.      Physical Exam:  BP 126/86   Pulse 85   Temp (!) 97.5 F (36.4 C)   Resp 18   Ht 5\' 3"  (1.6 m)   Wt 141 lb 12.8 oz (64.3 kg)   SpO2 97%   BMI 25.12 kg/m   General Appearance: Well nourished, in no apparent distress. Eyes: PERRLA, EOMs, conjunctiva no swelling  or erythema Sinuses: No Frontal/maxillary tenderness ENT/Mouth: Ext aud canals clear, TMs without erythema, bulging. No erythema, swelling, or exudate on post pharynx.  Tonsils not swollen or erythematous. Hearing normal.  Neck: Supple, thyroid normal.  Respiratory: Respiratory effort normal, BS equal bilaterally without rales, rhonchi, wheezing or stridor.  Cardio: RRR with no MRGs. Brisk peripheral pulses without edema.  Abdomen: Soft, + BS.  Non tender, no guarding, rebound, hernias, masses. Lymphatics: Non tender without lymphadenopathy.  Musculoskeletal: Full ROM, 5/5 strength, normal gait.  Skin: Warm, dry without rashes, lesions, ecchymosis.  Neuro: Cranial nerves intact. Normal muscle tone, no cerebellar symptoms. Sensation intact.  Psych: Awake and oriented X 3, normal affect, Insight and Judgment appropriate.     Izora Ribas, NP 12:24 PM [facility]

## 2017-05-30 ENCOUNTER — Encounter: Payer: Self-pay | Admitting: Adult Health

## 2017-05-30 ENCOUNTER — Ambulatory Visit (INDEPENDENT_AMBULATORY_CARE_PROVIDER_SITE_OTHER): Payer: PPO | Admitting: Adult Health

## 2017-05-30 VITALS — BP 126/86 | HR 85 | Temp 97.5°F | Resp 18 | Ht 63.0 in | Wt 141.8 lb

## 2017-05-30 DIAGNOSIS — Z23 Encounter for immunization: Secondary | ICD-10-CM | POA: Diagnosis not present

## 2017-05-30 DIAGNOSIS — R42 Dizziness and giddiness: Secondary | ICD-10-CM | POA: Diagnosis not present

## 2017-05-30 DIAGNOSIS — R5383 Other fatigue: Secondary | ICD-10-CM | POA: Diagnosis not present

## 2017-05-30 LAB — BASIC METABOLIC PANEL WITH GFR
BUN/Creatinine Ratio: 10 (calc) (ref 6–22)
BUN: 14 mg/dL (ref 7–25)
CO2: 25 mmol/L (ref 20–32)
Calcium: 9.7 mg/dL (ref 8.6–10.4)
Chloride: 107 mmol/L (ref 98–110)
Creat: 1.41 mg/dL — ABNORMAL HIGH (ref 0.50–0.99)
GFR, Est African American: 45 mL/min/{1.73_m2} — ABNORMAL LOW (ref >=60)
GFR, Est Non African American: 38 mL/min/{1.73_m2} — ABNORMAL LOW (ref >=60)
Glucose, Bld: 79 mg/dL (ref 65–99)
Potassium: 3.9 mmol/L (ref 3.5–5.3)
Sodium: 142 mmol/L (ref 135–146)

## 2017-05-30 LAB — MAGNESIUM: Magnesium: 2.3 mg/dL (ref 1.5–2.5)

## 2017-05-30 NOTE — Patient Instructions (Signed)
Heat Exhaustion Information °WHAT IS HEAT EXHAUSTION? °Heat exhaustion happens when your body gets overheated from hot weather or from exercise. Heat exhaustion can lead to heat stroke, a life-threatening condition that requires emergency care. °Heat exhaustion is more likely to develop when: °· You are exercising or being active. °· You are in hot or humid weather. °· You are in bright sunshine. °· You are not drinking enough water. ° °WHO IS AT RISK FOR THIS CONDITION? °This condition is more likely to develop in: °· People who exercise in hot or humid weather. °· People who exercise beyond their fitness level. °· People who wear clothing that does not allow sweat to evaporate. °· People who are dehydrated. °· People who drink a lot of alcoholic beverages or beverages that have caffeine. This can lead to dehydration. °· People who are age 65 or older. °· Children. °· People who have a medical condition such as heart disease, poor circulation, sickle cell disease, or high blood pressure. °· People who have a fever. °· People who are very overweight (obese). ° °WHAT ARE THE SYMPTOMS OF THIS CONDITION? °Symptoms of heat exhaustion include: °· Heavy sweating along with feeling weak, dizzy, light-headed, and nauseous. °· Rapid heartbeat. °· Headache. °· Urine that is darker than normal. °· Muscle cramps, such as in the leg or side (flank). °· Moist, cool, and clammy skin. °· Fatigue. °· Thirst. °· Confusion. °· Fainting. ° °WHAT SHOULD I DO IF I THINK I HAVE THIS CONDITION? °If you think that you have heat exhaustion, call your health care provider. Follow his or her instructions. You should also: °· Call a friend or a family member and ask him or her to stay with you. °· Move to a cooler location, such as: °? Into the shade. °? In front of a fan. °? An air-conditioned space. °· Lie down and rest. °· Slowly drink nonalcoholic, caffeine-free fluids. °· Take off tight clothing or extra clothing. °· Take a cool bath or  shower, if possible. If you do not have access to a bath or shower, dab or mist cool water on your skin. ° °WHY IS IT IMPORTANT TO TREAT THIS CONDITION? °It is important to take care of yourself and treat heat exhaustion as soon as possible. Untreated heat exhaustion can turn into heat stroke, which is a life-threatening condition that requires urgent medical treatment. °HOW CAN I PREVENT THIS CONDITION? °To prevent this condition: °· Drink enough fluid to keep your urine clear or pale yellow. This helps your body to sweat properly. °· Avoid outdoor activities on very hot or humid days. °· Do not exercise or do other physical activity when you are not feeling well. °· Take breaks often during physical activity. °· Wear light-colored, loose-fitting, and lightweight clothing when it is hot outside. °· Wear a hat and use sunscreen when exercising outdoors. °· Avoid being outside during the hottest times of the day. °· Check with your health care provider before you start any new activity, especially if you take medicine or have a medical condition. °· Start any new activity slowly and work up to your fitness level. ° °HOW CAN I HELP TO PROTECT ELDERLY RELATIVES AND NEIGHBORS FROM THIS CONDITION? °People who are age 65 or older are at greater risk for heat exhaustion. Their bodies have a harder time adjusting to heat. They are also more likely to have a medical condition or be on medicines that increase their risk for heat exhaustion. They may get heat exhaustion   indoors if the heat is high for several days. You can help to protect them during hot weather by: °· Checking on them two or more times each day. °· Making sure that they are drinking plenty of cool, nonalcoholic, and caffeine-free fluids. °· Making sure that they use their air conditioner. °· Taking them to a location where air conditioning is available. °· Talking with their health care provider about their medical needs, medicines, and fluid  requirements. ° °SEEK MEDICAL CARE IF: °· Your symptoms last longer than 30 minutes. ° °SEEK IMMEDIATE MEDICAL CARE IF: °· You have any symptoms of heat stroke. These include: °? Fever. °? Vomiting. °? Red skin. °? Inability to sweat, resulting in hot, dry skin. °? Excessive thirst. °? Rapid breathing. °? Headache. °? Confusion or disorientation. °? Fainting. °? Seizures. °These symptoms may represent a serious problem that is an emergency. Do not wait to see if the symptoms will go away. Get medical help right away. Call your local emergency services (911 in the [place] Do not drive yourself to the hospital. °This information is not intended to replace advice given to you by your health care provider. Make sure you discuss any questions you have with your health care provider. °Document Released: 05/30/2008 Document Revised: 03/10/2016 Document Reviewed: 12/12/2015 °Elsevier Interactive Patient Education © 2018 [employer] ° °

## 2017-06-01 ENCOUNTER — Other Ambulatory Visit: Payer: Self-pay | Admitting: Physician Assistant

## 2017-06-01 ENCOUNTER — Ambulatory Visit
Admission: RE | Admit: 2017-06-01 | Discharge: 2017-06-01 | Disposition: A | Discharge status: Home / Self Care | Payer: PPO | Source: Ambulatory Visit | Attending: Physician Assistant | Admitting: Physician Assistant

## 2017-06-01 DIAGNOSIS — R921 Mammographic calcification found on diagnostic imaging of breast: Secondary | ICD-10-CM

## 2017-06-01 DIAGNOSIS — N6489 Other specified disorders of breast: Secondary | ICD-10-CM

## 2017-06-01 NOTE — Progress Notes (Signed)
LVM for pt to return office call for LAB results.

## 2017-06-04 NOTE — Progress Notes (Signed)
Pt aware of lab results & voiced understanding of those results. Pt states she does not take her lasix everyday & not every week either. She reports that if she needs to take it then its maybe once or twice a week when she needs it.

## 2017-06-05 ENCOUNTER — Telehealth: Payer: Self-pay | Admitting: Adult Health

## 2017-06-05 NOTE — Telephone Encounter (Signed)
Patient called with questions regarding recent recommendations related to edema of lower extremities:   1. TED hose- if she always has swelling, she should wear these as much as possible during the day. If she has intermittent swelling or only after being on her feet, she can wear these prior to activities that usually result in edema, or once she notes edema beginning to form.   2. Hydration: we recommend 80-100 oz of fluid, as much from water as possible, or alternatively unsweet tea is also acceptable. Kidney problems can come from a lot of things, commonly high blood pressure, high sugars, chronic dehydration in the past, as well as some medications can be "hard" on the kidneys- notably NSAIDs such as aleve/ibuprofen. Currently our goal with managing your kidneys is to maintain their function- for this, regular hydration during the day (avoiding becoming very dehydrated as she did by working out in the yard for an extended period of time), and avoiding medications that are hard on your kidneys as much as possible. Use tylenol rather than NSAIDs for mild aches/pains, and use support hose to prevent edema so you don't need lasix as much.

## 2017-06-06 NOTE — Telephone Encounter (Signed)
Pt has been informed of: Marland Kitchen TED hose- if she always has swelling, she should wear these as much as possible during the day. If she has intermittent swelling or only after being on her feet, she can wear these prior to activities that usually result in edema, or once she notes edema beginning to form.   2. Hydration: we recommend 80-100 oz of fluid, as much from water as possible, or alternatively unsweet tea is also acceptable. Kidney problems can come from a lot of things, commonly high blood pressure, high sugars, chronic dehydration in the past, as well as some medications can be "hard" on the kidneys- notably NSAIDs such as aleve/ibuprofen. Currently our goal with managing your kidneys is to maintain their function- for this, regular hydration during the day (avoiding becoming very dehydrated as she did by working out in the yard for an extended period of time), and avoiding medications that are hard on your kidneys as much as possible. Use tylenol rather than NSAIDs for mild aches/pains, and use support hose to prevent edema so you don't need lasix as much.    Pt agreed & voiced understanding & hung up.

## 2017-06-18 ENCOUNTER — Encounter: Payer: Self-pay | Admitting: Adult Health

## 2017-06-18 ENCOUNTER — Ambulatory Visit (INDEPENDENT_AMBULATORY_CARE_PROVIDER_SITE_OTHER): Payer: PPO | Admitting: Adult Health

## 2017-06-18 VITALS — BP 126/84 | HR 80 | Temp 97.2°F | Resp 18 | Ht 63.0 in | Wt 142.0 lb

## 2017-06-18 DIAGNOSIS — N6001 Solitary cyst of right breast: Secondary | ICD-10-CM

## 2017-06-18 DIAGNOSIS — J029 Acute pharyngitis, unspecified: Secondary | ICD-10-CM

## 2017-06-18 DIAGNOSIS — B9789 Other viral agents as the cause of diseases classified elsewhere: Secondary | ICD-10-CM

## 2017-06-18 DIAGNOSIS — J028 Acute pharyngitis due to other specified organisms: Secondary | ICD-10-CM

## 2017-06-18 NOTE — Patient Instructions (Addendum)
Keep pushing fluids, start on an allergy medication (allegra/zyrtec/claritin) daily for the rest of the allergy season. Start flonase daily to improve ear pressure.    HOW TO TREAT VIRAL COUGH AND COLD SYMPTOMS:  -Symptoms usually last at least 1 week with the worst symptoms being around day 4.  - colds usually start with a sore throat and end with a cough, and the cough can take 2 weeks to get better.  -No antibiotics are needed for colds, flu, sore throats, cough, bronchitis UNLESS symptoms are longer than 7 days OR if you are getting better then get drastically worse.  -There are a lot of combination medications (Dayquil, Nyquil, Vicks 44, tyelnol cold and sinus, ETC). Please look at the ingredients on the back so that you are treating the correct symptoms and not doubling up on medications/ingredients.    Medicines you can use  Nasal congestion  - pseudoephedrine (Sudafed)- behind the counter, do not use if you have high blood pressure, medicine that have -D in them.  - phenylephrine (Sudafed PE) -Dextormethorphan + chlorpheniramine (Coridcidin HBP)- okay if you have high blood pressure -Oxymetazoline (Afrin) nasal spray- LIMIT to 3 days -Saline nasal spray -Neti pot (used distilled or bottled water)  Ear pain/congestion  -pseudoephedrine (sudafed) - Nasonex/flonase nasal spray  Fever  -Acetaminophen (Tyelnol) -Ibuprofen (Advil, motrin, aleve)  Sore Throat  -Acetaminophen (Tyelnol) -Ibuprofen (Advil, motrin, aleve) -Drink a lot of water -Gargle with salt water - Rest your voice (don't talk) -Throat sprays -Cough drops  Body Aches  -Acetaminophen (Tyelnol) -Ibuprofen (Advil, motrin, aleve)  Headache  -Acetaminophen (Tyelnol) -Ibuprofen (Advil, motrin, aleve) - Exedrin, Exedrin Migraine  Allergy symptoms (cough, sneeze, runny nose, itchy eyes) -Claritin or loratadine cheapest but likely the weakest  -Zyrtec or certizine at night because it can make you sleepy -The  strongest is allegra or fexafinadine  Cheapest at [employer], [employer], [employer]  Cough  -Dextromethorphan (Delsym)- medicine that has DM in it -Guafenesin (Mucinex/Robitussin) - cough drops - drink lots of water  Chest Congestion  -Guafenesin (Mucinex/Robitussin)  Red Itchy Eyes  - Naphcon-A  Upset Stomach  - Bland diet (nothing spicy, greasy, fried, and high acid foods like tomatoes, oranges, berries) -OKAY- cereal, bread, soup, crackers, rice -Eat smaller more frequent meals -reduce caffeine, no alcohol -Loperamide (Imodium-AD) if diarrhea -Prevacid for heart burn  General health when sick  -Hydration -wash your hands frequently -keep surfaces clean -change pillow cases and sheets often -Get fresh air but do not exercise strenuously -Vitamin D, double up on it - Vitamin C -Zinc

## 2017-06-18 NOTE — Progress Notes (Signed)
Assessment and Plan:  Latoya [name] was seen today for cough.  Diagnoses and all orders for this visit:  Acute viral pharyngitis  Cyst of right nipple -     Ambulatory referral to Dermatology  Unremarkable exam- symptomatic treatment discussed for viral pharyngitis, information given. Discussed antibiotics are not indicated at this time, patient in agreement with plan. Patient to call if she spikes a fever or symptoms significantly worsen. Present to ER if acutely SOB or with CP. Discussed pushing fluids and avoiding NSAIDS for CKD.   Further disposition pending results of labs. Discussed med's effects and SE's.   Over 30 minutes of exam, counseling, chart review, and critical decision making was performed.   Future Appointments Date Time Provider [place]  07/30/2017 11:30 AM Latoya Mutters, PA-C [facility] None  11/30/2017 7:50 AM [facility]  11/30/2017 8:00 AM [facility] [place] [facility]    ------------------------------------------------------------------------------------------------------------------   HPI BP 126/84   Pulse 80   Temp (!) 97.2 F (36.2 C)   Resp 18   Ht 5\' 3"  (1.6 m)   Wt 142 lb (64.4 kg)   BMI 25.15 kg/m   66 y.o.female presents for sore throat, hoarseness, congestion/drainage, ear pressure, mild cough x4 days. She denies fever/chills, CP/palpitations, SOB, wheezing, HA, vision changes, N/V/D/abdominal pain, rashes. She has not tried any interventions at home. No sick contacts. She does not currently take an allergy medication.   She mentions a cyst- like growth on her R distal  nipple that was noted by the radiologist recently while obtaining a follow up US of her breast- review of recent MMG/US of breasts unremarkable. He recommended she follow up with dermatology. Cyst like growth on distal right nipple today in office consistent with radiologists description as well as noted by provider in this office 04/26/2017.  She denies other changes or discharge of her breasts.   Past Medical History:  Diagnosis Date  . Anxiety and depression   . Arthritis   . Colon polyps   . GERD (gastroesophageal reflux disease)   . Hemorrhoids   . Hiatal hernia   . History of kidney stones    passed  . Hyperlipemia   . Hypertension   . Kidney stones   . Mixed hyperlipidemia 11/26/2013  . Panic disorder   . Renal insufficiency    stage 3 kidney disease per her PCP  . Stomach ulcer   . Unspecified essential hypertension 11/26/2013     Allergies  Allergen Reactions  . Other     All pain medications: Unknown/ CODEINE  . Prednisone Other (See Comments)    DYSPHORIA  . Vioxx [Rofecoxib]     unknown  . Entex T [Pseudoephedrine-Guaifenesin] Palpitations  . Levaquin [Levofloxacin] Rash    Tolerated Avelox without adverse effect 11/08/15.  . Sulfa Antibiotics Rash  . Trovan [Alatrofloxacin] Rash    Current Outpatient Prescriptions on File Prior to Visit  Medication Sig  . aspirin 81 MG tablet Take 81 mg by mouth at bedtime.   . Cholecalciferol (VITAMIN D3) 5000 UNITS CAPS Take 5,000 Units by mouth daily.   . clonazePAM (KLONOPIN) 1 MG tablet Take 1 mg by mouth 2 (two) times daily as needed for anxiety.   Marland Kitchen diltiazem (CARDIZEM) 120 MG tablet Take 120 mg by mouth 2 (two) times daily.   . famotidine (PEPCID) 20 MG tablet Take 20 mg by mouth daily.  . furosemide (LASIX) 20 MG tablet TAKE 1 TABLET BY MOUTH EVERY DAY (  Patient taking differently: TAKE 1 TABLET BY MOUTH AS NEEDED FOR SWELLING)  . potassium chloride SA (K-DUR,KLOR-CON) 20 MEQ tablet TAKE 1 TABLET BY MOUTH TWICE DAILY. (Patient taking differently: TAKE 1 TABLET BY MOUTH AS NEEDED WITH LASIX)  . pravastatin (PRAVACHOL) 40 MG tablet TAKE 1 TABLET BY MOUTH EVERY NIGHT AT BEDTIME FOR CHOLESTEROL  . Probiotic Product (PROBIOTIC ACIDOPHILUS) CAPS Take 1 capsule by mouth 2 (two) times daily.   . sertraline (ZOLOFT) 100 MG tablet Take 50 mg by mouth daily.     Current Facility-Administered Medications on File Prior to Visit  Medication  . dexamethasone (DECADRON) injection 10 mg    ROS: all negative except above.   Physical Exam:  BP 126/84   Pulse 80   Temp (!) 97.2 F (36.2 C)   Resp 18   Ht 5\' 3"  (1.6 m)   Wt 142 lb (64.4 kg)   BMI 25.15 kg/m   General Appearance: Well nourished, in no apparent distress. Eyes: conjunctiva no swelling or erythema Sinuses: No Frontal/maxillary tenderness ENT/Mouth: Ext aud canals clear, TMs without erythema, bulging. No effusions. No swelling, or exudate on post pharynx, though it does appear mildly injected. Tonsils not swollen or erythematous. Hearing normal.  Neck: Supple, thyroid normal.  Respiratory: Respiratory effort normal, BS equal bilaterally without rales, rhonchi, wheezing or stridor.  Cardio: RRR with no MRGs. Brisk peripheral pulses without edema.  Abdomen: Soft, + BS.  Non tender, no guarding, rebound, hernias, masses. Lymphatics: Non tender without lymphadenopathy.  Musculoskeletal: normal gait.  Skin: Warm, dry without rashes, lesions, ecchymosis.  Neuro:  Normal muscle tone, no cerebellar symptoms.  Psych: Awake and oriented X 3, normal affect, Insight and Judgment appropriate.     Latoya Ribas, NP 11:53 AM [name]

## 2017-06-20 DIAGNOSIS — L814 Other melanin hyperpigmentation: Secondary | ICD-10-CM | POA: Insufficient documentation

## 2017-06-20 DIAGNOSIS — L821 Other seborrheic keratosis: Secondary | ICD-10-CM | POA: Insufficient documentation

## 2017-06-20 DIAGNOSIS — Q8789 Other specified congenital malformation syndromes, not elsewhere classified: Secondary | ICD-10-CM

## 2017-06-20 DIAGNOSIS — Q858 Other phakomatoses, not elsewhere classified: Secondary | ICD-10-CM | POA: Insufficient documentation

## 2017-07-02 ENCOUNTER — Encounter: Payer: Self-pay | Admitting: Physician Assistant

## 2017-07-02 ENCOUNTER — Ambulatory Visit (INDEPENDENT_AMBULATORY_CARE_PROVIDER_SITE_OTHER): Payer: PPO | Admitting: Physician Assistant

## 2017-07-02 VITALS — BP 122/76 | HR 73 | Temp 97.7°F | Resp 16 | Ht 63.0 in | Wt 140.2 lb

## 2017-07-02 DIAGNOSIS — R1012 Left upper quadrant pain: Secondary | ICD-10-CM

## 2017-07-02 DIAGNOSIS — R350 Frequency of micturition: Secondary | ICD-10-CM | POA: Diagnosis not present

## 2017-07-02 DIAGNOSIS — R079 Chest pain, unspecified: Secondary | ICD-10-CM | POA: Diagnosis not present

## 2017-07-02 DIAGNOSIS — I10 Essential (primary) hypertension: Secondary | ICD-10-CM | POA: Diagnosis not present

## 2017-07-02 DIAGNOSIS — I700 Atherosclerosis of aorta: Secondary | ICD-10-CM | POA: Diagnosis not present

## 2017-07-02 NOTE — Progress Notes (Signed)
Subjective:    Patient ID: Latoya [name], female    DOB: 1951-02-01, 66 y.o.   MRN: [id number]  HPI 66 y.o. WF with history of HTN, chol, aorta atherosclerosis presents with chest pain x 2 weeks.  She has been having chest pain substernal chest pressure/tingling with indigestion, worse with turning/movement, lying down, no accompaniments with it, no radiation, she has had some sinus congestion with mild cough. She is not sleeping well, she is under a lot of stress, having some weight loss due to decreased appetite. Occ will take klonopin 1/4 at night for sleep. Just on famotidine PRN. She is on zoloft 100mg  in the AM. Having left upper AB pain, worse with moving, has nocturia, has no fever, chills, + gerd, no diarrhea, no constipation.   BMI is Body mass index is 24.84 kg/m., she is working on diet and exercise. Wt Readings from Last 10 Encounters:  07/02/17 140 lb 3.2 oz (63.6 kg)  06/18/17 142 lb (64.4 kg)  05/30/17 141 lb 12.8 oz (64.3 kg)  04/26/17 143 lb 6.4 oz (65 kg)  03/30/17 145 lb 12.8 oz (66.1 kg)  03/22/17 150 lb (68 kg)  01/20/17 148 lb (67.1 kg)  01/17/17 153 lb (69.4 kg)  01/02/17 151 lb 2 oz (68.5 kg)  12/20/16 152 lb 3.2 oz (69 kg)    Blood pressure 122/76, pulse 73, temperature 97.7 F (36.5 C), resp. rate 16, height 5\' 3"  (1.6 m), weight 140 lb 3.2 oz (63.6 kg), SpO2 98 %.   Medications Current Outpatient Prescriptions on File Prior to Visit  Medication Sig  . aspirin 81 MG tablet Take 81 mg by mouth at bedtime.   . Biotin 10000 MCG TABS Take by mouth daily.  . Cholecalciferol (VITAMIN D3) 5000 UNITS CAPS Take 5,000 Units by mouth daily.   . clonazePAM (KLONOPIN) 1 MG tablet Take 1 mg by mouth 2 (two) times daily as needed for anxiety.   Marland Kitchen diltiazem (CARDIZEM) 120 MG tablet Take 120 mg by mouth 2 (two) times daily.   . famotidine (PEPCID) 20 MG tablet Take 20 mg by mouth daily.  . furosemide (LASIX) 20 MG tablet TAKE 1 TABLET BY MOUTH EVERY DAY (Patient  taking differently: TAKE 1 TABLET BY MOUTH AS NEEDED FOR SWELLING)  . potassium chloride SA (K-DUR,KLOR-CON) 20 MEQ tablet TAKE 1 TABLET BY MOUTH TWICE DAILY. (Patient taking differently: TAKE 1 TABLET BY MOUTH AS NEEDED WITH LASIX)  . pravastatin (PRAVACHOL) 40 MG tablet TAKE 1 TABLET BY MOUTH EVERY NIGHT AT BEDTIME FOR CHOLESTEROL  . Probiotic Product (PROBIOTIC ACIDOPHILUS) CAPS Take 1 capsule by mouth 2 (two) times daily.   . sertraline (ZOLOFT) 100 MG tablet Take 50 mg by mouth daily.    Current Facility-Administered Medications on File Prior to Visit  Medication  . dexamethasone (DECADRON) injection 10 mg    Problem list She has Esophageal reflux; Generalized anxiety disorder; Essential hypertension; Mixed hyperlipidemia; Other abnormal glucose; Vitamin D deficiency; Medication management; Thyroid nodule; Encounter for Medicare annual wellness exam; Aortic atherosclerosis ([place]); CKD (chronic kidney disease) stage 3, GFR 30-59 ml/min ([place]); Hiatal hernia; and Chronic rhinitis on her problem list.   Review of Systems  Constitutional: Negative for chills, diaphoresis, fatigue and fever.  HENT: Negative for ear pain.   Respiratory: Positive for cough. Negative for apnea, choking, chest tightness, shortness of breath, wheezing and stridor.   Cardiovascular: Positive for chest pain. Negative for palpitations and leg swelling.  Gastrointestinal: Positive for abdominal distention and abdominal  pain. Negative for anal bleeding, blood in stool, constipation, diarrhea, nausea, rectal pain and vomiting.  Genitourinary: Positive for dysuria, frequency and urgency. Negative for decreased urine volume, difficulty urinating, dyspareunia, enuresis, flank pain, genital sores, hematuria, menstrual problem, pelvic pain, vaginal bleeding and vaginal discharge.  Neurological: Negative.   Hematological: Negative.   Psychiatric/Behavioral: Positive for dysphoric mood. Negative for agitation, behavioral  problems, confusion, decreased concentration, hallucinations, self-injury, sleep disturbance and suicidal ideas. The patient is nervous/anxious. The patient is not hyperactive.        Objective:   Physical Exam  Constitutional: She is oriented to person, place, and time. She appears well-developed and well-nourished. No distress.  HENT:  Head: Normocephalic.  Right Ear: Hearing, tympanic membrane and external ear normal.  Left Ear: No tenderness. No mastoid tenderness. Decreased hearing is noted.  Mouth/Throat: Oropharynx is clear and moist. No oropharyngeal exudate.  Eyes: Conjunctivae are normal. No scleral icterus.  Neck: Normal range of motion. Neck supple. No JVD present. No thyromegaly present.  Cardiovascular: Normal rate, regular rhythm, normal heart sounds and intact distal pulses.  Exam reveals no gallop and no friction rub.   No murmur heard. Pulmonary/Chest: Effort normal and breath sounds normal. No respiratory distress. She has no wheezes. She has no rales. She exhibits no tenderness.  Abdominal: Soft. Bowel sounds are normal. She exhibits no distension and no mass. There is no tenderness. There is no rebound and no guarding.  Musculoskeletal: Normal range of motion.  Patient rises slowly from sitting to standing.  They walk without an antalgic gait.  There is no evidence of erythema, ecchymosis, or gross deformity.  There is tenderness to palpation over left lumbar paraspinal muscles.  Active ROM is limited due to pain especially with lateral bending and rotation.  Sensation to light touch is intact over all extremities.  Strength is symmetric and equal in all extremities.    Lymphadenopathy:    She has no cervical adenopathy.  Neurological: She is alert and oriented to person, place, and time.  Skin: Skin is warm and dry. No rash noted. She is not diaphoretic. No erythema. No pallor.  Psychiatric: She has a normal mood and affect. Her behavior is normal. Judgment and thought  content normal.  Nursing note and vitals reviewed.      Assessment & Plan:    Essential hypertension -     CBC with Differential/Platelet -     BASIC METABOLIC PANEL WITH GFR -     Hepatic function panel  Aortic atherosclerosis (HCC) Control blood pressure, cholesterol, glucose, increase exercise.   Chest pain, unspecified type/Left upper quadrant abdominal pain of unknown etiology Atypical, known large hiatal hernia, + GERD, likely reflux, give dexilant samples then get on zantac 150 BID If not better will refer to cardio -     EKG 12-Lead- normal Go to the ER if any chest pain, shortness of breath, nausea, dizziness, severe HA, changes vision/speech  Urinary frequency -     Urinalysis, Routine w reflex microscopic -     Urine Culture    Future Appointments Date Time Provider [place]  07/30/2017 11:30 AM [name], PA-C [facility] None  11/30/2017 7:50 AM [facility]  11/30/2017 8:00 AM [facility] [place] [facility]

## 2017-07-02 NOTE — Patient Instructions (Addendum)
Get on dexilant 1 pill a day for 10 days Then get on famotidine once a day for 2 weeks  Look up silent reflux and LPR  On google  If not better 2-4 weeks will get CT AB and refer to cardio Go to the ER if any chest pain, shortness of breath, nausea, dizziness, severe HA, changes vision/speech Nonspecific Chest Pain Chest pain can be caused by many different conditions. There is always a chance that your pain could be related to something serious, such as a heart attack or a blood clot in your lungs. Chest pain can also be caused by conditions that are not life-threatening. If you have chest pain, it is very important to follow up with your health care provider. What are the causes? Causes of this condition include:  Heartburn.  Pneumonia or bronchitis.  Anxiety or stress.  Inflammation around your heart (pericarditis) or lung (pleuritis or pleurisy).  A blood clot in your lung.  A collapsed lung (pneumothorax). This can develop suddenly on its own (spontaneous pneumothorax) or from trauma to the chest.  Shingles infection (varicella-zoster virus).  Heart attack.  Damage to the bones, muscles, and cartilage that make up your chest wall. This can include: ? Bruised bones due to injury. ? Strained muscles or cartilage due to frequent or repeated coughing or overwork. ? Fracture to one or more ribs. ? Sore cartilage due to inflammation (costochondritis).  What increases the risk? Risk factors for this condition may include:  Activities that increase your risk for trauma or injury to your chest.  Respiratory infections or conditions that cause frequent coughing.  Medical conditions or overeating that can cause heartburn.  Heart disease or family history of heart disease.  Conditions or health behaviors that increase your risk of developing a blood clot.  Having had chicken pox (varicella zoster).  What are the signs or symptoms? Chest pain can feel like:  Burning or  tingling on the surface of your chest or deep in your chest.  Crushing, pressure, aching, or squeezing pain.  Dull or sharp pain that is worse when you move, cough, or take a deep breath.  Pain that is also felt in your back, neck, shoulder, or arm, or pain that spreads to any of these areas.  Your chest pain may come and go, or it may stay constant. How is this diagnosed? Lab tests or other studies may be needed to find the cause of your pain. Your health care provider may have you take a test called an ECG (electrocardiogram). An ECG records your heartbeat patterns at the time the test is performed. You may also have other tests, such as:  Transthoracic echocardiogram (TTE). In this test, sound waves are used to create a picture of the heart structures and to look at how blood flows through your heart.  Transesophageal echocardiogram (TEE).This is a more advanced imaging test that takes images from inside your body. It allows your health care provider to see your heart in finer detail.  Cardiac monitoring. This allows your health care provider to monitor your heart rate and rhythm in real time.  Holter monitor. This is a portable device that records your heartbeat and can help to diagnose abnormal heartbeats. It allows your health care provider to track your heart activity for several days, if needed.  Stress tests. These can be done through exercise or by taking medicine that makes your heart beat more quickly.  Blood tests.  Other imaging tests.  How is  this treated? Treatment depends on what is causing your chest pain. Treatment may include:  Medicines. These may include: ? Acid blockers for heartburn. ? Anti-inflammatory medicine. ? Pain medicine for inflammatory conditions. ? Antibiotic medicine, if an infection is present. ? Medicines to dissolve blood clots. ? Medicines to treat coronary artery disease (CAD).  Supportive care for conditions that do not require  medicines. This may include: ? Resting. ? Applying heat or cold packs to injured areas. ? Limiting activities until pain decreases.  Follow these instructions at home: Medicines  If you were prescribed an antibiotic, take it as told by your health care provider. Do not stop taking the antibiotic even if you start to feel better.  Take over-the-counter and prescription medicines only as told by your health care provider. Lifestyle  Do not use any products that contain nicotine or tobacco, such as cigarettes and e-cigarettes. If you need help quitting, ask your health care provider.  Do not drink alcohol.  Make lifestyle changes as directed by your health care provider. These may include: ? Getting regular exercise. Ask your health care provider to suggest some activities that are safe for you. ? Eating a heart-healthy diet. A registered dietitian can help you to learn healthy eating options. ? Maintaining a healthy weight. ? Managing diabetes, if necessary. ? Reducing stress, such as with yoga or relaxation techniques. General instructions  Avoid any activities that bring on chest pain.  If heartburn is the cause for your chest pain, raise (elevate) the head of your bed about 6 inches (15 cm) by putting blocks under the legs. Sleeping with more pillows does not effectively relieve heartburn because it only changes the position of your head.  Keep all follow-up visits as told by your health care provider. This is important. This includes any further testing if your chest pain does not go away. Contact a health care provider if:  Your chest pain does not go away.  You have a rash with blisters on your chest.  You have a fever.  You have chills. Get help right away if:  Your chest pain is worse.  You have a cough that gets worse, or you cough up blood.  You have severe pain in your abdomen.  You have severe weakness.  You faint.  You have sudden, unexplained chest  discomfort.  You have sudden, unexplained discomfort in your arms, back, neck, or jaw.  You have shortness of breath at any time.  You suddenly start to sweat, or your skin gets clammy.  You feel nauseous or you vomit.  You suddenly feel light-headed or dizzy.  Your heart begins to beat quickly, or it feels like it is skipping beats. These symptoms may represent a serious problem that is an emergency. Do not wait to see if the symptoms will go away. Get medical help right away. Call your local emergency services (911 in the [place] Do not drive yourself to the hospital. This information is not intended to replace advice given to you by your health care provider. Make sure you discuss any questions you have with your health care provider. Document Released: 05/31/2005 Document Revised: 05/15/2016 Document Reviewed: 05/15/2016 Elsevier Interactive Patient Education  2017 [employer].

## 2017-07-03 ENCOUNTER — Other Ambulatory Visit: Payer: Self-pay | Admitting: Internal Medicine

## 2017-07-03 LAB — BASIC METABOLIC PANEL WITH GFR
BUN/Creatinine Ratio: 11 (calc) (ref 6–22)
BUN: 13 mg/dL (ref 7–25)
CO2: 28 mmol/L (ref 20–32)
Calcium: 10.3 mg/dL (ref 8.6–10.4)
Chloride: 103 mmol/L (ref 98–110)
Creat: 1.14 mg/dL — ABNORMAL HIGH (ref 0.50–0.99)
GFR, Est African American: 58 mL/min/{1.73_m2} — ABNORMAL LOW (ref >=60)
GFR, Est Non African American: 50 mL/min/{1.73_m2} — ABNORMAL LOW (ref >=60)
Glucose, Bld: 71 mg/dL (ref 65–99)
Potassium: 4.2 mmol/L (ref 3.5–5.3)
Sodium: 142 mmol/L (ref 135–146)

## 2017-07-03 LAB — CBC WITH DIFFERENTIAL/PLATELET
Basophils Absolute: 50 cells/uL (ref 0–200)
Basophils Relative: 0.7 %
Eosinophils Absolute: 130 cells/uL (ref 15–500)
Eosinophils Relative: 1.8 %
HCT: 42.4 % (ref 35.0–45.0)
Hemoglobin: 14.2 g/dL (ref 11.7–15.5)
Lymphs Abs: 1800 cells/uL (ref 850–3900)
MCH: 29.6 pg (ref 27.0–33.0)
MCHC: 33.5 g/dL (ref 32.0–36.0)
MCV: 88.3 fL (ref 80.0–100.0)
MPV: 10.4 fL (ref 7.5–12.5)
Monocytes Relative: 11 %
Neutro Abs: 4428 cells/uL (ref 1500–7800)
Neutrophils Relative %: 61.5 %
Platelets: 215 10*3/uL (ref 140–400)
RBC: 4.8 10*6/uL (ref 3.80–5.10)
RDW: 12.8 % (ref 11.0–15.0)
Total Lymphocyte: 25 %
WBC mixed population: 792 cells/uL (ref 200–950)
WBC: 7.2 10*3/uL (ref 3.8–10.8)

## 2017-07-03 LAB — HEPATIC FUNCTION PANEL
AG Ratio: 1.7 (calc) (ref 1.0–2.5)
ALT: 19 U/L (ref 6–29)
AST: 32 U/L (ref 10–35)
Albumin: 4.3 g/dL (ref 3.6–5.1)
Alkaline phosphatase (APISO): 97 U/L (ref 33–130)
Bilirubin, Direct: 0.1 mg/dL (ref 0.0–0.2)
Globulin: 2.6 g/dL (calc) (ref 1.9–3.7)
Indirect Bilirubin: 0.2 mg/dL (calc) (ref 0.2–1.2)
Total Bilirubin: 0.3 mg/dL (ref 0.2–1.2)
Total Protein: 6.9 g/dL (ref 6.1–8.1)

## 2017-07-03 LAB — URINALYSIS, ROUTINE W REFLEX MICROSCOPIC
Bacteria, UA: NONE SEEN /HPF
Bilirubin Urine: NEGATIVE
Glucose, UA: NEGATIVE
Hgb urine dipstick: NEGATIVE
Nitrite: NEGATIVE
RBC / HPF: NONE SEEN /HPF (ref 0–2)
Specific Gravity, Urine: 1.022 (ref 1.001–1.03)
pH: 6.5 (ref 5.0–8.0)

## 2017-07-03 LAB — URINE CULTURE
MICRO NUMBER:: 81210433
SPECIMEN QUALITY:: ADEQUATE

## 2017-07-04 ENCOUNTER — Telehealth: Payer: Self-pay

## 2017-07-04 NOTE — Telephone Encounter (Signed)
Pt reports she took dexilant made her jittery/nervous. Today has been in pain. Please advise.  Per provider pt should stop & try OTC nexium & follow up with GI doctor.  Pt voiced understanding & hung up.

## 2017-07-10 ENCOUNTER — Other Ambulatory Visit: Payer: Self-pay | Admitting: Physician Assistant

## 2017-07-10 DIAGNOSIS — K2100 Gastro-esophageal reflux disease with esophagitis, without bleeding: Secondary | ICD-10-CM

## 2017-07-10 DIAGNOSIS — R1012 Left upper quadrant pain: Secondary | ICD-10-CM

## 2017-07-29 NOTE — Progress Notes (Signed)
MEDICARE WELLNESS  Assessment and Plan:    Essential hypertension - continue medications, DASH diet, exercise and monitor at home. Call if greater than 130/80.  -     CBC with Differential/Platelet -     Hepatic function panel -     BASIC METABOLIC PANEL WITH GFR -     TSH  Aortic atherosclerosis (HCC) Control blood pressure, cholesterol, glucose, increase exercise.   Thyroid nodule Hypothyroidism-check TSH level, continue medications the same, reminded to take on an empty stomach 30-19mins before food.  -     TSH -     US SOFT TISSUE HEAD AND NECK; Future  CKD (chronic kidney disease) stage 3, GFR 30-59 ml/min Increase fluids, avoid NSAIDS, monitor sugars, will monitor  Mixed hyperlipidemia -continue medications, check lipids, decrease fatty foods, increase activity.  -     Lipid panel  Other abnormal glucose Discussed general issues about diabetes pathophysiology and management., Educational material distributed., Suggested low cholesterol diet., Encouraged aerobic exercise., Discussed foot care., Reminded to get yearly retinal exam.  Medication management -     Magnesium  Hiatal hernia Get on pepcid x 2 weeks  Chronic rhinitis - Allegra OTC, increase H20, allergy hygiene explained.  Gastroesophageal reflux disease, esophagitis presence not specified Continue PPI/H2 blocker, diet discussed  Generalized anxiety disorder continue medications, stress management techniques discussed, increase water, good sleep hygiene discussed, increase exercise, and increase veggies.   Vitamin D deficiency  Encounter for Medicare annual wellness exam 1 year  UTD  BMI 25.0-25.9,adult  Left flank pain -     US Abdomen Complete; Future - follow up Dr. Carlean Purl, no pain with palpation to back, will get imaging and follow up with Dr. Carlean Purl   Over 40 minutes of exam, counseling, chart review and critical decision making was performed Future Appointments  Date Time Provider  [place]  07/31/2017  8:30 AM Zehr, Laban Emperor, PA-C [facility]  11/30/2017  7:50 AM [facility] [place] 3 [facility]  11/30/2017  8:00 AM [facility] [place] 3 [facility]    Plan:   During the course of the visit the patient was educated and counseled about appropriate screening and preventive services including:    Pneumococcal vaccine   Prevnar 13  Influenza vaccine  Td vaccine  Screening electrocardiogram  Bone densitometry screening  Colorectal cancer screening  Diabetes screening  Glaucoma screening  Nutrition counseling   Advanced directives: requested    Subjective:  Latoya [name] is a 66 y.o. female who presents for CPE and follow up for HTN, chol, depression.   She is having left back pain, flank pain, and epigastric pain, has appointment with Dr. Carlean Purl tomorrow morning AM. [name], worse with moving, better with lying still. No nausea, no diarrhea, no constipation. No urinary issues.   Her blood pressure has been controlled at home, today their BP is BP: 118/76  She does workout, walking, yard work, house work. She denies chest pain, shortness of breath, dizziness.  She is on cholesterol medication and denies myalgias. Her cholesterol is not at goal. The cholesterol last visit was:   Lab Results  Component Value Date   CHOL 178 04/26/2017   HDL 47 (L) 04/26/2017   LDLCALC 88 12/20/2016   TRIG 174 (H) 04/26/2017   CHOLHDL 3.8 04/26/2017   She is on zoloft and on klonopin rarely for anxiety/depression.   She has been working on diet and exercise for prediabetes, and denies paresthesia of the feet, polydipsia,  polyuria and visual disturbances. Last A1C in the office was:  Lab Results  Component Value Date   HGBA1C 5.6 06/30/2016   Last GFR: Lab Results  Component Value Date   GFRNONAA 50 (L) 07/02/2017   Patient is on Vitamin D supplement.   Lab Results  Component Value Date   VD25OH 61 06/30/2016     BMI is Body mass  index is 25.44 kg/m., she is working on diet and exercise. Wt Readings from Last 3 Encounters:  07/30/17 143 lb 9.6 oz (65.1 kg)  07/02/17 140 lb 3.2 oz (63.6 kg)  06/18/17 142 lb (64.4 kg)     Medication Review: Current Outpatient Medications on File Prior to Visit  Medication Sig  . aspirin 81 MG tablet Take 81 mg by mouth at bedtime.   . Biotin 10000 MCG TABS Take by mouth daily.  . Cholecalciferol (VITAMIN D3) 5000 UNITS CAPS Take 5,000 Units by mouth daily.   . clonazePAM (KLONOPIN) 1 MG tablet Take 1 mg by mouth 2 (two) times daily as needed for anxiety.   Marland Kitchen diltiazem (CARDIZEM) 120 MG tablet Take 120 mg by mouth 2 (two) times daily.   . famotidine (PEPCID) 20 MG tablet Take 20 mg by mouth daily.  . furosemide (LASIX) 20 MG tablet TAKE 1 TABLET BY MOUTH EVERY DAY  . potassium chloride SA (K-DUR,KLOR-CON) 20 MEQ tablet TAKE 1 TABLET BY MOUTH TWICE DAILY  . pravastatin (PRAVACHOL) 40 MG tablet TAKE 1 TABLET BY MOUTH EVERY NIGHT AT BEDTIME FOR CHOLESTEROL  . Probiotic Product (PROBIOTIC ACIDOPHILUS) CAPS Take 1 capsule by mouth 2 (two) times daily.   . sertraline (ZOLOFT) 100 MG tablet Take 50 mg by mouth daily.    Current Facility-Administered Medications on File Prior to Visit  Medication  . dexamethasone (DECADRON) injection 10 mg     Allergies  Allergen Reactions  . Other     All pain medications: Unknown/ CODEINE  . Prednisone Other (See Comments)    DYSPHORIA  . Vioxx [Rofecoxib]     unknown  . Entex T [Pseudoephedrine-Guaifenesin] Palpitations  . Levaquin [Levofloxacin] Rash    Tolerated Avelox without adverse effect 11/08/15.  . Sulfa Antibiotics Rash  . Trovan [Alatrofloxacin] Rash    Current Problems (verified) Patient Active Problem List   Diagnosis Date Noted  . Chronic rhinitis 10/09/2016  . Hiatal hernia 07/14/2016  . Encounter for Medicare annual wellness exam 06/30/2016  . Aortic atherosclerosis ([place]) 06/30/2016  . CKD (chronic kidney disease)  stage 3, GFR 30-59 ml/min (HCC) 06/30/2016  . Thyroid nodule 12/24/2015  . Other abnormal glucose 05/01/2014  . Vitamin D deficiency 05/01/2014  . Medication management 05/01/2014  . Essential hypertension 11/26/2013  . Mixed hyperlipidemia 11/26/2013  . Generalized anxiety disorder 07/11/2013  . Esophageal reflux 11/29/2012    Screening Tests Immunization History  Administered Date(s) Administered  . Influenza Split 06/04/2014, 06/05/2015  . Influenza, High Dose Seasonal PF 05/30/2017  . Influenza-Unspecified 06/06/2013, 06/13/2016  . Pneumococcal Conjugate-13 06/05/2015  . Pneumococcal Polysaccharide-23 04/06/2014  . Pneumococcal-Unspecified 09/04/1996  . Td 09/04/2005  . Tdap 03/22/2017   Preventative care: Last colonoscopy: 12/2012 Carlean Purl EGD 12/2012 Last mammogram: 11/2016 and getting diagnostic 05/2017 Last pap smear/pelvic exam: 2016 normal  DEXA: dec 2014 PFT 02/2016 [place] AB 02/2015 US thyroid 05/2017 need repeat 1 year CT chest 12/24/2015  Prior vaccinations: TD or Tdap: 2018  Influenza: 2017 Pneumococcal: 2015 Prevnar13: 2016 Shingles/Zostavax: declines due to cost  Names of Other Physician/Practitioners you currently use: 1.  [facility] here for primary care 2. [facility], eye doctor, last visit Nov 2017 3. Ladell Pier, dentist, last visit 2018 q 6 months Patient Care Team: Unk Pinto, MD as PCP - General (Internal Medicine) Aquilla Hacker, MD as Referring Physician (Psychiatry) Jannette Spanner, MD as Referring Physician (Dermatology)  SURGICAL HISTORY She  has a past surgical history that includes Abdominal hysterectomy; Tubal ligation; Foot surgery (Left, 2008); Colonoscopy; Upper gastrointestinal endoscopy; Oophorectomy (Right); Carpometacarpel [facility]) suspension plasty (Right, 12/08/2013); and I&D extremity (Right, 01/20/2017). FAMILY HISTORY Her family history includes Asthma in her mother; Heart disease in her  brother and father; Stroke in her brother. SOCIAL HISTORY She  reports that she is a non-smoker but has been exposed to tobacco smoke. she has never used smokeless tobacco. She reports that she does not drink alcohol or use drugs.  MEDICARE WELLNESS OBJECTIVES: Physical activity: Current Exercise Habits: The patient does not participate in regular exercise at present Cardiac risk factors: Cardiac Risk Factors include: advanced age (>1men, >14 women);dyslipidemia;hypertension;sedentary lifestyle Depression/mood screen:   Depression screen [facility] 2/9 07/30/2017  Decreased Interest 0  Down, Depressed, Hopeless 0  PHQ - 2 Score 0    ADLs:  In your present state of health, do you have any difficulty performing the following activities: 07/30/2017 01/20/2017  Hearing? N N  Vision? N N  Difficulty concentrating or making decisions? Y N  Walking or climbing stairs? N N  Dressing or bathing? N N  Doing errands, shopping? N -  Some recent data might be hidden     Cognitive Testing  Alert? Yes  Normal Appearance?Yes  Oriented to person? Yes  Place? Yes   Time? Yes  Recall of three objects?  Yes  Can perform simple calculations? Yes  Displays appropriate judgment?Yes  Can read the correct time from a watch face?Yes  EOL planning: Does Patient Have a Medical Advance Directive?: Yes Type of Advance Directive: Healthcare Power of Attorney, Living will Copy of [place] in Chart?: No - copy requested   Review of Systems  Constitutional: Negative.   HENT: Negative.   Eyes: Negative.   Respiratory: Negative.   Cardiovascular: Negative.   Gastrointestinal: Negative.   Genitourinary: Negative.   Musculoskeletal: Negative.   Skin: Negative.   Neurological: Negative for tingling, tremors, sensory change, speech change, focal weakness, seizures, loss of consciousness and headaches.  Endo/Heme/Allergies: Negative.   Psychiatric/Behavioral: Negative.  Negative for depression  and substance abuse. The patient does not have insomnia.     Objective:     Today's Vitals   07/30/17 1128  BP: 118/76  Pulse: 81  Resp: 16  Temp: (!) 97.5 F (36.4 C)  SpO2: 97%  Weight: 143 lb 9.6 oz (65.1 kg)  Height: 5\' 3"  (1.6 m)  PainSc: 10-Worst pain ever   Body mass index is 25.44 kg/m.  General appearance: alert, no distress, WD/WN, female HEENT: normocephalic, sclerae anicteric, TMs pearly, nares patent, no discharge or erythema, pharynx normal Oral cavity: MMM, no lesions Neck: supple, no lymphadenopathy, no thyromegaly, no masses Heart: RRR, normal S1, S2, no murmurs Lungs: CTA bilaterally, no wheezes, rhonchi, or rales Abdomen: +bs, soft, Left upper quadrant tenderness, non distended, no masses, no hepatomegaly, no splenomegaly Musculoskeletal: nontender, no swelling, no obvious deformity Extremities: no edema, no cyanosis, no clubbing Pulses: 2+ symmetric, upper and lower extremities, normal cap refill Neurological: alert, oriented x 3, CN2-12 intact, strength normal upper extremities and lower extremities, sensation normal throughout, DTRs 2+  no cerebellar signs, gait normal Psychiatric: normal affect, behavior normal, pleasant    Medicare Attestation I have personally reviewed: The patient's medical and social history Their use of alcohol, tobacco or illicit drugs Their current medications and supplements The patient's functional ability including ADLs,fall risks, home safety risks, cognitive, and hearing and visual impairment Diet and physical activities Evidence for depression or mood disorders  The patient's weight, height, BMI, and visual acuity have been recorded in the chart.  I have made referrals, counseling, and provided education to the patient based on review of the above and I have provided the patient with a written personalized care plan for preventive services.     [name], PA-C   07/30/2017

## 2017-07-30 ENCOUNTER — Encounter: Payer: Self-pay | Admitting: Physician Assistant

## 2017-07-30 ENCOUNTER — Ambulatory Visit: Payer: PPO | Admitting: Physician Assistant

## 2017-07-30 VITALS — BP 118/76 | HR 81 | Temp 97.5°F | Resp 16 | Ht 63.0 in | Wt 143.6 lb

## 2017-07-30 DIAGNOSIS — K219 Gastro-esophageal reflux disease without esophagitis: Secondary | ICD-10-CM | POA: Diagnosis not present

## 2017-07-30 DIAGNOSIS — E041 Nontoxic single thyroid nodule: Secondary | ICD-10-CM | POA: Diagnosis not present

## 2017-07-30 DIAGNOSIS — R7309 Other abnormal glucose: Secondary | ICD-10-CM

## 2017-07-30 DIAGNOSIS — Z79899 Other long term (current) drug therapy: Secondary | ICD-10-CM

## 2017-07-30 DIAGNOSIS — R109 Unspecified abdominal pain: Secondary | ICD-10-CM

## 2017-07-30 DIAGNOSIS — K449 Diaphragmatic hernia without obstruction or gangrene: Secondary | ICD-10-CM

## 2017-07-30 DIAGNOSIS — F411 Generalized anxiety disorder: Secondary | ICD-10-CM

## 2017-07-30 DIAGNOSIS — I10 Essential (primary) hypertension: Secondary | ICD-10-CM | POA: Diagnosis not present

## 2017-07-30 DIAGNOSIS — R6889 Other general symptoms and signs: Secondary | ICD-10-CM | POA: Diagnosis not present

## 2017-07-30 DIAGNOSIS — Z0001 Encounter for general adult medical examination with abnormal findings: Secondary | ICD-10-CM | POA: Diagnosis not present

## 2017-07-30 DIAGNOSIS — N183 Chronic kidney disease, stage 3 unspecified: Secondary | ICD-10-CM

## 2017-07-30 DIAGNOSIS — Z6825 Body mass index (BMI) 25.0-25.9, adult: Secondary | ICD-10-CM

## 2017-07-30 DIAGNOSIS — E782 Mixed hyperlipidemia: Secondary | ICD-10-CM | POA: Diagnosis not present

## 2017-07-30 DIAGNOSIS — E559 Vitamin D deficiency, unspecified: Secondary | ICD-10-CM | POA: Diagnosis not present

## 2017-07-30 DIAGNOSIS — I700 Atherosclerosis of aorta: Secondary | ICD-10-CM | POA: Diagnosis not present

## 2017-07-30 DIAGNOSIS — Z0000 Encounter for general adult medical examination without abnormal findings: Secondary | ICD-10-CM

## 2017-07-30 NOTE — Patient Instructions (Signed)
Flank Pain, Adult Flank pain is pain that is located on the side of the body between the upper abdomen and the back. This area is called the flank. The pain may occur over a short period of time (acute), or it may be long-term or recurring (chronic). It may be mild or severe. Flank pain can be caused by many things, including:  Muscle soreness or injury.  Kidney stones or kidney disease.  Stress.  A disease of the spine (vertebral disk disease).  A lung infection (pneumonia).  Fluid around the lungs (pulmonary edema).  A skin rash caused by the chickenpox virus (shingles).  Tumors that affect the back of the abdomen.  Gallbladder disease.  Follow these instructions at home:  Drink enough fluid to keep your urine clear or pale yellow.  Rest as told by your health care provider.  Take over-the-counter and prescription medicines only as told by your health care provider.  Keep a journal to track what has caused your flank pain and what has made it feel better.  Keep all follow-up visits as told by your health care provider. This is important. Contact a health care provider if:  Your pain is not controlled with medicine.  You have new symptoms.  Your pain gets worse.  You have a fever.  Your symptoms last longer than 2-3 days.  You have trouble urinating or you are urinating very frequently. Get help right away if:  You have trouble breathing or you are short of breath.  Your abdomen hurts or it is swollen or red.  You have nausea or vomiting.  You feel faint or you pass out.  You have blood in your urine. Summary  Flank pain is pain that is located on the side of the body between the upper abdomen and the back.  The pain may occur over a short period of time (acute), or it may be long-term or recurring (chronic). It may be mild or severe.  Flank pain can be caused by many things.  Contact your health care provider if your symptoms get worse or they last  longer than 2-3 days. This information is not intended to replace advice given to you by your health care provider. Make sure you discuss any questions you have with your health care provider. Document Released: 10/12/2005 Document Revised: 11/03/2016 Document Reviewed: 11/03/2016 Elsevier Interactive Patient Education  2018 [employer].   Hiatal Hernia A hiatal hernia occurs when part of the stomach slides above the muscle that separates the abdomen from the chest (diaphragm). A person can be born with a hiatal hernia (congenital), or it may develop over time. In almost all cases of hiatal hernia, only the top part of the stomach pushes through the diaphragm. Many people have a hiatal hernia with no symptoms. The larger the hernia, the more likely it is that you will have symptoms. In some cases, a hiatal hernia allows stomach acid to flow back into the tube that carries food from your mouth to your stomach (esophagus). This may cause heartburn symptoms. Severe heartburn symptoms may mean that you have developed a condition called gastroesophageal reflux disease (GERD). What are the causes? This condition is caused by a weakness in the opening (hiatus) where the esophagus passes through the diaphragm to attach to the upper part of the stomach. A person may be born with a weakness in the hiatus, or a weakness can develop over time. What increases the risk? This condition is more likely to develop in:  Older people. Age is a major risk factor for a hiatal hernia, especially if you are over the age of 83.  Pregnant women.  People who are overweight.  People who have frequent constipation.  What are the signs or symptoms? Symptoms of this condition usually develop in the form of GERD symptoms. Symptoms include:  Heartburn.  Belching.  Indigestion.  Trouble swallowing.  Coughing or wheezing.  Sore throat.  Hoarseness.  Chest pain.  Nausea and vomiting.  How is this  diagnosed? This condition may be diagnosed during testing for GERD. Tests that may be done include:  X-rays of your stomach or chest.  An upper gastrointestinal (GI) series. This is an X-ray exam of your GI tract that is taken after you swallow a chalky liquid that shows up clearly on the X-ray.  Endoscopy. This is a procedure to look into your stomach using a thin, flexible tube that has a tiny camera and light on the end of it.  How is this treated? This condition may be treated by:  Dietary and lifestyle changes to help reduce GERD symptoms.  Medicines. These may include: ? Over-the-counter antacids. ? Medicines that make your stomach empty more quickly. ? Medicines that block the production of stomach acid (H2 blockers). ? Stronger medicines to reduce stomach acid (proton pump inhibitors).  Surgery to repair the hernia, if other treatments are not helping.  If you have no symptoms, you may not need treatment. Follow these instructions at home: Lifestyle and activity  Do not use any products that contain nicotine or tobacco, such as cigarettes and e-cigarettes. If you need help quitting, ask your health care provider.  Try to achieve and maintain a healthy body weight.  Avoid putting pressure on your abdomen. Anything that puts pressure on your abdomen increases the amount of acid that may be pushed up into your esophagus. ? Avoid bending over, especially after eating. ? Raise the head of your bed by putting blocks under the legs. This keeps your head and esophagus higher than your stomach. ? Do not wear tight clothing around your chest or stomach. ? Try not to strain when having a bowel movement, when urinating, or when lifting heavy objects. Eating and drinking  Avoid foods that can worsen GERD symptoms. These may include: ? Fatty foods, like fried foods. ? Citrus fruits, like oranges or lemon. ? Other foods and drinks that contain acid, like orange juice or  tomatoes. ? Spicy food. ? Chocolate.  Eat frequent small meals instead of three large meals a day. This helps prevent your stomach from getting too full. ? Eat slowly. ? Do not lie down right after eating. ? Do not eat 1-2 hours before bed.  Do not drink beverages with caffeine. These include cola, coffee, cocoa, and tea.  Do not drink alcohol. General instructions  Take over-the-counter and prescription medicines only as told by your health care provider.  Keep all follow-up visits as told by your health care provider. This is important. Contact a health care provider if:  Your symptoms are not controlled with medicines or lifestyle changes.  You are having trouble swallowing.  You have coughing or wheezing that will not go away. Get help right away if:  Your pain is getting worse.  Your pain spreads to your arms, neck, jaw, teeth, or back.  You have shortness of breath.  You sweat for no reason.  You feel sick to your stomach (nauseous) or you vomit.  You vomit blood.  You have bright red blood in your stools.  You have black, tarry stools. This information is not intended to replace advice given to you by your health care provider. Make sure you discuss any questions you have with your health care provider. Document Released: 11/11/2003 Document Revised: 08/14/2016 Document Reviewed: 08/14/2016 Elsevier Interactive Patient Education  [employer].

## 2017-07-31 ENCOUNTER — Encounter: Payer: Self-pay | Admitting: Gastroenterology

## 2017-07-31 ENCOUNTER — Ambulatory Visit: Payer: PPO | Admitting: Gastroenterology

## 2017-07-31 VITALS — BP 124/82 | HR 74 | Ht 64.0 in | Wt 142.5 lb

## 2017-07-31 DIAGNOSIS — R109 Unspecified abdominal pain: Secondary | ICD-10-CM | POA: Diagnosis not present

## 2017-07-31 DIAGNOSIS — R1310 Dysphagia, unspecified: Secondary | ICD-10-CM

## 2017-07-31 DIAGNOSIS — R142 Eructation: Secondary | ICD-10-CM

## 2017-07-31 DIAGNOSIS — K219 Gastro-esophageal reflux disease without esophagitis: Secondary | ICD-10-CM

## 2017-07-31 LAB — CBC WITH DIFFERENTIAL/PLATELET
Basophils Absolute: 50 cells/uL (ref 0–200)
Basophils Relative: 0.8 %
Eosinophils Absolute: 120 cells/uL (ref 15–500)
Eosinophils Relative: 1.9 %
HCT: 41.9 % (ref 35.0–45.0)
Hemoglobin: 14.3 g/dL (ref 11.7–15.5)
Lymphs Abs: 1651 cells/uL (ref 850–3900)
MCH: 30.1 pg (ref 27.0–33.0)
MCHC: 34.1 g/dL (ref 32.0–36.0)
MCV: 88.2 fL (ref 80.0–100.0)
MPV: 10.2 fL (ref 7.5–12.5)
Monocytes Relative: 10.5 %
Neutro Abs: 3818 cells/uL (ref 1500–7800)
Neutrophils Relative %: 60.6 %
Platelets: 215 10*3/uL (ref 140–400)
RBC: 4.75 10*6/uL (ref 3.80–5.10)
RDW: 12.9 % (ref 11.0–15.0)
Total Lymphocyte: 26.2 %
WBC mixed population: 662 cells/uL (ref 200–950)
WBC: 6.3 10*3/uL (ref 3.8–10.8)

## 2017-07-31 LAB — BASIC METABOLIC PANEL WITH GFR
BUN: 15 mg/dL (ref 7–25)
CO2: 29 mmol/L (ref 20–32)
Calcium: 9.3 mg/dL (ref 8.6–10.4)
Chloride: 107 mmol/L (ref 98–110)
Creat: 0.95 mg/dL (ref 0.50–0.99)
GFR, Est African American: 72 mL/min/{1.73_m2} (ref >=60)
GFR, Est Non African American: 62 mL/min/{1.73_m2} (ref >=60)
Glucose, Bld: 71 mg/dL (ref 65–99)
Potassium: 4.1 mmol/L (ref 3.5–5.3)
Sodium: 141 mmol/L (ref 135–146)

## 2017-07-31 LAB — HEPATIC FUNCTION PANEL
AG Ratio: 1.7 (calc) (ref 1.0–2.5)
ALT: 19 U/L (ref 6–29)
AST: 33 U/L (ref 10–35)
Albumin: 4.2 g/dL (ref 3.6–5.1)
Alkaline phosphatase (APISO): 88 U/L (ref 33–130)
Bilirubin, Direct: 0.1 mg/dL (ref 0.0–0.2)
Globulin: 2.5 g/dL (calc) (ref 1.9–3.7)
Indirect Bilirubin: 0.3 mg/dL (calc) (ref 0.2–1.2)
Total Bilirubin: 0.4 mg/dL (ref 0.2–1.2)
Total Protein: 6.7 g/dL (ref 6.1–8.1)

## 2017-07-31 LAB — LIPID PANEL
Cholesterol: 163 mg/dL (ref <200)
HDL: 49 mg/dL — ABNORMAL LOW (ref >50)
LDL Cholesterol (Calc): 87 mg/dL (calc)
Non-HDL Cholesterol (Calc): 114 mg/dL (calc) (ref <130)
Total CHOL/HDL Ratio: 3.3 (calc) (ref <5.0)
Triglycerides: 175 mg/dL — ABNORMAL HIGH (ref <150)

## 2017-07-31 LAB — MAGNESIUM: Magnesium: 2.4 mg/dL (ref 1.5–2.5)

## 2017-07-31 LAB — TSH: TSH: 1.74 mIU/L (ref 0.40–4.50)

## 2017-07-31 NOTE — Progress Notes (Addendum)
07/31/2017 Latoya [name] [id number] 1951/06/29   HISTORY OF PRESENT ILLNESS: This is a 66 year old female who is known to Dr. Arelia [name].  She presents here today with 2 different issues.  The first is complaints of left sided flank/back/abdominal pain.  This has been chronic and has suspected to be musculoskeletal in origin.  Her other issue is complaints of heartburn/indigestion/reflux along with belching, burning mid-chest pain, and intermittent dysphasia.  She says that sometimes certain foods are very slow to go down and she has to drink a lot of liquids to help it clear.  She has struggled with reflux issues in the past.  Currently she is not taking anything regularly for these symptoms.  She is only using Pepcid 20 mg as needed.  She says that due to her chronic kidney disease she is afraid to take much on a regular basis.  She had previously been on pantoprazole, omeprazole, and Zegerid in the past.  Her last EGD was in April 2014 at which time she was found to have a irregular appearance in her esophagus, but biopsies were normal.   Past Medical History:  Diagnosis Date  . Anxiety and depression   . Arthritis   . Colon polyps   . GERD (gastroesophageal reflux disease)   . Hemorrhoids   . Hiatal hernia   . History of kidney stones    passed  . Hyperlipemia   . Hypertension   . Kidney stones   . Mixed hyperlipidemia 11/26/2013  . Panic disorder   . Renal insufficiency    stage 3 kidney disease per her PCP  . Stomach ulcer   . Unspecified essential hypertension 11/26/2013   Past Surgical History:  Procedure Laterality Date  . ABDOMINAL HYSTERECTOMY    . CARPOMETACARPEL SUSPENSION PLASTY Right 12/08/2013   Procedure: CARPOMETACARPEL [facility]) SUSPENSION PLASTY;  Surgeon: Latoya Nap, MD;  Location: [place];  Service: Orthopedics;  Laterality: Right;  . COLONOSCOPY    . FOOT SURGERY Left 2008   Bunion and pulled tendons  . HAND SURGERY Right    Dog Bite    . I&D EXTREMITY Right 01/20/2017   Procedure: RIGHT HAND IRRIGATION AND DEBRIDEMENT AND REPAIR AS INDICATED;  Surgeon: Latoya Planas, MD;  Location: [place];  Service: Orthopedics;  Laterality: Right;  . OOPHORECTOMY Right   . TUBAL LIGATION    . UPPER GASTROINTESTINAL ENDOSCOPY      reports that she is a non-smoker but has been exposed to tobacco smoke. she has never used smokeless tobacco. She reports that she does not drink alcohol or use drugs. family history includes Asthma in her mother; Heart disease in her brother and father; Stroke in her brother. Allergies  Allergen Reactions  . Other     All pain medications: Unknown/ CODEINE  . Prednisone Other (See Comments)    DYSPHORIA  . Vioxx [Rofecoxib]     unknown  . Entex T [Pseudoephedrine-Guaifenesin] Palpitations  . Levaquin [Levofloxacin] Rash    Tolerated Avelox without adverse effect 11/08/15.  . Sulfa Antibiotics Rash  . Trovan [Alatrofloxacin] Rash      Outpatient Encounter Medications as of 07/31/2017  Medication Sig  . aspirin 81 MG tablet Take 81 mg by mouth at bedtime.   . Biotin 10000 MCG TABS Take by mouth daily.  . Cholecalciferol (VITAMIN D3) 5000 UNITS CAPS Take 5,000 Units by mouth daily.   . clonazePAM (KLONOPIN) 1 MG tablet Take 1 mg by mouth 2 (two) times daily  as needed for anxiety.   Marland Kitchen diltiazem (CARDIZEM) 120 MG tablet Take 120 mg by mouth 2 (two) times daily.   . famotidine (PEPCID) 20 MG tablet Take 20 mg by mouth daily.  . furosemide (LASIX) 20 MG tablet TAKE 1 TABLET BY MOUTH EVERY DAY  . potassium chloride SA (K-DUR,KLOR-CON) 20 MEQ tablet TAKE 1 TABLET BY MOUTH TWICE DAILY  . pravastatin (PRAVACHOL) 40 MG tablet TAKE 1 TABLET BY MOUTH EVERY NIGHT AT BEDTIME FOR CHOLESTEROL  . Probiotic Product (PROBIOTIC ACIDOPHILUS) CAPS Take 1 capsule by mouth 2 (two) times daily.   . sertraline (ZOLOFT) 100 MG tablet Take 50 mg by mouth daily.    Facility-Administered Encounter Medications as of 07/31/2017   Medication  . dexamethasone (DECADRON) injection 10 mg     REVIEW OF SYSTEMS  : All other systems reviewed and negative except where noted in the History of Present Illness.   PHYSICAL EXAM: BP 124/82   Pulse 74   Ht 5\' 4"  (1.626 m)   Wt 142 lb 8 oz (64.6 kg)   BMI 24.46 kg/m  General: Well developed white female in no acute distress Head: Normocephalic and atraumatic Eyes:  Sclerae anicteric, conjunctiva pink. Ears: Normal auditory acuity Lungs: Clear throughout to auscultation; no increased WOB. Heart: Regular rate and rhythm; no M/R/G. Abdomen: Soft, non-distended.  Normal bowel sounds.  Mild tenderness on left flank and deep in LLQ. Musculoskeletal: Symmetrical with no gross deformities  Skin: No lesions on visible extremities Extremities: No edema  Neurological: Alert oriented x 4, grossly non-focal Psychological:  Alert and cooperative. Normal mood and affect  ASSESSMENT AND PLAN: *Left flank pain:  Chronic.  Sounds musculoskeletal, which has been suspected all along.  Will return to ortho. *GERD, belching, intermittent dysphagia:  Not taking any medication regularly, only uses pepcid 20 mg prn when she has more severe symptoms.  She has been skeptical to take anything because of her CKD.  I have asked her to begin taking her pepcid 20 mg BID for now since she had done well with that in the past.  She will call back in 4-6 weeks if she continues to have symptoms at which time we could consider EGD and may need to change her medication (keeping in mind that she has already been on omeprazole, zegerid, and pantoprazole in the past as well).   CC:  Latoya Mutters, PA-C  Agree with Ms. Latoya [name] management.  Latoya Mayer, MD, Latoya [name]

## 2017-07-31 NOTE — Patient Instructions (Addendum)
If you are age 67 or older, your body mass index should be between 23-30. Your Body mass index is 24.46 kg/m. If this is out of the aforementioned range listed, please consider follow up with your Primary Care Provider.  If you are age 48 or younger, your body mass index should be between 19-25. Your Body mass index is 24.46 kg/m. If this is out of the aformentioned range listed, please consider follow up with your Primary Care Provider.   Please start Pepcid 20 mg over the counter twice a day.   Call back to the office as needed. [phone] you can ask for Almyra Free RN for Latoya [name] or Dr Celesta Aver nurse Barbera Setters  Thank you for choosing [facility]

## 2017-08-03 ENCOUNTER — Other Ambulatory Visit: Payer: Self-pay

## 2017-09-06 ENCOUNTER — Telehealth: Payer: Self-pay | Admitting: Physician Assistant

## 2017-09-06 MED ORDER — ALBUTEROL SULFATE HFA 108 (90 BASE) MCG/ACT IN AERS: 2.0000 | INHALATION_SPRAY | RESPIRATORY_TRACT | 0 refills | Status: DC | PRN

## 2017-09-06 MED ORDER — AZITHROMYCIN 250 MG PO TABS: ORAL_TABLET | ORAL | 1 refills | Status: AC

## 2017-09-06 MED ORDER — BENZONATATE 100 MG PO CAPS: 100.0000 mg | ORAL_CAPSULE | Freq: Three times a day (TID) | ORAL | 0 refills | Status: DC | PRN

## 2017-09-06 MED ORDER — DEXAMETHASONE 0.5 MG PO TABS: ORAL_TABLET | ORAL | 0 refills | Status: DC

## 2017-09-06 NOTE — Telephone Encounter (Signed)
Patient with sinus symptoms x 1 week, on mucinex without help. Has sinus drainage, pressure, cough, sore throat.   Will send in dexamethasone take for 2-3 days only, zpak, tessalon cough pill and albuterol, get on allergy pill and sinus spray.   If not better need OV If worse go to ER

## 2017-09-06 NOTE — Telephone Encounter (Signed)
Pt has been made aware of Rx to pick up at pharmacy.

## 2017-09-07 ENCOUNTER — Other Ambulatory Visit: Payer: Self-pay | Admitting: *Deleted

## 2017-09-07 MED ORDER — SERTRALINE HCL 100 MG PO TABS: 50.0000 mg | ORAL_TABLET | Freq: Every day | ORAL | 1 refills | Status: DC

## 2017-09-11 NOTE — Progress Notes (Signed)
Assessment and Plan:  Diagnoses and all orders for this visit:  Shortness of breath Vague, inconsistent - not with exertion but with speaking, pulmonology workup negative in past year. Has never been evaluated, never ECHO, ?intermittent palpitations with unremarkable recent EKG Possibly related to esophageal reflux but will r/o cardio -     DG Chest 2 View; Future -     Ambulatory referral to Cardiology  Intermittent palpitations -     Ambulatory referral to Cardiology  Hoarseness/cough Increase pepcid to BID for 2-3 weeks and follow up with progress  ?Viral URI with cough Unremarkable exam - Discussed the importance of avoiding unnecessary antibiotic therapy. Suggested symptomatic OTC remedies. Nasal saline spray for congestion/driness Nasal steroids, allergy pill Follow up as needed.  Further disposition pending results of labs. Discussed med's effects and SE's.   Over 15 minutes of exam, counseling, chart review, and critical decision making was performed.   Future Appointments  Date Time Provider [place]  11/27/2017  9:45 AM Unk Pinto, MD [facility] None  11/30/2017  7:50 AM [facility] DIAG [place] 3 [facility]  11/30/2017  8:00 AM [facility] [place] 3 [facility]    ------------------------------------------------------------------------------------------------------------------   HPI BP 120/68   Pulse 80   Temp (!) 97.4 F (36.3 C)   Ht 5\' 4"  (1.626 m)   Wt 141 lb (64 kg)   SpO2 98%   BMI 24.20 kg/m   67 y.o.female presents for ongoing symptoms.  On 1/3 she contacted the office reporting sinus symptoms x 1 week, on mucinex without help. Has sinus drainage, pressure, cough, sore throat. 2-3 day course of Dexamethasone, zpak, tessalon cough pill and albuterol, get on allergy pill and sinus spray were recommended and prescribed.   She reports her nose is now very dry, still having cough, hoarseness, fatigue, mild dyspnea after full sentences,  though denies dyspnea with exertion. She endorses chest tightness, and a sense of fast heart beat/palpitations after trying to talk. She reports she did try the albuterol that was prescribed but felt very jittery and stopped. She could not tell if it helped her symptoms. Exam for URI symptoms is unremarkable today.   She was evaluated by Dr. Ashok Cordia - pulmonology - 08/2016 - spirometry and CXR were negative at that time. She does not have a dx of CHF - no ECHO of cardiology.   Past Medical History:  Diagnosis Date  . Anxiety and depression   . Arthritis   . Colon polyps   . GERD (gastroesophageal reflux disease)   . Hemorrhoids   . Hiatal hernia   . History of kidney stones    passed  . Hyperlipemia   . Hypertension   . Kidney stones   . Mixed hyperlipidemia 11/26/2013  . Panic disorder   . Renal insufficiency    stage 3 kidney disease per her PCP  . Stomach ulcer   . Unspecified essential hypertension 11/26/2013      Allergies  Allergen Reactions  . Other     All pain medications: Unknown/ CODEINE  . Prednisone Other (See Comments)    DYSPHORIA  . Vioxx [Rofecoxib]     unknown  . Entex T [Pseudoephedrine-Guaifenesin] Palpitations  . Levaquin [Levofloxacin] Rash    Tolerated Avelox without adverse effect 11/08/15.  . Sulfa Antibiotics Rash  . Trovan [Alatrofloxacin] Rash    Current Outpatient Medications on File Prior to Visit  Medication Sig  . albuterol (VENTOLIN HFA) 108 (90 Base) MCG/ACT inhaler Inhale 2 puffs into  the lungs every 4 (four) hours as needed for wheezing or shortness of breath.  Marland Kitchen aspirin 81 MG tablet Take 81 mg by mouth at bedtime.   . Biotin 10000 MCG TABS Take by mouth daily.  . Cholecalciferol (VITAMIN D3) 5000 UNITS CAPS Take 5,000 Units by mouth daily.   . clonazePAM (KLONOPIN) 1 MG tablet Take 1 mg by mouth 2 (two) times daily as needed for anxiety.   Marland Kitchen dexamethasone (DECADRON) 0.5 MG tablet take 1 tablet PO for 3-5 days.  Marland Kitchen diltiazem (CARDIZEM)  120 MG tablet Take 120 mg by mouth 2 (two) times daily.   . famotidine (PEPCID) 20 MG tablet Take 20 mg by mouth daily.  . furosemide (LASIX) 20 MG tablet TAKE 1 TABLET BY MOUTH EVERY DAY  . potassium chloride SA (K-DUR,KLOR-CON) 20 MEQ tablet TAKE 1 TABLET BY MOUTH TWICE DAILY  . pravastatin (PRAVACHOL) 40 MG tablet TAKE 1 TABLET BY MOUTH EVERY NIGHT AT BEDTIME FOR CHOLESTEROL  . Probiotic Product (PROBIOTIC ACIDOPHILUS) CAPS Take 1 capsule by mouth 2 (two) times daily.   . sertraline (ZOLOFT) 100 MG tablet Take 0.5 tablets (50 mg total) by mouth daily.  . benzonatate (TESSALON PERLES) 100 MG capsule Take 1 capsule (100 mg total) by mouth 3 (three) times daily as needed for cough (Max: 600mg  per day). (Patient not taking: Reported on 09/12/2017)   Current Facility-Administered Medications on File Prior to Visit  Medication  . dexamethasone (DECADRON) injection 10 mg    ROS: Review of Systems  Constitutional: Negative for chills, diaphoresis, fever, malaise/fatigue and weight loss.  HENT: Negative for hearing loss, sore throat and tinnitus.        Hoarseness  Eyes: Negative for blurred vision and double vision.  Respiratory: Positive for cough and shortness of breath. Negative for sputum production and wheezing.   Cardiovascular: Positive for palpitations. Negative for chest pain, orthopnea, claudication and leg swelling.  Gastrointestinal: Negative for abdominal pain, blood in stool, constipation, diarrhea, heartburn, melena, nausea and vomiting.       No dysphagia  Genitourinary: Negative.   Musculoskeletal: Negative for joint pain and myalgias.  Skin: Negative for rash.  Neurological: Negative for dizziness, tingling, sensory change, weakness and headaches.  Endo/Heme/Allergies: Negative for polydipsia.  Psychiatric/Behavioral: Negative.   All other systems reviewed and are negative.    Physical Exam:  BP 120/68   Pulse 80   Temp (!) 97.4 F (36.3 C)   Ht 5\' 4"  (1.626 m)   Wt  141 lb (64 kg)   SpO2 98%   BMI 24.20 kg/m   General Appearance: Well nourished, in no apparent distress. Eyes: PERRLA, EOMs, conjunctiva no swelling or erythema Sinuses: No Frontal/maxillary tenderness ENT/Mouth: Ext aud canals clear, TMs without erythema, bulging. No erythema, swelling, or exudate on post pharynx.  Tonsils not swollen or erythematous. Hearing normal. Hoarse.  Neck: Supple, thyroid normal.  Respiratory: Respiratory effort normal, BS equal bilaterally without rales, rhonchi, wheezing or stridor.  Cardio: Rhythm irregular, normal S1/S2 with no audible murmurs. 2+ symmetrical peripheral pulses without notable edema.  Abdomen: Soft, + BS.  Non tender, no guarding, rebound, hernias, masses. Lymphatics: Non tender without lymphadenopathy.  Musculoskeletal: Full ROM, 5/5 strength, normal gait.  Skin: Warm, dry without rashes, lesions, ecchymosis.  Neuro: Cranial nerves intact. Normal muscle tone, no cerebellar symptoms. Sensation intact.  Psych: Awake and oriented X 3, normal affect, Insight and Judgment appropriate.     Izora Ribas, NP 12:41 PM [facility]

## 2017-09-12 ENCOUNTER — Encounter: Payer: Self-pay | Admitting: Adult Health

## 2017-09-12 ENCOUNTER — Ambulatory Visit (INDEPENDENT_AMBULATORY_CARE_PROVIDER_SITE_OTHER): Payer: PPO | Admitting: Adult Health

## 2017-09-12 ENCOUNTER — Ambulatory Visit (HOSPITAL_COMMUNITY)
Admission: RE | Admit: 2017-09-12 | Discharge: 2017-09-12 | Disposition: A | Discharge status: Home / Self Care | Payer: PPO | Source: Ambulatory Visit | Attending: Adult Health | Admitting: Adult Health

## 2017-09-12 VITALS — BP 120/68 | HR 80 | Temp 97.4°F | Ht 64.0 in | Wt 141.0 lb

## 2017-09-12 DIAGNOSIS — R490 Dysphonia: Secondary | ICD-10-CM

## 2017-09-12 DIAGNOSIS — R0602 Shortness of breath: Secondary | ICD-10-CM | POA: Insufficient documentation

## 2017-09-12 DIAGNOSIS — R05 Cough: Secondary | ICD-10-CM | POA: Insufficient documentation

## 2017-09-12 DIAGNOSIS — R002 Palpitations: Secondary | ICD-10-CM | POA: Diagnosis not present

## 2017-09-12 DIAGNOSIS — B9789 Other viral agents as the cause of diseases classified elsewhere: Secondary | ICD-10-CM

## 2017-09-12 DIAGNOSIS — J069 Acute upper respiratory infection, unspecified: Secondary | ICD-10-CM

## 2017-09-12 NOTE — Patient Instructions (Signed)

## 2017-09-12 NOTE — Addendum Note (Signed)
Addended by: Izora Ribas on: 09/12/2017 01:44 PM   Modules accepted: Orders

## 2017-09-14 ENCOUNTER — Telehealth: Payer: Self-pay | Admitting: Internal Medicine

## 2017-09-14 NOTE — Telephone Encounter (Signed)
Patient had last dilation in 2014.  She will come in and discuss on 09/21/17 2:15 with Dr. Carlean Purl

## 2017-09-21 ENCOUNTER — Ambulatory Visit (INDEPENDENT_AMBULATORY_CARE_PROVIDER_SITE_OTHER): Payer: PPO | Admitting: Internal Medicine

## 2017-09-21 ENCOUNTER — Encounter: Payer: Self-pay | Admitting: Internal Medicine

## 2017-09-21 VITALS — BP 112/74 | HR 68 | Ht 63.0 in | Wt 142.5 lb

## 2017-09-21 DIAGNOSIS — K219 Gastro-esophageal reflux disease without esophagitis: Secondary | ICD-10-CM

## 2017-09-21 DIAGNOSIS — R1310 Dysphagia, unspecified: Secondary | ICD-10-CM | POA: Diagnosis not present

## 2017-09-21 NOTE — Progress Notes (Signed)
Latoya [name] 67 y.o. [birth year] [id number]  Assessment & Plan:   Encounter Diagnoses  Name Primary?  Marland Kitchen Dysphagia, unspecified type Yes  . Gastroesophageal reflux disease, esophagitis presence not specified     1. Dysphagia- Recommend EGD with possible dilation if needed, counseled on risks and possible complications, pt is agreeable.   2. GERD- Pt is unwilling to take PPI over potential concerns about kidney injuery. Will revisit after endoscopy.    I have seen the patient with [name] and she has served as a Education administrator.  Latoya Mayer, MD, [facility]  [id number], Gwyndolyn Saxon, MD   Subjective:   Chief Complaint: Dysphagia  HPI Pt is a 67 yo F presenting to the office today with cc of worsening dysphagia. Sxs started about a month or two ago, she was seen by Janett Billow in Nov and was rx'd pepcid but pt reports no improvement in sxs. She reports dysphagia with solid foods, in the pharyngoesophageal region, hoarseness, and weight loss without trying although her records show fairly stable weights for the past year.   Pt's brother has been recently diagnosed with pancreatic CA, lives in a facility in [place], [place] she is concerned about her own health and was appropriately reassured.  Allergies  Allergen Reactions  . Other     All pain medications: Unknown/ CODEINE  . Prednisone Other (See Comments)    DYSPHORIA  . Vioxx [Rofecoxib]     unknown  . Entex T [Pseudoephedrine-Guaifenesin] Palpitations  . Levaquin [Levofloxacin] Rash    Tolerated Avelox without adverse effect 11/08/15.  . Sulfa Antibiotics Rash  . Trovan [Alatrofloxacin] Rash   Current Meds  Medication Sig  . aspirin 81 MG tablet Take 81 mg by mouth at bedtime.   . Biotin 10000 MCG TABS Take by mouth daily.  . cholecalciferol (VITAMIN D) 1000 units tablet Take 1,000 Units by mouth daily.  . clonazePAM (KLONOPIN) 1 MG tablet Take 1 mg by mouth 2 (two) times daily as needed for anxiety.   Marland Kitchen diltiazem  (CARDIZEM) 120 MG tablet Take 120 mg by mouth 2 (two) times daily.   . famotidine (PEPCID) 20 MG tablet Take 20 mg by mouth daily.  . furosemide (LASIX) 20 MG tablet TAKE 1 TABLET BY MOUTH EVERY DAY  . potassium chloride SA (K-DUR,KLOR-CON) 20 MEQ tablet TAKE 1 TABLET BY MOUTH TWICE DAILY  . pravastatin (PRAVACHOL) 40 MG tablet TAKE 1 TABLET BY MOUTH EVERY NIGHT AT BEDTIME FOR CHOLESTEROL  . Probiotic Product (PROBIOTIC ACIDOPHILUS) CAPS Take 1 capsule by mouth 2 (two) times daily.   . sertraline (ZOLOFT) 100 MG tablet Take 0.5 tablets (50 mg total) by mouth daily.   Current Facility-Administered Medications for the 09/21/17 encounter (Office Visit) with Latoya Mayer, MD  Medication  . dexamethasone (DECADRON) injection 10 mg   Past Medical History:  Diagnosis Date  . Anxiety and depression   . Arthritis   . Colon polyps   . GERD (gastroesophageal reflux disease)   . Hemorrhoids   . Hiatal hernia   . History of kidney stones    passed  . Hyperlipemia   . Hypertension   . Kidney stones   . Mixed hyperlipidemia 11/26/2013  . Panic disorder   . Renal insufficiency    stage 3 kidney disease per her PCP  . Stomach ulcer   . Unspecified essential hypertension 11/26/2013   Past Surgical History:  Procedure Laterality Date  . ABDOMINAL HYSTERECTOMY    . CARPOMETACARPEL SUSPENSION PLASTY Right 12/08/2013  Procedure: CARPOMETACARPEL [facility]) SUSPENSION PLASTY;  Surgeon: Jolyn Nap, MD;  Location: [facility];  Service: Orthopedics;  Laterality: Right;  . COLONOSCOPY    . FOOT SURGERY Left 2008   Bunion and pulled tendons  . HAND SURGERY Right    Dog Bite  . I&D EXTREMITY Right 01/20/2017   Procedure: RIGHT HAND IRRIGATION AND DEBRIDEMENT AND REPAIR AS INDICATED;  Surgeon: Iran Planas, MD;  Location: [facility];  Service: Orthopedics;  Laterality: Right;  . OOPHORECTOMY Right   . TUBAL LIGATION    . UPPER GASTROINTESTINAL ENDOSCOPY     Social History   Social  History Narrative   Originally from [place]. Has always lived in [place]. Previously has traveled to [place]. No international travel. Enjoys planting gardens and working in her flowers. 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 No bird, mold, or hot tub exposure.    family history includes Asthma in her mother; Heart disease in her brother and father; Stroke in her brother.   Review of Systems Positive for dysphagia, hoarseness, mild heartburn, decrease in appetite, weight loss. Negative for all other symptoms and systems.  Objective:   Physical Exam  @BP  112/74 (BP Location: Left Arm, Patient Position: Sitting, Cuff Size: Normal)   Pulse 68   Ht 5\' 3"  (1.6 m)   Wt 142 lb 8 oz (64.6 kg)   BMI 25.24 kg/m @  General:  NAD Neck:   No masses, no LAD or thyromegaly Throat:  Mild erythema in the pharynx Eyes:   anicteric Lungs:  CTAB Heart::  S1S2 no rubs, murmurs or gallops Abdomen:  Soft and diffusely tender, BS+, no rebound or guarding   Data Reviewed: prior notes, meds.

## 2017-09-21 NOTE — Patient Instructions (Signed)
  You have been scheduled for an endoscopy. Please follow written instructions given to you at your visit today. If you use inhalers (even only as needed), please bring them with you on the day of your procedure.   I appreciate the opportunity to care for you. Carl Gessner, MD, FACG 

## 2017-09-24 ENCOUNTER — Ambulatory Visit (INDEPENDENT_AMBULATORY_CARE_PROVIDER_SITE_OTHER): Payer: PPO | Admitting: Physician Assistant

## 2017-09-24 ENCOUNTER — Encounter: Payer: Self-pay | Admitting: Physician Assistant

## 2017-09-24 VITALS — BP 132/80 | HR 91 | Temp 97.5°F | Resp 16 | Ht 63.0 in | Wt 141.0 lb

## 2017-09-24 DIAGNOSIS — R059 Cough, unspecified: Secondary | ICD-10-CM

## 2017-09-24 DIAGNOSIS — N183 Chronic kidney disease, stage 3 unspecified: Secondary | ICD-10-CM

## 2017-09-24 DIAGNOSIS — R05 Cough: Secondary | ICD-10-CM

## 2017-09-24 DIAGNOSIS — J310 Chronic rhinitis: Secondary | ICD-10-CM

## 2017-09-24 DIAGNOSIS — E559 Vitamin D deficiency, unspecified: Secondary | ICD-10-CM

## 2017-09-24 DIAGNOSIS — I10 Essential (primary) hypertension: Secondary | ICD-10-CM

## 2017-09-24 DIAGNOSIS — F411 Generalized anxiety disorder: Secondary | ICD-10-CM | POA: Diagnosis not present

## 2017-09-24 DIAGNOSIS — I700 Atherosclerosis of aorta: Secondary | ICD-10-CM

## 2017-09-24 DIAGNOSIS — Z79899 Other long term (current) drug therapy: Secondary | ICD-10-CM

## 2017-09-24 DIAGNOSIS — E782 Mixed hyperlipidemia: Secondary | ICD-10-CM

## 2017-09-24 DIAGNOSIS — R6889 Other general symptoms and signs: Secondary | ICD-10-CM

## 2017-09-24 DIAGNOSIS — R7309 Other abnormal glucose: Secondary | ICD-10-CM | POA: Diagnosis not present

## 2017-09-24 DIAGNOSIS — K449 Diaphragmatic hernia without obstruction or gangrene: Secondary | ICD-10-CM | POA: Diagnosis not present

## 2017-09-24 DIAGNOSIS — E041 Nontoxic single thyroid nodule: Secondary | ICD-10-CM | POA: Diagnosis not present

## 2017-09-24 DIAGNOSIS — Z0001 Encounter for general adult medical examination with abnormal findings: Secondary | ICD-10-CM | POA: Diagnosis not present

## 2017-09-24 DIAGNOSIS — Z0000 Encounter for general adult medical examination without abnormal findings: Secondary | ICD-10-CM

## 2017-09-24 DIAGNOSIS — K219 Gastro-esophageal reflux disease without esophagitis: Secondary | ICD-10-CM | POA: Diagnosis not present

## 2017-09-24 LAB — CBC WITH DIFFERENTIAL/PLATELET
Basophils Absolute: 32 cells/uL (ref 0–200)
Basophils Relative: 0.5 %
Eosinophils Absolute: 160 cells/uL (ref 15–500)
Eosinophils Relative: 2.5 %
HCT: 40.6 % (ref 35.0–45.0)
Hemoglobin: 13.7 g/dL (ref 11.7–15.5)
Lymphs Abs: 1824 cells/uL (ref 850–3900)
MCH: 29.5 pg (ref 27.0–33.0)
MCHC: 33.7 g/dL (ref 32.0–36.0)
MCV: 87.5 fL (ref 80.0–100.0)
MPV: 10.1 fL (ref 7.5–12.5)
Monocytes Relative: 9.4 %
Neutro Abs: 3782 cells/uL (ref 1500–7800)
Neutrophils Relative %: 59.1 %
Platelets: 192 10*3/uL (ref 140–400)
RBC: 4.64 10*6/uL (ref 3.80–5.10)
RDW: 13 % (ref 11.0–15.0)
Total Lymphocyte: 28.5 %
WBC mixed population: 602 cells/uL (ref 200–950)
WBC: 6.4 10*3/uL (ref 3.8–10.8)

## 2017-09-24 MED ORDER — LEVALBUTEROL HCL 1.25 MG/3ML IN NEBU: 1.2500 mg | INHALATION_SOLUTION | Freq: Three times a day (TID) | RESPIRATORY_TRACT | 12 refills | Status: DC | PRN

## 2017-09-24 MED ORDER — DEXAMETHASONE 0.5 MG PO TABS: ORAL_TABLET | ORAL | 0 refills | Status: DC

## 2017-09-24 NOTE — Patient Instructions (Signed)
Cough, Adult  Coughing is a reflex that clears your throat and your airways. Coughing helps to heal and protect your lungs. It is normal to cough occasionally, but a cough that happens with other symptoms or lasts a long time may be a sign of a condition that needs treatment. A cough may last only 2-3 weeks (acute), or it may last longer than 8 weeks (chronic).  What are the causes?  Coughing is commonly caused by:   Breathing in substances that irritate your lungs.   A viral or bacterial respiratory infection.   Allergies.   Asthma.   Postnasal drip.   Smoking.   Acid backing up from the stomach into the esophagus (gastroesophageal reflux).   Certain medicines.   Chronic lung problems, including COPD (or rarely, lung cancer).   Other medical conditions such as heart failure.    Follow these instructions at home:  Pay attention to any changes in your symptoms. Take these actions to help with your discomfort:   Take medicines only as told by your health care provider.  ? If you were prescribed an antibiotic medicine, take it as told by your health care provider. Do not stop taking the antibiotic even if you start to feel better.  ? Talk with your health care provider before you take a cough suppressant medicine.   Drink enough fluid to keep your urine clear or pale yellow.   If the air is dry, use a cold steam vaporizer or humidifier in your bedroom or your home to help loosen secretions.   Avoid anything that causes you to cough at work or at home.   If your cough is worse at night, try sleeping in a semi-upright position.   Avoid cigarette smoke. If you smoke, quit smoking. If you need help quitting, ask your health care provider.   Avoid caffeine.   Avoid alcohol.   Rest as needed.    Contact a health care provider if:   You have new symptoms.   You cough up pus.   Your cough does not get better after 2-3 weeks, or your cough gets worse.   You cannot control your cough with suppressant  medicines and you are losing sleep.   You develop pain that is getting worse or pain that is not controlled with pain medicines.   You have a fever.   You have unexplained weight loss.   You have night sweats.  Get help right away if:   You cough up blood.   You have difficulty breathing.   Your heartbeat is very fast.  This information is not intended to replace advice given to you by your health care provider. Make sure you discuss any questions you have with your health care provider.  Document Released: 02/17/2011 Document Revised: 01/27/2016 Document Reviewed: 10/28/2014  Elsevier Interactive Patient Education  2018 [employer] Inc[employer]

## 2017-09-24 NOTE — Progress Notes (Signed)
MEDICARE WELLNESS  Assessment and Plan:   Cough Normal CXR, no fever, chills, has had zpak, likely more post inflammatory, can not tolerate albuterol due to palpitations, will send in xopenex for her nebulizer that she has at home, decadron, and follow up GI for EGD  Essential hypertension - continue medications, DASH diet, exercise and monitor at home. Call if greater than 130/80.  -     CBC with Differential/Platelet -     Hepatic function panel -     BASIC METABOLIC PANEL WITH GFR -     TSH  Aortic atherosclerosis (HCC) Control blood pressure, cholesterol, glucose, increase exercise.   Thyroid nodule Hypothyroidism-check TSH level, continue medications the same, reminded to take on an empty stomach 30-23mins before food.   CKD (chronic kidney disease) stage 3, GFR 30-59 ml/min Increase fluids, avoid NSAIDS, monitor sugars, will monitor  Mixed hyperlipidemia -continue medications, check lipids, decrease fatty foods, increase activity.  -     Lipid panel  Other abnormal glucose Discussed general issues about diabetes pathophysiology and management., Educational material distributed., Suggested low cholesterol diet., Encouraged aerobic exercise., Discussed foot care., Reminded to get yearly retinal exam.  Medication management -     Magnesium  Hiatal hernia Get on pepcid x 2 weeks  Chronic rhinitis - Allegra OTC, increase H20, allergy hygiene explained.  Gastroesophageal reflux disease, esophagitis presence not specified Continue PPI/H2 blocker, diet discussed  Generalized anxiety disorder continue medications, stress management techniques discussed, increase water, good sleep hygiene discussed, increase exercise, and increase veggies.   Vitamin D deficiency  Encounter for Medicare annual wellness exam 1 year  UTD  Over 40 minutes of exam, counseling, chart review and critical decision making was performed Future Appointments  Date Time Provider [place]  10/11/2017 10:00 AM Lelon Perla, MD [facility]  11/01/2017 10:00 AM Gatha Mayer, MD [facility]  11/27/2017  9:45 AM Unk Pinto, MD [facility] None  11/30/2017  7:50 AM [facility] DIAG [place] 3 [facility]  11/30/2017  8:00 AM GI-[facility] [place] 3 GI-[facility]    Plan:   During the course of the visit the patient was educated and counseled about appropriate screening and preventive services including:    Pneumococcal vaccine   Prevnar 13  Influenza vaccine  Td vaccine  Screening electrocardiogram  Bone densitometry screening  Colorectal cancer screening  Diabetes screening  Glaucoma screening  Nutrition counseling   Advanced directives: requested    Subjective:  Latoya [name] is a 67 y.o. female who presents for medicare wellness and acute visit for cough.   Was seen in the office 01/09 for cough and hoarseness, she also complained of SOB, she was sent to cardiology for possible echo and seeing Dr. Carlean Purl for EGD, and given pepcid and supportive care for cough. Has had zpak x 2 and dexamethasone prior to the 01/09 visit and states she is still feeling, nonproductive cough, chest tightness, when she talks she feels short of breath, with hoarseness. Normal CXR 09/12/2017, CT chest 12/24/2015 showing large hiatal hernia but normal Ct otherwise. She can not tolerate the albuterol due to palpations and is due to see cardiology due to palpitations/SOB.   Her blood pressure has been controlled at home, today their BP is BP: 132/80  She does workout, walking, yard work, house work. She denies chest pain, shortness of breath, dizziness.  She is on cholesterol medication and denies myalgias. Her cholesterol is not at goal. The cholesterol last visit  was:   Lab Results  Component Value Date   CHOL 163 07/30/2017   HDL 49 (L) 07/30/2017   LDLCALC 88 12/20/2016   TRIG 175 (H) 07/30/2017   CHOLHDL 3.3 07/30/2017   She is on zoloft and on  klonopin rarely for anxiety/depression.   She has been working on diet and exercise for prediabetes, and denies paresthesia of the feet, polydipsia, polyuria and visual disturbances. Last A1C in the office was:  Lab Results  Component Value Date   HGBA1C 5.6 06/30/2016   Last GFR: Lab Results  Component Value Date   GFRNONAA 62 07/30/2017   Patient is on Vitamin D supplement.   Lab Results  Component Value Date   VD25OH 61 06/30/2016     BMI is Body mass index is 24.98 kg/m., she is working on diet and exercise. Wt Readings from Last 3 Encounters:  09/24/17 141 lb (64 kg)  09/21/17 142 lb 8 oz (64.6 kg)  09/12/17 141 lb (64 kg)     Medication Review: Current Outpatient Medications on File Prior to Visit  Medication Sig  . aspirin 81 MG tablet Take 81 mg by mouth at bedtime.   . Biotin 10000 MCG TABS Take by mouth daily.  . cholecalciferol (VITAMIN D) 1000 units tablet Take 1,000 Units by mouth daily.  . clonazePAM (KLONOPIN) 1 MG tablet Take 1 mg by mouth 2 (two) times daily as needed for anxiety.   Marland Kitchen diltiazem (CARDIZEM) 120 MG tablet Take 120 mg by mouth 2 (two) times daily.   . famotidine (PEPCID) 20 MG tablet Take 20 mg by mouth daily.  . furosemide (LASIX) 20 MG tablet TAKE 1 TABLET BY MOUTH EVERY DAY  . potassium chloride SA (K-DUR,KLOR-CON) 20 MEQ tablet TAKE 1 TABLET BY MOUTH TWICE DAILY  . pravastatin (PRAVACHOL) 40 MG tablet TAKE 1 TABLET BY MOUTH EVERY NIGHT AT BEDTIME FOR CHOLESTEROL  . Probiotic Product (PROBIOTIC ACIDOPHILUS) CAPS Take 1 capsule by mouth 2 (two) times daily.   . sertraline (ZOLOFT) 100 MG tablet Take 0.5 tablets (50 mg total) by mouth daily.   No current facility-administered medications on file prior to visit.      Allergies  Allergen Reactions  . Other     All pain medications: Unknown/ CODEINE  . Prednisone Other (See Comments)    DYSPHORIA  . Vioxx [Rofecoxib]     unknown  . Entex T [Pseudoephedrine-Guaifenesin] Palpitations  .  Levaquin [Levofloxacin] Rash    Tolerated Avelox without adverse effect 11/08/15.  . Sulfa Antibiotics Rash  . Trovan [Alatrofloxacin] Rash    Current Problems (verified) Patient Active Problem List   Diagnosis Date Noted  . Dysphagia 07/31/2017  . Chronic rhinitis 10/09/2016  . Hiatal hernia 07/14/2016  . Encounter for Medicare annual wellness exam 06/30/2016  . Aortic atherosclerosis ([place]) 06/30/2016  . CKD (chronic kidney disease) stage 3, GFR 30-59 ml/min (HCC) 06/30/2016  . Thyroid nodule 12/24/2015  . Other abnormal glucose 05/01/2014  . Vitamin D deficiency 05/01/2014  . Medication management 05/01/2014  . Essential hypertension 11/26/2013  . Mixed hyperlipidemia 11/26/2013  . Generalized anxiety disorder 07/11/2013  . Esophageal reflux 11/29/2012    Screening Tests Immunization History  Administered Date(s) Administered  . Influenza Split 06/04/2014, 06/05/2015  . Influenza, High Dose Seasonal PF 05/30/2017  . Influenza-Unspecified 06/06/2013, 06/13/2016  . Pneumococcal Conjugate-13 06/05/2015  . Pneumococcal Polysaccharide-23 04/06/2014  . Pneumococcal-Unspecified 09/04/1996  . Td 09/04/2005  . Tdap 03/22/2017   Preventative care: Last colonoscopy: 12/2012 Carlean Purl  EGD 12/2012 Last mammogram: 11/2016 and getting diagnostic 05/2017 Last pap smear/pelvic exam: 2016 normal  DEXA: dec 2014 PFT 02/2016 [place] AB 02/2015 US thyroid 05/2017 need repeat 1 year CT chest 12/24/2015  Prior vaccinations: TD or Tdap: 2018  Influenza: 2017 Pneumococcal: 2015 Prevnar13: 2016 Shingles/Zostavax: declines due to cost  Names of Other Physician/Practitioners you currently use: 1. [facility] here for primary care 2. [facility], eye doctor, last visit Nov 2017 3. Ladell Pier, dentist, last visit 2018 q 6 months Patient Care Team: Unk Pinto, MD as PCP - General (Internal Medicine) Aquilla Hacker, MD as Referring Physician  (Psychiatry) Jannette Spanner, MD as Referring Physician (Dermatology)  SURGICAL HISTORY She  has a past surgical history that includes Abdominal hysterectomy; Tubal ligation; Foot surgery (Left, 2008); Colonoscopy; Upper gastrointestinal endoscopy; Oophorectomy (Right); Carpometacarpel [facility]) suspension plasty (Right, 12/08/2013); I&D extremity (Right, 01/20/2017); and Hand surgery (Right). FAMILY HISTORY Her family history includes Asthma in her mother; Heart disease in her brother and father; Stroke in her brother. SOCIAL HISTORY She  reports that she is a non-smoker but has been exposed to tobacco smoke. she has never used smokeless tobacco. She reports that she does not drink alcohol or use drugs.  MEDICARE WELLNESS OBJECTIVES: Physical activity: Current Exercise Habits: The patient does not participate in regular exercise at present Cardiac risk factors: Cardiac Risk Factors include: advanced age (>71men, >51 women);dyslipidemia;hypertension;sedentary lifestyle Depression/mood screen:   Depression screen [facility] 2/9 09/24/2017  Decreased Interest 0  Down, Depressed, Hopeless 0  PHQ - 2 Score 0    ADLs:  In your present state of health, do you have any difficulty performing the following activities: 09/24/2017 07/30/2017  Hearing? N N  Vision? N N  Difficulty concentrating or making decisions? Tempie Donning  Walking or climbing stairs? N N  Dressing or bathing? N N  Doing errands, shopping? N N  Some recent data might be hidden     Cognitive Testing  Alert? Yes  Normal Appearance?Yes  Oriented to person? Yes  Place? Yes   Time? Yes  Recall of three objects?  Yes  Can perform simple calculations? Yes  Displays appropriate judgment?Yes  Can read the correct time from a watch face?Yes  EOL planning: Does Patient Have a Medical Advance Directive?: Yes Type of Advance Directive: Healthcare Power of Attorney, Living will Does patient want to make changes to medical advance directive?: No -  Patient declined   Review of Systems  Constitutional: Negative.  Negative for chills and fever.  HENT: Negative.   Eyes: Negative.   Respiratory: Positive for cough and shortness of breath. Negative for hemoptysis, sputum production and wheezing.   Cardiovascular: Positive for chest pain (tightness) and palpitations. Negative for orthopnea, claudication, leg swelling and PND.  Gastrointestinal: Negative.   Genitourinary: Negative.   Musculoskeletal: Negative.   Skin: Negative.   Neurological: Negative for dizziness, tingling, tremors, sensory change, speech change, focal weakness, seizures, loss of consciousness and headaches.  Endo/Heme/Allergies: Negative.   Psychiatric/Behavioral: Negative.  Negative for depression and substance abuse. The patient does not have insomnia.     Objective:     Today's Vitals   09/24/17 1445  BP: 132/80  Pulse: 91  Resp: 16  Temp: (!) 97.5 F (36.4 C)  SpO2: 97%  Weight: 141 lb (64 kg)  Height: 5\' 3"  (1.6 m)   Body mass index is 24.98 kg/m.  General appearance: alert, no distress, WD/WN, female HEENT: normocephalic, sclerae anicteric, TMs pearly, nares patent,  no discharge or erythema, pharynx normal Oral cavity: MMM, no lesions Neck: supple, no lymphadenopathy, no thyromegaly, no masses Heart: RRR, normal S1, S2, no murmurs Lungs: CTA bilaterally, no wheezes, rhonchi, or rales Abdomen: +bs, soft, Left upper quadrant tenderness, non distended, no masses, no hepatomegaly, no splenomegaly Musculoskeletal: nontender, no swelling, no obvious deformity Extremities: no edema, no cyanosis, no clubbing Pulses: 2+ symmetric, upper and lower extremities, normal cap refill Neurological: alert, oriented x 3, CN2-12 intact, strength normal upper extremities and lower extremities, sensation normal throughout, DTRs 2+  no cerebellar signs, gait normal Psychiatric: normal affect, behavior normal, pleasant    Medicare Attestation I have personally  reviewed: The patient's medical and social history Their use of alcohol, tobacco or illicit drugs Their current medications and supplements The patient's functional ability including ADLs,fall risks, home safety risks, cognitive, and hearing and visual impairment Diet and physical activities Evidence for depression or mood disorders  The patient's weight, height, BMI, and visual acuity have been recorded in the chart.  I have made referrals, counseling, and provided education to the patient based on review of the above and I have provided the patient with a written personalized care plan for preventive services.     [name], PA-C   09/24/2017

## 2017-10-02 NOTE — Progress Notes (Signed)
Referring-Ashley Corbett NP Reason for referral-chest pain and dyspnea  HPI: 67 yo female for evaluation of palpitations and dyspnea at request of [name] NP. Labs 11/18 showed Mg 2.4, TSH 1.74 and K 4.1.  Patient states that for the past several months she has had occasional chest pain.  It is described as an aching sensation without radiation or associated symptoms.  Occurs both with exertion and at rest.  Resolves spontaneously.  It is not pleuritic or related to food.  She notices it more with stress.  She does notes increased dyspnea on exertion but no orthopnea, PND.  Chronic mild pedal edema.  Because of the above we were asked to evaluate.  Also note she has occasional brief skip but no sustained palpitations and no syncope.  Current Outpatient Medications  Medication Sig Dispense Refill  . aspirin 81 MG tablet Take 81 mg by mouth at bedtime.     . Biotin 10000 MCG TABS Take by mouth daily.    . cholecalciferol (VITAMIN D) 1000 units tablet Take 1,000 Units by mouth daily.    . clonazePAM (KLONOPIN) 1 MG tablet Take 1 mg by mouth 2 (two) times daily as needed for anxiety.   5  . diltiazem (CARDIZEM) 120 MG tablet Take 120 mg by mouth 2 (two) times daily.     . famotidine (PEPCID) 20 MG tablet Take 20 mg by mouth daily.    . furosemide (LASIX) 20 MG tablet TAKE 1 TABLET BY MOUTH EVERY DAY 90 tablet 0  . levalbuterol (XOPENEX) 1.25 MG/3ML nebulizer solution Take 1.25 mg by nebulization every 8 (eight) hours as needed for wheezing or shortness of breath. 72 mL 12  . potassium chloride SA (K-DUR,KLOR-CON) 20 MEQ tablet TAKE 1 TABLET BY MOUTH TWICE DAILY 180 tablet 0  . pravastatin (PRAVACHOL) 40 MG tablet TAKE 1 TABLET BY MOUTH EVERY NIGHT AT BEDTIME FOR CHOLESTEROL 90 tablet 1  . sertraline (ZOLOFT) 100 MG tablet Take 0.5 tablets (50 mg total) by mouth daily. 90 tablet 1   No current facility-administered medications for this visit.     Allergies  Allergen Reactions  . Other      All pain medications: Unknown/ CODEINE  . Prednisone Other (See Comments)    DYSPHORIA  . Vioxx [Rofecoxib]     unknown  . Entex T [Pseudoephedrine-Guaifenesin] Palpitations  . Levaquin [Levofloxacin] Rash    Tolerated Avelox without adverse effect 11/08/15.  . Sulfa Antibiotics Rash  . Trovan [Alatrofloxacin] Rash     Past Medical History:  Diagnosis Date  . Anxiety and depression   . Arthritis   . Colon polyps   . GERD (gastroesophageal reflux disease)   . Hemorrhoids   . Hiatal hernia   . History of kidney stones    passed  . Hyperlipemia   . Mixed hyperlipidemia 11/26/2013  . Panic disorder   . Renal insufficiency    stage 3 kidney disease per her PCP  . Stomach ulcer   . Unspecified essential hypertension 11/26/2013    Past Surgical History:  Procedure Laterality Date  . ABDOMINAL HYSTERECTOMY    . CARPOMETACARPEL SUSPENSION PLASTY Right 12/08/2013   Procedure: CARPOMETACARPEL [facility]) SUSPENSION PLASTY;  Surgeon: Jolyn Nap, MD;  Location: [place];  Service: Orthopedics;  Laterality: Right;  . COLONOSCOPY    . FOOT SURGERY Left 2008   Bunion and pulled tendons  . HAND SURGERY Right    Dog Bite  . I&D EXTREMITY Right 01/20/2017  Procedure: RIGHT HAND IRRIGATION AND DEBRIDEMENT AND REPAIR AS INDICATED;  Surgeon: Iran Planas, MD;  Location: [place];  Service: Orthopedics;  Laterality: Right;  . OOPHORECTOMY Right   . TUBAL LIGATION    . UPPER GASTROINTESTINAL ENDOSCOPY      Social History   Socioeconomic History  . Marital status: Married    Spouse name: Not on file  . Number of children: 2  . Years of education: Not on file  . Highest education level: Not on file  Social Needs  . Financial resource strain: Not on file  . Food insecurity - worry: Not on file  . Food insecurity - inability: Not on file  . Transportation needs - medical: Not on file  . Transportation needs - non-medical: Not on file  Occupational History  .  Occupation: Housewife  Tobacco Use  . Smoking status: Passive Smoke Exposure - Never Smoker  . Smokeless tobacco: Never Used  . Tobacco comment: Husband & Father smoked  Substance and Sexual Activity  . Alcohol use: No    Alcohol/week: 0.0 oz  . Drug use: No  . Sexual activity: Yes    Partners: Male  Other Topics Concern  . Not on file  Social History Narrative   Originally from [place]. Has always lived in [place]. Previously has traveled to [place]. No international travel. Enjoys planting gardens and working in her flowers. 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 No bird, mold, or hot tub exposure.     Family History  Problem Relation Age of Onset  . Heart disease Brother        x2  . Stroke Brother   . Heart disease Father   . Asthma Mother   . Colon cancer Neg Hx   . Stomach cancer Neg Hx   . Breast cancer Neg Hx     ROS: no fevers or chills, productive cough, hemoptysis, dysphasia, odynophagia, melena, hematochezia, dysuria, hematuria, rash, seizure activity, orthopnea, PND, pedal edema, claudication. Remaining systems are negative.  Physical Exam:   Blood pressure 132/76, pulse 65, height 5\' 3"  (1.6 m), weight 140 lb 9.6 oz (63.8 kg).  General:  Well developed/well nourished in NAD Skin warm/dry Patient not depressed No peripheral clubbing Back-normal HEENT-normal/normal eyelids Neck supple/normal carotid upstroke bilaterally; no bruits; no JVD; no thyromegaly chest - CTA/ normal expansion CV - RRR/normal S1 and S2; no murmurs, rubs or gallops;  PMI nondisplaced Abdomen -NT/ND, no HSM, no mass, + bowel sounds, no bruit 2+ femoral pulses, no bruits Ext-no edema, chords, 2+ DP Neuro-grossly nonfocal  ECG -sinus rhythm at a rate of 65.  No ST changes.  Personally reviewed  A/P  1 chest pain-symptoms are atypical.  Plan exercise treadmill for risk stratification.  2 dyspnea-schedule echocardiogram to assess LV function.  Not volume overloaded on  examination.  3 hyperlipidemia-continue statin.  4 hypertension-blood pressure is controlled.  Continue present medications.  Kirk Ruths, MD

## 2017-10-03 ENCOUNTER — Encounter: Payer: Self-pay | Admitting: *Deleted

## 2017-10-10 ENCOUNTER — Telehealth: Payer: Self-pay | Admitting: *Deleted

## 2017-10-10 NOTE — Telephone Encounter (Signed)
Patient called and reported she has been having nose bleeds at night,  Per Dr Melford Aase, this may be caused by a dry nose and suggested she put vaseline on a q-tip and apply to the inside of her nose 3-4 times a day. Patient is aware.

## 2017-10-11 ENCOUNTER — Encounter: Payer: Self-pay | Admitting: Cardiology

## 2017-10-11 ENCOUNTER — Ambulatory Visit: Payer: PPO | Admitting: Cardiology

## 2017-10-11 VITALS — BP 132/76 | HR 65 | Ht 63.0 in | Wt 140.6 lb

## 2017-10-11 DIAGNOSIS — E7800 Pure hypercholesterolemia, unspecified: Secondary | ICD-10-CM | POA: Diagnosis not present

## 2017-10-11 DIAGNOSIS — I10 Essential (primary) hypertension: Secondary | ICD-10-CM

## 2017-10-11 DIAGNOSIS — R072 Precordial pain: Secondary | ICD-10-CM | POA: Diagnosis not present

## 2017-10-11 DIAGNOSIS — R0600 Dyspnea, unspecified: Secondary | ICD-10-CM

## 2017-10-11 NOTE — Patient Instructions (Signed)
Medication Instructions:   NO CHANGE  Testing/Procedures:  Your physician has requested that you have an exercise tolerance test. For further information please visit [email or web address]. Please also follow instruction sheet, as given.   Your physician has requested that you have an echocardiogram. Echocardiography is a painless test that uses sound waves to create images of your heart. It provides your doctor with information about the size and shape of your heart and how well your heart's chambers and valves are working. This procedure takes approximately one hour. There are no restrictions for this procedure.    Follow-Up:  Your physician recommends that you schedule a follow-up appointment in: AS NEEDED PENDING TEST RESULTS    Exercise Stress Electrocardiogram An exercise stress electrocardiogram is a test to check how blood flows to your heart. It is done to find areas of poor blood flow. You will need to walk on a treadmill for this test. The electrocardiogram will record your heartbeat when you are at rest and when you are exercising. What happens before the procedure?  Do not have drinks with caffeine or foods with caffeine for 24 hours before the test, or as told by your doctor. This includes coffee, tea (even decaf tea), sodas, chocolate, and cocoa.  Follow your doctor's instructions about eating and drinking before the test.  Ask your doctor what medicines you should or should not take before the test. Take your medicines with water unless told by your doctor not to.  If you use an inhaler, bring it with you to the test.  Bring a snack to eat after the test.  Do not  smoke for 4 hours before the test.  Do not put lotions, powders, creams, or oils on your chest before the test.  Wear comfortable shoes and clothing. What happens during the procedure?  You will have patches put on your chest. Small areas of your chest may need to be shaved. Wires will be connected to the  patches.  Your heart rate will be watched while you are resting and while you are exercising.  You will walk on the treadmill. The treadmill will slowly get faster to raise your heart rate.  The test will take about 1-2 hours. What happens after the procedure?  Your heart rate and blood pressure will be watched after the test.  You may return to your normal diet, activities, and medicines or as told by your doctor. This information is not intended to replace advice given to you by your health care provider. Make sure you discuss any questions you have with your health care provider. Document Released: 02/07/2008 Document Revised: 04/19/2016 Document Reviewed: 04/28/2013 Elsevier Interactive Patient Education  [employer].

## 2017-10-17 ENCOUNTER — Other Ambulatory Visit: Payer: Self-pay

## 2017-10-17 ENCOUNTER — Ambulatory Visit (HOSPITAL_COMMUNITY): Payer: PPO | Attending: Cardiovascular Disease

## 2017-10-17 DIAGNOSIS — I10 Essential (primary) hypertension: Secondary | ICD-10-CM | POA: Insufficient documentation

## 2017-10-17 DIAGNOSIS — R072 Precordial pain: Secondary | ICD-10-CM | POA: Diagnosis not present

## 2017-10-17 DIAGNOSIS — E785 Hyperlipidemia, unspecified: Secondary | ICD-10-CM | POA: Insufficient documentation

## 2017-10-17 DIAGNOSIS — I081 Rheumatic disorders of both mitral and tricuspid valves: Secondary | ICD-10-CM | POA: Insufficient documentation

## 2017-10-17 LAB — ECHOCARDIOGRAM COMPLETE
E decel time: 250 msec
E/e' ratio: 10.73
FS: 27 % — AB (ref 28–44)
IVS/LV PW RATIO, ED: 0.68
LA ID, A-P, ES: 42 mm
LA diam end sys: 42 mm
LA diam index: 2.48 cm/m2
LA vol A4C: 59.6 ml
LA vol index: 31.5 mL/m2
LA vol: 53.4 mL
LV E/e' medial: 10.73
LV E/e'average: 10.73
LV PW d: 13.4 mm — AB (ref 0.6–1.1)
LV e' LATERAL: 6.2 cm/s
LVOT SV: 67 mL
LVOT VTI: 21.4 cm
LVOT area: 3.14 cm2
LVOT diameter: 20 mm
LVOT peak vel: 95.3 cm/s
Lateral S' vel: 13.7 cm/s
MV Dec: 250
MV pk A vel: 88.8 m/s
MV pk E vel: 66.5 m/s
Peak grad: 235 mmHg
RV sys press: 25 mmHg
Reg peak vel: 235 cm/s
TAPSE: 22.7 mm
TDI e' lateral: 6.2
TDI e' medial: 5
TR max vel: 235 cm/s

## 2017-10-18 ENCOUNTER — Encounter: Payer: Self-pay | Admitting: Internal Medicine

## 2017-10-19 ENCOUNTER — Inpatient Hospital Stay (HOSPITAL_COMMUNITY)
Admission: RE | Admit: 2017-10-19 | Payer: PPO | Source: Ambulatory Visit | Attending: Cardiology | Admitting: Cardiology

## 2017-10-23 ENCOUNTER — Other Ambulatory Visit: Payer: Self-pay | Admitting: Adult Health

## 2017-10-23 MED ORDER — DILTIAZEM HCL 120 MG PO TABS: 120.0000 mg | ORAL_TABLET | Freq: Two times a day (BID) | ORAL | 1 refills | Status: DC

## 2017-10-24 ENCOUNTER — Other Ambulatory Visit: Payer: Self-pay | Admitting: *Deleted

## 2017-10-24 MED ORDER — SERTRALINE HCL 100 MG PO TABS: 50.0000 mg | ORAL_TABLET | Freq: Every day | ORAL | 1 refills | Status: DC

## 2017-10-31 ENCOUNTER — Encounter: Payer: Self-pay | Admitting: Adult Health

## 2017-10-31 ENCOUNTER — Ambulatory Visit (INDEPENDENT_AMBULATORY_CARE_PROVIDER_SITE_OTHER): Payer: PPO | Admitting: Adult Health

## 2017-10-31 VITALS — BP 130/80 | HR 75 | Temp 97.7°F | Ht 63.0 in | Wt 143.0 lb

## 2017-10-31 DIAGNOSIS — I10 Essential (primary) hypertension: Secondary | ICD-10-CM

## 2017-10-31 NOTE — Progress Notes (Signed)
Assessment and Plan:  Latoya [name] was seen today for joint swelling.  Diagnoses and all orders for this visit:  Essential hypertension Reassured, education provided, patient is accepting Continue medications - lasix as needed only, wear compression hose more consistently  Monitor blood pressure at home; call if consistently over 130/80 Continue DASH diet.   Reminder to go to the ER if any CP, SOB, nausea, dizziness, severe HA, changes vision/speech, left arm numbness and tingling and jaw pain.  - elevate legs TID, increase activity, increase water, decrease sodium intake.  Wear compression socks more routinely. Return to the office if has edema not resolving with compression and lasix accompanied by dyspnea. Defer labs as has been checked recently.    Further disposition pending results of labs. Discussed med's effects and SE's.   Over 15 minutes of exam, counseling, chart review, and critical decision making was performed.   Future Appointments  Date Time Provider [place]  11/01/2017 10:00 AM Gatha Mayer, MD [facility]-LE[facility] LBP[facility]ndo  11/27/2017  9:45 AM Unk Pinto, MD GAAM-GAAIM None  11/30/2017  7:50 AM GI-BCG DIAG [place]OM[place] 3 GI-BCGMM GI-BREAST CE  11/30/2017  8:00 AM GI-BCG [place] 3 GI-BCGUS GI-BREAST CE    ------------------------------------------------------------------------------------------------------------------   HPI BP 130/80   Pulse 75   Temp 97.7 F (36.5 C)   Ht 5\' 3"  (1.6 m)   Wt 143 lb (64.9 kg)   SpO2 99%   BMI 25.33 kg/m   67 y.o.female with hx of anxiety and htn presents for concerns about swelling in her ankles. She is prescribed lasix for edema and hypertension, she takes 1/2 a tab of prescribed 20 mg lasix as needed for swelling. She reports bilateral ankles have been "swollen" over the last 2 days, and she has been taking lasix as prescribed. She presents today without notable edema, denies dyspnea on exertion, chest pain, palpitations, paroxysmal  nocturnal dyspnea. She has been instructed to wear compression hose repeatedly in the past but admits she has not been wearing them regularly.   No hx of CHF with recent ECHO 10/17/2017 demonstrating LV EF 55-60%, mild mitral valve regurgitation.   Past Medical History:  Diagnosis Date  . Anxiety and depression   . Arthritis   . Colon polyps   . GERD (gastroesophageal reflux disease)   . Hemorrhoids   . Hiatal hernia   . History of kidney stones    passed  . Hyperlipemia   . Mixed hyperlipidemia 11/26/2013  . Panic disorder   . Renal insufficiency    stage 3 kidney disease per her PCP  . Stomach ulcer   . Unspecified essential hypertension 11/26/2013     Allergies  Allergen Reactions  . Other     All pain medications: Unknown/ CODEINE  . Prednisone Other (See Comments)    DYSPHORIA  . Vioxx [Rofecoxib]     unknown  . Entex T [Pseudoephedrine-Guaifenesin] Palpitations  . Levaquin [Levofloxacin] Rash    Tolerated Avelox without adverse effect 11/08/15.  . Sulfa Antibiotics Rash  . Trovan [Alatrofloxacin] Rash    Current Outpatient Medications on File Prior to Visit  Medication Sig  . aspirin 81 MG tablet Take 81 mg by mouth at bedtime.   . Biotin 10000 MCG TABS Take by mouth daily.  . cholecalciferol (VITAMIN D) 1000 units tablet Take 1,000 Units by mouth daily.  . clonazePAM (KLONOPIN) 1 MG tablet Take 1 mg by mouth 2 (two) times daily as needed for anxiety.   Marland Kitchen diltiazem (CARDIZEM) 120 MG tablet  Take 1 tablet (120 mg total) by mouth 2 (two) times daily.  . famotidine (PEPCID) 20 MG tablet Take 20 mg by mouth daily.  . furosemide (LASIX) 20 MG tablet TAKE 1 TABLET BY MOUTH EVERY DAY (Patient taking differently: prn)  . levalbuterol (XOPENEX) 1.25 MG/3ML nebulizer solution Take 1.25 mg by nebulization every 8 (eight) hours as needed for wheezing or shortness of breath.  . potassium chloride SA (K-DUR,KLOR-CON) 20 MEQ tablet TAKE 1 TABLET BY MOUTH TWICE DAILY (Patient taking  differently: PRN)  . pravastatin (PRAVACHOL) 40 MG tablet TAKE 1 TABLET BY MOUTH EVERY NIGHT AT BEDTIME FOR CHOLESTEROL  . sertraline (ZOLOFT) 100 MG tablet Take 0.5 tablets (50 mg total) by mouth daily.   No current facility-administered medications on file prior to visit.     ROS: Review of Systems  Constitutional: Negative for chills, diaphoresis, fever and malaise/fatigue.  HENT: Negative.   Eyes: Negative for blurred vision.  Respiratory: Negative for cough, hemoptysis, sputum production, shortness of breath and wheezing.   Cardiovascular: Positive for leg swelling (Last 2 days). Negative for chest pain, palpitations, orthopnea, claudication and PND.  Gastrointestinal: Negative for abdominal pain, diarrhea, nausea and vomiting.  Genitourinary: Negative.   Neurological: Negative for dizziness, weakness and headaches.  Psychiatric/Behavioral: Negative.   All other systems reviewed and are negative.    Physical Exam:  BP 130/80   Pulse 75   Temp 97.7 F (36.5 C)   Ht 5\' 3"  (1.6 m)   Wt 143 lb (64.9 kg)   SpO2 99%   BMI 25.33 kg/m   General Appearance: Well nourished, in no apparent distress. Neck: Supple.  Respiratory: Respiratory effort normal, BS equal bilaterally without rales, rhonchi, wheezing or stridor.  Cardio: RRR with no MRGs. Brisk peripheral pulses without edema.  Abdomen: Soft, + BS. Non-distended. Musculoskeletal: normal gait.  Skin: Warm, dry without rashes, lesions, ecchymosis.  Psych: Awake and oriented X 3, normal affect, Insight and Judgment appropriate.    Izora Ribas, NP 12:47 PM [facility]

## 2017-10-31 NOTE — Patient Instructions (Signed)
Cut down on salt, drink plenty of fluids for your kidneys, wear compression hose during the day when you are on your feed.   Let me know if you have swelling even with compression, lasix and putting your feet up AND you have shortness of breath (esp when lying down at night).     HOME CARE INSTRUCTIONS   Do not stand or sit in one position for long periods of time. Do not sit with your legs crossed. Rest with your legs raised during the day.  Your legs have to be higher than your heart so that gravity will force the valves to open, so please really elevate your legs.   Wear elastic stockings or support hose. Do not wear other tight, encircling garments around the legs, pelvis, or waist.  [employer]  has a wide variety of well priced compression stockings. [place] [place] [address] [id number] has a good cheap selection, I like the socks, they are not as hard to get on  Walk as much as possible to increase blood flow.  Raise the foot of your bed at night with 2-inch blocks. SEEK MEDICAL CARE IF:   The skin around your ankle starts to break down.  You have pain, redness, tenderness, or hard swelling developing in your leg over a vein.  You are uncomfortable due to leg pain. Document Released: 05/31/2005 Document Revised: 11/13/2011 Document Reviewed: 10/17/2010 [facility] Patient Information 2014 [place].

## 2017-11-01 ENCOUNTER — Encounter: Payer: Self-pay | Admitting: Internal Medicine

## 2017-11-01 ENCOUNTER — Other Ambulatory Visit: Payer: Self-pay

## 2017-11-01 ENCOUNTER — Ambulatory Visit (AMBULATORY_SURGERY_CENTER): Payer: PPO | Admitting: Internal Medicine

## 2017-11-01 VITALS — BP 145/76 | HR 51 | Temp 98.4°F | Resp 18

## 2017-11-01 DIAGNOSIS — K2100 Gastro-esophageal reflux disease with esophagitis, without bleeding: Secondary | ICD-10-CM

## 2017-11-01 DIAGNOSIS — K259 Gastric ulcer, unspecified as acute or chronic, without hemorrhage or perforation: Secondary | ICD-10-CM | POA: Diagnosis not present

## 2017-11-01 DIAGNOSIS — R1310 Dysphagia, unspecified: Secondary | ICD-10-CM | POA: Diagnosis not present

## 2017-11-01 MED ORDER — FAMOTIDINE 20 MG PO TABS: 40.0000 mg | ORAL_TABLET | Freq: Two times a day (BID) | ORAL | Status: DC

## 2017-11-01 MED ORDER — SODIUM CHLORIDE 0.9 % IV SOLN: 500.0000 mL | Freq: Once | INTRAVENOUS | Status: DC

## 2017-11-01 NOTE — Op Note (Signed)
[place] Patient Name: Latoya [name] Procedure Date: 11/01/2017 10:18 AM MRN: [id number] Endoscopist: Gatha Mayer , MD Age: 68 Referring MD:  Date of Birth: 06-Apr-1951 Gender: Female Account #: [id number] Procedure:                Upper GI endoscopy Indications:              Dysphagia Medicines:                Propofol per Anesthesia, Monitored Anesthesia Care Procedure:                Pre-Anesthesia Assessment:                           - Prior to the procedure, a History and Physical                            was performed, and patient medications and                            allergies were reviewed. The patient's tolerance of                            previous anesthesia was also reviewed. The risks                            and benefits of the procedure and the sedation                            options and risks were discussed with the patient.                            All questions were answered, and informed consent                            was obtained. Prior Anticoagulants: The patient has                            taken no previous anticoagulant or antiplatelet                            agents. ASA Grade Assessment: II - A patient with                            mild systemic disease. After reviewing the risks                            and benefits, the patient was deemed in                            satisfactory condition to undergo the procedure.                           After obtaining informed consent, the endoscope was  passed under direct vision. Throughout the                            procedure, the patient's blood pressure, pulse, and                            oxygen saturations were monitored continuously. The                            Endoscope was introduced through the mouth, and                            advanced to the second part of duodenum. The upper                            GI endoscopy was  accomplished without difficulty.                            The patient tolerated the procedure well. Scope In: Scope Out: Findings:                 The examined esophagus was moderately tortuous. The                            scope was withdrawn. Dilation was performed with a                            Maloney dilator with mild resistance at 68 Fr. The                            dilation site was examined following endoscope                            reinsertion and showed no change. Estimated blood                            loss: none.                           LA Grade A (one or more mucosal breaks less than 5                            mm, not extending between tops of 2 mucosal folds)                            esophagitis with no bleeding was found at the                            gastroesophageal junction.                           One localized, small non-bleeding erosion was found  in the gastric body. There were no stigmata of                            recent bleeding.                           A 1-2 cm hiatal hernia was present.                           The exam was otherwise without abnormality.                           The cardia and gastric fundus were otherwise normal                            on retroflexion. Complications:            No immediate complications. Estimated Blood Loss:     Estimated blood loss: none. Impression:               - Tortuous esophagus.                           - LA Grade A reflux esophagitis.                           - Non-bleeding erosive gastropathy.                           - 1-2 cm hiatal hernia.                           - The examination was otherwise normal.                           - No specimens collected. Recommendation:           - Patient has a contact number available for                            emergencies. The signs and symptoms of potential                            delayed  complications were discussed with the                            patient. Return to normal activities tomorrow.                            Written discharge instructions were provided to the                            patient.                           - Clear liquids x 1 hour then soft foods rest of  day. Start prior diet tomorrow.                           - Change Pepcid to 40 mg bid (she does not want to                            use PPI)                           Follow an antireflux regimen indefinitely. This                            includes:                           - Do not lie down for at least 3 to 4 hours after                            meals.                           - Raise the head of the bed 4 to 6 inches.                           - Decrease excess weight.                           - Avoid citrus juices and other acidic foods,                            alcohol, chocolate, mints, coffee and other                            caffeinated beverages, carbonated beverages, fatty                            and fried foods.                           - Avoid tight-fitting clothing.                           - Avoid cigarettes and other tobacco products.                           if not better after 2 months let me know                           - Continue present medications. Gatha Mayer, MD 11/01/2017 10:40:54 AM This report has been signed electronically.

## 2017-11-01 NOTE — Progress Notes (Signed)
To PACU, VSS. Report to [email or web address] 

## 2017-11-01 NOTE — Progress Notes (Signed)
Called to room to assist during endoscopic procedure.  Patient ID and intended procedure confirmed with present staff. Received instructions for my participation in the procedure from the performing physician.  

## 2017-11-01 NOTE — Patient Instructions (Addendum)
I found a small erosion/ulcer in the stomach, and some mild inflammation where esophagus and stomach meet "esophagitis".  I dilated the esophagus also.  Hope that helps.  I recommend you change Pepcid to 40 mg twice a day. (two 20 mg tablets x 2)  Follow an antireflux regimen indefinitely.  This includes:      - Do not lie down for at least 3 to 4 hours after meals.       - Raise the head of the bed 4 to 6 inches.       - Decrease excess weight.       - Avoid citrus juices and other acidic foods, alcohol, chocolate, mints, coffee and other caffeinated beverages, carbonated beverages, fatty and fried foods.       - Avoid tight-fitting clothing.       - Avoid cigarettes and other tobacco products.    If you do not feel significantly better after 2 months let me know.  I appreciate the opportunity to care for you. Gatha Mayer, MD, [facility]  HANDOUTS GIVEN FOR POST DILATION DIET, GERD/REFLUX, ESOPHAGITIS/STRICTURE  NO ANSWER, MESSAGE LEFT FOR PATIENT.YOU HAD AN ENDOSCOPIC PROCEDURE TODAY AT [place]:   Refer to the procedure report that was given to you for any specific questions about what was found during the examination.  If the procedure report does not answer your questions, please call your gastroenterologist to clarify.  If you requested that your care partner not be given the details of your procedure findings, then the procedure report has been included in a sealed envelope for you to review at your convenience later.  YOU SHOULD EXPECT: Some feelings of bloating in the abdomen. Passage of more gas than usual.  Walking can help get rid of the air that was put into your GI tract during the procedure and reduce the bloating. If you had a lower endoscopy (such as a colonoscopy or flexible sigmoidoscopy) you may notice spotting of blood in your stool or on the toilet paper. If you underwent a bowel prep for your procedure, you may not have a normal bowel  movement for a few days.  Please Note:  You might notice some irritation and congestion in your nose or some drainage.  This is from the oxygen used during your procedure.  There is no need for concern and it should clear up in a day or so.  SYMPTOMS TO REPORT IMMEDIATELY:    Following upper endoscopy (EGD)  Vomiting of blood or coffee ground material  New chest pain or pain under the shoulder blades  Painful or persistently difficult swallowing  New shortness of breath  Fever of 100F or higher  Black, tarry-looking stools  For urgent or emergent issues, a gastroenterologist can be reached at any hour by calling [phone].   DIET:  We do recommend a small meal at first, but then you may proceed to your regular diet.  Drink plenty of fluids but you should avoid alcoholic beverages for 24 hours.  ACTIVITY:  You should plan to take it easy for the rest of today and you should NOT DRIVE or use heavy machinery until tomorrow (because of the sedation medicines used during the test).    FOLLOW UP: Our staff will call the number listed on your records the next business day following your procedure to check on you and address any questions or concerns that you may have regarding the information given to you following your  procedure. If we do not reach you, we will leave a message.  However, if you are feeling well and you are not experiencing any problems, there is no need to return our call.  We will assume that you have returned to your regular daily activities without incident.  If any biopsies were taken you will be contacted by phone or by letter within the next 1-3 weeks.  Please call us at [phone] if you have not heard about the biopsies in 3 weeks.    SIGNATURES/CONFIDENTIALITY: You and/or your care partner have signed paperwork which will be entered into your electronic medical record.  These signatures attest to the fact that that the information above on your After Visit  Summary has been reviewed and is understood.  Full responsibility of the confidentiality of this discharge information lies with you and/or your care-partner.

## 2017-11-02 ENCOUNTER — Telehealth: Payer: Self-pay | Admitting: *Deleted

## 2017-11-02 NOTE — Telephone Encounter (Signed)
  Follow up Call-  Call back number 11/01/2017  Post procedure Call Back phone  # [phone]  Permission to leave phone message Yes  Some recent data might be hidden     Patient questions:  Do you have a fever, pain , or abdominal swelling? No. Pain Score  0 *  Have you tolerated food without any problems? Yes.    Have you been able to return to your normal activities? Yes.    Do you have any questions about your discharge instructions: Diet   No. Medications  No. Follow up visit  No.  Do you have questions or concerns about your Care? No.  Actions: * If pain score is 4 or above: No action needed, pain <4.

## 2017-11-12 ENCOUNTER — Ambulatory Visit (HOSPITAL_COMMUNITY)
Admission: RE | Admit: 2017-11-12 | Discharge: 2017-11-12 | Disposition: A | Discharge status: Home / Self Care | Payer: PPO | Source: Ambulatory Visit | Attending: Adult Health | Admitting: Adult Health

## 2017-11-12 ENCOUNTER — Ambulatory Visit (INDEPENDENT_AMBULATORY_CARE_PROVIDER_SITE_OTHER): Payer: PPO | Admitting: Adult Health

## 2017-11-12 ENCOUNTER — Other Ambulatory Visit: Payer: Self-pay | Admitting: Adult Health

## 2017-11-12 ENCOUNTER — Ambulatory Visit: Payer: Self-pay | Admitting: Adult Health

## 2017-11-12 ENCOUNTER — Encounter: Payer: Self-pay | Admitting: Adult Health

## 2017-11-12 VITALS — BP 122/74 | HR 70 | Temp 97.5°F | Ht 63.0 in | Wt 141.0 lb

## 2017-11-12 DIAGNOSIS — Z90721 Acquired absence of ovaries, unilateral: Secondary | ICD-10-CM | POA: Diagnosis not present

## 2017-11-12 DIAGNOSIS — R10813 Right lower quadrant abdominal tenderness: Secondary | ICD-10-CM

## 2017-11-12 DIAGNOSIS — Z90710 Acquired absence of both cervix and uterus: Secondary | ICD-10-CM | POA: Diagnosis not present

## 2017-11-12 MED ORDER — DICYCLOMINE HCL 10 MG PO CAPS: ORAL_CAPSULE | ORAL | 0 refills | Status: DC

## 2017-11-12 NOTE — Patient Instructions (Signed)

## 2017-11-12 NOTE — Progress Notes (Signed)
Assessment and Plan:  Latoya [name] was seen today for abdominal pain.  Diagnoses and all orders for this visit:  Right lower quadrant abdominal tenderness without rebound tenderness Stat labs ordered; no appendix/uterus/ovary - will get ultrasound today - diverticulitis vs adhesions vs distal ureteral calculi? -     CBC with Differential/Platelet -     BASIC METABOLIC PANEL WITH GFR -     Urinalysis w microscopic + reflex cultur -     US Abdomen Complete; Future  Please go to the ER if you have any severe AB pain, unable to hold down food/water, blood in stool or vomit, chest pain, shortness of breath, or any worsening symptoms.   Further disposition pending results of labs. Discussed med's effects and SE's.   Over  minutes of exam, counseling, chart review, and critical decision making was performed.   Future Appointments  Date Time Provider [place]  11/27/2017  9:45 AM Unk Pinto, MD [facility] None  11/30/2017  7:50 AM [facility] DI[facility] [place] 3 [facility]  11/30/2017  8:00 AM [facility] [place] 3 [facility]    ------------------------------------------------------------------------------------------------------------------   HPI BP 122/74   Pulse 70   Temp (!) 97.5 F (36.4 C)   Ht 5\' 3"  (1.6 m)   Wt 141 lb (64 kg)   SpO2 99%   BMI 24.98 kg/m   67 y.o.female with hx of GAD, GERD, CKD, hiatal hernia presents for RLQ abdominal pain which began suddenly yesterday - "could hardly walk" - sharp - intermittent, worse with walking/bending - 10/10 - non-radiating, pain resolves at rest. Denies nausea/vomiting, constipation, diarrhea, changes in urine. Denies fever/chills, weakness, dizziness. Denies vaginal discharge or rash. Denies gassiness or sense of bloating.   She does endorse remote hx of kidney stones. She has had an appendectomy and hysterectomy with oophorectomy on right -    Past Medical History:  Diagnosis Date  . Anxiety and depression   . Arthritis    . Colon polyps   . GERD (gastroesophageal reflux disease)   . Hemorrhoids   . Hiatal hernia   . History of kidney stones    passed  . Hyperlipemia   . Mixed hyperlipidemia 11/26/2013  . Panic disorder   . Renal insufficiency    stage 3 kidney disease per her PCP  . Stomach ulcer   . Unspecified essential hypertension 11/26/2013     Allergies  Allergen Reactions  . Other     All pain medications: Unknown/ CODEINE  . Prednisone Other (See Comments)    DYSPHORIA  . Vioxx [Rofecoxib]     unknown  . Entex T [Pseudoephedrine-Guaifenesin] Palpitations  . Levaquin [Levofloxacin] Rash    Tolerated Avelox without adverse effect 11/08/15.  . Sulfa Antibiotics Rash  . Trovan [Alatrofloxacin] Rash    Current Outpatient Medications on File Prior to Visit  Medication Sig  . aspirin 81 MG tablet Take 81 mg by mouth at bedtime.   . Biotin 10000 MCG TABS Take by mouth daily.  . cholecalciferol (VITAMIN D) 1000 units tablet Take 1,000 Units by mouth daily.  . clonazePAM (KLONOPIN) 1 MG tablet Take 1 mg by mouth 2 (two) times daily as needed for anxiety.   Marland Kitchen diltiazem (CARDIZEM) 120 MG tablet Take 1 tablet (120 mg total) by mouth 2 (two) times daily.  . famotidine (PEPCID) 20 MG tablet Take 2 tablets (40 mg total) by mouth 2 (two) times daily.  . furosemide (LASIX) 20 MG tablet TAKE 1 TABLET BY MOUTH EVERY DAY (  Patient taking differently: prn)  . levalbuterol (XOPENEX) 1.25 MG/3ML nebulizer solution Take 1.25 mg by nebulization every 8 (eight) hours as needed for wheezing or shortness of breath.  . potassium chloride SA (K-DUR,KLOR-CON) 20 MEQ tablet TAKE 1 TABLET BY MOUTH TWICE DAILY (Patient taking differently: PRN)  . pravastatin (PRAVACHOL) 40 MG tablet TAKE 1 TABLET BY MOUTH EVERY NIGHT AT BEDTIME FOR CHOLESTEROL  . sertraline (ZOLOFT) 100 MG tablet Take 0.5 tablets (50 mg total) by mouth daily.   No current facility-administered medications on file prior to visit.     ROS: all negative  except above.   Physical Exam:  BP 122/74   Pulse 70   Temp (!) 97.5 F (36.4 C)   Ht 5\' 3"  (1.6 m)   Wt 141 lb (64 kg)   SpO2 99%   BMI 24.98 kg/m   General Appearance: Well nourished, in no acute distress. Eyes: PERRLA, conjunctiva no swelling or erythema ENT/Mouth: No erythema, swelling, or exudate on post pharynx.  Tonsils not swollen or erythematous. Hearing normal.  Neck: Supple.  Respiratory: Respiratory effort normal, BS equal bilaterally without rales, rhonchi, wheezing or stridor.  Cardio: RRR with no MRGs. Brisk peripheral pulses without edema.  Abdomen: Soft, + BS.  RLQ tenderness to deep palpation, no guarding, ?rebound, no palpable hernias or masses. Lymphatics: Non tender without lymphadenopathy.  Musculoskeletal: Full ROM of bilateral hips, Symmetrical strength, normal gait.  Skin: Warm, dry without rashes, lesions, ecchymosis.  Psych: Awake and oriented X 3, normal affect, Insight and Judgment appropriate.     Izora Ribas, NP 9:29 AM [facility]

## 2017-11-12 NOTE — Addendum Note (Signed)
Addended by: Izora Ribas on: 11/12/2017 10:31 AM   Modules accepted: Orders

## 2017-11-13 LAB — URINALYSIS W MICROSCOPIC + REFLEX CULTURE
Bilirubin Urine: NEGATIVE
Glucose, UA: NEGATIVE
Hgb urine dipstick: NEGATIVE
Ketones, ur: NEGATIVE
Nitrites, Initial: NEGATIVE
RBC / HPF: NONE SEEN /HPF (ref 0–2)
Specific Gravity, Urine: 1.018 (ref 1.001–1.03)
pH: 6 (ref 5.0–8.0)

## 2017-11-13 LAB — CBC WITH DIFFERENTIAL/PLATELET
Basophils Absolute: 48 cells/uL (ref 0–200)
Basophils Relative: 0.8 %
Eosinophils Absolute: 168 cells/uL (ref 15–500)
Eosinophils Relative: 2.8 %
HCT: 42.1 % (ref 35.0–45.0)
Hemoglobin: 14.5 g/dL (ref 11.7–15.5)
Lymphs Abs: 1380 cells/uL (ref 850–3900)
MCH: 30.7 pg (ref 27.0–33.0)
MCHC: 34.4 g/dL (ref 32.0–36.0)
MCV: 89.2 fL (ref 80.0–100.0)
MPV: 9.9 fL (ref 7.5–12.5)
Monocytes Relative: 10.2 %
Neutro Abs: 3792 cells/uL (ref 1500–7800)
Neutrophils Relative %: 63.2 %
Platelets: 201 10*3/uL (ref 140–400)
RBC: 4.72 10*6/uL (ref 3.80–5.10)
RDW: 12.9 % (ref 11.0–15.0)
Total Lymphocyte: 23 %
WBC mixed population: 612 cells/uL (ref 200–950)
WBC: 6 10*3/uL (ref 3.8–10.8)

## 2017-11-13 LAB — URINE CULTURE
MICRO NUMBER:: 90306783
SPECIMEN QUALITY:: ADEQUATE

## 2017-11-13 LAB — CULTURE INDICATED

## 2017-11-13 LAB — BASIC METABOLIC PANEL WITH GFR
BUN: 10 mg/dL (ref 7–25)
CO2: 27 mmol/L (ref 20–32)
Calcium: 9.1 mg/dL (ref 8.6–10.4)
Chloride: 108 mmol/L (ref 98–110)
Creat: 0.97 mg/dL (ref 0.50–0.99)
GFR, Est African American: 70 mL/min/{1.73_m2} (ref >=60)
GFR, Est Non African American: 60 mL/min/{1.73_m2} (ref >=60)
Glucose, Bld: 63 mg/dL — ABNORMAL LOW (ref 65–99)
Potassium: 3.7 mmol/L (ref 3.5–5.3)
Sodium: 142 mmol/L (ref 135–146)

## 2017-11-16 ENCOUNTER — Telehealth: Payer: Self-pay

## 2017-11-16 ENCOUNTER — Other Ambulatory Visit: Payer: Self-pay | Admitting: Adult Health

## 2017-11-16 DIAGNOSIS — R300 Dysuria: Secondary | ICD-10-CM

## 2017-11-16 MED ORDER — NITROFURANTOIN MONOHYD MACRO 100 MG PO CAPS: 100.0000 mg | ORAL_CAPSULE | Freq: Two times a day (BID) | ORAL | 0 refills | Status: AC

## 2017-11-16 NOTE — Telephone Encounter (Signed)
Patient said she's still hurting, tried meds that were prescribed but feels that they are not working. Also, she has burning when she urinates. Please advise

## 2017-11-19 NOTE — Telephone Encounter (Signed)
Spoke with patient. States that she has lower abdominal left side pain. Has one more day of antibiotics and will call us if pain continues.

## 2017-11-19 NOTE — Telephone Encounter (Signed)
Tried to reach patient and no answer without VM available to leave a message

## 2017-11-26 NOTE — Patient Instructions (Signed)

## 2017-11-26 NOTE — Progress Notes (Signed)
This very nice 67 y.o. MWF  presents for 3 month follow up with HTN, HLD, Pre-Diabetes and Vitamin D Deficiency. Patient has hx/o Chronic Anxiety/Depression w/ suspect Bipolar Disorder by Dr Casimiro Needle in the past.      Patient is treated for HTN (1985)  & BP has been controlled at home. Today's BP is at goal - 132/84.  She does have CKD3 attributed to her HTN. Patient has had no complaints of any cardiac type chest pain, palpitations, dyspnea / orthopnea / PND, dizziness, claudication, or dependent edema. She also has GERD controlled w/meds.      Hyperlipidemia is controlled with diet & Pravastatin. Patient denies myalgias or other med SE's. Last Lipids were at goal: Lab Results  Component Value Date   CHOL 163 07/30/2017   HDL 49 (L) 07/30/2017   LDLCALC 87 07/30/2017   TRIG 175 (H) 07/30/2017   CHOLHDL 3.3 07/30/2017      Also, the patient has history of PreDiabetes (A1c 5.8% in 2014 and 5.7% in 2015 w/ an elevated Insulin 40) and has had no symptoms of reactive hypoglycemia, diabetic polys, paresthesias or visual blurring.  Last A1c was Normal & at goal: Lab Results  Component Value Date   HGBA1C 5.6 06/30/2016      Further, the patient also has history of Vitamin D Deficiency and supplements vitamin D without any suspected side-effects. Last vitamin D was   Lab Results  Component Value Date   VD25OH 61 06/30/2016   Current Outpatient Medications on File Prior to Visit  Medication Sig  . aspirin 81 MG tablet Take 81 mg by mouth at bedtime.   . Biotin 10000 MCG TABS Take by mouth daily.  . cholecalciferol (VITAMIN D) 1000 units tablet Take 1,000 Units by mouth daily.  . clonazePAM (KLONOPIN) 1 MG tablet Take 1 mg by mouth 2 (two) times daily as needed for anxiety.   . dicyclomine (BENTYL) 10 MG capsule Take every 6 hours as needed for cramping abdominal pain.  Marland Kitchen diltiazem (CARDIZEM) 120 MG tablet Take 1 tablet (120 mg total) by mouth 2 (two) times daily.  . famotidine (PEPCID)  20 MG tablet Take 2 tablets (40 mg total) by mouth 2 (two) times daily.  . furosemide (LASIX) 20 MG tablet TAKE 1 TABLET BY MOUTH EVERY DAY (Patient taking differently: prn)  . levalbuterol (XOPENEX) 1.25 MG/3ML nebulizer solution Take 1.25 mg by nebulization every 8 (eight) hours as needed for wheezing or shortness of breath.  . potassium chloride SA (K-DUR,KLOR-CON) 20 MEQ tablet TAKE 1 TABLET BY MOUTH TWICE DAILY (Patient taking differently: PRN)  . pravastatin (PRAVACHOL) 40 MG tablet TAKE 1 TABLET BY MOUTH EVERY NIGHT AT BEDTIME FOR CHOLESTEROL  . sertraline (ZOLOFT) 100 MG tablet Take 0.5 tablets (50 mg total) by mouth daily.   No current facility-administered medications on file prior to visit.    Allergies  Allergen Reactions  . Other     All pain medications: Unknown/ CODEINE  . Prednisone Other (See Comments)    DYSPHORIA  . Vioxx [Rofecoxib]     unknown  . Entex T [Pseudoephedrine-Guaifenesin] Palpitations  . Levaquin [Levofloxacin] Rash    Tolerated Avelox without adverse effect 11/08/15.  . Sulfa Antibiotics Rash  . Trovan [Alatrofloxacin] Rash   PMHx:   Past Medical History:  Diagnosis Date  . Anxiety and depression   . Arthritis   . Colon polyps   . GERD (gastroesophageal reflux disease)   . Hemorrhoids   .  Hiatal hernia   . History of kidney stones    passed  . Hyperlipemia   . Mixed hyperlipidemia 11/26/2013  . Panic disorder   . Renal insufficiency    stage 3 kidney disease per her PCP  . Stomach ulcer   . Unspecified essential hypertension 11/26/2013   Immunization History  Administered Date(s) Administered  . Influenza Split 06/04/2014, 06/05/2015  . Influenza, High Dose Seasonal PF 05/30/2017  . Influenza-Unspecified 06/06/2013, 06/13/2016  . Pneumococcal Conjugate-13 06/05/2015  . Pneumococcal Polysaccharide-23 04/06/2014  . Pneumococcal-Unspecified 09/04/1996  . Td 09/04/2005  . Tdap 03/22/2017   Past Surgical History:  Procedure Laterality  Date  . ABDOMINAL HYSTERECTOMY    . CARPOMETACARPEL SUSPENSION PLASTY Right 12/08/2013   Procedure: CARPOMETACARPEL [facility]) SUSPENSION PLASTY;  Surgeon: Jolyn Nap, MD;  Location: [place];  Service: Orthopedics;  Laterality: Right;  . COLONOSCOPY    . FOOT SURGERY Left 2008   Bunion and pulled tendons  . HAND SURGERY Right    Dog Bite  . I&D EXTREMITY Right 01/20/2017   Procedure: RIGHT HAND IRRIGATION AND DEBRIDEMENT AND REPAIR AS INDICATED;  Surgeon: Iran Planas, MD;  Location: [place];  Service: Orthopedics;  Laterality: Right;  . OOPHORECTOMY Right   . TUBAL LIGATION    . UPPER GASTROINTESTINAL ENDOSCOPY     FHx:    Reviewed / unchanged  SHx:    Reviewed / unchanged  Systems Review:  Constitutional: Denies fever, chills, wt changes, headaches, insomnia, fatigue, night sweats, change in appetite. Eyes: Denies redness, blurred vision, diplopia, discharge, itchy, watery eyes.  ENT: Denies discharge, congestion, post nasal drip, epistaxis, sore throat, earache, hearing loss, dental pain, tinnitus, vertigo, sinus pain, snoring.  CV: Denies chest pain, palpitations, irregular heartbeat, syncope, dyspnea, diaphoresis, orthopnea, PND, claudication or edema. Respiratory: denies cough, dyspnea, DOE, pleurisy, hoarseness, laryngitis, wheezing.  Gastrointestinal: Denies dysphagia, odynophagia, heartburn, reflux, water brash, abdominal pain or cramps, nausea, vomiting, bloating, diarrhea, constipation, hematemesis, melena, hematochezia  or hemorrhoids. Genitourinary: Denies dysuria, frequency, urgency, nocturia, hesitancy, discharge, hematuria or flank pain. Musculoskeletal: Denies arthralgias, myalgias, stiffness, jt. swelling, pain, limping or strain/sprain.  Skin: Denies pruritus, rash, hives, warts, acne, eczema or change in skin lesion(s). Neuro: No weakness, tremor, incoordination, spasms, paresthesia or pain. Psychiatric: Denies confusion, memory loss or sensory  loss. Endo: Denies change in weight, skin or hair change.  Heme/Lymph: No excessive bleeding, bruising or enlarged lymph nodes.  Physical Exam  BP 132/84   Pulse 76   Temp (!) 97.3 F (36.3 C)   Resp 16   Ht 5\' 3"  (1.6 m)   Wt 140 lb 12.8 oz (63.9 kg)   BMI 24.94 kg/m   Appears  well nourished, well groomed  and in no distress.  Eyes: PERRLA, EOMs, conjunctiva no swelling or erythema. Sinuses: No frontal/maxillary tenderness ENT/Mouth: EAC's clear, TM's nl w/o erythema, bulging. Nares clear w/o erythema, swelling, exudates. Oropharynx clear without erythema or exudates. Oral hygiene is good. Tongue normal, non obstructing. Hearing intact.  Neck: Supple. Thyroid not palpable. Car 2+/2+ without bruits, nodes or JVD. Chest: Respirations nl with BS clear & equal w/o rales, rhonchi, wheezing or stridor.  Cor: Heart sounds normal w/ regular rate and rhythm without sig. murmurs, gallops, clicks or rubs. Peripheral pulses normal and equal  without edema.  Abdomen: Soft & bowel sounds normal. Non-tender w/o guarding, rebound, hernias, masses or organomegaly.  Lymphatics: Unremarkable.  Musculoskeletal: Full ROM all peripheral extremities, joint stability, 5/5 strength and normal gait.  Skin: Warm, dry without exposed rashes, lesions or ecchymosis apparent.  Neuro: Cranial nerves intact, reflexes equal bilaterally. Sensory-motor testing grossly intact. Tendon reflexes grossly intact.  Pysch: Alert & oriented x 3.  Insight and judgement nl & appropriate. No ideations.  Assessment and Plan:  1. Essential hypertension  - Continue medication, monitor blood pressure at home.  - Continue DASH diet. Reminder to go to the ER if any CP,  SOB, nausea, dizziness, severe HA, changes vision/speech.  - CBC with Differential/Platelet - BASIC METABOLIC PANEL WITH GFR - Magnesium - TSH  2. Hyperlipidemia, mixed  - Continue diet/meds, exercise,& lifestyle modifications.  - Continue monitor  periodic cholesterol/liver & renal functions   - Hepatic function panel - Lipid panel - TSH  3. Abnormal glucose  - Continue diet, exercise, lifestyle modifications.  - Monitor appropriate labs.  - Hemoglobin A1c - Insulin, random  4. Vitamin D deficiency  - Continue supplementation.  - VITAMIN D 25 Hydroxyl  5. Gastroesophageal reflux disease   - Hemoglobin A1c - Insulin, random  6. Medication management  - CBC with Differential/Platelet - BASIC METABOLIC PANEL WITH GFR - Hepatic function panel - Magnesium - Lipid panel - TSH - Hemoglobin A1c - Insulin, random - VITAMIN D 25 Hydroxyl       Discussed  regular exercise, BP monitoring, weight control to achieve/maintain BMI less than 25 and discussed med and SE's. Recommended labs to assess and monitor clinical status with further disposition pending results of labs. Over 30 minutes of exam, counseling, chart review was performed.

## 2017-11-27 ENCOUNTER — Ambulatory Visit (INDEPENDENT_AMBULATORY_CARE_PROVIDER_SITE_OTHER): Payer: PPO | Admitting: Internal Medicine

## 2017-11-27 ENCOUNTER — Encounter: Payer: Self-pay | Admitting: Internal Medicine

## 2017-11-27 VITALS — BP 132/84 | HR 76 | Temp 97.3°F | Resp 16 | Ht 63.0 in | Wt 140.8 lb

## 2017-11-27 DIAGNOSIS — R7309 Other abnormal glucose: Secondary | ICD-10-CM

## 2017-11-27 DIAGNOSIS — K2100 Gastro-esophageal reflux disease with esophagitis, without bleeding: Secondary | ICD-10-CM

## 2017-11-27 DIAGNOSIS — E559 Vitamin D deficiency, unspecified: Secondary | ICD-10-CM | POA: Diagnosis not present

## 2017-11-27 DIAGNOSIS — Z79899 Other long term (current) drug therapy: Secondary | ICD-10-CM | POA: Diagnosis not present

## 2017-11-27 DIAGNOSIS — I10 Essential (primary) hypertension: Secondary | ICD-10-CM

## 2017-11-27 DIAGNOSIS — Z8249 Family history of ischemic heart disease and other diseases of the circulatory system: Secondary | ICD-10-CM | POA: Insufficient documentation

## 2017-11-27 DIAGNOSIS — E782 Mixed hyperlipidemia: Secondary | ICD-10-CM | POA: Diagnosis not present

## 2017-11-28 LAB — CBC WITH DIFFERENTIAL/PLATELET
Basophils Absolute: 32 cells/uL (ref 0–200)
Basophils Relative: 0.7 %
Eosinophils Absolute: 143 cells/uL (ref 15–500)
Eosinophils Relative: 3.1 %
HCT: 41.8 % (ref 35.0–45.0)
Hemoglobin: 14.2 g/dL (ref 11.7–15.5)
Lymphs Abs: 1385 cells/uL (ref 850–3900)
MCH: 30.3 pg (ref 27.0–33.0)
MCHC: 34 g/dL (ref 32.0–36.0)
MCV: 89.1 fL (ref 80.0–100.0)
MPV: 10 fL (ref 7.5–12.5)
Monocytes Relative: 11 %
Neutro Abs: 2535 cells/uL (ref 1500–7800)
Neutrophils Relative %: 55.1 %
Platelets: 226 10*3/uL (ref 140–400)
RBC: 4.69 10*6/uL (ref 3.80–5.10)
RDW: 12.8 % (ref 11.0–15.0)
Total Lymphocyte: 30.1 %
WBC mixed population: 506 cells/uL (ref 200–950)
WBC: 4.6 10*3/uL (ref 3.8–10.8)

## 2017-11-28 LAB — LIPID PANEL
Cholesterol: 169 mg/dL (ref <200)
HDL: 57 mg/dL (ref >50)
LDL Cholesterol (Calc): 92 mg/dL (calc)
Non-HDL Cholesterol (Calc): 112 mg/dL (calc) (ref <130)
Total CHOL/HDL Ratio: 3 (calc) (ref <5.0)
Triglycerides: 104 mg/dL (ref <150)

## 2017-11-28 LAB — MAGNESIUM: Magnesium: 2.2 mg/dL (ref 1.5–2.5)

## 2017-11-28 LAB — BASIC METABOLIC PANEL WITH GFR
BUN: 14 mg/dL (ref 7–25)
CO2: 26 mmol/L (ref 20–32)
Calcium: 9.4 mg/dL (ref 8.6–10.4)
Chloride: 108 mmol/L (ref 98–110)
Creat: 0.99 mg/dL (ref 0.50–0.99)
GFR, Est African American: 68 mL/min/{1.73_m2} (ref >=60)
GFR, Est Non African American: 59 mL/min/{1.73_m2} — ABNORMAL LOW (ref >=60)
Glucose, Bld: 94 mg/dL (ref 65–99)
Potassium: 3.7 mmol/L (ref 3.5–5.3)
Sodium: 142 mmol/L (ref 135–146)

## 2017-11-28 LAB — VITAMIN D 25 HYDROXY (VIT D DEFICIENCY, FRACTURES): Vit D, 25-Hydroxy: 56 ng/mL (ref 30–100)

## 2017-11-28 LAB — HEPATIC FUNCTION PANEL
AG Ratio: 1.9 (calc) (ref 1.0–2.5)
ALT: 15 U/L (ref 6–29)
AST: 26 U/L (ref 10–35)
Albumin: 4.3 g/dL (ref 3.6–5.1)
Alkaline phosphatase (APISO): 78 U/L (ref 33–130)
Bilirubin, Direct: 0.1 mg/dL (ref 0.0–0.2)
Globulin: 2.3 g/dL (calc) (ref 1.9–3.7)
Indirect Bilirubin: 0.5 mg/dL (calc) (ref 0.2–1.2)
Total Bilirubin: 0.6 mg/dL (ref 0.2–1.2)
Total Protein: 6.6 g/dL (ref 6.1–8.1)

## 2017-11-28 LAB — HEMOGLOBIN A1C
Hgb A1c MFr Bld: 5.4 % of total Hgb (ref <5.7)
Mean Plasma Glucose: 108 (calc)
eAG (mmol/L): 6 (calc)

## 2017-11-28 LAB — INSULIN, RANDOM: Insulin: 11.2 u[IU]/mL (ref 2.0–19.6)

## 2017-11-28 LAB — TSH: TSH: 1.6 mIU/L (ref 0.40–4.50)

## 2017-11-30 ENCOUNTER — Ambulatory Visit: Admission: RE | Admit: 2017-11-30 | Payer: Self-pay | Source: Ambulatory Visit

## 2017-11-30 ENCOUNTER — Ambulatory Visit
Admission: RE | Admit: 2017-11-30 | Discharge: 2017-11-30 | Disposition: A | Discharge status: Home / Self Care | Payer: PPO | Source: Ambulatory Visit | Attending: Physician Assistant | Admitting: Physician Assistant

## 2017-11-30 DIAGNOSIS — R921 Mammographic calcification found on diagnostic imaging of breast: Secondary | ICD-10-CM

## 2017-12-26 ENCOUNTER — Other Ambulatory Visit: Payer: Self-pay | Admitting: Physician Assistant

## 2017-12-26 ENCOUNTER — Telehealth: Payer: Self-pay | Admitting: Physician Assistant

## 2017-12-26 MED ORDER — GABAPENTIN 300 MG PO CAPS: ORAL_CAPSULE | ORAL | 2 refills | Status: DC

## 2017-12-26 MED ORDER — MELOXICAM 15 MG PO TABS: ORAL_TABLET | ORAL | 1 refills | Status: DC

## 2017-12-26 NOTE — Telephone Encounter (Signed)
Patient calling with mechanical neck pain x 2 weeks.  She has neck pain with shoulder pain, worse with movement, affecting her sleep. Will send in mobic (take with food) and gabaptenin start 1-2 at night, make OV next week if not better. If worse over weekend or any SOB/CP going to ER.

## 2017-12-26 NOTE — Telephone Encounter (Signed)
Pt has been made aware of RX results & voiced understanding

## 2018-01-13 ENCOUNTER — Other Ambulatory Visit: Payer: Self-pay | Admitting: Internal Medicine

## 2018-01-31 ENCOUNTER — Other Ambulatory Visit: Payer: Self-pay | Admitting: Adult Health

## 2018-02-05 ENCOUNTER — Ambulatory Visit: Payer: PPO | Admitting: Podiatry

## 2018-02-19 ENCOUNTER — Ambulatory Visit (INDEPENDENT_AMBULATORY_CARE_PROVIDER_SITE_OTHER): Payer: PPO | Admitting: Adult Health

## 2018-02-19 ENCOUNTER — Encounter: Payer: Self-pay | Admitting: Adult Health

## 2018-02-19 VITALS — BP 126/70 | HR 69 | Temp 97.5°F | Ht 63.0 in | Wt 137.0 lb

## 2018-02-19 DIAGNOSIS — F411 Generalized anxiety disorder: Secondary | ICD-10-CM | POA: Diagnosis not present

## 2018-02-19 DIAGNOSIS — R251 Tremor, unspecified: Secondary | ICD-10-CM

## 2018-02-19 LAB — CBC WITH DIFFERENTIAL/PLATELET
Basophils Absolute: 23 cells/uL (ref 0–200)
Basophils Relative: 0.3 %
Eosinophils Absolute: 146 cells/uL (ref 15–500)
Eosinophils Relative: 1.9 %
HCT: 40.8 % (ref 35.0–45.0)
Hemoglobin: 14 g/dL (ref 11.7–15.5)
Lymphs Abs: 2041 cells/uL (ref 850–3900)
MCH: 30.1 pg (ref 27.0–33.0)
MCHC: 34.3 g/dL (ref 32.0–36.0)
MCV: 87.7 fL (ref 80.0–100.0)
MPV: 10.3 fL (ref 7.5–12.5)
Monocytes Relative: 8 %
Neutro Abs: 4874 cells/uL (ref 1500–7800)
Neutrophils Relative %: 63.3 %
Platelets: 235 10*3/uL (ref 140–400)
RBC: 4.65 10*6/uL (ref 3.80–5.10)
RDW: 12.2 % (ref 11.0–15.0)
Total Lymphocyte: 26.5 %
WBC mixed population: 616 cells/uL (ref 200–950)
WBC: 7.7 10*3/uL (ref 3.8–10.8)

## 2018-02-19 LAB — COMPLETE METABOLIC PANEL WITH GFR
AG Ratio: 1.8 (calc) (ref 1.0–2.5)
ALT: 13 U/L (ref 6–29)
AST: 19 U/L (ref 10–35)
Albumin: 4.1 g/dL (ref 3.6–5.1)
Alkaline phosphatase (APISO): 80 U/L (ref 33–130)
BUN/Creatinine Ratio: 20 (calc) (ref 6–22)
BUN: 20 mg/dL (ref 7–25)
CO2: 25 mmol/L (ref 20–32)
Calcium: 9.3 mg/dL (ref 8.6–10.4)
Chloride: 107 mmol/L (ref 98–110)
Creat: 1.02 mg/dL — ABNORMAL HIGH (ref 0.50–0.99)
GFR, Est African American: 66 mL/min/{1.73_m2} (ref >=60)
GFR, Est Non African American: 57 mL/min/{1.73_m2} — ABNORMAL LOW (ref >=60)
Globulin: 2.3 g/dL (calc) (ref 1.9–3.7)
Glucose, Bld: 79 mg/dL (ref 65–99)
Potassium: 3.8 mmol/L (ref 3.5–5.3)
Sodium: 142 mmol/L (ref 135–146)
Total Bilirubin: 0.2 mg/dL (ref 0.2–1.2)
Total Protein: 6.4 g/dL (ref 6.1–8.1)

## 2018-02-19 LAB — TSH: TSH: 2.14 mIU/L (ref 0.40–4.50)

## 2018-02-19 MED ORDER — SERTRALINE HCL 100 MG PO TABS: 100.0000 mg | ORAL_TABLET | Freq: Every day | ORAL | 1 refills | Status: DC

## 2018-02-19 MED ORDER — TRAZODONE HCL 50 MG PO TABS: ORAL_TABLET | ORAL | 2 refills | Status: DC

## 2018-02-19 NOTE — Patient Instructions (Signed)
I am checking for hyperthyroid - and electrolyte problems  Increase zoloft to 100 MG daily, ok to use klonopin as prescribed - 1 mg twice daily as needed until symptoms improve  Can try benadryl at night for sleep, or trazodone 50 mg - start with 1/2 tab, increase to full tab if needed  PLEASE AVOID ALCOHOL AND CAFFEINE, DRINK PLENTY OF FLUIDS    Can try melatonin 5mg -15 mg at night for sleep, can also do benadryl 25-50mg  at night for sleep.  If this does not help we can try prescription medication.  Also here is some information about good sleep hygiene.   Insomnia Insomnia is frequent trouble falling and/or staying asleep. Insomnia can be a long term problem or a short term problem. Both are common. Insomnia can be a short term problem when the wakefulness is related to a certain stress or worry. Long term insomnia is often related to ongoing stress during waking hours and/or poor sleeping habits. Overtime, sleep deprivation itself can make the problem worse. Every little thing feels more severe because you are overtired and your ability to cope is decreased. CAUSES   Stress, anxiety, and depression.  Poor sleeping habits.  Distractions such as TV in the bedroom.  Naps close to bedtime.  Engaging in emotionally charged conversations before bed.  Technical reading before sleep.  Alcohol and other sedatives. They may make the problem worse. They can hurt normal sleep patterns and normal dream activity.  Stimulants such as caffeine for several hours prior to bedtime.  Pain syndromes and shortness of breath can cause insomnia.  Exercise late at night.  Changing time zones may cause sleeping problems (jet lag). It is sometimes helpful to have someone observe your sleeping patterns. They should look for periods of not breathing during the night (sleep apnea). They should also look to see how long those periods last. If you live alone or observers are uncertain, you can also be  observed at a sleep clinic where your sleep patterns will be professionally monitored. Sleep apnea requires a checkup and treatment. Give your caregivers your medical history. Give your caregivers observations your family has made about your sleep.  SYMPTOMS   Not feeling rested in the morning.  Anxiety and restlessness at bedtime.  Difficulty falling and staying asleep. TREATMENT   Your caregiver may prescribe treatment for an underlying medical disorders. Your caregiver can give advice or help if you are using alcohol or other drugs for self-medication. Treatment of underlying problems will usually eliminate insomnia problems.  Medications can be prescribed for short time use. They are generally not recommended for lengthy use.  Over-the-counter sleep medicines are not recommended for lengthy use. They can be habit forming.  You can promote easier sleeping by making lifestyle changes such as:  Using relaxation techniques that help with breathing and reduce muscle tension.  Exercising earlier in the day.  Changing your diet and the time of your last meal. No night time snacks.  Establish a regular time to go to bed.  Counseling can help with stressful problems and worry.  Soothing music and white noise may be helpful if there are background noises you cannot remove.  Stop tedious detailed work at least one hour before bedtime. HOME CARE INSTRUCTIONS   Keep a diary. Inform your caregiver about your progress. This includes any medication side effects. See your caregiver regularly. Take note of:  Times when you are asleep.  Times when you are awake during the night.  The  quality of your sleep.  How you feel the next day. This information will help your caregiver care for you.  Get out of bed if you are still awake after 15 minutes. Read or do some quiet activity. Keep the lights down. Wait until you feel sleepy and go back to bed.  Keep regular sleeping and waking hours.  Avoid naps.  Exercise regularly.  Avoid distractions at bedtime. Distractions include watching television or engaging in any intense or detailed activity like attempting to balance the household checkbook.  Develop a bedtime ritual. Keep a familiar routine of bathing, brushing your teeth, climbing into bed at the same time each night, listening to soothing music. Routines increase the success of falling to sleep faster.  Use relaxation techniques. This can be using breathing and muscle tension release routines. It can also include visualizing peaceful scenes. You can also help control troubling or intruding thoughts by keeping your mind occupied with boring or repetitive thoughts like the old concept of counting sheep. You can make it more creative like imagining planting one beautiful flower after another in your backyard garden.  During your day, work to eliminate stress. When this is not possible use some of the previous suggestions to help reduce the anxiety that accompanies stressful situations. MAKE SURE YOU:   Understand these instructions.  Will watch your condition.  Will get help right away if you are not doing well or get worse. Document Released: 08/18/2000 Document Revised: 11/13/2011 Document Reviewed: 09/18/2007 [facility] Patient Information 2015 [place], [place]. This information is not intended to replace advice given to you by your health care provider. Make sure you discuss any questions you have with your health care provider.     Hyperthyroidism Hyperthyroidism is when the thyroid is too active (overactive). Your thyroid is a large gland that is located in your neck. The thyroid helps to control how your body uses food (metabolism). When your thyroid is overactive, it produces too much of a hormone called thyroxine. What are the causes? Causes of hyperthyroidism may include:  Graves disease. This is when your immune system attacks the thyroid gland. This is the most  common cause.  Inflammation of the thyroid gland.  Tumor in the thyroid gland or somewhere else.  Excessive use of thyroid medicines, including: ? Prescription thyroid supplement. ? Herbal supplements that mimic thyroid hormones.  Solid or fluid-filled lumps within your thyroid gland (thyroid nodules).  Excessive ingestion of iodine.  What increases the risk?  Being female.  Having a family history of thyroid conditions. What are the signs or symptoms? Signs and symptoms of hyperthyroidism may include:  Nervousness.  Inability to tolerate heat.  Unexplained weight loss.  Diarrhea.  Change in the texture of hair or skin.  Heart skipping beats or making extra beats.  Rapid heart rate.  Loss of menstruation.  Shaky hands.  Fatigue.  Restlessness.  Increased appetite.  Sleep problems.  Enlarged thyroid gland or nodules.  How is this diagnosed? Diagnosis of hyperthyroidism may include:  Medical history and physical exam.  Blood tests.  Ultrasound tests.  How is this treated? Treatment may include:  Medicines to control your thyroid.  Surgery to remove your thyroid.  Radiation therapy.  Follow these instructions at home:  Take medicines only as directed by your health care provider.  Do not use any tobacco products, including cigarettes, chewing tobacco, or electronic cigarettes. If you need help quitting, ask your health care provider.  Do not exercise or do physical activity  until your health care provider approves.  Keep all follow-up appointments as directed by your health care provider. This is important. Contact a health care provider if:  Your symptoms do not get better with treatment.  You have fever.  You are taking thyroid replacement medicine and you: ? Have depression. ? Feel mentally and physically slow. ? Have weight gain. Get help right away if:  You have decreased alertness or a change in your awareness.  You have  abdominal pain.  You feel dizzy.  You have a rapid heartbeat.  You have an irregular heartbeat. This information is not intended to replace advice given to you by your health care provider. Make sure you discuss any questions you have with your health care provider. Document Released: 08/21/2005 Document Revised: 01/20/2016 Document Reviewed: 01/06/2014 Elsevier Interactive Patient Education  2018 [employer].

## 2018-02-19 NOTE — Progress Notes (Signed)
Assessment and Plan:  Latoya [name] was seen today for fatigue, shaking and insomnia.  Diagnoses and all orders for this visit:  Generalized anxiety disorder Ok to increase klonopin use as prescribed until zoloft dose increase stabilizes Stress management techniques discussed, increase water, good sleep hygiene discussed, increase exercise, and increase veggies. -     sertraline (ZOLOFT) 100 MG tablet; Take 1 tablet (100 mg total) by mouth daily.  Shakiness R/o thyroid, electrolyte abnormality -     CBC with Differential/Platelet -     COMPLETE METABOLIC PANEL WITH GFR -     TSH  Other orders Insomnia- good sleep hygiene discussed, increase day time activity, try melatonin or benadryl if this does not help we will try sleep medication.  -     traZODone (DESYREL) 50 MG tablet; 1/2-1 tablet for sleep  Further disposition pending results of labs. Discussed med's effects and SE's.   Over 15 minutes of exam, counseling, chart review, and critical decision making was performed.   Future Appointments  Date Time Provider [place]  04/01/2018 10:00 AM [name], NP [facility] None  07/03/2018 10:30 AM [name], NP [facility] None    ------------------------------------------------------------------------------------------------------------------   HPI BP 126/70   Pulse 69   Temp (!) 97.5 F (36.4 C)   Ht 5\' 3"  (1.6 m)   Wt 137 lb (62.1 kg)   SpO2 99%   BMI 24.27 kg/m   67 y.o.female presents for evaluation; she reports she is unable to sleep, feels weak and drained, feels anxious, shaky. She reports this is ongoing for about 1 month. No distinct instigating event. She is currently prescribed klonopin 1 mg, reports she has been taking 1/4 tab as needed which does stop the shaking. She is on zoloft 50 mg, was decreased from 100 mg earlier this year. She does report remote history of being on "thyroid pills"  - cannot explain why they were d/c'd. She denies recent  palpitations, chest pain, dyspnea. She does endorse ongoing heat intolerance.   Was prescribed gabapentin for sleep but didn't like how it made her feel.   Does seem to be losing weight; reports good appetite, not trying to lose weight.   BMI is Body mass index is 24.27 kg/m. Wt Readings from Last 3 Encounters:  02/19/18 137 lb (62.1 kg)  11/27/17 140 lb 12.8 oz (63.9 kg)  11/12/17 141 lb (64 kg)     Past Medical History:  Diagnosis Date  . Anxiety and depression   . Arthritis   . Colon polyps   . GERD (gastroesophageal reflux disease)   . Hemorrhoids   . Hiatal hernia   . History of kidney stones    passed  . Hyperlipemia   . Mixed hyperlipidemia 11/26/2013  . Panic disorder   . Renal insufficiency    stage 3 kidney disease per her PCP  . Stomach ulcer   . Unspecified essential hypertension 11/26/2013     Allergies  Allergen Reactions  . Other     All pain medications: Unknown/ CODEINE  . Prednisone Other (See Comments)    DYSPHORIA  . Vioxx [Rofecoxib]     unknown  . Entex T [Pseudoephedrine-Guaifenesin] Palpitations  . Levaquin [Levofloxacin] Rash    Tolerated Avelox without adverse effect 11/08/15.  . Sulfa Antibiotics Rash  . Trovan [Alatrofloxacin] Rash    Current Outpatient Medications on File Prior to Visit  Medication Sig  . aspirin 81 MG tablet Take 81 mg by mouth at bedtime.   . Biotin  10000 MCG TABS Take by mouth daily.  . cholecalciferol (VITAMIN D) 1000 units tablet Take 1,000 Units by mouth daily.  . clonazePAM (KLONOPIN) 1 MG tablet Take 1 mg by mouth 2 (two) times daily as needed for anxiety.   . dicyclomine (BENTYL) 10 MG capsule Take every 6 hours as needed for cramping abdominal pain.  Marland Kitchen diltiazem (CARDIZEM) 120 MG tablet TAKE 1 TABLET(120 MG) BY MOUTH TWICE DAILY  . famotidine (PEPCID) 20 MG tablet Take 2 tablets (40 mg total) by mouth 2 (two) times daily.  . furosemide (LASIX) 20 MG tablet TAKE 1 TABLET BY MOUTH EVERY DAY (Patient taking  differently: prn)  . levalbuterol (XOPENEX) 1.25 MG/3ML nebulizer solution Take 1.25 mg by nebulization every 8 (eight) hours as needed for wheezing or shortness of breath.  . potassium chloride SA (K-DUR,KLOR-CON) 20 MEQ tablet TAKE 1 TABLET BY MOUTH TWICE DAILY (Patient taking differently: PRN)  . pravastatin (PRAVACHOL) 40 MG tablet TAKE 1 TABLET BY MOUTH ONCE DAILY  . sertraline (ZOLOFT) 100 MG tablet Take 0.5 tablets (50 mg total) by mouth daily.  Marland Kitchen gabapentin (NEURONTIN) 300 MG capsule 1-2 at night, 1-3 hours before bed (Patient not taking: Reported on 02/19/2018)  . meloxicam (MOBIC) 15 MG tablet TAKE 1 TABLET BY MOUTH DAILY WITH FOOD FOR 2 WEEKS, THEN AS NEEDED DAILY FOR PAIN(MAY TAKE WITH TYLENOL; CAN NOT TAKE WITH ALEVE/IBUPROFEN) (Patient not taking: Reported on 02/19/2018)   No current facility-administered medications on file prior to visit.     ROS: Review of Systems  Constitutional: Positive for diaphoresis, malaise/fatigue and weight loss. Negative for chills and fever.  HENT: Negative.   Eyes: Negative for blurred vision and double vision.  Respiratory: Negative for cough, sputum production, shortness of breath and wheezing.   Cardiovascular: Negative for chest pain, palpitations, orthopnea, claudication, leg swelling and PND.  Gastrointestinal: Negative for abdominal pain, blood in stool, constipation, diarrhea, heartburn, melena, nausea and vomiting.  Genitourinary: Negative.   Musculoskeletal: Negative for joint pain and myalgias.  Skin: Negative for rash.  Neurological: Negative for dizziness, tingling, tremors ("shaky"), sensory change, speech change, focal weakness, seizures, loss of consciousness, weakness and headaches.  Endo/Heme/Allergies: Negative for polydipsia.  Psychiatric/Behavioral: Negative for depression, memory loss, substance abuse and suicidal ideas. The patient is nervous/anxious and has insomnia.      Physical Exam:  BP 126/70   Pulse 69   Temp (!)  97.5 F (36.4 C)   Ht 5\' 3"  (1.6 m)   Wt 137 lb (62.1 kg)   SpO2 99%   BMI 24.27 kg/m   General Appearance: Well nourished, in no apparent distress. Eyes: PERRLA, EOMs, conjunctiva no swelling or erythema Sinuses: No Frontal/maxillary tenderness ENT/Mouth: Ext aud canals clear, TMs without erythema, bulging. No erythema, swelling, or exudate on post pharynx.  Tonsils not swollen or erythematous. Hearing normal.  Neck: Supple, thyroid normal - not enlarged, without palpable nodules  Respiratory: Respiratory effort normal, BS equal bilaterally without rales, rhonchi, wheezing or stridor.  Cardio: RRR with no MRGs. Brisk peripheral pulses without edema.  Abdomen: Soft, + BS.  Non tender, no guarding, rebound, hernias, masses. Lymphatics: Non tender without lymphadenopathy.  Musculoskeletal: Full ROM, 5/5 strength, normal gait.  Skin: Warm, dry without rashes, lesions, ecchymosis.  Neuro: Cranial nerves intact. Normal muscle tone, no cerebellar symptoms. Sensation intact. Reflexes 2+ normal throughout Psych: Awake and oriented X 3, anxious affect, Insight and Judgment appropriate.     Izora Ribas, NP 2:48 PM [facility]

## 2018-03-01 ENCOUNTER — Other Ambulatory Visit: Payer: Self-pay | Admitting: Internal Medicine

## 2018-03-23 ENCOUNTER — Other Ambulatory Visit: Payer: Self-pay | Admitting: Adult Health

## 2018-03-24 ENCOUNTER — Other Ambulatory Visit: Payer: Self-pay | Admitting: Internal Medicine

## 2018-03-29 NOTE — Progress Notes (Signed)
CPE  Assessment and Plan:    Encounter for general adult medical examination with abnormal findings  Essential hypertension - continue medications, DASH diet, exercise and monitor at home. Call if greater than 130/80.  - Defer EKG to cardiology office, just had 10/2017 -     CBC with Differential/Platelet -     CMP/GFR -     TSH  Aortic atherosclerosis ([place]) Control blood pressure, cholesterol, glucose, increase exercise.   Thyroid nodule Hypothyroidism-check TSH level, continue medications the same, reminded to take on an empty stomach 30-93mins before food.  UTD on monitoring, follow up US in 5 years -05/2022 -     TSH  CKD (chronic kidney disease) stage 3, GFR 30-59 ml/min Increase fluids, avoid NSAIDS, monitor sugars, will monitor       -     CMP/GFR -     UA -     microalbumin  Mixed hyperlipidemia -continue medications, check lipids, decrease fatty foods, increase activity.  -     Lipid panel  Other abnormal glucose Discussed general issues about diabetes pathophysiology and management., Educational material distributed., Suggested low cholesterol diet., Encouraged aerobic exercise., Discussed foot care., Reminded to get yearly retinal exam.       -     A1C  Medication management -     Magnesium  Hiatal hernia Well managed on current medications Discussed diet, avoiding triggers and other lifestyle changes  Chronic rhinitis - Allegra OTC, increase H20, allergy hygiene explained.  Gastroesophageal reflux disease, esophagitis presence not specified Continue PPI/H2 blocker, diet discussed  Generalized anxiety disorder continue medications, stress management techniques discussed, increase water, good sleep hygiene discussed, increase exercise, and increase veggies.   Vitamin D deficiency Continue supplementation Check vitamin D level  Screening colon cancer       -      3 step hemoccult given  Over 40 minutes of exam, counseling, chart review and critical  decision making was performed Future Appointments  Date Time Provider [place]  04/01/2018 10:00 AM [name], NP [facility] None  07/03/2018 10:30 AM [name], NP [facility] None     Subjective:  Latoya [name] is a 67 y.o. female who presents for CPE. 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 She had recent normal ECHO and eval by Dr. Stanford Breed in 10/2017. She has no complaints today.   She is on zoloft and trazodone, and on klonopin rarely for anxiety/depression. She is somewhat stressed as her husband has respiratory failure and she does the majority of work around the house.   BMI is Body mass index is 24.45 kg/m., she has been working on diet and exercise. Wt Readings from Last 3 Encounters:  04/01/18 138 lb (62.6 kg)  02/19/18 137 lb (62.1 kg)  11/27/17 140 lb 12.8 oz (63.9 kg)   Her blood pressure has been controlled at home, today their BP is BP: 120/66  She does workout, walking, yard work, house work. She denies chest pain, shortness of breath, dizziness.   She is on cholesterol medication and denies myalgias. Her cholesterol is not at goal. The cholesterol last visit was:   Lab Results  Component Value Date   CHOL 169 11/27/2017  HDL 57 11/27/2017   LDLCALC 92 11/27/2017   TRIG 104 11/27/2017   CHOLHDL 3.0 11/27/2017    She has been working on diet and exercise for prediabetes, and denies paresthesia of the feet, polydipsia, polyuria and visual disturbances. Last A1C in the office was:  Lab Results  Component Value Date   HGBA1C 5.4  11/27/2017   Last GFR: Lab Results  Component Value Date   GFRNONAA 57 (L) 02/19/2018   Patient is on Vitamin D supplement.   Lab Results  Component Value Date   VD25OH 56 11/27/2017        Medication Review: Current Outpatient Medications on File Prior to Visit  Medication Sig  . aspirin 81 MG tablet Take 81 mg by mouth at bedtime.   . Biotin 10000 MCG TABS Take by mouth daily.  . cholecalciferol (VITAMIN D) 1000 units tablet Take 1,000 Units by mouth daily.  . clonazePAM (KLONOPIN) 1 MG tablet Take 1 mg by mouth 2 (two) times daily as needed for anxiety.   . dicyclomine (BENTYL) 10 MG capsule Take every 6 hours as needed for cramping abdominal pain.  Marland Kitchen diltiazem (CARDIZEM) 120 MG tablet Take 1 tablet   2 x /day for BP  . famotidine (PEPCID) 20 MG tablet Take 2 tablets (40 mg total) by mouth 2 (two) times daily.  . furosemide (LASIX) 20 MG tablet TAKE 1 TABLET BY MOUTH EVERY DAY (Patient taking differently: prn)  . gabapentin (NEURONTIN) 300 MG capsule TAKE 1 TO 2 CAPSULES BY MOUTH EVERY NIGHT 1 TO 3 HOURS BEFORE BEDTIME  . levalbuterol (XOPENEX) 1.25 MG/3ML nebulizer solution Take 1.25 mg by nebulization every 8 (eight) hours as needed for wheezing or shortness of breath.  . meloxicam (MOBIC) 15 MG tablet TAKE 1 TABLET BY MOUTH DAILY WITH FOOD FOR 2 WEEKS, THEN AS NEEDED DAILY FOR PAIN(MAY TAKE WITH TYLENOL; CAN NOT TAKE WITH ALEVE/IBUPROFEN)  . potassium chloride SA (K-DUR,KLOR-CON) 20 MEQ tablet TAKE 1 TABLET BY MOUTH TWICE DAILY (Patient taking differently: PRN)  . pravastatin (PRAVACHOL) 40 MG tablet TAKE 1 TABLET BY MOUTH EVERY DAY  . sertraline (ZOLOFT) 100 MG tablet Take 1 tablet (100 mg total) by mouth daily.  . traZODone (DESYREL) 50 MG tablet 1/2-1 tablet for sleep   No current facility-administered medications on file prior to visit.      Allergies  Allergen Reactions  . Other     All pain medications: Unknown/ CODEINE  . Prednisone Other (See Comments)     DYSPHORIA  . Vioxx [Rofecoxib]     unknown  . Entex T [Pseudoephedrine-Guaifenesin] Palpitations  . Levaquin [Levofloxacin] Rash    Tolerated Avelox without adverse effect 11/08/15.  . Sulfa Antibiotics Rash  . Trovan [Alatrofloxacin] Rash    Current Problems (verified) Patient Active Problem List   Diagnosis Date Noted  . FHx: heart disease 11/27/2017  . Seborrheic keratosis 06/20/2017  . 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 Laryngopharyngeal reflux (LPR) 10/12/2016  . Chronic rhinitis 10/09/2016  . Hiatal hernia 07/14/2016  . Aortic atherosclerosis ([place]) 06/30/2016  . CKD (chronic kidney disease) stage 3, GFR 30-59 ml/min (HCC) 06/30/2016  . Thyroid nodule 12/24/2015  . Abnormal glucose 05/01/2014  . Vitamin D deficiency 05/01/2014  . Medication management 05/01/2014  . Essential hypertension 11/26/2013  . Mixed hyperlipidemia 11/26/2013  . Chronic cough 09/19/2013  . Generalized anxiety disorder 07/11/2013  . Esophageal reflux 11/29/2012    Screening Tests Immunization History  Administered Date(s) Administered  . Influenza Split  06/04/2014, 06/05/2015  . Influenza, High Dose Seasonal PF 05/30/2017  . Influenza-Unspecified 06/06/2013, 06/13/2016  . Pneumococcal Conjugate-13 06/05/2015  . Pneumococcal Polysaccharide-23 04/06/2014  . Pneumococcal-Unspecified 09/04/1996  . Td 09/04/2005  . Tdap 03/22/2017   Preventative care: Last colonoscopy: 12/2012 [facility] Purl 10 year follow up  EGD 10/2017 Last mammogram: 11/2017  Last pap smear/pelvic exam: 2016, never abnormal, s/p hysterectomy  DEXA: dec 2014, reports has had since, will request report PFT 02/2016 [place] AB 02/2015 US thyroid 01/2016, 05/28/2017, stable, recommend 5 year follow up in 2023  Prior vaccinations: TD or Tdap: 2018  Influenza: 2018 Pneumococcal: 2015 Prevnar13: 2016 Shingles/Zostavax: declines due to cost  Names of Other Physician/Practitioners you currently use: 1. [facility] here for primary care 2. Un[facility], eye doctor, last visit 2018, has appointment 05/2018 3. Ladell Pier, dentist, last visit 2019 q 6 months  Patient Care Team: Unk Pinto, MD as PCP - General (Internal Medicine) Aquilla Hacker, MD as Referring Physician (Psychiatry) Jannette Spanner, MD as Referring Physician (Dermatology)  SURGICAL HISTORY She  has a past surgical history that includes Abdominal hysterectomy; Tubal ligation; Foot surgery (Left, 2008); Colonoscopy; Upper gastrointestinal endoscopy; Oophorectomy (Right); Carpometacarpel [facility]) suspension plasty (Right, 12/08/2013); I&D extremity (Right, 01/20/2017); and Hand surgery (Right). FAMILY HISTORY Her family history includes Asthma in her mother; Dementia in her brother; Heart attack in her brother; Heart disease in her brother and father; Pancreatic cancer in her brother. SOCIAL HISTORY She  reports that she is a non-smoker but has been exposed to tobacco smoke. She has never used smokeless tobacco. She reports that she does not drink alcohol or use drugs.   Review of Systems  Constitutional: Negative.  Negative for malaise/fatigue and weight loss.  HENT: Negative.  Negative for hearing loss and tinnitus.   Eyes: Negative.  Negative for blurred vision and double vision.  Respiratory: Negative.  Negative for cough, sputum production, shortness of breath and wheezing.   Cardiovascular: Negative.  Negative for chest pain, palpitations, orthopnea, claudication, leg swelling and PND.  Gastrointestinal: Negative.  Negative for abdominal pain, blood in stool, constipation, diarrhea, heartburn, melena, nausea and vomiting.  Genitourinary: Negative.   Musculoskeletal: Positive for back pain (Chronic, had injections, declines further interventions). Negative for falls, joint pain and myalgias.  Skin: Negative.  Negative for rash.  Neurological: Negative for dizziness, tingling, tremors, sensory change, speech  change, focal weakness, seizures, loss of consciousness, weakness and headaches.  Endo/Heme/Allergies: Negative.  Negative for polydipsia.  Psychiatric/Behavioral: Negative.  Negative for depression, memory loss, substance abuse and suicidal ideas. The patient is not nervous/anxious and does not have insomnia.   All other systems reviewed and are negative.   Objective:     Today's Vitals   04/01/18 0925  BP: 120/66  Pulse: 69  Temp: 97.7 F (36.5 C)  SpO2: 98%  Weight: 138 lb (62.6 kg)  Height: 5\' 3"  (1.6 m)   Body mass index is 24.45 kg/m.  General appearance: alert, no distress, WD/WN, female HEENT: normocephalic, sclerae anicteric, TMs pearly, nares patent, no discharge or erythema, pharynx normal Oral cavity: MMM, no lesions Neck: supple, no lymphadenopathy, no thyromegaly, no masses Heart: RRR, normal S1, S2, no murmurs Lungs: CTA bilaterally, no wheezes, rhonchi, or rales Abdomen: +bs, soft, non tender, non distended, no masses, no hepatomegaly, no splenomegaly Musculoskeletal: nontender, no swelling, no obvious deformity Extremities: no edema, no cyanosis, no clubbing Pulses: 2+ symmetric, upper and lower extremities, normal cap refill Neurological: alert, oriented x 3,  CN2-12 intact, strength normal upper extremities and lower extremities, sensation normal throughout, DTRs 2+  no cerebellar signs, gait normal Psychiatric: normal affect, behavior normal, pleasant  Breasts: symmetrical without palpable lumps, texture intake, nipples not inverted, no discharge, no axillary lymphadenopathy GYN: defer  EKG 02/201 Dr. Stanford Breed WNL  Izora Ribas, NP   04/01/2018

## 2018-03-31 ENCOUNTER — Other Ambulatory Visit: Payer: Self-pay | Admitting: Physician Assistant

## 2018-04-01 ENCOUNTER — Ambulatory Visit (INDEPENDENT_AMBULATORY_CARE_PROVIDER_SITE_OTHER): Payer: PPO | Admitting: Adult Health

## 2018-04-01 ENCOUNTER — Encounter: Payer: Self-pay | Admitting: Adult Health

## 2018-04-01 VITALS — BP 120/66 | HR 69 | Temp 97.7°F | Ht 63.0 in | Wt 138.0 lb

## 2018-04-01 DIAGNOSIS — I10 Essential (primary) hypertension: Secondary | ICD-10-CM

## 2018-04-01 DIAGNOSIS — R05 Cough: Secondary | ICD-10-CM

## 2018-04-01 DIAGNOSIS — Z0001 Encounter for general adult medical examination with abnormal findings: Secondary | ICD-10-CM

## 2018-04-01 DIAGNOSIS — K219 Gastro-esophageal reflux disease without esophagitis: Secondary | ICD-10-CM

## 2018-04-01 DIAGNOSIS — E041 Nontoxic single thyroid nodule: Secondary | ICD-10-CM

## 2018-04-01 DIAGNOSIS — J310 Chronic rhinitis: Secondary | ICD-10-CM

## 2018-04-01 DIAGNOSIS — R7309 Other abnormal glucose: Secondary | ICD-10-CM

## 2018-04-01 DIAGNOSIS — N183 Chronic kidney disease, stage 3 unspecified: Secondary | ICD-10-CM

## 2018-04-01 DIAGNOSIS — K449 Diaphragmatic hernia without obstruction or gangrene: Secondary | ICD-10-CM

## 2018-04-01 DIAGNOSIS — Z1211 Encounter for screening for malignant neoplasm of colon: Secondary | ICD-10-CM

## 2018-04-01 DIAGNOSIS — R053 Chronic cough: Secondary | ICD-10-CM

## 2018-04-01 DIAGNOSIS — L821 Other seborrheic keratosis: Secondary | ICD-10-CM

## 2018-04-01 DIAGNOSIS — E782 Mixed hyperlipidemia: Secondary | ICD-10-CM

## 2018-04-01 DIAGNOSIS — Q858 Other phakomatoses, not elsewhere classified: Secondary | ICD-10-CM

## 2018-04-01 DIAGNOSIS — Z136 Encounter for screening for cardiovascular disorders: Secondary | ICD-10-CM

## 2018-04-01 DIAGNOSIS — L814 Other melanin hyperpigmentation: Secondary | ICD-10-CM

## 2018-04-01 DIAGNOSIS — Z6824 Body mass index (BMI) 24.0-24.9, adult: Secondary | ICD-10-CM

## 2018-04-01 DIAGNOSIS — F411 Generalized anxiety disorder: Secondary | ICD-10-CM

## 2018-04-01 DIAGNOSIS — I700 Atherosclerosis of aorta: Secondary | ICD-10-CM

## 2018-04-01 DIAGNOSIS — Z0000 Encounter for general adult medical examination without abnormal findings: Secondary | ICD-10-CM

## 2018-04-01 DIAGNOSIS — Z79899 Other long term (current) drug therapy: Secondary | ICD-10-CM

## 2018-04-01 DIAGNOSIS — Q8789 Other specified congenital malformation syndromes, not elsewhere classified: Secondary | ICD-10-CM

## 2018-04-01 DIAGNOSIS — E559 Vitamin D deficiency, unspecified: Secondary | ICD-10-CM

## 2018-04-01 NOTE — Patient Instructions (Signed)
Aim for 7+ servings of fruits and vegetables daily  65-80+ fluid ounces of water or unsweet tea for healthy kidneys  Limit animal fats in diet for cholesterol and heart health - choose grass fed whenever available  Aim for low stress - take time to unwind and care for your mental health  Aim for 150 min of moderate intensity exercise weekly for heart health, and weights twice weekly for bone health  Aim for 7-9 hours of sleep daily      When it comes to diets, agreement about the perfect plan isn't easy to find, even among the experts. Experts at the [employer] developed an idea known as the Healthy Eating Plate. Just imagine a plate divided into logical, healthy portions.  The emphasis is on diet quality:  Load up on vegetables and fruits - one-half of your plate: Aim for color and variety, and remember that potatoes don't count.  Go for whole grains - one-quarter of your plate: Whole wheat, barley, wheat berries, quinoa, oats, brown rice, and foods made with them. If you want pasta, go with whole wheat pasta.  Protein power - one-quarter of your plate: Fish, chicken, beans, and nuts are all healthy, versatile protein sources. Limit red meat.  The diet, however, does go beyond the plate, offering a few other suggestions.  Use healthy plant oils, such as olive, canola, soy, corn, sunflower and peanut. Check the labels, and avoid partially hydrogenated oil, which have unhealthy trans fats.  If you're thirsty, drink water. Coffee and tea are good in moderation, but skip sugary drinks and limit milk and dairy products to one or two daily servings.  The type of carbohydrate in the diet is more important than the amount. Some sources of carbohydrates, such as vegetables, fruits, whole grains, and beans-are healthier than others.  Finally, stay active.

## 2018-04-03 LAB — MICROALBUMIN / CREATININE URINE RATIO
Creatinine, Urine: 271 mg/dL (ref 20–275)
Microalb Creat Ratio: 23 mcg/mg creat (ref <30)
Microalb, Ur: 6.3 mg/dL

## 2018-04-03 LAB — URINE CULTURE
MICRO NUMBER:: 90898733
SPECIMEN QUALITY:: ADEQUATE

## 2018-04-03 LAB — COMPLETE METABOLIC PANEL WITH GFR
AG Ratio: 2 (calc) (ref 1.0–2.5)
ALT: 14 U/L (ref 6–29)
AST: 25 U/L (ref 10–35)
Albumin: 4.3 g/dL (ref 3.6–5.1)
Alkaline phosphatase (APISO): 86 U/L (ref 33–130)
BUN/Creatinine Ratio: 16 (calc) (ref 6–22)
BUN: 16 mg/dL (ref 7–25)
CO2: 27 mmol/L (ref 20–32)
Calcium: 9.4 mg/dL (ref 8.6–10.4)
Chloride: 108 mmol/L (ref 98–110)
Creat: 1.01 mg/dL — ABNORMAL HIGH (ref 0.50–0.99)
GFR, Est African American: 67 mL/min/{1.73_m2} (ref >=60)
GFR, Est Non African American: 58 mL/min/{1.73_m2} — ABNORMAL LOW (ref >=60)
Globulin: 2.2 g/dL (calc) (ref 1.9–3.7)
Glucose, Bld: 99 mg/dL (ref 65–99)
Potassium: 3.9 mmol/L (ref 3.5–5.3)
Sodium: 143 mmol/L (ref 135–146)
Total Bilirubin: 0.4 mg/dL (ref 0.2–1.2)
Total Protein: 6.5 g/dL (ref 6.1–8.1)

## 2018-04-03 LAB — URINALYSIS W MICROSCOPIC + REFLEX CULTURE
Bacteria, UA: NONE SEEN /HPF
Bilirubin Urine: NEGATIVE
Glucose, UA: NEGATIVE
Hgb urine dipstick: NEGATIVE
Hyaline Cast: NONE SEEN /LPF
Ketones, ur: NEGATIVE
Nitrites, Initial: NEGATIVE
Specific Gravity, Urine: 1.022 (ref 1.001–1.03)
Squamous Epithelial / LPF: NONE SEEN /HPF (ref <=5)
pH: 6.5 (ref 5.0–8.0)

## 2018-04-03 LAB — CBC WITH DIFFERENTIAL/PLATELET
Basophils Absolute: 43 cells/uL (ref 0–200)
Basophils Relative: 0.6 %
Eosinophils Absolute: 78 cells/uL (ref 15–500)
Eosinophils Relative: 1.1 %
HCT: 41.2 % (ref 35.0–45.0)
Hemoglobin: 13.9 g/dL (ref 11.7–15.5)
Lymphs Abs: 1491 cells/uL (ref 850–3900)
MCH: 29.7 pg (ref 27.0–33.0)
MCHC: 33.7 g/dL (ref 32.0–36.0)
MCV: 88 fL (ref 80.0–100.0)
MPV: 10.3 fL (ref 7.5–12.5)
Monocytes Relative: 5.9 %
Neutro Abs: 5069 cells/uL (ref 1500–7800)
Neutrophils Relative %: 71.4 %
Platelets: 217 10*3/uL (ref 140–400)
RBC: 4.68 10*6/uL (ref 3.80–5.10)
RDW: 13.4 % (ref 11.0–15.0)
Total Lymphocyte: 21 %
WBC mixed population: 419 cells/uL (ref 200–950)
WBC: 7.1 10*3/uL (ref 3.8–10.8)

## 2018-04-03 LAB — VITAMIN D 25 HYDROXY (VIT D DEFICIENCY, FRACTURES): Vit D, 25-Hydroxy: 62 ng/mL (ref 30–100)

## 2018-04-03 LAB — LIPID PANEL
Cholesterol: 167 mg/dL (ref <200)
HDL: 49 mg/dL — ABNORMAL LOW (ref >50)
LDL Cholesterol (Calc): 92 mg/dL (calc)
Non-HDL Cholesterol (Calc): 118 mg/dL (calc) (ref <130)
Total CHOL/HDL Ratio: 3.4 (calc) (ref <5.0)
Triglycerides: 165 mg/dL — ABNORMAL HIGH (ref <150)

## 2018-04-03 LAB — MAGNESIUM: Magnesium: 2.3 mg/dL (ref 1.5–2.5)

## 2018-04-03 LAB — HEMOGLOBIN A1C
Hgb A1c MFr Bld: 5.7 % of total Hgb — ABNORMAL HIGH (ref <5.7)
Mean Plasma Glucose: 117 (calc)
eAG (mmol/L): 6.5 (calc)

## 2018-04-03 LAB — TSH: TSH: 1.85 mIU/L (ref 0.40–4.50)

## 2018-04-03 LAB — CULTURE INDICATED

## 2018-04-12 ENCOUNTER — Other Ambulatory Visit: Payer: Self-pay

## 2018-04-12 DIAGNOSIS — Z1211 Encounter for screening for malignant neoplasm of colon: Secondary | ICD-10-CM

## 2018-04-12 LAB — POC HEMOCCULT BLD/STL (HOME/3-CARD/SCREEN)
Card #2 Fecal Occult Blod, POC: NEGATIVE
Card #3 Fecal Occult Blood, POC: NEGATIVE
Fecal Occult Blood, POC: NEGATIVE

## 2018-04-26 ENCOUNTER — Ambulatory Visit (INDEPENDENT_AMBULATORY_CARE_PROVIDER_SITE_OTHER): Payer: PPO | Admitting: Physician Assistant

## 2018-04-26 ENCOUNTER — Encounter: Payer: Self-pay | Admitting: Physician Assistant

## 2018-04-26 VITALS — BP 120/92 | HR 80 | Temp 98.6°F | Resp 16 | Ht 63.0 in | Wt 138.0 lb

## 2018-04-26 DIAGNOSIS — J029 Acute pharyngitis, unspecified: Secondary | ICD-10-CM

## 2018-04-26 DIAGNOSIS — F411 Generalized anxiety disorder: Secondary | ICD-10-CM | POA: Diagnosis not present

## 2018-04-26 LAB — POCT RAPID STREP A (OFFICE): Rapid Strep A Screen: NEGATIVE

## 2018-04-26 MED ORDER — AMOXICILLIN-POT CLAVULANATE 875-125 MG PO TABS: 1.0000 | ORAL_TABLET | Freq: Two times a day (BID) | ORAL | 0 refills | Status: DC

## 2018-04-26 MED ORDER — PREDNISONE 10 MG PO TABS: ORAL_TABLET | ORAL | 0 refills | Status: DC

## 2018-04-26 MED ORDER — CLONAZEPAM 0.5 MG PO TABS: 0.5000 mg | ORAL_TABLET | Freq: Two times a day (BID) | ORAL | 0 refills | Status: DC | PRN

## 2018-04-26 NOTE — Progress Notes (Signed)
Subjective:    Patient ID: Latoya [name], female    DOB: [birth year], 67 y.o.   MRN: [id number]  HPI [name] presents with cold symptoms x 3 days. She has hoarseness, sore throat, HA. She has started on tylenol cold pill. She has sinus drainage. She denies fever, chills, SOB, cough.   She is getting klonopin from another doctor but is no longer seeing them and requesting a refill here. She was getting from Dr. Maudie Mercury, she is on zoloft, she will take 1/4-1/2 take 2-3 x a week.   Blood pressure (!) 120/92, pulse 80, temperature 98.6 F (37 C), resp. rate 16, height 5\' 3"  (1.6 m), weight 138 lb (62.6 kg), SpO2 98 %.  Medications Current Outpatient Medications on File Prior to Visit  Medication Sig  . aspirin 81 MG tablet Take 81 mg by mouth at bedtime.   . Biotin 10000 MCG TABS Take by mouth daily.  . cholecalciferol (VITAMIN D) 1000 units tablet Take 1,000 Units by mouth daily.  . clonazePAM (KLONOPIN) 1 MG tablet Take 1 mg by mouth 2 (two) times daily as needed for anxiety.   . dicyclomine (BENTYL) 10 MG capsule Take every 6 hours as needed for cramping abdominal pain.  Marland Kitchen diltiazem (CARDIZEM) 120 MG tablet Take 1 tablet   2 x /day for BP  . famotidine (PEPCID) 20 MG tablet Take 2 tablets (40 mg total) by mouth 2 (two) times daily.  . furosemide (LASIX) 20 MG tablet TAKE 1 TABLET BY MOUTH EVERY DAY (Patient taking differently: prn)  . gabapentin (NEURONTIN) 300 MG capsule TAKE 1 TO 2 CAPSULES BY MOUTH EVERY NIGHT 1 TO 3 HOURS BEFORE BEDTIME  . levalbuterol (XOPENEX) 1.25 MG/3ML nebulizer solution Take 1.25 mg by nebulization every 8 (eight) hours as needed for wheezing or shortness of breath.  . meloxicam (MOBIC) 15 MG tablet TAKE 1 TABLET BY MOUTH DAILY WITH FOOD FOR 2 WEEKS, THEN AS NEEDED DAILY FOR PAIN(MAY TAKE WITH TYLENOL; CAN NOT TAKE WITH ALEVE/IBUPROFEN)  . potassium chloride SA (K-DUR,KLOR-CON) 20 MEQ tablet TAKE 1 TABLET BY MOUTH TWICE DAILY (Patient taking differently: PRN)  .  pravastatin (PRAVACHOL) 40 MG tablet TAKE 1 TABLET BY MOUTH EVERY DAY  . sertraline (ZOLOFT) 100 MG tablet Take 1 tablet (100 mg total) by mouth daily.  . traZODone (DESYREL) 50 MG tablet 1/2-1 tablet for sleep   No current facility-administered medications on file prior to visit.     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   Review of Systems  Constitutional: Positive for chills. Negative for diaphoresis.  HENT: Positive for congestion, ear pain, sinus pressure and sore throat. Negative for sneezing.   Respiratory: Negative.  Negative for cough and shortness of breath.   Cardiovascular: Negative.   Musculoskeletal: Positive for neck pain.  Neurological: Negative.  Negative for headaches.       Objective:   Physical Exam  Constitutional: She appears well-developed and well-nourished.  HENT:  Head: Normocephalic and atraumatic.  Right Ear: External ear normal.  Left Ear: External ear normal.  Mouth/Throat: Uvula is midline and mucous membranes are normal. Posterior oropharyngeal edema and posterior oropharyngeal erythema present.  Eyes: Pupils are equal,  round, and reactive to light. Conjunctivae and EOM are normal.  Neck: Normal range of motion. Neck supple.  Cardiovascular: Normal rate, regular rhythm and normal heart sounds.  Pulmonary/Chest: Effort normal and breath sounds normal.  Abdominal: Soft. Bowel sounds are normal.  Lymphadenopathy:    She has cervical adenopathy.  Skin: Skin is warm and dry.          Assessment & Plan:    Sore throat -      POCT rapid strep A- negative -     predniSONE (DELTASONE) 10 MG tablet; 2 tablets daily for 3 days, 1 tablet daily for 4 days. -     amoxicillin-clavulanate (AUGMENTIN) 875-125 MG tablet; Take 1 tablet by mouth 2 (two) times daily. 7 days Discussed the importance of avoiding unnecessary antibiotic therapy. Suggested symptomatic OTC remedies. Nasal saline spray for congestion. Nasal steroids, allergy pill, oral steroids offered  Follow up as needed.   Generalized anxiety disorder -     clonazePAM (KLONOPIN) 0.5 MG tablet; Take 1 tablet (0.5 mg total) by mouth 2 (two) times daily as needed for anxiety (limit the amount, medication is addictive).

## 2018-04-26 NOTE — Patient Instructions (Addendum)
Try just the inhaler, allergy pill and prednisone for a few days, you always want to give your body 10 days to fight off infection with help before taking an anabiotic.  Antibiotics have been linked with colon infections, resistance and newest theory is colon cancer in 40-50 year olds. So it is VERY important to try to avoid them, antibiotics are NOT risk free medications.  If you are not feeling better make an office visit.    Here is more info below HOW TO TREAT VIRAL COUGH AND COLD SYMPTOMS:  -Symptoms usually last at least 1 week with the worst symptoms being around day 4.  - colds usually start with a sore throat and end with a cough, and the cough can take 2 weeks to get better.  -No antibiotics are needed for colds, flu, sore throats, cough, bronchitis UNLESS symptoms are longer than 7 days OR if you are getting better then get drastically worse.  -There are a lot of combination medications (Dayquil, Nyquil, Vicks 44, tyelnol cold and sinus, ETC). Please look at the ingredients on the back so that you are treating the correct symptoms and not doubling up on medications/ingredients.    Medicines you can use  Nasal congestion  Little Remedies saline spray (aerosol/mist)- can try this, it is in the kids section - pseudoephedrine (Sudafed)- behind the counter, do not use if you have high blood pressure, medicine that have -D in them.  - phenylephrine (Sudafed PE) -Dextormethorphan + chlorpheniramine (Coridcidin HBP)- okay if you have high blood pressure -Oxymetazoline (Afrin) nasal spray- LIMIT to 3 days -Saline nasal spray -Neti pot (used distilled or bottled water)  Ear pain/congestion  -pseudoephedrine (sudafed) - Nasonex/flonase nasal spray  Fever  -Acetaminophen (Tyelnol) -Ibuprofen (Advil, motrin, aleve)  Sore Throat  -Acetaminophen (Tyelnol) -Ibuprofen (Advil, motrin, aleve) -Drink a lot of water -Gargle with salt water - Rest your voice (don't talk) -Throat  sprays -Cough drops  Body Aches  -Acetaminophen (Tyelnol) -Ibuprofen (Advil, motrin, aleve)  Headache  -Acetaminophen (Tyelnol) -Ibuprofen (Advil, motrin, aleve) - Exedrin, Exedrin Migraine  Allergy symptoms (cough, sneeze, runny nose, itchy eyes) -Claritin or loratadine cheapest but likely the weakest  -Zyrtec or certizine at night because it can make you sleepy -The strongest is allegra or fexafinadine  Cheapest at [employer], [employer], [employer]  Cough  -Dextromethorphan (Delsym)- medicine that has DM in it -Guafenesin (Mucinex/Robitussin) - cough drops - drink lots of water  Chest Congestion  -Guafenesin (Mucinex/Robitussin)  Red Itchy Eyes  - Naphcon-A  Upset Stomach  - Bland diet (nothing spicy, greasy, fried, and high acid foods like tomatoes, oranges, berries) -OKAY- cereal, bread, soup, crackers, rice -Eat smaller more frequent meals -reduce caffeine, no alcohol -Loperamide (Imodium-AD) if diarrhea -Prevacid for heart burn  General health when sick  -Hydration -wash your hands frequently -keep surfaces clean -change pillow cases and sheets often -Get fresh air but do not exercise strenuously -Vitamin D, double up on it - Vitamin C -Zinc

## 2018-05-12 ENCOUNTER — Other Ambulatory Visit: Payer: Self-pay | Admitting: Internal Medicine

## 2018-05-29 ENCOUNTER — Other Ambulatory Visit: Payer: Self-pay

## 2018-05-29 MED ORDER — POTASSIUM CHLORIDE CRYS ER 20 MEQ PO TBCR: 20.0000 meq | EXTENDED_RELEASE_TABLET | Freq: Two times a day (BID) | ORAL | 2 refills | Status: DC

## 2018-06-06 ENCOUNTER — Encounter: Payer: Self-pay | Admitting: Adult Health

## 2018-06-06 ENCOUNTER — Ambulatory Visit (INDEPENDENT_AMBULATORY_CARE_PROVIDER_SITE_OTHER): Payer: PPO | Admitting: Adult Health

## 2018-06-06 VITALS — BP 116/70 | HR 63 | Temp 97.3°F | Ht 63.0 in | Wt 136.0 lb

## 2018-06-06 DIAGNOSIS — J302 Other seasonal allergic rhinitis: Secondary | ICD-10-CM

## 2018-06-06 MED ORDER — PREDNISONE 10 MG PO TABS: ORAL_TABLET | ORAL | 0 refills | Status: DC

## 2018-06-06 NOTE — Patient Instructions (Signed)
  Start taking mucinex/guaifenasin regularly until symptoms resolve  May need to do saline irrigations, daily allergy pill (claritin, allegra or zyrtec)  Flonase is good for ear pressure/nasal congestion once you complete prednisone     Medicines you can use  Nasal congestion  Little Remedies saline spray (aerosol/mist)- can try this, it is in the kids section - pseudoephedrine (Sudafed)- behind the counter, do not use if you have high blood pressure, medicine that have -D in them.  - phenylephrine (Sudafed PE) -Dextormethorphan + chlorpheniramine (Coridcidin HBP)- okay if you have high blood pressure -Oxymetazoline (Afrin) nasal spray- LIMIT to 3 days -Saline nasal spray -Neti pot (used distilled or bottled water)  Ear pain/congestion  -pseudoephedrine (sudafed) - Nasonex/flonase nasal spray  Fever  -Acetaminophen (Tyelnol) -Ibuprofen (Advil, motrin, aleve)  Sore Throat  -Acetaminophen (Tyelnol) -Ibuprofen (Advil, motrin, aleve) -Drink a lot of water -Gargle with salt water - Rest your voice (don't talk) -Throat sprays -Cough drops  Body Aches  -Acetaminophen (Tyelnol) -Ibuprofen (Advil, motrin, aleve)  Headache  -Acetaminophen (Tyelnol) -Ibuprofen (Advil, motrin, aleve) - Exedrin, Exedrin Migraine  Allergy symptoms (cough, sneeze, runny nose, itchy eyes) -Claritin or loratadine cheapest but likely the weakest  -Zyrtec or certizine at night because it can make you sleepy -The strongest is allegra or fexafinadine  Cheapest at [employer], [employer], [employer]  Cough  -Dextromethorphan (Delsym)- medicine that has DM in it -Guafenesin (Mucinex/Robitussin) - cough drops - drink lots of water  Chest Congestion  -Guafenesin (Mucinex/Robitussin)  Red Itchy Eyes  - Naphcon-A  Upset Stomach  - Bland diet (nothing spicy, greasy, fried, and high acid foods like tomatoes, oranges, berries) -OKAY- cereal, bread, soup, crackers, rice -Eat smaller more frequent meals -reduce  caffeine, no alcohol -Loperamide (Imodium-AD) if diarrhea -Prevacid for heart burn  General health when sick  -Hydration -wash your hands frequently -keep surfaces clean -change pillow cases and sheets often -Get fresh air but do not exercise strenuously -Vitamin D, double up on it - Vitamin C -Zinc

## 2018-06-06 NOTE — Progress Notes (Signed)
Assessment and Plan:  Barney was seen today for dizziness and headache.  Diagnoses and all orders for this visit:  Seasonal allergic rhinitis, unspecified trigger Discussed the importance of avoiding unnecessary antibiotic therapy. Suggested symptomatic OTC remedies. Nasal saline spray for congestion. Nasal steroids, allergy pill, oral steroids offered Follow up as needed. -     predniSONE (DELTASONE) 10 MG tablet; 2 tablets daily for 3 days, 1 tablet daily for 4 days.  Further disposition pending results of labs. Discussed med's effects and SE's.   Over 15 minutes of exam, counseling, chart review, and critical decision making was performed.   Future Appointments  Date Time Provider [place]  07/03/2018 10:30 AM [name], NP [facility] None    ------------------------------------------------------------------------------------------------------------------   HPI BP 116/70   Pulse 63   Temp (!) 97.3 F (36.3 C)   Ht 5\' 3"  (1.6 m)   Wt 136 lb (61.7 kg)   SpO2 98%   BMI 24.09 kg/m   67 y.o.female presents for evaluation of feeling "swimmy headed" and sinus pressure. She denies cough, sneezing, itchy/watery eyes. She does endorse some mild congestion/runny nose. She does reports history of environmental allergies. Denies fever/chills, known sick contacts, recent travel.   She has taken some tylenol for facial pressure/mild headache. She has also taken some mucinex x 2 doses but stopped "I thought I was better."   Past Medical History:  Diagnosis Date  . Anxiety and depression   . Arthritis   . Colon polyps   . GERD (gastroesophageal reflux disease)   . Hemorrhoids   . Hiatal hernia   . History of kidney stones    passed  . Hyperlipemia   . Mixed hyperlipidemia 11/26/2013  . Panic disorder   . Renal insufficiency    stage 3 kidney disease per her PCP  . Stomach ulcer   . Unspecified essential hypertension 11/26/2013     Allergies  Allergen  Reactions  . Other     All pain medications: Unknown/ CODEINE  . Prednisone Other (See Comments)    DYSPHORIA  . Vioxx [Rofecoxib]     unknown  . Entex T [Pseudoephedrine-Guaifenesin] Palpitations  . Levaquin [Levofloxacin] Rash    Tolerated Avelox without adverse effect 11/08/15.  . Sulfa Antibiotics Rash  . Trovan [Alatrofloxacin] Rash    Current Outpatient Medications on File Prior to Visit  Medication Sig  . amoxicillin-clavulanate (AUGMENTIN) 875-125 MG tablet Take 1 tablet by mouth 2 (two) times daily. 7 days  . aspirin 81 MG tablet Take 81 mg by mouth at bedtime.   . Biotin 10000 MCG TABS Take by mouth daily.  . cholecalciferol (VITAMIN D) 1000 units tablet Take 1,000 Units by mouth daily.  . clonazePAM (KLONOPIN) 0.5 MG tablet Take 1 tablet (0.5 mg total) by mouth 2 (two) times daily as needed for anxiety (limit the amount, medication is addictive).  Marland Kitchen dicyclomine (BENTYL) 10 MG capsule Take every 6 hours as needed for cramping abdominal pain.  Marland Kitchen diltiazem (CARDIZEM) 120 MG tablet TAKE 1 TABLET(120 MG) BY MOUTH TWICE DAILY  . famotidine (PEPCID) 20 MG tablet Take 2 tablets (40 mg total) by mouth 2 (two) times daily.  . furosemide (LASIX) 20 MG tablet TAKE 1 TABLET BY MOUTH EVERY DAY (Patient taking differently: prn)  . gabapentin (NEURONTIN) 300 MG capsule TAKE 1 TO 2 CAPSULES BY MOUTH EVERY NIGHT 1 TO 3 HOURS BEFORE BEDTIME  . levalbuterol (XOPENEX) 1.25 MG/3ML nebulizer solution Take 1.25 mg by nebulization every 8 (eight) hours  as needed for wheezing or shortness of breath.  . meloxicam (MOBIC) 15 MG tablet TAKE 1 TABLET BY MOUTH DAILY WITH FOOD FOR 2 WEEKS, THEN AS NEEDED DAILY FOR PAIN(MAY TAKE WITH TYLENOL; CAN NOT TAKE WITH ALEVE/IBUPROFEN)  . potassium chloride SA (K-DUR,KLOR-CON) 20 MEQ tablet Take 1 tablet (20 mEq total) by mouth 2 (two) times daily.  . pravastatin (PRAVACHOL) 40 MG tablet TAKE 1 TABLET BY MOUTH EVERY DAY  . predniSONE (DELTASONE) 10 MG tablet 2 tablets  daily for 3 days, 1 tablet daily for 4 days.  Marland Kitchen sertraline (ZOLOFT) 100 MG tablet Take 1 tablet (100 mg total) by mouth daily.  . traZODone (DESYREL) 50 MG tablet 1/2-1 tablet for sleep   No current facility-administered medications on file prior to visit.     ROS: Review of Systems  Constitutional: Negative for chills, diaphoresis, fever and malaise/fatigue.  HENT: Positive for congestion and sinus pain. Negative for ear discharge, ear pain, hearing loss, sore throat and tinnitus.   Eyes: Negative for blurred vision, pain, discharge and redness.  Respiratory: Negative for cough, hemoptysis, sputum production, shortness of breath, wheezing and stridor.   Cardiovascular: Negative for chest pain, palpitations and orthopnea.  Gastrointestinal: Negative for abdominal pain, diarrhea, nausea and vomiting.  Genitourinary: Negative.   Musculoskeletal: Negative for joint pain and myalgias.  Skin: Negative for rash.  Neurological: Negative for dizziness, sensory change, weakness and headaches.  Endo/Heme/Allergies: Positive for environmental allergies.  Psychiatric/Behavioral: Negative.   All other systems reviewed and are negative.   Physical Exam:  BP 116/70   Pulse 63   Temp (!) 97.3 F (36.3 C)   Ht 5\' 3"  (1.6 m)   Wt 136 lb (61.7 kg)   SpO2 98%   BMI 24.09 kg/m   General Appearance: Well nourished, in no apparent distress. Eyes: PERRLA, EOMs, conjunctiva no swelling or erythema Sinuses: No Frontal tenderness, she does have bilateral mild maxillary tenderness ENT/Mouth: Ext aud canals clear, TMs without erythema, bulging. No erythema, swelling, or exudate on post pharynx.  Tonsils not swollen or erythematous. Hearing normal.  Neck: Supple, thyroid normal.  Respiratory: Respiratory effort normal, BS equal bilaterally without rales, rhonchi, wheezing or stridor.  Cardio: RRR with no MRGs. Brisk peripheral pulses without edema.  Abdomen: Soft, + BS.  Non tender, no guarding, rebound,  hernias, masses. Lymphatics: Non tender without lymphadenopathy.  Musculoskeletal: 5/5 strength, normal gait.  Skin: Warm, dry without rashes, lesions, ecchymosis.  Neuro: Cranial nerves intact. Normal muscle tone, no cerebellar symptoms. Sensation intact.  Psych: Awake and oriented X 3, normal affect, Insight and Judgment appropriate.     Izora Ribas, NP 3:47 PM [facility]

## 2018-06-11 ENCOUNTER — Telehealth: Payer: Self-pay

## 2018-06-11 NOTE — Telephone Encounter (Signed)
Patient states that she has a headache, feels jittery and nervous, has discomfort in her chest every so often. Wants to know if this could be related to the Prednisone?

## 2018-06-12 ENCOUNTER — Encounter: Payer: Self-pay | Admitting: Adult Health

## 2018-06-12 ENCOUNTER — Ambulatory Visit (INDEPENDENT_AMBULATORY_CARE_PROVIDER_SITE_OTHER): Payer: PPO | Admitting: Adult Health

## 2018-06-12 VITALS — BP 130/80 | HR 64 | Temp 97.3°F | Ht 63.0 in | Wt 137.0 lb

## 2018-06-12 DIAGNOSIS — F419 Anxiety disorder, unspecified: Secondary | ICD-10-CM

## 2018-06-12 DIAGNOSIS — R079 Chest pain, unspecified: Secondary | ICD-10-CM

## 2018-06-12 DIAGNOSIS — R0600 Dyspnea, unspecified: Secondary | ICD-10-CM | POA: Diagnosis not present

## 2018-06-12 DIAGNOSIS — R450 Nervousness: Secondary | ICD-10-CM

## 2018-06-12 LAB — CBC WITH DIFFERENTIAL/PLATELET
Basophils Absolute: 43 cells/uL (ref 0–200)
Basophils Relative: 0.6 %
Eosinophils Absolute: 122 cells/uL (ref 15–500)
Eosinophils Relative: 1.7 %
HCT: 40.8 % (ref 35.0–45.0)
Hemoglobin: 13.7 g/dL (ref 11.7–15.5)
Lymphs Abs: 2131 cells/uL (ref 850–3900)
MCH: 30.2 pg (ref 27.0–33.0)
MCHC: 33.6 g/dL (ref 32.0–36.0)
MCV: 89.9 fL (ref 80.0–100.0)
MPV: 10 fL (ref 7.5–12.5)
Monocytes Relative: 10.3 %
Neutro Abs: 4162 cells/uL (ref 1500–7800)
Neutrophils Relative %: 57.8 %
Platelets: 208 10*3/uL (ref 140–400)
RBC: 4.54 10*6/uL (ref 3.80–5.10)
RDW: 12.5 % (ref 11.0–15.0)
Total Lymphocyte: 29.6 %
WBC mixed population: 742 cells/uL (ref 200–950)
WBC: 7.2 10*3/uL (ref 3.8–10.8)

## 2018-06-12 LAB — COMPLETE METABOLIC PANEL WITH GFR
AG Ratio: 1.7 (calc) (ref 1.0–2.5)
ALT: 17 U/L (ref 6–29)
AST: 28 U/L (ref 10–35)
Albumin: 4 g/dL (ref 3.6–5.1)
Alkaline phosphatase (APISO): 87 U/L (ref 33–130)
BUN/Creatinine Ratio: 19 (calc) (ref 6–22)
BUN: 20 mg/dL (ref 7–25)
CO2: 29 mmol/L (ref 20–32)
Calcium: 9.4 mg/dL (ref 8.6–10.4)
Chloride: 103 mmol/L (ref 98–110)
Creat: 1.08 mg/dL — ABNORMAL HIGH (ref 0.50–0.99)
GFR, Est African American: 61 mL/min/{1.73_m2} (ref >=60)
GFR, Est Non African American: 53 mL/min/{1.73_m2} — ABNORMAL LOW (ref >=60)
Globulin: 2.3 g/dL (calc) (ref 1.9–3.7)
Glucose, Bld: 75 mg/dL (ref 65–99)
Potassium: 3.7 mmol/L (ref 3.5–5.3)
Sodium: 138 mmol/L (ref 135–146)
Total Bilirubin: 0.3 mg/dL (ref 0.2–1.2)
Total Protein: 6.3 g/dL (ref 6.1–8.1)

## 2018-06-12 LAB — TROPONIN I: Troponin I: <0.01 ng/mL (ref <=0.0)

## 2018-06-12 NOTE — Patient Instructions (Signed)
I think this is probably prednisone + anxiety, but will check your heart  Reduce stress/responsibilities, use klonopin if needed, STOP prednisone  Go to the ER if any chest pain, shortness of breath, nausea, dizziness, severe HA, changes vision/speech    Nonspecific Chest Pain Chest pain can be caused by many different conditions. There is always a chance that your pain could be related to something serious, such as a heart attack or a blood clot in your lungs. Chest pain can also be caused by conditions that are not life-threatening. If you have chest pain, it is very important to follow up with your health care provider. What are the causes? Causes of this condition include:  Heartburn.  Pneumonia or bronchitis.  Anxiety or stress.  Inflammation around your heart (pericarditis) or lung (pleuritis or pleurisy).  A blood clot in your lung.  A collapsed lung (pneumothorax). This can develop suddenly on its own (spontaneous pneumothorax) or from trauma to the chest.  Shingles infection (varicella-zoster virus).  Heart attack.  Damage to the bones, muscles, and cartilage that make up your chest wall. This can include: ? Bruised bones due to injury. ? Strained muscles or cartilage due to frequent or repeated coughing or overwork. ? Fracture to one or more ribs. ? Sore cartilage due to inflammation (costochondritis).  What increases the risk? Risk factors for this condition may include:  Activities that increase your risk for trauma or injury to your chest.  Respiratory infections or conditions that cause frequent coughing.  Medical conditions or overeating that can cause heartburn.  Heart disease or family history of heart disease.  Conditions or health behaviors that increase your risk of developing a blood clot.  Having had chicken pox (varicella zoster).  What are the signs or symptoms? Chest pain can feel like:  Burning or tingling on the surface of your chest or  deep in your chest.  Crushing, pressure, aching, or squeezing pain.  Dull or sharp pain that is worse when you move, cough, or take a deep breath.  Pain that is also felt in your back, neck, shoulder, or arm, or pain that spreads to any of these areas.  Your chest pain may come and go, or it may stay constant. How is this diagnosed? Lab tests or other studies may be needed to find the cause of your pain. Your health care provider may have you take a test called an ECG (electrocardiogram). An ECG records your heartbeat patterns at the time the test is performed. You may also have other tests, such as:  Transthoracic echocardiogram (TTE). In this test, sound waves are used to create a picture of the heart structures and to look at how blood flows through your heart.  Transesophageal echocardiogram (TEE).This is a more advanced imaging test that takes images from inside your body. It allows your health care provider to see your heart in finer detail.  Cardiac monitoring. This allows your health care provider to monitor your heart rate and rhythm in real time.  Holter monitor. This is a portable device that records your heartbeat and can help to diagnose abnormal heartbeats. It allows your health care provider to track your heart activity for several days, if needed.  Stress tests. These can be done through exercise or by taking medicine that makes your heart beat more quickly.  Blood tests.  Other imaging tests.  How is this treated? Treatment depends on what is causing your chest pain. Treatment may include:  Medicines. These may  include: ? Acid blockers for heartburn. ? Anti-inflammatory medicine. ? Pain medicine for inflammatory conditions. ? Antibiotic medicine, if an infection is present. ? Medicines to dissolve blood clots. ? Medicines to treat coronary artery disease (CAD).  Supportive care for conditions that do not require medicines. This may  include: ? Resting. ? Applying heat or cold packs to injured areas. ? Limiting activities until pain decreases.  Follow these instructions at home: Medicines  If you were prescribed an antibiotic, take it as told by your health care provider. Do not stop taking the antibiotic even if you start to feel better.  Take over-the-counter and prescription medicines only as told by your health care provider. Lifestyle  Do not use any products that contain nicotine or tobacco, such as cigarettes and e-cigarettes. If you need help quitting, ask your health care provider.  Do not drink alcohol.  Make lifestyle changes as directed by your health care provider. These may include: ? Getting regular exercise. Ask your health care provider to suggest some activities that are safe for you. ? Eating a heart-healthy diet. A registered dietitian can help you to learn healthy eating options. ? Maintaining a healthy weight. ? Managing diabetes, if necessary. ? Reducing stress, such as with yoga or relaxation techniques. General instructions  Avoid any activities that bring on chest pain.  If heartburn is the cause for your chest pain, raise (elevate) the head of your bed about 6 inches (15 cm) by putting blocks under the legs. Sleeping with more pillows does not effectively relieve heartburn because it only changes the position of your head.  Keep all follow-up visits as told by your health care provider. This is important. This includes any further testing if your chest pain does not go away. Contact a health care provider if:  Your chest pain does not go away.  You have a rash with blisters on your chest.  You have a fever.  You have chills. Get help right away if:  Your chest pain is worse.  You have a cough that gets worse, or you cough up blood.  You have severe pain in your abdomen.  You have severe weakness.  You faint.  You have sudden, unexplained chest discomfort.  You have  sudden, unexplained discomfort in your arms, back, neck, or jaw.  You have shortness of breath at any time.  You suddenly start to sweat, or your skin gets clammy.  You feel nauseous or you vomit.  You suddenly feel light-headed or dizzy.  Your heart begins to beat quickly, or it feels like it is skipping beats. These symptoms may represent a serious problem that is an emergency. Do not wait to see if the symptoms will go away. Get medical help right away. Call your local emergency services (911 in the [place] Do not drive yourself to the hospital. This information is not intended to replace advice given to you by your health care provider. Make sure you discuss any questions you have with your health care provider. Document Released: 05/31/2005 Document Revised: 05/15/2016 Document Reviewed: 05/15/2016 Elsevier Interactive Patient Education  2017 [employer].

## 2018-06-12 NOTE — Telephone Encounter (Signed)
Patient is scheduled for OV today

## 2018-06-12 NOTE — Telephone Encounter (Signed)
Patient states that she feels that she is just doing to much for to many people, she is helping with taking care of her husband's cousin who has MS and Bell's Palsy. Feels that she has anxiety and gets chest pain on exertion. Informed patient that she will need to be seen here or go to the ED if her chest pain continues.

## 2018-06-12 NOTE — Progress Notes (Signed)
Assessment and Plan:  Latoya [name] was seen today for chest pain.  Diagnoses and all orders for this visit:  Exertional chest pain/Dyspnea/jittery/anxiety Suspect anxiety exacerbated by prednisone but will r/o acute cardiac origin Stop prednisone, avoid in future Stress reduction discussed, reduce responsibilities  Strong ER precautions given for tonight -     CBC with Differential/Platelet -     COMPLETE METABOLIC PANEL WITH GFR -     EKG 12-Lead -  -     Troponin I  Further disposition pending results of labs. Discussed med's effects and SE's.   Over 15 minutes of exam, counseling, chart review, and critical decision making was performed.   Future Appointments  Date Time Provider [place]  07/03/2018 10:30 AM [name], NP [facility] None    ------------------------------------------------------------------------------------------------------------------   HPI BP 130/80   Pulse 64   Temp (!) 97.3 F (36.3 C)   Ht 5\' 3"  (1.6 m)   Wt 137 lb (62.1 kg)   SpO2 99%   BMI 24.27 kg/m   67 y.o.female presents for evaluation of generalized chest pain/tightness, at rest but worse with exertion, shortness of breath and feeling jittery. She reports this began after she increased her dose of prednisone which was prescribed for flare of allergic rhinitis last week on 06/06/2018; started feeling jittery, short of breath, anxious, chest pain intermittently, and swimmy headed, dizzy the next day but kept taking prednisone, then stopped prednisone 3 days ago, reports not feeling as bad but still feeling rather jittery. She has hx of anxiety and is prescribed klonopin PRN; reports she took one tab on Friday and felt much better. She is trying to limit use due to cautions regarding addiction potential. She reports "I'm doing everything at home." "there's just a lot going on" - she feels she is constantly running and under a lot of stress. Her husband has severe lung disease and is very  limited, and also other family members and church responsibilities. She is on zoloft 100 mg and trazodone at night.   She has reported similar symptoms earlier this year and was referred to Dr. Stanford Breed for evaluation of atypical chest pain - exercise stress test was ordered but subsequently canceled. ECHO was completed with essentially normal results.   Past Medical History:  Diagnosis Date  . Anxiety and depression   . Arthritis   . Colon polyps   . GERD (gastroesophageal reflux disease)   . Hemorrhoids   . Hiatal hernia   . History of kidney stones    passed  . Hyperlipemia   . Mixed hyperlipidemia 11/26/2013  . Panic disorder   . Renal insufficiency    stage 3 kidney disease per her PCP  . Stomach ulcer   . Unspecified essential hypertension 11/26/2013     Allergies  Allergen Reactions  . Other     All pain medications: Unknown/ CODEINE  . Prednisone Other (See Comments)    DYSPHORIA  . Vioxx [Rofecoxib]     unknown  . Entex T [Pseudoephedrine-Guaifenesin] Palpitations  . Levaquin [Levofloxacin] Rash    Tolerated Avelox without adverse effect 11/08/15.  . Sulfa Antibiotics Rash  . Trovan [Alatrofloxacin] Rash    Current Outpatient Medications on File Prior to Visit  Medication Sig  . aspirin 81 MG tablet Take 81 mg by mouth at bedtime.   . Biotin 10000 MCG TABS Take by mouth daily.  . cholecalciferol (VITAMIN D) 1000 units tablet Take 1,000 Units by mouth daily.  . clonazePAM (KLONOPIN) 0.5 MG  tablet Take 1 tablet (0.5 mg total) by mouth 2 (two) times daily as needed for anxiety (limit the amount, medication is addictive).  Marland Kitchen dicyclomine (BENTYL) 10 MG capsule Take every 6 hours as needed for cramping abdominal pain.  Marland Kitchen diltiazem (CARDIZEM) 120 MG tablet TAKE 1 TABLET(120 MG) BY MOUTH TWICE DAILY  . famotidine (PEPCID) 20 MG tablet Take 2 tablets (40 mg total) by mouth 2 (two) times daily.  . furosemide (LASIX) 20 MG tablet TAKE 1 TABLET BY MOUTH EVERY DAY (Patient  taking differently: prn)  . gabapentin (NEURONTIN) 300 MG capsule TAKE 1 TO 2 CAPSULES BY MOUTH EVERY NIGHT 1 TO 3 HOURS BEFORE BEDTIME  . levalbuterol (XOPENEX) 1.25 MG/3ML nebulizer solution Take 1.25 mg by nebulization every 8 (eight) hours as needed for wheezing or shortness of breath.  . meloxicam (MOBIC) 15 MG tablet TAKE 1 TABLET BY MOUTH DAILY WITH FOOD FOR 2 WEEKS, THEN AS NEEDED DAILY FOR PAIN(MAY TAKE WITH TYLENOL; CAN NOT TAKE WITH ALEVE/IBUPROFEN)  . potassium chloride SA (K-DUR,KLOR-CON) 20 MEQ tablet Take 1 tablet (20 mEq total) by mouth 2 (two) times daily.  . pravastatin (PRAVACHOL) 40 MG tablet TAKE 1 TABLET BY MOUTH EVERY DAY  . predniSONE (DELTASONE) 10 MG tablet 2 tablets daily for 3 days, 1 tablet daily for 4 days.  Marland Kitchen sertraline (ZOLOFT) 100 MG tablet Take 1 tablet (100 mg total) by mouth daily.  . traZODone (DESYREL) 50 MG tablet 1/2-1 tablet for sleep   No current facility-administered medications on file prior to visit.     ROS: all negative except above.   Physical Exam:  BP 130/80   Pulse 64   Temp (!) 97.3 F (36.3 C)   Ht 5\' 3"  (1.6 m)   Wt 137 lb (62.1 kg)   SpO2 99%   BMI 24.27 kg/m   General Appearance: Well nourished, in no apparent/acute distress. Eyes: PERRLA, EOMs, conjunctiva no swelling or erythema Sinuses: No Frontal/maxillary tenderness ENT/Mouth: Ext aud canals clear, TMs without erythema, bulging. No erythema, swelling, or exudate on post pharynx.  Tonsils not swollen or erythematous. Hearing normal.  Neck: Supple, thyroid normal.  Respiratory: Respiratory effort normal, BS equal bilaterally without rales, rhonchi, wheezing or stridor.  Cardio: RRR with no MRGs. Brisk peripheral pulses without edema.  Abdomen: Soft, + BS.  Non tender, no guarding. Lymphatics: Non tender without lymphadenopathy.  Musculoskeletal: normal gait.  Skin: Warm, dry without rashes, lesions, ecchymosis.  Neuro: Cranial nerves intact. Normal muscle tone, no  cerebellar symptoms. Sensation intact.  Psych: Awake and oriented X 3, anxious affect, Insight and Judgment appropriate.     Izora Ribas, NP 4:12 PM [facility]

## 2018-06-27 NOTE — Progress Notes (Signed)
Assessment and Plan:  Latoya [name] was seen today for flank pain.  Diagnoses and all orders for this visit:  Generalized abdominal tenderness without rebound tenderness Mild symptoms, improving; r/o UTI due to dysuria, has GERD with hx of mild ulcer/esophagitis in 10/2017 and has been off of PPI; she will get back on OTC reflux medication daily, add carafate if needed over the weekend, if not improving will check h. Pylori and add PPI if not resolved after the weekend. If not improving will pursue imaging or referral back to Dr. Carlean Purl.   -     CBC with Differential/Platelet -     COMPLETE METABOLIC PANEL WITH GFR -     Urinalysis w microscopic + reflex cultur -     sucralfate (CARAFATE) 1 g tablet; Take 1 tablet (1 g total) by mouth 4 (four) times daily -  with meals and at bedtime.  Further disposition pending results of labs. Discussed med's effects and SE's.   Over 15 minutes of exam, counseling, chart review, and critical decision making was performed.   Future Appointments  Date Time Provider [place]  07/03/2018 10:45 AM [name], PA-C [facility] None    ------------------------------------------------------------------------------------------------------------------   HPI BP 132/82   Pulse 74   Temp 97.7 F (36.5 C)   Ht 5\' 3"  (1.6 m)   Wt 136 lb 9.6 oz (62 kg)   SpO2 98%   BMI 24.20 kg/m   67 y.o.female with hx of GERD, renal calculi,hiatal hernia anxiety/depression presents for to discuss a severe episode of generalized upper abdominal cramp 3 days ago; she reports this started in the mid morning, she did have her usual light breakfast of yogurt. She reports cramp was continuous, "all across my stomach real bad," desribes as 10/10, denies radiating or shooting pain. She denies nausea, vomiting, reflux symptoms, chest pressure, pain, dyspnea, fever/chills, changes in bowel charater or patterns, hematochezia, melena, urine character changes, vaginal discharge, or  rash. She does later share she has had some dysuria without frequency, urgency or other. She reports she lay down to rest and this significantly improved symptoms, but then would recur when standing back up. She reports symptoms continued through the night, but resolved yesterday evening. Today she reports mild epigastric burning only. Feels much better.   She does take pepcid as needed when she has typical reflux symptoms, hasn't tried this or any other interventions.   She has had an appendectomy, oophorectomy on R with abdominal hysterectomy.  She had normal colonoscopy in 2014 with 10 year follow up recommended, recent EGD in 10/2017 with Dr. Carlean Purl which showed moderately tortuous esophagus which was dilated, noted mild esophagitis, small non-bleeding erosion in gastric body without stigmata of recent bleeding, 1-2 cm hiatal hernia.    Past Medical History:  Diagnosis Date  . Anxiety and depression   . Arthritis   . Colon polyps   . GERD (gastroesophageal reflux disease)   . Hemorrhoids   . Hiatal hernia   . History of kidney stones    passed  . Hyperlipemia   . Mixed hyperlipidemia 11/26/2013  . Panic disorder   . Renal insufficiency    stage 3 kidney disease per her PCP  . Stomach ulcer   . Unspecified essential hypertension 11/26/2013     Allergies  Allergen Reactions  . Other     All pain medications: Unknown/ CODEINE  . Prednisone Other (See Comments)    DYSPHORIA  . Vioxx [Rofecoxib]     unknown  .  Entex T [Pseudoephedrine-Guaifenesin] Palpitations  . Levaquin [Levofloxacin] Rash    Tolerated Avelox without adverse effect 11/08/15.  . Sulfa Antibiotics Rash  . Trovan [Alatrofloxacin] Rash    Current Outpatient Medications on File Prior to Visit  Medication Sig  . aspirin 81 MG tablet Take 81 mg by mouth at bedtime.   . Biotin 10000 MCG TABS Take by mouth daily.  . cholecalciferol (VITAMIN D) 1000 units tablet Take 1,000 Units by mouth daily.  . clonazePAM  (KLONOPIN) 0.5 MG tablet Take 1 tablet (0.5 mg total) by mouth 2 (two) times daily as needed for anxiety (limit the amount, medication is addictive).  Marland Kitchen dicyclomine (BENTYL) 10 MG capsule Take every 6 hours as needed for cramping abdominal pain.  Marland Kitchen diltiazem (CARDIZEM) 120 MG tablet TAKE 1 TABLET(120 MG) BY MOUTH TWICE DAILY  . famotidine (PEPCID) 20 MG tablet Take 2 tablets (40 mg total) by mouth 2 (two) times daily.  . furosemide (LASIX) 20 MG tablet TAKE 1 TABLET BY MOUTH EVERY DAY (Patient taking differently: prn)  . gabapentin (NEURONTIN) 300 MG capsule TAKE 1 TO 2 CAPSULES BY MOUTH EVERY NIGHT 1 TO 3 HOURS BEFORE BEDTIME  . levalbuterol (XOPENEX) 1.25 MG/3ML nebulizer solution Take 1.25 mg by nebulization every 8 (eight) hours as needed for wheezing or shortness of breath.  . meloxicam (MOBIC) 15 MG tablet TAKE 1 TABLET BY MOUTH DAILY WITH FOOD FOR 2 WEEKS, THEN AS NEEDED DAILY FOR PAIN(MAY TAKE WITH TYLENOL; CAN NOT TAKE WITH ALEVE/IBUPROFEN)  . potassium chloride SA (K-DUR,KLOR-CON) 20 MEQ tablet Take 1 tablet (20 mEq total) by mouth 2 (two) times daily.  . pravastatin (PRAVACHOL) 40 MG tablet TAKE 1 TABLET BY MOUTH EVERY DAY  . predniSONE (DELTASONE) 10 MG tablet 2 tablets daily for 3 days, 1 tablet daily for 4 days.  Marland Kitchen sertraline (ZOLOFT) 100 MG tablet Take 1 tablet (100 mg total) by mouth daily.  . traZODone (DESYREL) 50 MG tablet 1/2-1 tablet for sleep   No current facility-administered medications on file prior to visit.     ROS: all negative except above.   Physical Exam:  BP 132/82   Pulse 74   Temp 97.7 F (36.5 C)   Ht 5\' 3"  (1.6 m)   Wt 136 lb 9.6 oz (62 kg)   SpO2 98%   BMI 24.20 kg/m   General Appearance: Well nourished, in no apparent distress. Eyes: conjunctiva no swelling or erythema ENT/Mouth: No erythema, swelling, or exudate on post pharynx.  Tonsils not swollen or erythematous. Hearing normal.  Neck: Supple, thyroid normal.  Respiratory: Respiratory  effort normal, BS equal bilaterally without rales, rhonchi, wheezing or stridor.  Cardio: RRR with no MRGs. Brisk peripheral pulses without edema.  Abdomen: Soft, + BS.  Mildly tender to epigastric area, bilateral lower quadrants, no guarding, rebound, hernias, masses.  Lymphatics: Non tender without lymphadenopathy.  Musculoskeletal: normal gait. No CVA tenderness.  Skin: Warm, dry without rashes, lesions, ecchymosis.  Neuro: Cranial nerves intact. Normal muscle tone Psych: Awake and oriented X 3, normal affect, Insight and Judgment appropriate.     Izora Ribas, NP 10:45 AM [facility]

## 2018-06-28 ENCOUNTER — Encounter: Payer: Self-pay | Admitting: Adult Health

## 2018-06-28 ENCOUNTER — Ambulatory Visit (INDEPENDENT_AMBULATORY_CARE_PROVIDER_SITE_OTHER): Payer: PPO | Admitting: Adult Health

## 2018-06-28 VITALS — BP 132/82 | HR 74 | Temp 97.7°F | Ht 63.0 in | Wt 136.6 lb

## 2018-06-28 DIAGNOSIS — R10817 Generalized abdominal tenderness: Secondary | ICD-10-CM

## 2018-06-28 MED ORDER — SUCRALFATE 1 G PO TABS: 1.0000 g | ORAL_TABLET | Freq: Three times a day (TID) | ORAL | 1 refills | Status: DC

## 2018-06-28 NOTE — Patient Instructions (Signed)
Get on acid reflux medication daily x 2 weeks then as needed  Can fill carafate and take this if needed - can take as tablet, or dissolve in a 2-3 ounces of water and drink to coat your esophagus  Let me know if not getting better, or if you have any nausea, vomiting, blood in stool or very dark/black stools   Abdominal Pain, Adult Abdominal pain can be caused by many things. Often, abdominal pain is not serious and it gets better with no treatment or by being treated at home. However, sometimes abdominal pain is serious. Your health care provider will do a medical history and a physical exam to try to determine the cause of your abdominal pain. Follow these instructions at home:  Take over-the-counter and prescription medicines only as told by your health care provider. Do not take a laxative unless told by your health care provider.  Drink enough fluid to keep your urine clear or pale yellow.  Watch your condition for any changes.  Keep all follow-up visits as told by your health care provider. This is important. Contact a health care provider if:  Your abdominal pain changes or gets worse.  You are not hungry or you lose weight without trying.  You are constipated or have diarrhea for more than 2-3 days.  You have pain when you urinate or have a bowel movement.  Your abdominal pain wakes you up at night.  Your pain gets worse with meals, after eating, or with certain foods.  You are throwing up and cannot keep anything down.  You have a fever. Get help right away if:  Your pain does not go away as soon as your health care provider told you to expect.  You cannot stop throwing up.  Your pain is only in areas of the abdomen, such as the right side or the left lower portion of the abdomen.  You have bloody or black stools, or stools that look like tar.  You have severe pain, cramping, or bloating in your abdomen.  You have signs of dehydration, such as: ? Dark urine,  very little urine, or no urine. ? Cracked lips. ? Dry mouth. ? Sunken eyes. ? Sleepiness. ? Weakness. This information is not intended to replace advice given to you by your health care provider. Make sure you discuss any questions you have with your health care provider. Document Released: 05/31/2005 Document Revised: 03/10/2016 Document Reviewed: 02/02/2016 Elsevier Interactive Patient Education  [employer].

## 2018-06-29 ENCOUNTER — Other Ambulatory Visit: Payer: Self-pay | Admitting: Adult Health

## 2018-06-29 DIAGNOSIS — F411 Generalized anxiety disorder: Secondary | ICD-10-CM

## 2018-06-30 LAB — CBC WITH DIFFERENTIAL/PLATELET
Basophils Absolute: 39 cells/uL (ref 0–200)
Basophils Relative: 0.6 %
Eosinophils Absolute: 150 cells/uL (ref 15–500)
Eosinophils Relative: 2.3 %
HCT: 43.7 % (ref 35.0–45.0)
Hemoglobin: 14.5 g/dL (ref 11.7–15.5)
Lymphs Abs: 1645 cells/uL (ref 850–3900)
MCH: 29.8 pg (ref 27.0–33.0)
MCHC: 33.2 g/dL (ref 32.0–36.0)
MCV: 89.9 fL (ref 80.0–100.0)
MPV: 10.4 fL (ref 7.5–12.5)
Monocytes Relative: 9.8 %
Neutro Abs: 4030 cells/uL (ref 1500–7800)
Neutrophils Relative %: 62 %
Platelets: 204 10*3/uL (ref 140–400)
RBC: 4.86 10*6/uL (ref 3.80–5.10)
RDW: 12.4 % (ref 11.0–15.0)
Total Lymphocyte: 25.3 %
WBC mixed population: 637 cells/uL (ref 200–950)
WBC: 6.5 10*3/uL (ref 3.8–10.8)

## 2018-06-30 LAB — URINALYSIS W MICROSCOPIC + REFLEX CULTURE
Bacteria, UA: NONE SEEN /HPF
Bilirubin Urine: NEGATIVE
Glucose, UA: NEGATIVE
Hgb urine dipstick: NEGATIVE
Hyaline Cast: NONE SEEN /LPF
Ketones, ur: NEGATIVE
Nitrites, Initial: NEGATIVE
Protein, ur: NEGATIVE
Specific Gravity, Urine: 1.021 (ref 1.001–1.03)
pH: 6.5 (ref 5.0–8.0)

## 2018-06-30 LAB — COMPLETE METABOLIC PANEL WITH GFR
AG Ratio: 1.9 (calc) (ref 1.0–2.5)
ALT: 15 U/L (ref 6–29)
AST: 23 U/L (ref 10–35)
Albumin: 4.3 g/dL (ref 3.6–5.1)
Alkaline phosphatase (APISO): 94 U/L (ref 33–130)
BUN/Creatinine Ratio: 10 (calc) (ref 6–22)
BUN: 11 mg/dL (ref 7–25)
CO2: 27 mmol/L (ref 20–32)
Calcium: 9.3 mg/dL (ref 8.6–10.4)
Chloride: 109 mmol/L (ref 98–110)
Creat: 1.14 mg/dL — ABNORMAL HIGH (ref 0.50–0.99)
GFR, Est African American: 57 mL/min/{1.73_m2} — ABNORMAL LOW (ref >=60)
GFR, Est Non African American: 49 mL/min/{1.73_m2} — ABNORMAL LOW (ref >=60)
Globulin: 2.3 g/dL (calc) (ref 1.9–3.7)
Glucose, Bld: 44 mg/dL — ABNORMAL LOW (ref 65–99)
Potassium: 4.3 mmol/L (ref 3.5–5.3)
Sodium: 144 mmol/L (ref 135–146)
Total Bilirubin: 0.3 mg/dL (ref 0.2–1.2)
Total Protein: 6.6 g/dL (ref 6.1–8.1)

## 2018-06-30 LAB — URINE CULTURE
MICRO NUMBER:: 91291437
SPECIMEN QUALITY:: ADEQUATE

## 2018-06-30 LAB — CULTURE INDICATED

## 2018-07-01 NOTE — Progress Notes (Signed)
3 month follow up Assessment and Plan:  Essential hypertension - continue medications, DASH diet, exercise and monitor at home. Call if greater than 130/80.  - Defer EKG to cardiology office, just had 10/2017 -     CBC with Differential/Platelet -     CMP/GFR -     TSH  Aortic atherosclerosis ([place]) Control blood pressure, cholesterol, glucose, increase exercise.   CKD (chronic kidney disease) stage 3, GFR 30-59 ml/min Increase fluids, avoid NSAIDS, monitor sugars, will monitor       -     CMP/GFR  Mixed hyperlipidemia -continue medications, check lipids, decrease fatty foods, increase activity.  -     Lipid panel  Other abnormal glucose Discussed general issues about diabetes pathophysiology and management., Educational material distributed., Suggested low cholesterol diet., Encouraged aerobic exercise., Discussed foot care., Reminded to get yearly retinal exam.       -     A1C  Medication management -     Magnesium  Gastroesophageal reflux disease, esophagitis presence not specified Continue PPI/H2 blocker, diet discussed  Generalized anxiety disorder continue medications, stress management techniques discussed, increase water, good sleep hygiene discussed, increase exercise, and increase veggies.   Vitamin D deficiency Continue supplementation Check vitamin D level  Left chest wall/abdominal pain Vague, non-specific symptoms and exam Will get CXR due to tenderness and reports of fall from ladder Consider abd [place] for vague lower abdominal pain if not improving If inconclusive will have follow up with Dr. Carlean Purl  Over 40 minutes of exam, counseling, chart review and critical decision making was performed Future Appointments  Date Time Provider [place]  10/03/2018 11:00 AM [name], NP [facility] None     Subjective:  Latoya [name] is a 67 y.o. 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 presents for follow up.   She had recent normal ECHO and eval by Dr. Stanford Breed in 10/2017.   She has ongoing upper abdominal pains in the evening, "all across my stomach at the top" that "nearly doubles me over." She was seen for this on 06/28/2018, unremarkable labs. She does have recent hx of esophagitis/ulcer in 10/2017 and established with Dr. Carlean Purl. She did not fill/try carafate which was prescribed. Today she reports pains are more LUQ/chest wall/flank in origin, is tender over lower ribs, and has some radiation across lower abdomen. Denies constipation  She is on zoloft and trazodone , and on klonopin rarely for anxiety/depression. She is somewhat stressed as her husband has respiratory failure and she does the majority of work around the house.   BMI is Body mass index is 24.37 kg/m., she has been working on diet and exercise. Wt Readings from Last 3 Encounters:  07/03/18 137 lb 9.6 oz (62.4 kg)  06/28/18 136 lb 9.6 oz (62 kg)  06/12/18 137 lb (62.1 kg)   Her blood pressure has been controlled at home, today their BP is BP: 122/74  She does workout, walking, yard work, house work. She denies chest pain, shortness of breath, dizziness.   She is on cholesterol medication (pravastatin 40 mg daily) and denies myalgias. Her cholesterol is at goal. The cholesterol last visit was:   Lab Results  Component Value Date  CHOL 167 04/01/2018   HDL 49 (L) 04/01/2018   LDLCALC 92 04/01/2018   TRIG 165 (H) 04/01/2018   CHOLHDL 3.4 04/01/2018    She has been working on diet and exercise for prediabetes, and denies paresthesia of the feet,  polydipsia, polyuria and visual disturbances. Last A1C in the office was:  Lab Results  Component Value Date   HGBA1C 5.7 (H) 04/01/2018   Last GFR: Lab Results  Component Value Date   GFRNONAA 49 (L) 06/28/2018   Patient is on Vitamin D supplement.   Lab Results  Component Value Date   VD25OH 62 04/01/2018       Medication Review: Current Outpatient Medications on File Prior to Visit  Medication Sig  . aspirin 81 MG tablet Take 81 mg by mouth at bedtime.   . Biotin 10000 MCG TABS Take by mouth daily.  . cholecalciferol (VITAMIN D) 1000 units tablet Take 1,000 Units by mouth daily.  . clonazePAM (KLONOPIN) 0.5 MG tablet Take 1 tablet (0.5 mg total) by mouth 2 (two) times daily as needed for anxiety (limit the amount, medication is addictive).  Marland Kitchen diltiazem (CARDIZEM) 120 MG tablet TAKE 1 TABLET(120 MG) BY MOUTH TWICE DAILY  . famotidine (PEPCID) 20 MG tablet Take 2 tablets (40 mg total) by mouth 2 (two) times daily.  Marland Kitchen levalbuterol (XOPENEX) 1.25 MG/3ML nebulizer solution Take 1.25 mg by nebulization every 8 (eight) hours as needed for wheezing or shortness of breath.  . pravastatin (PRAVACHOL) 40 MG tablet TAKE 1 TABLET BY MOUTH EVERY DAY  . sertraline (ZOLOFT) 100 MG tablet TAKE 1 TABLET(100 MG) BY MOUTH DAILY   No current facility-administered medications on file prior to visit.      Allergies  Allergen Reactions  . Other     All pain medications: Unknown/ CODEINE  . Prednisone Other (See Comments)    DYSPHORIA  . Vioxx [Rofecoxib]     unknown  . Entex T [Pseudoephedrine-Guaifenesin] Palpitations  . Levaquin [Levofloxacin] Rash    Tolerated Avelox without adverse effect 11/08/15.  . Sulfa Antibiotics Rash  . Trovan [Alatrofloxacin] Rash    Current Problems (verified) Patient Active Problem List   Diagnosis Date Noted  . FHx: heart disease 11/27/2017  . Seborrheic keratosis 06/20/2017  . 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 Laryngopharyngeal reflux  (LPR) 10/12/2016  . Chronic rhinitis 10/09/2016  . Hiatal hernia 07/14/2016  . Aortic atherosclerosis ([place]) 06/30/2016  . CKD (chronic kidney disease) stage 3, GFR 30-59 ml/min (HCC) 06/30/2016  . Thyroid nodule 12/24/2015  . Abnormal glucose 05/01/2014  . Vitamin D deficiency 05/01/2014  . Medication management 05/01/2014  . Essential hypertension 11/26/2013  . Mixed hyperlipidemia 11/26/2013  . Chronic cough 09/19/2013  . Generalized anxiety disorder 07/11/2013  . Esophageal reflux 11/29/2012    SURGICAL HISTORY She  has a past surgical history that includes Abdominal hysterectomy; Tubal ligation; Foot surgery (Left, 2008); Colonoscopy; Upper gastrointestinal endoscopy; Oophorectomy (Right); Carpometacarpel [facility]) suspension plasty (Right, 12/08/2013); I&D extremity (Right, 01/20/2017); and Hand surgery (Right). FAMILY HISTORY Her family history includes Asthma in her mother; Dementia in her brother; Heart attack in her brother; Heart disease in her brother and father; Pancreatic cancer in her brother. SOCIAL HISTORY She  reports that she is a non-smoker but has been exposed to tobacco smoke. She has never used smokeless tobacco. She reports that she does not drink alcohol or use drugs.   Review of Systems  Constitutional: Negative.  Negative for malaise/fatigue and weight loss.  HENT:  Negative.  Negative for hearing loss and tinnitus.   Eyes: Negative.  Negative for blurred vision and double vision.  Respiratory: Negative.  Negative for cough, sputum production, shortness of breath and wheezing.   Cardiovascular: Negative.  Negative for chest pain, palpitations, orthopnea, claudication, leg swelling and PND.  Gastrointestinal: Positive for abdominal pain. Negative for blood in stool, constipation, diarrhea, heartburn, melena, nausea and vomiting.  Genitourinary: Positive for flank pain. Negative for dysuria, frequency, hematuria and urgency.  Musculoskeletal: Positive for back pain  (Chronic, had injections, declines further interventions). Negative for falls, joint pain and myalgias.  Skin: Negative.  Negative for rash.  Neurological: Negative for dizziness, tingling, tremors, sensory change, speech change, focal weakness, seizures, loss of consciousness, weakness and headaches.  Endo/Heme/Allergies: Negative.  Negative for polydipsia.  Psychiatric/Behavioral: Negative.  Negative for depression, memory loss, substance abuse and suicidal ideas. The patient is not nervous/anxious and does not have insomnia.   All other systems reviewed and are negative.   Objective:     Today's Vitals   07/03/18 1009  BP: 122/74  Pulse: 74  Resp: 14  Temp: (!) 97.3 F (36.3 C)  SpO2: 98%  Weight: 137 lb 9.6 oz (62.4 kg)  Height: 5\' 3"  (1.6 m)   Body mass index is 24.37 kg/m.  General appearance: alert, no distress, WD/WN, female HEENT: normocephalic, sclerae anicteric, TMs pearly, nares patent, no discharge or erythema, pharynx normal Oral cavity: MMM, no lesions Neck: supple, no lymphadenopathy, no thyromegaly, no masses Heart: RRR, normal S1, S2, no murmurs Lungs: CTA bilaterally, no wheezes, rhonchi, or rales Abdomen: +bs, soft, generlized tenderness through left and lower quadrants, non distended, no masses, no hepatomegaly, no splenomegaly Musculoskeletal: She has some left lower chest wall tenderness, no swelling, no obvious deformity Extremities: no edema, no cyanosis, no clubbing Pulses: 2+ symmetric, upper and lower extremities, normal cap refill Neurological: alert, oriented x 3, CN2-12 intact, strength normal upper extremities and lower extremities, sensation normal throughout, DTRs 2+  no cerebellar signs, gait normal Psychiatric: normal affect, behavior normal, pleasant  Breasts: symmetrical without palpable lumps, texture intake, nipples not inverted, no discharge, no axillary lymphadenopathy   Izora Ribas, NP   07/03/2018

## 2018-07-03 ENCOUNTER — Ambulatory Visit: Payer: Self-pay | Admitting: Internal Medicine

## 2018-07-03 ENCOUNTER — Ambulatory Visit (HOSPITAL_COMMUNITY)
Admission: RE | Admit: 2018-07-03 | Discharge: 2018-07-03 | Disposition: A | Discharge status: Home / Self Care | Payer: PPO | Source: Ambulatory Visit | Attending: Adult Health | Admitting: Adult Health

## 2018-07-03 ENCOUNTER — Ambulatory Visit: Payer: Self-pay | Admitting: Adult Health

## 2018-07-03 ENCOUNTER — Ambulatory Visit (INDEPENDENT_AMBULATORY_CARE_PROVIDER_SITE_OTHER): Payer: PPO | Admitting: Adult Health

## 2018-07-03 ENCOUNTER — Encounter: Payer: Self-pay | Admitting: Physician Assistant

## 2018-07-03 VITALS — BP 122/74 | HR 74 | Temp 97.3°F | Resp 14 | Ht 63.0 in | Wt 137.6 lb

## 2018-07-03 DIAGNOSIS — Q8789 Other specified congenital malformation syndromes, not elsewhere classified: Secondary | ICD-10-CM

## 2018-07-03 DIAGNOSIS — Z79899 Other long term (current) drug therapy: Secondary | ICD-10-CM

## 2018-07-03 DIAGNOSIS — N183 Chronic kidney disease, stage 3 unspecified: Secondary | ICD-10-CM

## 2018-07-03 DIAGNOSIS — I10 Essential (primary) hypertension: Secondary | ICD-10-CM

## 2018-07-03 DIAGNOSIS — Q858 Other phakomatoses, not elsewhere classified: Secondary | ICD-10-CM

## 2018-07-03 DIAGNOSIS — R0781 Pleurodynia: Secondary | ICD-10-CM

## 2018-07-03 DIAGNOSIS — E782 Mixed hyperlipidemia: Secondary | ICD-10-CM

## 2018-07-03 DIAGNOSIS — I700 Atherosclerosis of aorta: Secondary | ICD-10-CM | POA: Diagnosis not present

## 2018-07-03 DIAGNOSIS — R7309 Other abnormal glucose: Secondary | ICD-10-CM

## 2018-07-03 DIAGNOSIS — L814 Other melanin hyperpigmentation: Secondary | ICD-10-CM

## 2018-07-03 MED ORDER — FUROSEMIDE 20 MG PO TABS: 20.0000 mg | ORAL_TABLET | Freq: Every day | ORAL | 1 refills | Status: DC | PRN

## 2018-07-03 MED ORDER — SUCRALFATE 1 G PO TABS: 1.0000 g | ORAL_TABLET | Freq: Three times a day (TID) | ORAL | 1 refills | Status: DC

## 2018-07-03 MED ORDER — POTASSIUM CHLORIDE CRYS ER 20 MEQ PO TBCR: 20.0000 meq | EXTENDED_RELEASE_TABLET | Freq: Every day | ORAL | 1 refills | Status: DC | PRN

## 2018-07-03 NOTE — Patient Instructions (Addendum)
Goals    . DIET - INCREASE WATER INTAKE     65-80+       Chronic Kidney Disease, Adult Chronic kidney disease (CKD) happens when the kidneys are damaged during a time of 3 or more months. The kidneys are two organs that do many important jobs in the body. These jobs include:  Removing wastes and extra fluids from the blood.  Making hormones that maintain the amount of fluid in your tissues and blood vessels.  Making sure that the body has the right amount of fluids and chemicals.  Most of the time, this condition does not go away, but it can usually be controlled. Steps must be taken to slow down the kidney damage or stop it from getting worse. Otherwise, the kidneys may stop working. Follow these instructions at home:  Follow your diet as told by your doctor. You may need to avoid alcohol, salty foods (sodium), and foods that are high in potassium, calcium, and protein.  Take over-the-counter and prescription medicines only as told by your doctor. Do not take any new medicines unless your doctor says you can do that. These include vitamins and minerals. ? Medicines and nutritional supplements can make kidney damage worse. ? Your doctor may need to change how much medicine you take.  Do not use any tobacco products. These include cigarettes, chewing tobacco, and e-cigarettes. If you need help quitting, ask your doctor.  Keep all follow-up visits as told by your doctor. This is important.  Check your blood pressure. Tell your doctor if there are changes to your blood pressure.  Get to a healthy weight. Stay at that weight. If you need help with this, ask your doctor.  Start or continue an exercise plan. Try to exercise at least 30 minutes a day, 5 days a week.  Stay up-to-date with your shots (immunizations) as told by your doctor. Contact a doctor if:  Your symptoms get worse.  You have new symptoms. Get help right away if:  You have symptoms of end-stage kidney disease.  These include: ? Headaches. ? Skin that is darker or lighter than normal. ? Numbness in your hands or feet. ? Easy bruising. ? Having hiccups often. ? Chest pain. ? Shortness of breath. ? Stopping of menstrual periods in women.  You have a fever.  You are making very little pee (urine).  You have pain or bleeding when you pee (urinate). This information is not intended to replace advice given to you by your health care provider. Make sure you discuss any questions you have with your health care provider. Document Released: 11/15/2009 Document Revised: 01/27/2016 Document Reviewed: 04/19/2012 Elsevier Interactive Patient Education  2017 [place] called impaired glucose tolerance or impaired fasting glucose-is a condition that causes blood sugar (blood glucose) levels to be higher than normal. Following a healthy diet can help to keep prediabetes under control. It can also help to lower the risk of type 2 diabetes and heart disease, which are increased in people who have prediabetes. Along with regular exercise, a healthy diet:  Promotes weight loss.  Helps to control blood sugar levels.  Helps to improve the way that the body uses insulin.  What do I need to know about this eating plan?  Use the glycemic index (GI) to plan your meals. The index tells you how quickly a food will raise your blood sugar. Choose low-GI foods. These foods take a longer time to raise blood  sugar.  Pay close attention to the amount of carbohydrates in the food that you eat. Carbohydrates increase blood sugar levels.  Keep track of how many calories you take in. Eating the right amount of calories will help you to achieve a healthy weight. Losing about 7 percent of your starting weight can help to prevent type 2 diabetes.  You may want to follow a Mediterranean diet. This diet includes a lot of vegetables, lean meats or fish, whole grains, fruits, and  healthy oils and fats. What foods can I eat? Grains Whole grains, such as whole-wheat or whole-grain breads, crackers, cereals, and pasta. Unsweetened oatmeal. Bulgur. Barley. Quinoa. Brown rice. Corn or whole-wheat flour tortillas or taco shells. Vegetables Lettuce. Spinach. Peas. Beets. Cauliflower. Cabbage. Broccoli. Carrots. Tomatoes. Squash. Eggplant. Herbs. Peppers. Onions. Cucumbers. Brussels sprouts. Fruits Berries. Bananas. Apples. Oranges. Grapes. Papaya. Mango. Pomegranate. Kiwi. Grapefruit. Cherries. Meats and Other Protein Sources Seafood. Lean meats, such as chicken and [place] or lean cuts of pork and beef. Tofu. Eggs. Nuts. Beans. Dairy Low-fat or fat-free dairy products, such as yogurt, cottage cheese, and cheese. Beverages Water. Tea. Coffee. Sugar-free or diet soda. Seltzer water. Milk. Milk alternatives, such as soy or almond milk. Condiments Mustard. Relish. Low-fat, low-sugar ketchup. Low-fat, low-sugar barbecue sauce. Low-fat or fat-free mayonnaise. Sweets and Desserts Sugar-free or low-fat pudding. Sugar-free or low-fat ice cream and other frozen treats. Fats and Oils Avocado. Walnuts. Olive oil. The items listed above may not be a complete list of recommended foods or beverages. Contact your dietitian for more options. What foods are not recommended? Grains Refined white flour and flour products, such as bread, pasta, snack foods, and cereals. Beverages Sweetened drinks, such as sweet iced tea and soda. Sweets and Desserts Baked goods, such as cake, cupcakes, pastries, cookies, and cheesecake. The items listed above may not be a complete list of foods and beverages to avoid. Contact your dietitian for more information. This information is not intended to replace advice given to you by your health care provider. Make sure you discuss any questions you have with your health care provider. Document Released: 01/05/2015 Document Revised: 01/27/2016 Document Reviewed:  09/16/2014 Elsevier Interactive Patient Education  2017 [employer].

## 2018-07-04 LAB — CBC WITH DIFFERENTIAL/PLATELET
Basophils Absolute: 31 cells/uL (ref 0–200)
Basophils Relative: 0.5 %
Eosinophils Absolute: 92 cells/uL (ref 15–500)
Eosinophils Relative: 1.5 %
HCT: 42.5 % (ref 35.0–45.0)
Hemoglobin: 14.2 g/dL (ref 11.7–15.5)
Lymphs Abs: 1427 cells/uL (ref 850–3900)
MCH: 29.9 pg (ref 27.0–33.0)
MCHC: 33.4 g/dL (ref 32.0–36.0)
MCV: 89.5 fL (ref 80.0–100.0)
MPV: 10.2 fL (ref 7.5–12.5)
Monocytes Relative: 9.5 %
Neutro Abs: 3971 cells/uL (ref 1500–7800)
Neutrophils Relative %: 65.1 %
Platelets: 209 10*3/uL (ref 140–400)
RBC: 4.75 10*6/uL (ref 3.80–5.10)
RDW: 12.4 % (ref 11.0–15.0)
Total Lymphocyte: 23.4 %
WBC mixed population: 580 cells/uL (ref 200–950)
WBC: 6.1 10*3/uL (ref 3.8–10.8)

## 2018-07-04 LAB — COMPLETE METABOLIC PANEL WITH GFR
AG Ratio: 1.8 (calc) (ref 1.0–2.5)
ALT: 15 U/L (ref 6–29)
AST: 27 U/L (ref 10–35)
Albumin: 4.1 g/dL (ref 3.6–5.1)
Alkaline phosphatase (APISO): 89 U/L (ref 33–130)
BUN: 12 mg/dL (ref 7–25)
CO2: 27 mmol/L (ref 20–32)
Calcium: 9.5 mg/dL (ref 8.6–10.4)
Chloride: 108 mmol/L (ref 98–110)
Creat: 0.96 mg/dL (ref 0.50–0.99)
GFR, Est African American: 70 mL/min/{1.73_m2} (ref >=60)
GFR, Est Non African American: 61 mL/min/{1.73_m2} (ref >=60)
Globulin: 2.3 g/dL (calc) (ref 1.9–3.7)
Glucose, Bld: 66 mg/dL (ref 65–139)
Potassium: 3.9 mmol/L (ref 3.5–5.3)
Sodium: 143 mmol/L (ref 135–146)
Total Bilirubin: 0.4 mg/dL (ref 0.2–1.2)
Total Protein: 6.4 g/dL (ref 6.1–8.1)

## 2018-07-04 LAB — LIPID PANEL
Cholesterol: 165 mg/dL (ref <200)
HDL: 52 mg/dL (ref >50)
LDL Cholesterol (Calc): 87 mg/dL (calc)
Non-HDL Cholesterol (Calc): 113 mg/dL (calc) (ref <130)
Total CHOL/HDL Ratio: 3.2 (calc) (ref <5.0)
Triglycerides: 166 mg/dL — ABNORMAL HIGH (ref <150)

## 2018-07-04 LAB — HEMOGLOBIN A1C
Hgb A1c MFr Bld: 5.5 % of total Hgb (ref <5.7)
Mean Plasma Glucose: 111 (calc)
eAG (mmol/L): 6.2 (calc)

## 2018-07-04 LAB — MAGNESIUM: Magnesium: 2.1 mg/dL (ref 1.5–2.5)

## 2018-07-04 LAB — TSH: TSH: 2.24 mIU/L (ref 0.40–4.50)

## 2018-07-15 ENCOUNTER — Ambulatory Visit: Payer: Self-pay | Admitting: Internal Medicine

## 2018-07-17 ENCOUNTER — Ambulatory Visit (HOSPITAL_COMMUNITY)
Admission: RE | Admit: 2018-07-17 | Discharge: 2018-07-17 | Disposition: A | Discharge status: Home / Self Care | Payer: PPO | Source: Ambulatory Visit | Attending: Internal Medicine | Admitting: Internal Medicine

## 2018-07-17 ENCOUNTER — Ambulatory Visit (INDEPENDENT_AMBULATORY_CARE_PROVIDER_SITE_OTHER): Payer: PPO | Admitting: Internal Medicine

## 2018-07-17 ENCOUNTER — Encounter: Payer: Self-pay | Admitting: Internal Medicine

## 2018-07-17 VITALS — BP 142/82 | HR 78 | Temp 97.5°F | Ht 63.0 in | Wt 137.0 lb

## 2018-07-17 DIAGNOSIS — M25551 Pain in right hip: Secondary | ICD-10-CM

## 2018-07-17 DIAGNOSIS — Z7722 Contact with and (suspected) exposure to environmental tobacco smoke (acute) (chronic): Secondary | ICD-10-CM | POA: Insufficient documentation

## 2018-07-17 DIAGNOSIS — J040 Acute laryngitis: Secondary | ICD-10-CM

## 2018-07-17 DIAGNOSIS — R1030 Lower abdominal pain, unspecified: Secondary | ICD-10-CM | POA: Diagnosis not present

## 2018-07-17 HISTORY — DX: Contact with and (suspected) exposure to environmental tobacco smoke (acute) (chronic): Z77.22

## 2018-07-17 MED ORDER — DEXAMETHASONE 0.5 MG PO TABS: ORAL_TABLET | ORAL | 0 refills | Status: DC

## 2018-07-17 NOTE — Progress Notes (Signed)
Subjective:    Patient ID: Latoya [name], female    DOB: May 12, 1951, 67 y.o.   MRN: [id number]  HPI  This nice 67 yo MWF present with 2 day hx/o S/T and Laryngitis . Denies sinus/chest congestion, cough/sputum, dyspnea or rash.  She also c/o 2-3 day hx of a "catch" or pain in Rt hip area going from sitting to standing position  Medication Sig  . aspirin 81 MG tablet Take 81 mg by mouth at bedtime.   . Biotin 10000 MCG TABS Take by mouth daily.  Marland Kitchen VITAMIN D 1000 units tablet Take 1,000 Units by mouth daily.  . clonazePAM  0.5 MG tablet Take 1 tablet  2  times daily as needed   . diltiazem  120 MG tablet TAKE 1 TABLET  TWICE DAILY  . famotidine  20 MG tablet Take 2 tablets  2 (two) times daily.  . furosemide 20 MG tablet Take 1 tablet  daily as needed for edema.  . levalbuterol 1.25 MG/3ML neb soln Take  neb every 8 hrs as needed  . potassium chloride  20 MEQ tab Take 1 tablet  daily as needed. When taking lasix.  Marland Kitchen pravastatin 40 MG tablet TAKE 1 TABLET BY MOUTH EVERY DAY  . sertraline  100 MG tablet TAKE 1 TABLET(100 MG) BY MOUTH DAILY  . sucralfate  1 g tablet Take 1 tablet (1 g total) by mouth 4 (four) times daily -  with meals and at bedtime.    Allergies  Allergen Reactions  . Other     All pain medications: Unknown/ CODEINE  . Prednisone Other (See Comments)    DYSPHORIA  . Vioxx [Rofecoxib]     unknown  . Entex T [Pseudoephedrine-Guaifenesin] Palpitations  . Levaquin [Levofloxacin] Rash    Tolerated Avelox without adverse effect 11/08/15.  . Sulfa Antibiotics Rash  . Trovan [Alatrofloxacin] Rash   Past Medical History:  Diagnosis Date  . Anxiety and depression   . Arthritis   . Colon polyps   . GERD (gastroesophageal reflux disease)   . Hemorrhoids   . Hiatal hernia   . History of kidney stones    passed  . Hyperlipemia   . Mixed hyperlipidemia 11/26/2013  . Panic disorder   . Renal insufficiency    stage 3 kidney disease per her PCP  . Stomach ulcer   .  Unspecified essential hypertension 11/26/2013     Review of Systems    10 point systems review negative except as above.    Objective:   Physical Exam BP (!) 142/82   Pulse 78   Temp (!) 97.5 F (36.4 C)   Ht 5\' 3"  (1.6 m)   Wt 137 lb (62.1 kg)   SpO2 98%   BMI 24.27 kg/m   Hoarse raspy laryngitic voice. No cough. No stridor.   HEENT - TM's - Nl. No sinus tenderness. O/P - clear . Neck - supple. No tender LN's.  Chest - Clear equal BS. No rales, rhonchi or wheezes.  Cor - Nl HS. RRR w/o sig m. No edema. MS- FROM w/o deformities.  Gait Nl. Neuro -  Nl w/o focal abnormalities.    Assessment & Plan:   1. Laryngitis, prob viral  - dexamethasone  0.5 MG tablet; Take 1 tab 3 x day - 3 days, then 2 x day - 3 days, then 1 tab daily -  Dispense: 20 tablet  2. Right hip pain  - DG HIP UNILAT WITH PELVIS 2-3 VIEWS RIGHT

## 2018-07-23 ENCOUNTER — Telehealth: Payer: Self-pay | Admitting: *Deleted

## 2018-07-23 NOTE — Telephone Encounter (Signed)
Asking for referral to [facility] for referral. Michela Pitcher shes already a patient there

## 2018-07-24 ENCOUNTER — Telehealth: Payer: Self-pay | Admitting: *Deleted

## 2018-07-24 NOTE — Telephone Encounter (Signed)
Patient called and reported she still has congestion in her head and ears are popping.  Per Dr Melford Aase, try OTC Phenylephrine 10 mg tablet every 4 hours. Patient is aware.

## 2018-09-04 HISTORY — PX: CATARACT EXTRACTION: SUR2

## 2018-10-02 ENCOUNTER — Encounter: Payer: Self-pay | Admitting: Adult Health

## 2018-10-02 NOTE — Progress Notes (Signed)
MEDICARE WELLNESS  Assessment and Plan:   Encounter for Medicare annual wellness exam  Essential hypertension - continue medications, DASH diet, exercise and monitor at home. Call if greater than 130/80.  -     CBC with Differential/Platelet -     CMP/GFR -     TSH  Aortic atherosclerosis (HCC) Control blood pressure, cholesterol, glucose, increase exercise.   Thyroid nodule -stable on follow up US 2018, recommended 5 years of annual thyroid US for follow up - US soft tissue (thyroid)   CKD (chronic kidney disease) stage 3, GFR 30-59 ml/min Increase fluids, avoid NSAIDS, monitor sugars, will monitor  Mixed hyperlipidemia -continue medications, check lipids, decrease fatty foods, increase activity.  -     Lipid panel  Other abnormal glucose Discussed general issues about diabetes pathophysiology and management., Educational material distributed., Suggested low cholesterol diet., Encouraged aerobic exercise., Discussed foot care., Reminded to get yearly retinal exam.  Medication management -     Magnesium  Hiatal hernia Continue GERD medication, follow up GI as needed Avoid lying down after meals   Chronic rhinitis - Allegra OTC, increase H20, allergy hygiene explained.  Gastroesophageal reflux disease, esophagitis presence not specified/LPR Continue H2 blocker, Add diet discussed  Generalized anxiety disorder Increase zoloft to 150 mg daily, USE THE KLONOPIN IF NEEDED stress management techniques discussed, increase water, good sleep hygiene discussed, increase exercise, and increase veggies.   Vitamin D deficiency At goal at recent check; continue to recommend supplementation for goal of 70-100 Defer vitamin D level  Chronic cough syndrome Continue GERD meds, follow up Dr. Melvyn Novas as needed.   Epigastric pain Check labs, continue H2 inhibitor, add nexium or prilosec BID x 2 weeks Follow up if not improving  Over 40 minutes of exam, counseling, chart review and  critical decision making was performed No future appointments.  Plan:   During the course of the visit the patient was educated and counseled about appropriate screening and preventive services including:    Pneumococcal vaccine   Prevnar 13  Influenza vaccine  Td vaccine  Screening electrocardiogram  Bone densitometry screening  Colorectal cancer screening  Diabetes screening  Glaucoma screening  Nutrition counseling   Advanced directives: requested    Subjective:  Latoya [name] is a 68 y.o. female who presents for medicare wellness 3 month follow up for htn, hyperlipidemia, glucose management, depression/anxiety, weight and vitamin D.   She has GERD/LPR, had EGD 10/2017, on pepcid 40 mg daily per Dr. Carlean Purl; she also has dx of chronic cough syndrome per Dr. Melvyn Novas after extensive workup for persistent cough. She reports some epigastric discomfort, cough has resolved. Denies any other sx, fever/chills, N/V/D, reflux, burning, hematochezia, melena.   she has a diagnosis of depression/anxiety and is currently on zoloft 100 mg daily, klonopin 0.5 mg PRN, reports symptoms are not well controlled on current regimen, under a lot of stress r/t her husbands heath and her daughter who is living with them. she currently reports using rarely, cuts in 1/4s and takes no more than 1-2 tabs per month.   BMI is Body mass index is 24.2 kg/m., she has been working on diet and exercise. Wt Readings from Last 3 Encounters:  10/03/18 136 lb 9.6 oz (62 kg)  07/17/18 137 lb (62.1 kg)  07/03/18 137 lb 9.6 oz (62.4 kg)   Her blood pressure has been controlled at home, today their BP is BP: 122/80  She does workout, walking, yard work, house work. She denies chest  pain, shortness of breath, dizziness.   She is on cholesterol medication and denies myalgias. Her cholesterol is not at goal. The cholesterol last visit was:   Lab Results  Component Value Date   CHOL 165 07/03/2018   HDL 52  07/03/2018   LDLCALC 87 07/03/2018   TRIG 166 (H) 07/03/2018   CHOLHDL 3.2 07/03/2018    She has been working on diet and exercise for glucose management, and denies paresthesia of the feet, polydipsia, polyuria and visual disturbances. Last A1C in the office was:  Lab Results  Component Value Date   HGBA1C 5.5 07/03/2018   Last GFR: Lab Results  Component Value Date   GFRNONAA 61 07/03/2018   Patient is on Vitamin D supplement.   Lab Results  Component Value Date   VD25OH 62 04/01/2018        Medication Review: Current Outpatient Medications on File Prior to Visit  Medication Sig  . aspirin 81 MG tablet Take 81 mg by mouth at bedtime.   . Biotin 10000 MCG TABS Take by mouth daily.  . cholecalciferol (VITAMIN D) 1000 units tablet Take 1,000 Units by mouth daily.  . clonazePAM (KLONOPIN) 0.5 MG tablet Take 1 tablet (0.5 mg total) by mouth 2 (two) times daily as needed for anxiety (limit the amount, medication is addictive).  Marland Kitchen diltiazem (CARDIZEM) 120 MG tablet TAKE 1 TABLET(120 MG) BY MOUTH TWICE DAILY  . famotidine (PEPCID) 20 MG tablet Take 2 tablets (40 mg total) by mouth 2 (two) times daily.  . furosemide (LASIX) 20 MG tablet Take 1 tablet (20 mg total) by mouth daily as needed for edema.  . potassium chloride SA (K-DUR,KLOR-CON) 20 MEQ tablet Take 1 tablet (20 mEq total) by mouth daily as needed. When taking lasix.  Marland Kitchen pravastatin (PRAVACHOL) 40 MG tablet TAKE 1 TABLET BY MOUTH EVERY DAY  . sertraline (ZOLOFT) 100 MG tablet TAKE 1 TABLET(100 MG) BY MOUTH DAILY  . levalbuterol (XOPENEX) 1.25 MG/3ML nebulizer solution Take 1.25 mg by nebulization every 8 (eight) hours as needed for wheezing or shortness of breath. (Patient not taking: Reported on 10/03/2018)  . sucralfate (CARAFATE) 1 g tablet Take 1 tablet (1 g total) by mouth 4 (four) times daily -  with meals and at bedtime. (Patient not taking: Reported on 10/03/2018)   No current facility-administered medications on file  prior to visit.      Allergies  Allergen Reactions  . Other     All pain medications: Unknown/ CODEINE  . Prednisone Other (See Comments)    DYSPHORIA  . Vioxx [Rofecoxib]     unknown  . Entex T [Pseudoephedrine-Guaifenesin] Palpitations  . Levaquin [Levofloxacin] Rash    Tolerated Avelox without adverse effect 11/08/15.  . Sulfa Antibiotics Rash  . Trovan [Alatrofloxacin] Rash    Current Problems (verified) Patient Active Problem List   Diagnosis Date Noted  . FHx: heart disease 11/27/2017  . 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 Laryngopharyngeal reflux (LPR) 10/12/2016  . Chronic rhinitis 10/09/2016  . Hiatal hernia 07/14/2016  . Aortic atherosclerosis ([place]) 06/30/2016  . CKD (chronic kidney disease) stage 3, GFR 30-59 ml/min (HCC) 06/30/2016  . Thyroid nodule 12/24/2015  . Abnormal glucose 05/01/2014  . Vitamin D deficiency 05/01/2014  . Medication management 05/01/2014  . Essential hypertension 11/26/2013  . Mixed hyperlipidemia 11/26/2013  . Chronic cough 09/19/2013  . Generalized anxiety disorder 07/11/2013  . Esophageal reflux 11/29/2012    Screening Tests Immunization History  Administered Date(s) Administered  .  Influenza Split 06/04/2014, 06/05/2015  . Influenza, High Dose Seasonal PF 05/30/2017, 05/22/2018  . Influenza-Unspecified 06/06/2013, 06/13/2016  . Pneumococcal Conjugate-13 06/05/2015  . Pneumococcal Polysaccharide-23 04/06/2014  . Pneumococcal-Unspecified 09/04/1996  . Td 09/04/2005  . Tdap 03/22/2017   Preventative care: Last colonoscopy: 12/2012 Carlean Purl, 10 year follow up EGD 10/2017 Last mammogram: 11/2017 Last pap smear/pelvic exam: 2016 normal  DEXA: dec 2014 will order PFT 02/2016 [place] AB 02/2015 US thyroid 05/2017 need repeat 1 year  CT chest 12/24/2015  Prior vaccinations: TD or Tdap: 2018  Influenza: 2019 Pneumococcal: 2015 Prevnar13: 2016 Shingles/Zostavax: declines due to cost  Names of Other  Physician/Practitioners you currently use: 1. [facility] here for primary care 2. [facility], eye doctor, last visit 2020 3. [facility], dentist, last visit 2018, needs to schedule  Patient Care Team: Unk Pinto, MD as PCP - General (Internal Medicine) Aquilla Hacker, MD as Referring Physician (Psychiatry) Jannette Spanner, MD as Referring Physician (Dermatology)  SURGICAL HISTORY She  has a past surgical history that includes Abdominal hysterectomy; Tubal ligation; Foot surgery (Left, 2008); Colonoscopy; Upper gastrointestinal endoscopy; Oophorectomy (Right); Carpometacarpel [facility]) suspension plasty (Right, 12/08/2013); I&D extremity (Right, 01/20/2017); and Hand surgery (Right). FAMILY HISTORY Her family history includes Asthma in her mother; Dementia in her brother; Heart attack in her brother; Heart disease in her brother and father; Pancreatic cancer in her brother. SOCIAL HISTORY She  reports that she is a non-smoker but has been exposed to tobacco smoke. She has never used smokeless tobacco. She reports that she does not drink alcohol or use drugs.  MEDICARE WELLNESS OBJECTIVES: Physical activity: Current Exercise Habits: The patient does not participate in regular exercise at present, Exercise limited by: None identified Cardiac risk factors: Cardiac Risk Factors include: dyslipidemia;hypertension;advanced age (>59men, >12 women);family history of premature cardiovascular disease Depression/mood screen:   Depression screen [facility] 2/9 10/03/2018  Decreased Interest 1  Down, Depressed, Hopeless 1  PHQ - 2 Score 2  Altered sleeping 1  Tired, decreased energy 1  Change in appetite 0  Feeling bad or failure about yourself  0  Trouble concentrating 1  Moving slowly or fidgety/restless 0  Suicidal thoughts 0  PHQ-9 Score 5  Difficult doing work/chores Somewhat difficult    ADLs:  In your present state of health, do you have any difficulty  performing the following activities: 10/03/2018 11/27/2017  Hearing? N N  Vision? N N  Difficulty concentrating or making decisions? N N  Walking or climbing stairs? N N  Dressing or bathing? N N  Doing errands, shopping? N N  Some recent data might be hidden     Cognitive Testing  Alert? Yes  Normal Appearance?Yes  Oriented to person? Yes  Place? Yes   Time? Yes  Recall of three objects?  Yes  Can perform simple calculations? Yes  Displays appropriate judgment?Yes  Can read the correct time from a watch face?Yes  EOL planning: Does Patient Have a Medical Advance Directive?: Yes Type of Advance Directive: Healthcare Power of Attorney, Living will Does patient want to make changes to medical advance directive?: No - Patient declined Copy of [place] in Chart?: No - copy requested Would patient like information on creating a medical advance directive?: No - Patient declined   Review of Systems  Constitutional: Negative.  Negative for chills and fever.  HENT: Negative.   Eyes: Negative.   Respiratory: Negative for cough, hemoptysis, sputum production, shortness of breath and wheezing.  Cardiovascular: Negative for chest pain, palpitations, orthopnea, claudication, leg swelling and PND.  Gastrointestinal: Positive for abdominal pain (generalized). Negative for blood in stool, constipation, diarrhea, heartburn, melena, nausea and vomiting.  Genitourinary: Negative.   Musculoskeletal: Negative.   Skin: Negative.   Neurological: Negative for dizziness, tingling, tremors, sensory change, speech change, focal weakness, seizures, loss of consciousness and headaches.  Endo/Heme/Allergies: Negative.   Psychiatric/Behavioral: Negative for depression, substance abuse and suicidal ideas. The patient is nervous/anxious. The patient does not have insomnia.     Objective:     Today's Vitals   10/03/18 1058  BP: 122/80  Pulse: 83  Temp: 97.7 F (36.5 C)  SpO2: 98%   Weight: 136 lb 9.6 oz (62 kg)  Height: 5\' 3"  (1.6 m)  PainLoc: Abdomen   Body mass index is 24.2 kg/m.  General appearance: alert, no distress, WD/WN, female HEENT: normocephalic, sclerae anicteric, TMs pearly, nares patent, no discharge or erythema, pharynx normal Oral cavity: MMM, no lesions Neck: supple, no lymphadenopathy, no thyromegaly, no masses Heart: RRR, normal S1, S2, no murmurs Lungs: CTA bilaterally, no wheezes, rhonchi, or rales Abdomen: +bs, soft, vague generalized upper abd tenderness, non distended, no masses, no hepatomegaly, no splenomegaly Musculoskeletal: nontender, no swelling, no obvious deformity Extremities: no edema, no cyanosis, no clubbing Pulses: 2+ symmetric, upper and lower extremities, normal cap refill Neurological: alert, oriented x 3, CN2-12 intact, strength normal upper extremities and lower extremities, sensation normal throughout, DTRs 2+  no cerebellar signs, gait normal Psychiatric: anxious affect, behavior normal, pleasant    Medicare Attestation I have personally reviewed: The patient's medical and social history Their use of alcohol, tobacco or illicit drugs Their current medications and supplements The patient's functional ability including ADLs,fall risks, home safety risks, cognitive, and hearing and visual impairment Diet and physical activities Evidence for depression or mood disorders  The patient's weight, height, BMI, and visual acuity have been recorded in the chart.  I have made referrals, counseling, and provided education to the patient based on review of the above and I have provided the patient with a written personalized care plan for preventive services.     Izora Ribas, NP   10/03/2018

## 2018-10-03 ENCOUNTER — Encounter: Payer: Self-pay | Admitting: Adult Health

## 2018-10-03 ENCOUNTER — Ambulatory Visit (INDEPENDENT_AMBULATORY_CARE_PROVIDER_SITE_OTHER): Payer: PPO | Admitting: Adult Health

## 2018-10-03 VITALS — BP 122/80 | HR 83 | Temp 97.7°F | Ht 63.0 in | Wt 136.6 lb

## 2018-10-03 DIAGNOSIS — R7309 Other abnormal glucose: Secondary | ICD-10-CM

## 2018-10-03 DIAGNOSIS — Z8249 Family history of ischemic heart disease and other diseases of the circulatory system: Secondary | ICD-10-CM | POA: Diagnosis not present

## 2018-10-03 DIAGNOSIS — N183 Chronic kidney disease, stage 3 unspecified: Secondary | ICD-10-CM

## 2018-10-03 DIAGNOSIS — E559 Vitamin D deficiency, unspecified: Secondary | ICD-10-CM | POA: Diagnosis not present

## 2018-10-03 DIAGNOSIS — J310 Chronic rhinitis: Secondary | ICD-10-CM

## 2018-10-03 DIAGNOSIS — K219 Gastro-esophageal reflux disease without esophagitis: Secondary | ICD-10-CM

## 2018-10-03 DIAGNOSIS — Q8789 Other specified congenital malformation syndromes, not elsewhere classified: Secondary | ICD-10-CM

## 2018-10-03 DIAGNOSIS — Z0000 Encounter for general adult medical examination without abnormal findings: Secondary | ICD-10-CM

## 2018-10-03 DIAGNOSIS — Z79899 Other long term (current) drug therapy: Secondary | ICD-10-CM

## 2018-10-03 DIAGNOSIS — R6889 Other general symptoms and signs: Secondary | ICD-10-CM

## 2018-10-03 DIAGNOSIS — E041 Nontoxic single thyroid nodule: Secondary | ICD-10-CM | POA: Diagnosis not present

## 2018-10-03 DIAGNOSIS — E2839 Other primary ovarian failure: Secondary | ICD-10-CM

## 2018-10-03 DIAGNOSIS — K449 Diaphragmatic hernia without obstruction or gangrene: Secondary | ICD-10-CM

## 2018-10-03 DIAGNOSIS — R053 Chronic cough: Secondary | ICD-10-CM

## 2018-10-03 DIAGNOSIS — I700 Atherosclerosis of aorta: Secondary | ICD-10-CM | POA: Diagnosis not present

## 2018-10-03 DIAGNOSIS — R300 Dysuria: Secondary | ICD-10-CM | POA: Diagnosis not present

## 2018-10-03 DIAGNOSIS — E782 Mixed hyperlipidemia: Secondary | ICD-10-CM | POA: Diagnosis not present

## 2018-10-03 DIAGNOSIS — Z0001 Encounter for general adult medical examination with abnormal findings: Secondary | ICD-10-CM

## 2018-10-03 DIAGNOSIS — F411 Generalized anxiety disorder: Secondary | ICD-10-CM

## 2018-10-03 DIAGNOSIS — R05 Cough: Secondary | ICD-10-CM

## 2018-10-03 DIAGNOSIS — L814 Other melanin hyperpigmentation: Secondary | ICD-10-CM

## 2018-10-03 DIAGNOSIS — I10 Essential (primary) hypertension: Secondary | ICD-10-CM

## 2018-10-03 DIAGNOSIS — Q858 Other phakomatoses, not elsewhere classified: Secondary | ICD-10-CM

## 2018-10-03 DIAGNOSIS — Z1211 Encounter for screening for malignant neoplasm of colon: Secondary | ICD-10-CM

## 2018-10-03 MED ORDER — ESCITALOPRAM OXALATE 10 MG PO TABS: 10.0000 mg | ORAL_TABLET | Freq: Every day | ORAL | 1 refills | Status: DC

## 2018-10-03 MED ORDER — SERTRALINE HCL 100 MG PO TABS: 150.0000 mg | ORAL_TABLET | Freq: Every day | ORAL | 1 refills | Status: DC

## 2018-10-03 NOTE — Patient Instructions (Addendum)
Try nexium, prilosec OTC once or twice daily for two weeks for your stomach. Call me back if this is helping and we can write a prescription  Increase zoloft to 150 mg daily (1.5 tabs)  Use klonopin as needed - ok to use up to 5 days a week   Latoya [name] , Thank you for taking time to come for your Medicare Wellness Visit. I appreciate your ongoing commitment to your health goals. Please review the following plan we discussed and let me know if I can assist you in the future.   These are the goals we discussed: Goals    . DIET - INCREASE WATER INTAKE     65-80+       This is a list of the screening recommended for you and due dates:  Health Maintenance  Topic Date Due  . Flu Shot  04/04/2018  .  Hepatitis C: One time screening is recommended by Center for Disease Control  (CDC) for  adults born from [birth year] through 1966.   10/04/2019*  . Mammogram  12/01/2019  . Colon Cancer Screening  12/13/2022  . Tetanus Vaccine  03/23/2027  . DEXA scan (bone density measurement)  Completed  . Pneumonia vaccines  Discontinued  *Topic was postponed. The date shown is not the original due date.      Stress Stress is a normal reaction to life events. Stress is what you feel when life demands more than you are used to, or more than you think you can handle. Some stress can be useful, such as studying for a test or meeting a deadline at work. Stress that occurs too often or for too long can cause problems. It can affect your emotional health and interfere with relationships and normal daily activities. Too much stress can weaken your body's defense system (immune system) and increase your risk for physical illness. If you already have a medical problem, stress can make it worse. What are the causes? All sorts of life events can cause stress. An event that causes stress for one person may not be stressful for another person. Major life events, whether positive or negative, commonly cause stress. Examples  include:  Losing a job or starting a new job.  Losing a loved one.  Moving to a new town or home.  Getting married or divorced.  Having a baby.  Injury or illness. Less obvious life events can also cause stress, especially if they occur day after day or in combination with each other. Examples include:  Working long hours.  Driving in traffic.  Caring for children.  Being in debt.  Being in a difficult relationship. What are the signs or symptoms? Stress can cause emotional symptoms, including:  Anxiety. This is feeling worried, afraid, on edge, overwhelmed, or out of control.  Anger, including irritation or impatience.  Depression. This is feeling sad, down, helpless, or guilty.  Trouble focusing, remembering, or making decisions. Stress can cause physical symptoms, including:  Aches and pains. These may affect your head, neck, back, stomach, or other areas of your body.  Tight muscles or a clenched jaw.  Low energy.  Trouble sleeping. Stress can cause unhealthy behaviors, including:  Eating to feel better (overeating) or skipping meals.  Working too much or putting off tasks.  Smoking, drinking alcohol, or using drugs to feel better. How is this diagnosed? Stress is diagnosed through an assessment by your health care provider. He or she may diagnose this condition based on:  Your symptoms  and any stressful life events.  Your medical history.  Tests to rule out other causes of your symptoms. Depending on your condition, your health care provider may refer you to a specialist for further evaluation. How is this treated?  Stress management techniques are the recommended treatment for stress. Medicine is not typically recommended for the treatment of stress. Techniques to reduce your reaction to stressful life events include:  Stress identification. Monitor yourself for symptoms of stress and identify what causes stress for you. These skills may help you  to avoid or prepare for stressful events.  Time management. Set your priorities, keep a calendar of events, and learn to say "no." Taking these actions can help you avoid making too many commitments. Techniques for coping with stress include:  Rethinking the problem. Try to think realistically about stressful events rather than ignoring them or overreacting. Try to find the positives in a stressful situation rather than focusing on the negatives.  Exercise. Physical exercise can release both physical and emotional tension. The key is to find a form of exercise that you enjoy and do it regularly.  Relaxation techniques. These relax the body and mind. The key is to find one or more that you enjoy and use the technique(s) regularly. Examples include: ? Meditation, deep breathing, or progressive relaxation techniques. ? Yoga or tai chi. ? Biofeedback, mindfulness techniques, or journaling. ? Listening to music, being out in nature, or participating in other hobbies.  Practicing a healthy lifestyle. Eat a balanced diet, drink plenty of water, limit or avoid caffeine, and get plenty of sleep.  Having a strong support network. Spend time with family, friends, or other people you enjoy being around. Express your feelings and talk things over with someone you trust. Counseling or talk therapy with a mental health professional may be helpful if you are having trouble managing stress on your own. Follow these instructions at home: Lifestyle   Avoid drugs.  Do not use any products that contain nicotine or tobacco, such as cigarettes and e-cigarettes. If you need help quitting, ask your health care provider.  Limit alcohol intake to no more than 1 drink a day for nonpregnant women and 2 drinks a day for men. One drink equals 12 oz of beer, 5 oz of wine, or 1 oz of hard liquor.  Do not use alcohol or drugs to relax.  Eat a balanced diet that includes fresh fruits and vegetables, whole grains, lean  meats, fish, eggs, and beans, and low-fat dairy. Avoid processed foods and foods high in added fat, sugar, and salt.  Exercise at least 30 minutes on 5 or more days each week.  Get 7-8 hours of sleep each night. General instructions   Practice stress management techniques as discussed with your health care provider.  Drink enough fluid to keep your urine clear or pale yellow.  Take over-the-counter and prescription medicines only as told by your health care provider.  Keep all follow-up visits as told by your health care provider. This is important. Contact a health care provider if:  Your symptoms get worse.  You have new symptoms.  You feel overwhelmed by your problems and can no longer manage them on your own. Get help right away if:  You have thoughts of hurting yourself or others. If you ever feel like you may hurt yourself or others, or have thoughts about taking your own life, get help right away. You can go to your nearest emergency department or call:  Your  local emergency services (911 in the [place]  A suicide crisis helpline, such as the [facility] at 580-503-0604. This is open 24 hours a day. Summary  Stress is a normal reaction to life events. It can cause problems if it happens too often or for too long.  Practicing stress management techniques is the best way to treat stress.  Counseling or talk therapy with a mental health professional may be helpful if you are having trouble managing stress on your own. This information is not intended to replace advice given to you by your health care provider. Make sure you discuss any questions you have with your health care provider. Document Released: 02/14/2001 Document Revised: 10/11/2016 Document Reviewed: 10/11/2016 Elsevier Interactive Patient Education  2019 [employer].

## 2018-10-04 ENCOUNTER — Other Ambulatory Visit: Payer: Self-pay | Admitting: Adult Health

## 2018-10-04 ENCOUNTER — Telehealth: Payer: Self-pay

## 2018-10-04 LAB — COMPLETE METABOLIC PANEL WITH GFR
AG Ratio: 2 (calc) (ref 1.0–2.5)
ALT: 15 U/L (ref 6–29)
AST: 27 U/L (ref 10–35)
Albumin: 4.3 g/dL (ref 3.6–5.1)
Alkaline phosphatase (APISO): 75 U/L (ref 33–130)
BUN/Creatinine Ratio: 17 (calc) (ref 6–22)
BUN: 18 mg/dL (ref 7–25)
CO2: 27 mmol/L (ref 20–32)
Calcium: 9.9 mg/dL (ref 8.6–10.4)
Chloride: 110 mmol/L (ref 98–110)
Creat: 1.06 mg/dL — ABNORMAL HIGH (ref 0.50–0.99)
GFR, Est African American: 62 mL/min/{1.73_m2} (ref >=60)
GFR, Est Non African American: 54 mL/min/{1.73_m2} — ABNORMAL LOW (ref >=60)
Globulin: 2.2 g/dL (calc) (ref 1.9–3.7)
Glucose, Bld: 63 mg/dL — ABNORMAL LOW (ref 65–99)
Potassium: 4 mmol/L (ref 3.5–5.3)
Sodium: 145 mmol/L (ref 135–146)
Total Bilirubin: 0.3 mg/dL (ref 0.2–1.2)
Total Protein: 6.5 g/dL (ref 6.1–8.1)

## 2018-10-04 LAB — CBC WITH DIFFERENTIAL/PLATELET
Absolute Monocytes: 515 cells/uL (ref 200–950)
Basophils Absolute: 53 cells/uL (ref 0–200)
Basophils Relative: 0.8 %
Eosinophils Absolute: 152 cells/uL (ref 15–500)
Eosinophils Relative: 2.3 %
HCT: 43.2 % (ref 35.0–45.0)
Hemoglobin: 14.5 g/dL (ref 11.7–15.5)
Lymphs Abs: 1373 cells/uL (ref 850–3900)
MCH: 30.4 pg (ref 27.0–33.0)
MCHC: 33.6 g/dL (ref 32.0–36.0)
MCV: 90.6 fL (ref 80.0–100.0)
MPV: 10.4 fL (ref 7.5–12.5)
Monocytes Relative: 7.8 %
Neutro Abs: 4508 cells/uL (ref 1500–7800)
Neutrophils Relative %: 68.3 %
Platelets: 211 10*3/uL (ref 140–400)
RBC: 4.77 10*6/uL (ref 3.80–5.10)
RDW: 13.2 % (ref 11.0–15.0)
Total Lymphocyte: 20.8 %
WBC: 6.6 10*3/uL (ref 3.8–10.8)

## 2018-10-04 LAB — URINALYSIS W MICROSCOPIC + REFLEX CULTURE
Bacteria, UA: NONE SEEN /HPF
Bilirubin Urine: NEGATIVE
Glucose, UA: NEGATIVE
Hgb urine dipstick: NEGATIVE
Ketones, ur: NEGATIVE
Leukocyte Esterase: NEGATIVE
Nitrites, Initial: NEGATIVE
Specific Gravity, Urine: 1.025 (ref 1.001–1.03)
Squamous Epithelial / LPF: NONE SEEN /HPF (ref <=5)
WBC, UA: NONE SEEN /HPF (ref 0–5)
pH: 6 (ref 5.0–8.0)

## 2018-10-04 LAB — LIPID PANEL
Cholesterol: 164 mg/dL (ref <200)
HDL: 59 mg/dL (ref >50)
LDL Cholesterol (Calc): 79 mg/dL (calc)
Non-HDL Cholesterol (Calc): 105 mg/dL (calc) (ref <130)
Total CHOL/HDL Ratio: 2.8 (calc) (ref <5.0)
Triglycerides: 156 mg/dL — ABNORMAL HIGH (ref <150)

## 2018-10-04 LAB — TSH: TSH: 1.9 mIU/L (ref 0.40–4.50)

## 2018-10-04 LAB — MAGNESIUM: Magnesium: 2.3 mg/dL (ref 1.5–2.5)

## 2018-10-04 LAB — NO CULTURE INDICATED

## 2018-10-04 MED ORDER — CLONAZEPAM 0.5 MG PO TABS: 0.5000 mg | ORAL_TABLET | Freq: Two times a day (BID) | ORAL | 0 refills | Status: DC | PRN

## 2018-10-04 NOTE — Telephone Encounter (Signed)
Refill request for Klonopin. Last sent in on 04/26/18

## 2018-10-14 ENCOUNTER — Other Ambulatory Visit: Payer: Self-pay | Admitting: Adult Health

## 2018-10-14 ENCOUNTER — Telehealth: Payer: Self-pay

## 2018-10-14 NOTE — Telephone Encounter (Signed)
Patient complaining of sinus pressure, headache and dizziness. Would like to know what she can take that is OTC. Any recommendations?

## 2018-10-15 NOTE — Telephone Encounter (Signed)
LMTCB

## 2018-10-23 ENCOUNTER — Ambulatory Visit
Admission: RE | Admit: 2018-10-23 | Discharge: 2018-10-23 | Disposition: A | Discharge status: Home / Self Care | Payer: PPO | Source: Ambulatory Visit | Attending: Adult Health | Admitting: Adult Health

## 2018-10-23 ENCOUNTER — Other Ambulatory Visit: Payer: Self-pay | Admitting: Internal Medicine

## 2018-10-23 DIAGNOSIS — E041 Nontoxic single thyroid nodule: Secondary | ICD-10-CM | POA: Diagnosis not present

## 2018-10-23 DIAGNOSIS — Z1231 Encounter for screening mammogram for malignant neoplasm of breast: Secondary | ICD-10-CM

## 2018-11-09 ENCOUNTER — Other Ambulatory Visit: Payer: Self-pay | Admitting: Adult Health

## 2018-12-02 ENCOUNTER — Ambulatory Visit: Payer: Self-pay

## 2018-12-04 ENCOUNTER — Encounter: Payer: Self-pay | Admitting: Adult Health

## 2018-12-04 ENCOUNTER — Ambulatory Visit: Payer: PPO | Admitting: Adult Health

## 2018-12-04 ENCOUNTER — Other Ambulatory Visit: Payer: Self-pay

## 2018-12-04 VITALS — BP 134/81 | HR 73 | Temp 96.8°F | Wt 137.0 lb

## 2018-12-04 DIAGNOSIS — J019 Acute sinusitis, unspecified: Secondary | ICD-10-CM

## 2018-12-04 DIAGNOSIS — R739 Hyperglycemia, unspecified: Secondary | ICD-10-CM | POA: Diagnosis not present

## 2018-12-04 DIAGNOSIS — Z125 Encounter for screening for malignant neoplasm of prostate: Secondary | ICD-10-CM | POA: Diagnosis not present

## 2018-12-04 DIAGNOSIS — Z9109 Other allergy status, other than to drugs and biological substances: Secondary | ICD-10-CM | POA: Diagnosis not present

## 2018-12-04 DIAGNOSIS — Z0000 Encounter for general adult medical examination without abnormal findings: Secondary | ICD-10-CM | POA: Diagnosis not present

## 2018-12-04 DIAGNOSIS — Z6828 Body mass index (BMI) 28.0-28.9, adult: Secondary | ICD-10-CM | POA: Diagnosis not present

## 2018-12-04 DIAGNOSIS — Z23 Encounter for immunization: Secondary | ICD-10-CM | POA: Diagnosis not present

## 2018-12-04 DIAGNOSIS — E785 Hyperlipidemia, unspecified: Secondary | ICD-10-CM | POA: Diagnosis not present

## 2018-12-04 MED ORDER — FEXOFENADINE HCL 180 MG PO TABS: 180.0000 mg | ORAL_TABLET | Freq: Every day | ORAL | 1 refills | Status: DC

## 2018-12-04 MED ORDER — PROMETHAZINE-DM 6.25-15 MG/5ML PO SYRP: 5.0000 mL | ORAL_SOLUTION | Freq: Four times a day (QID) | ORAL | 1 refills | Status: DC | PRN

## 2018-12-04 MED ORDER — AZITHROMYCIN 250 MG PO TABS: ORAL_TABLET | ORAL | 1 refills | Status: AC

## 2018-12-04 MED ORDER — PREDNISONE 5 MG PO TABS: ORAL_TABLET | ORAL | 0 refills | Status: DC

## 2018-12-04 NOTE — Progress Notes (Signed)
Virtual Visit via Telephone Note  I connected with Latoya [name] on 12/04/18 at  3:30 PM EDT by telephone and verified that I am speaking with the correct person using two identifiers.   I discussed the limitations, risks, security and privacy concerns of performing an evaluation and management service by telephone and the availability of in person appointments. I also discussed with the patient that there may be a patient responsible charge related to this service. The patient expressed understanding and agreed to proceed.   History of Present Illness:  BP 134/81   Pulse 73   Temp (!) 96.8 F (36 C)   Wt 137 lb (62.1 kg)   SpO2 98%   BMI 24.27 kg/m   68 y.o. with hx of chronic rhinitis, LPR, reports 3 days of nasal congestion, bilateral ear pressure (worse in R), scratchy throat, runny nose and post-nasal drip, mild headache/sinus pressure without pain or tenderness. She endorse mild watery eyes. She denies sneezing or coughing. She denies fever/chills, wheezing, dyspnea, myalgias, rash.   She has been using mucus relief with minimal relief, has tried tylenol which hasn't helped headache at all.   She denies known sick contacts, no recent travel, has been socially distancing, leaving home only once a week when absolutely necessary. She denies history of allergies.     Observations/Objective:   Assessment and Plan:  Latoya [name] was seen today for acute visit.  Diagnoses and all orders for this visit:  Acute rhinosinusitis Discussed the importance of avoiding unnecessary antibiotic therapy - hold zpak unless sympotms persisting past 7 day duration, or progressive over weekend with fever and productive cough Suggested symptomatic OTC remedies. Nasal saline spray for congestion. Nasal steroids, allergy pill, oral steroids offered Follow up as needed. -     predniSONE (DELTASONE) 5 MG tablet; Take 1 tab daily for 1 week. -     azithromycin (ZITHROMAX) 250 MG tablet; Take 2 tablets  (500 mg) on  Day 1,  followed by 1 tablet (250 mg) once daily on Days 2 through 5. -     promethazine-dextromethorphan (PROMETHAZINE-DM) 6.25-15 MG/5ML syrup; Take 5 mLs by mouth 4 (four) times daily as needed for cough.  Environmental allergies -     fexofenadine (ALLEGRA) 180 MG tablet; Take 1 tablet (180 mg total) by mouth daily.   Follow Up Instructions:    I discussed the assessment and treatment plan with the patient. The patient was provided an opportunity to ask questions and all were answered. The patient agreed with the plan and demonstrated an understanding of the instructions.   The patient was advised to call back or seek an in-person evaluation if the symptoms worsen or if the condition fails to improve as anticipated.  I provided 15 minutes of non-face-to-face time during this encounter.   Izora Ribas, NP

## 2018-12-11 ENCOUNTER — Telehealth: Payer: Self-pay

## 2018-12-11 NOTE — Telephone Encounter (Signed)
Patient will come in for an appt. Patient transferred to front office in order to schedule said appt.

## 2018-12-11 NOTE — Telephone Encounter (Signed)
-----   Message from [name], [place] sent at 12/11/2018  1:36 PM EDT ----- Regarding: RE: thyroid Contact: [phone] The last lab her thyroid was normal, if she feels like this has changed she will need to come here first so we can repeat labs before we refer her to a specialist.  Estill Bamberg ----- Message ----- From: [name], CMA Sent: 12/11/2018  12:10 PM EDT To: [name], PA-C Subject: thyroid                                        Patient thinks she has an over active thyroid & would like to see a specialist

## 2018-12-16 NOTE — Progress Notes (Signed)
Assessment and Plan: Patient is on biotin, will get her to stop x 2 weeks and then check her labs  Hair loss ? From stress will check labs -     Zinc; Future -     T4, free; Future  Anemia, unspecified type -     Iron,Total/Total Iron Binding Cap; Future -     Folate RBC; Future  B12 deficiency -     Vitamin B12; Future  Fatigue, unspecified type ? From anxiety/depression  Will rule out defciency, PTH, thyroid with family and questionable personal history -     CBC with Differential/Platelet; Future -     COMPLETE METABOLIC PANEL WITH GFR; Future -     TSH; Future -     Thyroglobulin antibody; Future -     Thyroid peroxidase antibody; Future -     T4, free; Future  Medication management -     CBC with Differential/Platelet; Future -     COMPLETE METABOLIC PANEL WITH GFR; Future -     Magnesium; Future  Vitamin D deficiency -     VITAMIN D 25 Hydroxy (Vit-D Deficiency, Fractures); Future  CKD (chronic kidney disease) stage 3, GFR 30-59 ml/min (HCC) -     Parathyroid Hormone, Intact w/Ca; Future - abnormal kidney, + kidney stones, fatigue, check level  History of thyroid disease Get off biotin and recheck + family history, questionable personal history -     TSH; Future -     Thyroglobulin antibody; Future -     Thyroid peroxidase antibody; Future -     T4, free; Future     HPI 68 y.o.female with history of anxiety/depression feels that she has hyperthyroidism.  Requested to be referred to a specialist but was informed that we need to see her first to see if that is what is needed. She has a long standing history of anxiety/panic DO.   She states her hair has been coming out for years, states it is "all over" no specific distribution. She complains of having to feel the need to go all time.    She was on thyroid medications in the past, 20+ years but then stopped, she has a family history of thyroid medication with her daughter and sister having thyroid issues,  sister with grave's disease.  She has sweating day and night, no hot flashes.  She has some palpitations. No CP, SOB.  She will have constipation but takes senokot.  She sleeps well at night, and feels rested in the morning.  No AB pain. Some OA pain back.  She has had a kidney stone x 2 before.  She has no weight loss or weight gain.   She is on biotin.   Lab Results  Component Value Date   TSH 1.90 10/03/2018   Lab Results  Component Value Date   [id number] 725 06/30/2016   BMI is Body mass index is 24.59 kg/m., she is working on diet and exercise. Wt Readings from Last 3 Encounters:  12/18/18 138 lb 12.8 oz (63 kg)  12/04/18 137 lb (62.1 kg)  10/03/18 136 lb 9.6 oz (62 kg)      Past Medical History:  Diagnosis Date  . Anxiety and depression   . Arthritis   . Colon polyps   . GERD (gastroesophageal reflux disease)   . Hemorrhoids   . Hiatal hernia   . History of kidney stones    passed  . Hyperlipemia   . Mixed hyperlipidemia 11/26/2013  . Panic  disorder   . Passive smoke exposure 07/17/2018  . Renal insufficiency    stage 3 kidney disease per her PCP  . Stomach ulcer   . Unspecified essential hypertension 11/26/2013     Allergies  Allergen Reactions  . Other     All pain medications: Unknown/ CODEINE  . Prednisone Other (See Comments)    DYSPHORIA  . Vioxx [Rofecoxib]     unknown  . Entex T [Pseudoephedrine-Guaifenesin] Palpitations  . Levaquin [Levofloxacin] Rash    Tolerated Avelox without adverse effect 11/08/15.  . Sulfa Antibiotics Rash  . Trovan [Alatrofloxacin] Rash    Current Outpatient Medications on File Prior to Visit  Medication Sig  . aspirin 81 MG tablet Take 81 mg by mouth at bedtime.   . Biotin 10000 MCG TABS Take by mouth daily.  . cholecalciferol (VITAMIN D) 1000 units tablet Take 1,000 Units by mouth daily.  . clonazePAM (KLONOPIN) 0.5 MG tablet Take 1 tablet (0.5 mg total) by mouth 2 (two) times daily as needed for anxiety (limit  the amount, medication is addictive).  Marland Kitchen diltiazem (CARDIZEM) 120 MG tablet Take 1 tablet 2 x /day  For BP  . famotidine (PEPCID) 20 MG tablet Take 2 tablets (40 mg total) by mouth 2 (two) times daily. (Patient taking differently: Take 40 mg by mouth 2 (two) times daily. Takes one tablet twice a day)  . fexofenadine (ALLEGRA) 180 MG tablet Take 1 tablet (180 mg total) by mouth daily.  . furosemide (LASIX) 20 MG tablet Take 1 tablet (20 mg total) by mouth daily as needed for edema.  . levalbuterol (XOPENEX) 1.25 MG/3ML nebulizer solution Take 1.25 mg by nebulization every 8 (eight) hours as needed for wheezing or shortness of breath.  . potassium chloride SA (K-DUR,KLOR-CON) 20 MEQ tablet Take 1 tablet (20 mEq total) by mouth daily as needed. When taking lasix.  Marland Kitchen pravastatin (PRAVACHOL) 40 MG tablet TAKE 1 TABLET BY MOUTH EVERY DAY  . predniSONE (DELTASONE) 5 MG tablet Take 1 tab daily for 1 week.  . promethazine-dextromethorphan (PROMETHAZINE-DM) 6.25-15 MG/5ML syrup Take 5 mLs by mouth 4 (four) times daily as needed for cough.  . sertraline (ZOLOFT) 100 MG tablet Take 1.5 tablets (150 mg total) by mouth daily. (Patient taking differently: Take 150 mg by mouth daily. Take one tablet three days a week and 1.5 all other days)   No current facility-administered medications on file prior to visit.     ROS: all negative except above.   Physical Exam: Filed Weights   12/18/18 1033  Weight: 138 lb 12.8 oz (63 kg)   Ht 5\' 3"  (1.6 m)   Wt 138 lb 12.8 oz (63 kg)   BMI 24.59 kg/m  General Appearance: Well nourished, in no apparent distress. Eyes: PERRLA, EOMs, conjunctiva no swelling or erythema Sinuses: No Frontal/maxillary tenderness ENT/Mouth: Ext aud canals clear, TMs without erythema, bulging. No erythema, swelling, or exudate on post pharynx.  Tonsils not swollen or erythematous. Hearing normal.  Neck: Supple, thyroid normal.  Respiratory: Respiratory effort normal, BS equal bilaterally  without rales, rhonchi, wheezing or stridor.  Cardio: RRR with no MRGs. Brisk peripheral pulses without edema.  Abdomen: Soft, + BS.  Non tender, no guarding, rebound, hernias, masses. Lymphatics: Non tender without lymphadenopathy.  Musculoskeletal: Full ROM, 5/5 strength, normal gait.  Skin: Warm, dry without rashes, lesions, ecchymosis.  Neuro: Cranial nerves intact. Normal muscle tone, no cerebellar symptoms. Sensation intact.  Psych: Awake and oriented X 3, normal affect, Insight and  Judgment appropriate.     [name], PA-C 10:39 AM [facility]

## 2018-12-18 ENCOUNTER — Encounter: Payer: Self-pay | Admitting: Physician Assistant

## 2018-12-18 ENCOUNTER — Other Ambulatory Visit: Payer: Self-pay

## 2018-12-18 ENCOUNTER — Ambulatory Visit (INDEPENDENT_AMBULATORY_CARE_PROVIDER_SITE_OTHER): Payer: PPO | Admitting: Physician Assistant

## 2018-12-18 VITALS — BP 126/74 | HR 67 | Temp 97.6°F | Ht 63.0 in | Wt 138.8 lb

## 2018-12-18 DIAGNOSIS — E559 Vitamin D deficiency, unspecified: Secondary | ICD-10-CM | POA: Diagnosis not present

## 2018-12-18 DIAGNOSIS — E538 Deficiency of other specified B group vitamins: Secondary | ICD-10-CM

## 2018-12-18 DIAGNOSIS — L659 Nonscarring hair loss, unspecified: Secondary | ICD-10-CM

## 2018-12-18 DIAGNOSIS — R5383 Other fatigue: Secondary | ICD-10-CM

## 2018-12-18 DIAGNOSIS — Z79899 Other long term (current) drug therapy: Secondary | ICD-10-CM

## 2018-12-18 DIAGNOSIS — N183 Chronic kidney disease, stage 3 unspecified: Secondary | ICD-10-CM

## 2018-12-18 DIAGNOSIS — D649 Anemia, unspecified: Secondary | ICD-10-CM

## 2018-12-18 DIAGNOSIS — Z8639 Personal history of other endocrine, nutritional and metabolic disease: Secondary | ICD-10-CM

## 2018-12-18 NOTE — Patient Instructions (Signed)
Stop the biotin x 2 weeks  Schedule LAB only to recheck labs    Hyperthyroidism  Hyperthyroidism is when the thyroid gland is too active (overactive). The thyroid gland is a small gland located in the lower front part of the neck, just in front of the windpipe (trachea). This gland makes hormones that help control how the body uses food for energy (metabolism) as well as how the heart and brain function. These hormones also play a role in keeping your bones strong. When the thyroid is overactive, it produces too much of a hormone called thyroxine. What are the causes? This condition may be caused by:  Graves' disease. This is a disorder in which the body's disease-fighting system (immune system) attacks the thyroid gland. This is the most common cause.  Inflammation of the thyroid gland.  A tumor in the thyroid gland.  Use of certain medicines, including: ? Prescription thyroid hormone replacement. ? Herbal supplements that mimic thyroid hormones. ? Amiodarone therapy.  Solid or fluid-filled lumps within your thyroid gland (thyroid nodules).  Taking in a large amount of iodine from foods or medicines. What increases the risk? You are more likely to develop this condition if:  You are female.  You have a family history of thyroid conditions.  You smoke tobacco.  You use a medicine called lithium.  You take medicines that affect the immune system (immunosuppressants). What are the signs or symptoms? Symptoms of this condition include:  Nervousness.  Inability to tolerate heat.  Unexplained weight loss.  Diarrhea.  Change in the texture of hair or skin.  Heart skipping beats or making extra beats.  Rapid heart rate.  Loss of menstruation.  Shaky hands.  Fatigue.  Restlessness.  Sleep problems.  Enlarged thyroid gland or a lump in the thyroid (nodule). You may also have symptoms of Graves' disease, which may include:  Protruding eyes.  Dry eyes.  Red  or swollen eyes.  Problems with vision. How is this diagnosed? This condition may be diagnosed based on:  Your symptoms and medical history.  A physical exam.  Blood tests.  Thyroid ultrasound. This test involves using sound waves to produce images of the thyroid gland.  A thyroid scan. A radioactive substance is injected into a vein, and images show how much iodine is present in the thyroid.  Radioactive iodine uptake test (RAIU). A small amount of radioactive iodine is given by mouth to see how much iodine the thyroid absorbs after a certain amount of time. How is this treated? Treatment depends on the cause and severity of the condition. Treatment may include:  Medicines to reduce the amount of thyroid hormone your body makes.  Radioactive iodine treatment (radioiodine therapy). This involves swallowing a small dose of radioactive iodine, in capsule or liquid form, to kill thyroid cells.  Surgery to remove part or all of your thyroid gland. You may need to take thyroid hormone replacement medicine for the rest of your life after thyroid surgery.  Medicines to help manage your symptoms. Follow these instructions at home:   Take over-the-counter and prescription medicines only as told by your health care provider.  Do not use any products that contain nicotine or tobacco, such as cigarettes and e-cigarettes. If you need help quitting, ask your health care provider.  Follow any instructions from your health care provider about diet. You may be instructed to limit foods that contain iodine.  Keep all follow-up visits as told by your health care provider. This is important. ?  You will need to have blood tests regularly so that your health care provider can monitor your condition. Contact a health care provider if:  Your symptoms do not get better with treatment.  You have a fever.  You are taking thyroid hormone replacement medicine and you: ? Have symptoms of depression.  ? Feel like you are tired all the time. ? Gain weight. Get help right away if:  You have chest pain.  You have decreased alertness or a change in your awareness.  You have abdominal pain.  You feel dizzy.  You have a rapid heartbeat.  You have an irregular heartbeat.  You have difficulty breathing. Summary  The thyroid gland is a small gland located in the lower front part of the neck, just in front of the windpipe (trachea).  Hyperthyroidism is when the thyroid gland is too active (overactive) and produces too much of a hormone called thyroxine.  The most common cause is Graves' disease, a disorder in which your immune system attacks the thyroid gland.  Hyperthyroidism can cause various symptoms, such as unexplained weight loss, nervousness, inability to tolerate heat, or changes in your heartbeat.  Treatment may include medicine to reduce the amount of thyroid hormone your body makes, radioiodine therapy, surgery, or medicines to manage symptoms. This information is not intended to replace advice given to you by your health care provider. Make sure you discuss any questions you have with your health care provider. Document Released: 08/21/2005 Document Revised: 08/01/2017 Document Reviewed: 08/01/2017 Elsevier Interactive Patient Education  2019 [employer].

## 2018-12-20 ENCOUNTER — Ambulatory Visit: Payer: Self-pay

## 2018-12-20 ENCOUNTER — Other Ambulatory Visit: Payer: Self-pay

## 2018-12-23 ENCOUNTER — Telehealth: Payer: Self-pay | Admitting: Physician Assistant

## 2018-12-23 MED ORDER — TRIAMCINOLONE ACETONIDE 55 MCG/ACT NA AERO: 2.0000 | INHALATION_SPRAY | Freq: Every day | NASAL | 3 refills | Status: DC

## 2018-12-23 NOTE — Telephone Encounter (Signed)
The patient has been notified of this information and all questions answered.

## 2018-12-23 NOTE — Telephone Encounter (Signed)
-----   Message from [name], [place] sent at 12/23/2018  2:08 PM EDT ----- Regarding: MED REQUEST Contact: [phone] PATIENT feels like she is having an allergy "flare up".  Sxs include: nasal drainage, stuffy head & ears.  Has been taking mucinex for 2wks now.  Patient would like something called into her pharmacy to treat her sxs.  Pharmacy:  [facility] on [address].

## 2018-12-23 NOTE — Telephone Encounter (Signed)
Get on allergy pill and I will send in nasocort for her to take. If not fever, chills, sinus pain no need for ABX, just allergies.   Take 2 puffs at night each nostril  See if she would be willing to take decadron pill if that does not make her happy, cousin to prednisone.

## 2018-12-24 MED ORDER — DEXAMETHASONE 0.5 MG PO TABS: ORAL_TABLET | ORAL | 0 refills | Status: DC

## 2018-12-24 NOTE — Addendum Note (Signed)
Addended by: [name] R on: 12/24/2018 03:11 PM   Modules accepted: Orders

## 2018-12-24 NOTE — Telephone Encounter (Signed)
After speaking with Latoya [name] she would like DECADRON sent into her pharmacy

## 2018-12-25 ENCOUNTER — Telehealth: Payer: Self-pay

## 2018-12-25 ENCOUNTER — Other Ambulatory Visit: Payer: Self-pay

## 2018-12-25 MED ORDER — DEXAMETHASONE 0.5 MG PO TABS: ORAL_TABLET | ORAL | 0 refills | Status: DC

## 2018-12-25 NOTE — Telephone Encounter (Signed)
-----   Message from [name], [place] sent at 12/25/2018  8:23 AM EDT ----- Regarding: RE: pharmacy call Change to 1 pill daily for 10 days but let patient now to just take for 5 days.  Estill Bamberg ----- Message ----- From: [name], CMA Sent: 12/24/2018   3:26 PM EDT To: [name], PA-C Subject: pharmacy call                                  PHARMACY called to report the direction for the DECADRON are to take 1 tablet every day for 5   days but the pharmacy states that either the direction need to be changed or we sent in too many   tablets for 5 days.   10 tablets were sent in to pharmacy.  Please advise

## 2018-12-25 NOTE — Telephone Encounter (Signed)
Patient has been made aware to ONLY take 1 tablet for 5 days & NOT to take the tablets for 10 days like the bottle will say. Patient voiced understanding & agreed. April 22nd 2020

## 2018-12-25 NOTE — Telephone Encounter (Signed)
Patient has been informed to take 1 tablet for 5 days & NOT to take it for 10 days as the bottle would say. Patient voiced understanding & agreed to the instruction

## 2019-01-01 ENCOUNTER — Other Ambulatory Visit: Payer: PPO

## 2019-01-01 ENCOUNTER — Other Ambulatory Visit: Payer: Self-pay

## 2019-01-01 DIAGNOSIS — R5383 Other fatigue: Secondary | ICD-10-CM | POA: Diagnosis not present

## 2019-01-01 DIAGNOSIS — Z8639 Personal history of other endocrine, nutritional and metabolic disease: Secondary | ICD-10-CM

## 2019-01-01 DIAGNOSIS — D649 Anemia, unspecified: Secondary | ICD-10-CM

## 2019-01-01 DIAGNOSIS — N183 Chronic kidney disease, stage 3 unspecified: Secondary | ICD-10-CM

## 2019-01-01 DIAGNOSIS — L659 Nonscarring hair loss, unspecified: Secondary | ICD-10-CM

## 2019-01-01 DIAGNOSIS — E538 Deficiency of other specified B group vitamins: Secondary | ICD-10-CM

## 2019-01-01 DIAGNOSIS — E559 Vitamin D deficiency, unspecified: Secondary | ICD-10-CM

## 2019-01-01 DIAGNOSIS — Z79899 Other long term (current) drug therapy: Secondary | ICD-10-CM

## 2019-01-04 LAB — COMPLETE METABOLIC PANEL WITH GFR
AG Ratio: 1.7 (calc) (ref 1.0–2.5)
ALT: 17 U/L (ref 6–29)
AST: 26 U/L (ref 10–35)
Albumin: 4.1 g/dL (ref 3.6–5.1)
Alkaline phosphatase (APISO): 76 U/L (ref 37–153)
BUN: 18 mg/dL (ref 7–25)
CO2: 26 mmol/L (ref 20–32)
Calcium: 9.2 mg/dL (ref 8.6–10.4)
Chloride: 108 mmol/L (ref 98–110)
Creat: 0.96 mg/dL (ref 0.50–0.99)
GFR, Est African American: 70 mL/min/{1.73_m2} (ref >=60)
GFR, Est Non African American: 61 mL/min/{1.73_m2} (ref >=60)
Globulin: 2.4 g/dL (calc) (ref 1.9–3.7)
Glucose, Bld: 79 mg/dL (ref 65–99)
Potassium: 3.8 mmol/L (ref 3.5–5.3)
Sodium: 142 mmol/L (ref 135–146)
Total Bilirubin: 0.3 mg/dL (ref 0.2–1.2)
Total Protein: 6.5 g/dL (ref 6.1–8.1)

## 2019-01-04 LAB — IRON,?TOTAL/TOTAL IRON BINDING CAP: %SAT: 19 % (calc) (ref 16–45)

## 2019-01-04 LAB — IRON, TOTAL/TOTAL IRON BINDING CAP
Iron: 73 ug/dL (ref 45–160)
TIBC: 375 mcg/dL (calc) (ref 250–450)

## 2019-01-04 LAB — CBC WITH DIFFERENTIAL/PLATELET
Absolute Monocytes: 648 cells/uL (ref 200–950)
Basophils Absolute: 43 cells/uL (ref 0–200)
Basophils Relative: 0.6 %
Eosinophils Absolute: 137 cells/uL (ref 15–500)
Eosinophils Relative: 1.9 %
HCT: 40.8 % (ref 35.0–45.0)
Hemoglobin: 13.6 g/dL (ref 11.7–15.5)
Lymphs Abs: 2167 cells/uL (ref 850–3900)
MCH: 30.2 pg (ref 27.0–33.0)
MCHC: 33.3 g/dL (ref 32.0–36.0)
MCV: 90.7 fL (ref 80.0–100.0)
MPV: 9.8 fL (ref 7.5–12.5)
Monocytes Relative: 9 %
Neutro Abs: 4205 cells/uL (ref 1500–7800)
Neutrophils Relative %: 58.4 %
Platelets: 216 10*3/uL (ref 140–400)
RBC: 4.5 10*6/uL (ref 3.80–5.10)
RDW: 13.1 % (ref 11.0–15.0)
Total Lymphocyte: 30.1 %
WBC: 7.2 10*3/uL (ref 3.8–10.8)

## 2019-01-04 LAB — THYROGLOBULIN ANTIBODY: Thyroglobulin Ab: <1 IU/mL (ref <=1)

## 2019-01-04 LAB — T4, FREE: Free T4: 1 ng/dL (ref 0.8–1.8)

## 2019-01-04 LAB — PTH, INTACT AND CALCIUM
Calcium: 9.2 mg/dL (ref 8.6–10.4)
PTH: 57 pg/mL (ref 14–64)

## 2019-01-04 LAB — TSH: TSH: 3.25 mIU/L (ref 0.40–4.50)

## 2019-01-04 LAB — THYROID PEROXIDASE ANTIBODY: Thyroperoxidase Ab SerPl-aCnc: <1 IU/mL (ref <9)

## 2019-01-04 LAB — VITAMIN D 25 HYDROXY (VIT D DEFICIENCY, FRACTURES): Vit D, 25-Hydroxy: 58 ng/mL (ref 30–100)

## 2019-01-04 LAB — VITAMIN B12: Vitamin B-12: 338 pg/mL (ref 200–1100)

## 2019-01-04 LAB — FOLATE RBC: RBC Folate: 687 ng/mL RBC (ref >280)

## 2019-01-04 LAB — MAGNESIUM: Magnesium: 2.2 mg/dL (ref 1.5–2.5)

## 2019-01-04 LAB — ZINC: Zinc: 58 ug/dL — ABNORMAL LOW (ref 60–130)

## 2019-01-13 ENCOUNTER — Other Ambulatory Visit: Payer: Self-pay

## 2019-01-13 ENCOUNTER — Ambulatory Visit: Payer: PPO | Admitting: Physician Assistant

## 2019-01-13 ENCOUNTER — Encounter: Payer: Self-pay | Admitting: Physician Assistant

## 2019-01-13 VITALS — HR 70 | Temp 97.4°F | Ht 63.0 in | Wt 135.0 lb

## 2019-01-13 DIAGNOSIS — R5383 Other fatigue: Secondary | ICD-10-CM | POA: Diagnosis not present

## 2019-01-13 DIAGNOSIS — F419 Anxiety disorder, unspecified: Secondary | ICD-10-CM | POA: Diagnosis not present

## 2019-01-13 DIAGNOSIS — L659 Nonscarring hair loss, unspecified: Secondary | ICD-10-CM

## 2019-01-13 DIAGNOSIS — Z8639 Personal history of other endocrine, nutritional and metabolic disease: Secondary | ICD-10-CM

## 2019-01-13 DIAGNOSIS — J0100 Acute maxillary sinusitis, unspecified: Secondary | ICD-10-CM

## 2019-01-13 DIAGNOSIS — F411 Generalized anxiety disorder: Secondary | ICD-10-CM

## 2019-01-13 MED ORDER — PROPRANOLOL HCL 10 MG PO TABS: 10.0000 mg | ORAL_TABLET | Freq: Two times a day (BID) | ORAL | 11 refills | Status: DC | PRN

## 2019-01-13 MED ORDER — AMOXICILLIN-POT CLAVULANATE 875-125 MG PO TABS: 1.0000 | ORAL_TABLET | Freq: Two times a day (BID) | ORAL | 0 refills | Status: AC

## 2019-01-13 MED ORDER — SERTRALINE HCL 100 MG PO TABS: 150.0000 mg | ORAL_TABLET | Freq: Every day | ORAL | 1 refills | Status: DC

## 2019-01-13 NOTE — Progress Notes (Signed)
THIS ENCOUNTER IS A VIRTUAL VISIT DUE TO COVID-19 - PATIENT WAS NOT SEEN IN THE OFFICE.  PATIENT HAS CONSENTED TO VIRTUAL VISIT / TELEMEDICINE VISIT   Virtual Visit via telephone Note  I connected with UNIQUA KIHN on 01/13/2019 01/13/2019  by telephone.  I verified that I am speaking with the correct person using two identifiers.    I discussed the limitations of evaluation and management by telemedicine and the availability of in person appointments. The patient expressed understanding and agreed to proceed.  History of Present Illness: 68 y.o. WF call s with sinus issues and request to go to endocrine.  She states for last 3 days she has had shaking, nervousness and she has had no energy for days. She feels sinus congestion and drainage, HA, ears are stopped up x a week. No fever, chills.  She had normal TSH, T4 last visit but insists on seeing endocrinology.  She also had a low zinc and B12 which is on replacement now.  She did not increase the zoloft to 1.5, she is only on the 1 pill. She sleeps well.   Medications   Current Outpatient Medications (Cardiovascular):  .  diltiazem (CARDIZEM) 120 MG tablet, Take 1 tablet 2 x /day  For BP .  furosemide (LASIX) 20 MG tablet, Take 1 tablet (20 mg total) by mouth daily as needed for edema. .  pravastatin (PRAVACHOL) 40 MG tablet, TAKE 1 TABLET BY MOUTH EVERY DAY  Current Outpatient Medications (Respiratory):  .  fexofenadine (ALLEGRA) 180 MG tablet, Take 1 tablet (180 mg total) by mouth daily. Marland Kitchen  levalbuterol (XOPENEX) 1.25 MG/3ML nebulizer solution, Take 1.25 mg by nebulization every 8 (eight) hours as needed for wheezing or shortness of breath. .  triamcinolone (NASACORT) 55 MCG/ACT AERO nasal inhaler, Place 2 sprays into the nose at bedtime.  Current Outpatient Medications (Analgesics):  .  aspirin 81 MG tablet, Take 81 mg by mouth at bedtime.    Current Outpatient Medications (Other):  .  Biotin 10000 MCG TABS, Take by mouth  daily. .  cholecalciferol (VITAMIN D) 1000 units tablet, Take 1,000 Units by mouth daily. .  clonazePAM (KLONOPIN) 0.5 MG tablet, Take 1 tablet (0.5 mg total) by mouth 2 (two) times daily as needed for anxiety (limit the amount, medication is addictive). .  famotidine (PEPCID) 20 MG tablet, Take 2 tablets (40 mg total) by mouth 2 (two) times daily. (Patient taking differently: Take 40 mg by mouth 2 (two) times daily. Takes one tablet twice a day) .  potassium chloride SA (K-DUR,KLOR-CON) 20 MEQ tablet, Take 1 tablet (20 mEq total) by mouth daily as needed. When taking lasix. Marland Kitchen  sertraline (ZOLOFT) 100 MG tablet, Take 1.5 tablets (150 mg total) by mouth daily.  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   Observations/Objective: General Appearance:Well sounding, in no apparent distress.  ENT/Mouth: No hoarseness, + wet cough for duration of visit., sounds nasal  Respiratory: completing full sentences without distress, without audible wheeze Neuro: Awake and oriented X 3,  Psych:  Insight and Judgment appropriate.    Pulse 70, temperature (!) 97.4 F (36.3 C), height 5\' 3"  (1.6 m), weight 135 lb (61.2 kg), SpO2 98 %.  Assessment and Plan  Hair loss, fatigue, history of thyroid disease Was off biotin, had normal TSH but patient has very high anxiety and is very adamant that she sees someone Will refer with last few notes and labs.  Stay on B12 and zinc Stop biotin 2 weeks prior to labs any visit -     Ambulatory referral to Endocrinology  Anxiety -     propranolol (INDERAL) 10 MG tablet;  Take 1 tablet (10 mg total) by mouth 2 (two) times daily as needed (anxiety). Continue zoloft 100mg  - discussed remeron at night but patient was non addictive more as needed medication, will try short acting propranolol at first, if does well may switch CCB to BB  Acute non-recurrent maxillary sinusitis -     amoxicillin-clavulanate (AUGMENTIN) 875-125 MG tablet; Take 1 tablet by mouth 2 (two) times daily for 7 days.    Follow Up Instructions:  I discussed the assessment and treatment plan with the patient. The patient was provided an opportunity to ask questions and all were answered. The patient agreed with the plan and demonstrated an understanding of the instructions.   The patient was advised to call back or seek an in-person evaluation if the symptoms worsen or if the condition fails to improve as anticipated.  I provided 15 minutes of non-face-to-face time during this encounter.   [name], PA-C

## 2019-01-21 ENCOUNTER — Other Ambulatory Visit: Payer: Self-pay

## 2019-01-21 ENCOUNTER — Other Ambulatory Visit: Payer: Self-pay | Admitting: Internal Medicine

## 2019-01-21 MED ORDER — FUROSEMIDE 20 MG PO TABS: 20.0000 mg | ORAL_TABLET | Freq: Every day | ORAL | 1 refills | Status: DC | PRN

## 2019-01-21 NOTE — Telephone Encounter (Signed)
ME REFILL

## 2019-01-28 ENCOUNTER — Encounter: Payer: Self-pay | Admitting: Internal Medicine

## 2019-01-28 NOTE — Patient Instructions (Signed)

## 2019-01-28 NOTE — Progress Notes (Signed)
THIS ENCOUNTER IS A VIRTUAL VISIT DUE TO COVID-19 - PATIENT WAS NOT SEEN IN THE OFFICE.  PATIENT HAS CONSENTED TO VIRTUAL VISIT / TELEMEDICINE VISIT  This provider placed a call to Latoya [name] using telephone, her appointment was changed to a virtual office visit to reduce the risk of exposure to the COVID-19 virus and to help [employer] remain healthy and safe. The virtual visit will also provide continuity of care. She verbalizes understanding.   Virtual Visit via telephone Note  I connected with  Latoya [name]  on 01/30/19  by telephone.  I verified that I am speaking with the correct person using two identifiers.        I discussed the limitations of evaluation and management by telemedicine and the availability of in person appointments. The patient expressed understanding and agreed to proceed.  History of Present Illness:      This very nice 68 y.o. MWF presents for 3 month follow up with HTN, HLD, Pre-Diabetes and Vitamin D Deficiency. Patient has been evaluated recently for c/o hair loss with an extensive w/u finding only sl low Zinc level of 58. She has apparently insisted on Endocrine referral.       Patient is treated for HTN circa 1985  & BP has been controlled at home.  She has CKD3 felt consequent of her HTN. Today's BP is at goal - 119/79. Patient has had no complaints of any cardiac type chest pain, palpitations, dyspnea / orthopnea / PND, dizziness, claudication, or dependent edema. Patient has hx/o Chronic Anxiety & Depression & has been followed by Dr Casimiro Needle in the past.       Hyperlipidemia is controlled with diet & meds. Patient denies myalgias or other med SE's. Last Lipids were  Lab Results  Component Value Date   CHOL 164 10/03/2018   HDL 59 10/03/2018   LDLCALC 79 10/03/2018   TRIG 156 (H) 10/03/2018   CHOLHDL 2.8 10/03/2018       Also, the patient has history of  PreDiabetes (A1c 5.8% / 2014 & 5.7% / 2015 w/elevated Insulin 40)  and has had no  symptoms of reactive hypoglycemia, diabetic polys, paresthesias or visual blurring.  Last A1c was Normal & at goal: Lab Results  Component Value Date   HGBA1C 5.5 07/03/2018      Further, the patient also has history of Vitamin D Deficiency and supplements vitamin D without any suspected side-effects. Last vitamin D was near goal of 70-100: Lab Results  Component Value Date   VD25OH 58 01/01/2019   Current Outpatient Medications on File Prior to Visit  Medication Sig  . aspirin 81 MG tablet Take 81 mg by mouth at bedtime.   . Biotin 10000 MCG TABS Take by mouth daily.  . cholecalciferol (VITAMIN D) 1000 units tablet Take 1,000 Units by mouth daily.  Marland Kitchen diltiazem (CARDIZEM) 120 MG tablet Take 1 tablet 2 x /day  For BP  . famotidine (PEPCID) 20 MG tablet 20 mg daily. As needed.  . furosemide (LASIX) 20 MG tablet Take 1 tablet (20 mg total) by mouth daily as needed for edema.  . Mouthwash Compounding Base LIQD SWISH WITH 5 MLS AND SWALLOW EVERY 2 HOURS AS NEEDED  . potassium chloride SA (K-DUR,KLOR-CON) 20 MEQ tablet Take 1 tablet (20 mEq total) by mouth daily as needed. When taking lasix.  Marland Kitchen pravastatin (PRAVACHOL) 40 MG tablet TAKE 1 TABLET BY MOUTH EVERY DAY  . propranolol (INDERAL) 10 MG tablet  Take 1 tablet (10 mg total) by mouth 2 (two) times daily as needed (anxiety).  . sertraline (ZOLOFT) 100 MG tablet 100 mg daily.  Marland Kitchen triamcinolone (NASACORT) 55 MCG/ACT AERO nasal inhaler Place 2 sprays into the nose at bedtime.   No current facility-administered medications on file prior to visit.    Allergies  Allergen Reactions  . Other     All pain medications: Unknown/ CODEINE  . Prednisone Other (See Comments)    DYSPHORIA  . Vioxx [Rofecoxib]     unknown  . Entex T [Pseudoephedrine-Guaifenesin] Palpitations  . Levaquin [Levofloxacin] Rash    Tolerated Avelox without adverse effect 11/08/15.  . Sulfa Antibiotics Rash  . Trovan [Alatrofloxacin] Rash   PMHx:   Past Medical History:   Diagnosis Date  . Anxiety and depression   . Arthritis   . Colon polyps   . GERD (gastroesophageal reflux disease)   . Hemorrhoids   . Hiatal hernia   . History of kidney stones    passed  . Hyperlipemia   . Mixed hyperlipidemia 11/26/2013  . Panic disorder   . Passive smoke exposure 07/17/2018  . Renal insufficiency    stage 3 kidney disease per her PCP  . Stomach ulcer   . Unspecified essential hypertension 11/26/2013   Immunization History  Administered Date(s) Administered  . Influenza Split 06/04/2014, 06/05/2015  . Influenza, High Dose Seasonal PF 05/30/2017, 05/22/2018  . Influenza-Unspecified 06/06/2013, 06/13/2016  . Pneumococcal Conjugate-13 06/05/2015  . Pneumococcal Polysaccharide-23 04/06/2014  . Pneumococcal-Unspecified 09/04/1996  . Td 09/04/2005  . Tdap 03/22/2017   Past Surgical History:  Procedure Laterality Date  . ABDOMINAL HYSTERECTOMY    . CARPOMETACARPEL SUSPENSION PLASTY Right 12/08/2013   Procedure: CARPOMETACARPEL [facility]) SUSPENSION PLASTY;  Surgeon: Jolyn Nap, MD;  Location: [place];  Service: Orthopedics;  Laterality: Right;  . COLONOSCOPY    . FOOT SURGERY Left 2008   Bunion and pulled tendons  . HAND SURGERY Right    Dog Bite  . I&D EXTREMITY Right 01/20/2017   Procedure: RIGHT HAND IRRIGATION AND DEBRIDEMENT AND REPAIR AS INDICATED;  Surgeon: Iran Planas, MD;  Location: [place];  Service: Orthopedics;  Laterality: Right;  . OOPHORECTOMY Right   . TUBAL LIGATION    . UPPER GASTROINTESTINAL ENDOSCOPY     FHx:    Reviewed / unchanged  SHx:    Reviewed / unchanged   Systems Review:  Constitutional: Denies fever, chills, wt changes, headaches, insomnia, fatigue, night sweats, change in appetite. Eyes: Denies redness, blurred vision, diplopia, discharge, itchy, watery eyes.  ENT: Denies discharge, congestion, post nasal drip, epistaxis, sore throat, earache, hearing loss, dental pain, tinnitus, vertigo, sinus pain,  snoring.  CV: Denies chest pain, palpitations, irregular heartbeat, syncope, dyspnea, diaphoresis, orthopnea, PND, claudication or edema. Respiratory: denies cough, dyspnea, DOE, pleurisy, hoarseness, laryngitis, wheezing.  Gastrointestinal: Denies dysphagia, odynophagia, heartburn, reflux, water brash, abdominal pain or cramps, nausea, vomiting, bloating, diarrhea, constipation, hematemesis, melena, hematochezia  or hemorrhoids. Genitourinary: Denies dysuria, frequency, urgency, nocturia, hesitancy, discharge, hematuria or flank pain. Musculoskeletal: Denies arthralgias, myalgias, stiffness, jt. swelling, pain, limping or strain/sprain.  Skin: Denies pruritus, rash, hives, warts, acne, eczema or change in skin lesion(s). Neuro: No weakness, tremor, incoordination, spasms, paresthesia or pain. Psychiatric: Denies confusion, memory loss or sensory loss. Endo: Denies change in weight, skin or hair change.  Heme/Lymph: No excessive bleeding, bruising or enlarged lymph nodes.  Physical Exam  BP 119/79   Pulse 80   Temp (!) 95 F (  35 C)   Wt 135 lb (61.2 kg)   BMI 23.91 kg/m   General : Well sounding patient in no apparent distress HEENT: no hoarseness, no cough for duration of visit Lungs: speaks in complete sentences, no audible wheezing, no apparent distress Neurological: alert, oriented x 3 Psychiatric: pleasant, judgement appropriate   Assessment and Plan:  1. Essential hypertension  - Continue medication, monitor blood pressure at home.  - Continue DASH diet.  Reminder to go to the ER if any CP,  SOB, nausea, dizziness, severe HA, changes vision/speech.  2. Hyperlipidemia, mixed  - Continue diet/meds, exercise,& lifestyle modifications.  - Continue monitor periodic cholesterol/liver & renal functions   3. Abnormal glucose  - Continue diet, exercise  - Lifestyle modifications.  - Monitor appropriate labs.  4. Vitamin D deficiency  - Continue supplementation.        Discussed  regular exercise, BP monitoring, weight control to achieve/maintain BMI less than 25 and discussed med and SE's. Recommended labs to assess and monitor clinical status with further disposition pending results of labs. I discussed the assessment and treatment plan with the patient. The patient was provided an opportunity to ask questions and all were answered. The patient agreed with the plan and demonstrated an understanding of the instructions. I provided 19 minutes of non-face-to-face time during this encounter and over 24 minutes of exam, counseling, chart review and critical decision making was performed        The patient was advised to call back or seek an in-person evaluation if the symptoms worsen or if the condition fails to improve as anticipated.   Kirtland Bouchard, MD

## 2019-01-30 ENCOUNTER — Other Ambulatory Visit: Payer: Self-pay

## 2019-01-30 ENCOUNTER — Ambulatory Visit: Payer: PPO | Admitting: Internal Medicine

## 2019-01-30 ENCOUNTER — Ambulatory Visit: Payer: Self-pay | Admitting: Internal Medicine

## 2019-01-30 VITALS — BP 119/79 | HR 80 | Temp 95.0°F | Resp 12 | Ht 63.0 in | Wt 135.0 lb

## 2019-01-30 DIAGNOSIS — I10 Essential (primary) hypertension: Secondary | ICD-10-CM | POA: Diagnosis not present

## 2019-01-30 DIAGNOSIS — E559 Vitamin D deficiency, unspecified: Secondary | ICD-10-CM | POA: Diagnosis not present

## 2019-01-30 DIAGNOSIS — R7309 Other abnormal glucose: Secondary | ICD-10-CM

## 2019-01-30 DIAGNOSIS — E782 Mixed hyperlipidemia: Secondary | ICD-10-CM | POA: Diagnosis not present

## 2019-02-03 ENCOUNTER — Other Ambulatory Visit: Payer: Self-pay

## 2019-02-03 ENCOUNTER — Ambulatory Visit (INDEPENDENT_AMBULATORY_CARE_PROVIDER_SITE_OTHER): Payer: PPO | Admitting: Internal Medicine

## 2019-02-03 ENCOUNTER — Encounter: Payer: Self-pay | Admitting: Internal Medicine

## 2019-02-03 VITALS — BP 110/70 | HR 70 | Ht 63.0 in | Wt 138.0 lb

## 2019-02-03 DIAGNOSIS — Z8639 Personal history of other endocrine, nutritional and metabolic disease: Secondary | ICD-10-CM | POA: Diagnosis not present

## 2019-02-03 DIAGNOSIS — E041 Nontoxic single thyroid nodule: Secondary | ICD-10-CM | POA: Diagnosis not present

## 2019-02-03 NOTE — Patient Instructions (Signed)
Please schedule another appt in 1 year, but call me 3 weeks beforehand to order the thyroid ultrasound.

## 2019-02-03 NOTE — Progress Notes (Signed)
Patient ID: Latoya [name], female   DOB: Jul 12, 1951, 68 y.o.   MRN: [id number]    HPI  Latoya [name] is a 68 y.o.-year-old female, referred by her PCP, [name], PA-C, for evaluation for thyroid nodule and h/o thyroid ds..  She was told she had thyroid ds. and started on thyroid medications or "many years", then stopped long time ago.    She recently had another thyroid ultrasound that showed  Thyroid U/S (10/23/2018): 4 nodules, of which 1 met criteria for surveillance: COMPARISON:  05/28/2017 Parenchymal Echotexture: Mildly heterogenous Isthmus: 0.3 cm Right lobe: 4.0 cm x 1.7 cm x 1.3 cm Left lobe: 3.7 cm x 1.0 cm x 1.1 cm __________________________________________________  Nodule # 1: Location: Right; Superior Maximum size: 1.2 cm; Other 2 dimensions: 0.8 cm x 0.8 cm. Previous measurement is greatest 1.4 cm Composition: solid/almost completely solid (2) Echogenicity: isoechoic (1) Echogenic foci: punctate echogenic foci (3) Nodule meets criteria for surveillance _________________________________________________________  Nodule # 2: Location: Right; Inferior Maximum size: 0.6 cm; Other 2 dimensions: 0.4 cm x 0.3 cm Composition: spongiform (0) Spongiform nodule does not meet criteria for surveillance or biopsy _______________________________________________________  Nodule # 3: Location: Left; Superior Maximum size: 1.0 cm; Other 2 dimensions: 0.8 cm x 0.4 cm Composition: cystic/almost completely cystic (0) Echogenicity: anechoic (0) Cystic nodule does not meet criteria for surveillance or biopsy _________________________________________________________  Nodule # 4: Location: Left; Inferior Maximum size: 0.6 cm; Other 2 dimensions: 0.4 cm x 0.3 cm Composition: spongiform (0) Spongiform nodule does not meet criteria for surveillance or biopsy ____________________________________________________  No adenopathy  IMPRESSION: Right superior thyroid nodule  (labeled 1) meets criteria for surveillance, as designated by the newly established ACR TI-RADS criteria. Surveillance ultrasound study recommended to be performed annually up to 5 years.  No other thyroid nodule meets criteria for biopsy or surveillance, as designated by the newly established ACR TI-RADS criteria.  Pt denies: - feeling nodules in neck - hoarseness - dysphagia - choking - SOB with lying down  I reviewed pt's thyroid tests: Lab Results  Component Value Date   TSH 3.25 01/01/2019   TSH 1.90 10/03/2018   TSH 2.24 07/03/2018   TSH 1.85 04/01/2018   TSH 2.14 02/19/2018   TSH 1.60 11/27/2017   TSH 1.74 07/30/2017   TSH 2.07 04/26/2017   TSH 2.89 12/20/2016   TSH 2.18 06/30/2016   FREET4 1.0 01/01/2019    Her thyroid antibodies were not elevated: Component     Latest Ref Rng & Units 01/01/2019  Thyroperoxidase Ab SerPl-aCnc     <9 IU/mL <1  Thyroglobulin Ab     < or = 1 IU/mL <1   Of note, she is on 10,000 mcg Biotin daily. She was off for 2 weeks before the latest thyroid tests.  Pt complains of:  - + fatigue, but she is usually very active - + hair loss - started >1 year ago - + irritability - + heat intolerance + sweating - more lately - + occasionally  tremors - + occasionally palpitations - + both anxiety/depression - chronic - no hyperdefecation/+ constipation - no signif. weight loss/weight gain - + dry skin - chronic  No FH of thyroid ds.: daughter with hypothyroidism. No FH of thyroid cancer. No h/o radiation tx to head or neck.  No seaweed or kelp. No recent contrast studies. No steroid use. No herbal supplements.   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  ROS: Constitutional: + See HPI, + nocturia Eyes: no blurry vision, no xerophthalmia ENT: no sore throat,  + see HPI, + tinnitus, + decreased hearing Cardiovascular: + All: CP/SOB/palpitations/leg swelling Respiratory: + Both:  Cough/SOB Gastrointestinal: no N/V/D/ + occasional C/+ heartburn Musculoskeletal: + Both muscle/joint aches Skin: no rashes, + hair loss, + easy bruising Neurological: + Occasional tremors/no numbness/tingling/dizziness, + occasional headaches Psychiatric: + Both: Depression/anxiety  Past Medical History:  Diagnosis Date  . Anxiety and depression   . Arthritis   . Colon polyps   . GERD (gastroesophageal reflux disease)   . Hemorrhoids   . Hiatal hernia   . History of kidney stones    passed  . Hyperlipemia   . Mixed hyperlipidemia 11/26/2013  . Panic disorder   . Passive smoke exposure 07/17/2018  . Renal insufficiency    stage 3 kidney disease per her PCP  . Stomach ulcer   . Unspecified essential hypertension 11/26/2013   Past Surgical History:  Procedure Laterality Date  . ABDOMINAL HYSTERECTOMY    . CARPOMETACARPEL SUSPENSION PLASTY Right 12/08/2013   Procedure: CARPOMETACARPEL [facility]) SUSPENSION PLASTY;  Surgeon: Jolyn Nap, MD;  Location: [place];  Service: Orthopedics;  Laterality: Right;  . COLONOSCOPY    . FOOT SURGERY Left 2008   Bunion and pulled tendons  . HAND SURGERY Right    Dog Bite  . I&D EXTREMITY Right 01/20/2017   Procedure: RIGHT HAND IRRIGATION AND DEBRIDEMENT AND REPAIR AS INDICATED;  Surgeon: Iran Planas, MD;  Location: [place];  Service: Orthopedics;  Laterality: Right;  . OOPHORECTOMY Right   . TUBAL LIGATION    . UPPER GASTROINTESTINAL ENDOSCOPY     Social History   Socioeconomic History  . Marital status: Married    Spouse name: Not on file  . Number of children: 2  . Years of education: Not on file  . Highest education level: Not on file  Occupational History  . Occupation: Housewife  Social Needs  . Financial resource strain: Not on file  . Food insecurity:    Worry: Not on file    Inability: Not on file  . Transportation needs:    Medical: Not on file    Non-medical: Not on file  Tobacco Use  . Smoking  status: Passive Smoke Exposure - Never Smoker  . Smokeless tobacco: Never Used  . Tobacco comment: Husband & Father smoked  Substance and Sexual Activity  . Alcohol use: No    Alcohol/week: 0.0 standard drinks  . Drug use: No  . Sexual activity: Yes    Partners: Male  Lifestyle  . Physical activity:    Days per week: 7 days    Minutes per session: 30 min  . Stress: To some extent  Relationships  . Social connections:    Talks on phone: Not on file    Gets together: Not on file    Attends religious service: Not on file    Active member of club or organization: Not on file    Attends meetings of clubs or organizations: Not on file    Relationship status: Not on file  . Intimate partner violence:    Fear of current or ex partner: Not on file    Emotionally abused: Not on file    Physically abused: Not on file    Forced sexual activity: Not on file  Other Topics Concern  . Not on file  Social History Narrative   Originally from [place].  Has always lived in [place]. Previously has traveled to [place]. No international travel. Enjoys planting gardens and working in her flowers. 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 No bird, mold, or hot tub exposure.    Current Outpatient Medications on File Prior to Visit  Medication Sig Dispense Refill  . aspirin 81 MG tablet Take 81 mg by mouth at bedtime.     . Biotin 10000 MCG TABS Take by mouth daily.    . cholecalciferol (VITAMIN D) 1000 units tablet Take 1,000 Units by mouth daily.    . clonazePAM (KLONOPIN) 1 MG tablet clonazepam 1 mg tablet    . diltiazem (CARDIZEM) 120 MG tablet Take 1 tablet 2 x /day  For BP 180 tablet 1  . famotidine (PEPCID) 20 MG tablet 20 mg daily. As needed.    . furosemide (LASIX) 20 MG tablet Take 1 tablet (20 mg total) by mouth daily as needed for edema. 90 tablet 1  . Mouthwash Compounding Base LIQD SWISH WITH 5 MLS AND SWALLOW EVERY 2 HOURS AS NEEDED 240 mL 0  . potassium chloride SA (K-DUR,KLOR-CON) 20  MEQ tablet Take 1 tablet (20 mEq total) by mouth daily as needed. When taking lasix. 90 tablet 1  . pravastatin (PRAVACHOL) 40 MG tablet TAKE 1 TABLET BY MOUTH EVERY DAY 90 tablet 1  . propranolol (INDERAL) 10 MG tablet Take 1 tablet (10 mg total) by mouth 2 (two) times daily as needed (anxiety). 60 tablet 11  . sertraline (ZOLOFT) 100 MG tablet 100 mg daily.    Marland Kitchen triamcinolone (NASACORT) 55 MCG/ACT AERO nasal inhaler Place 2 sprays into the nose at bedtime. 1 Inhaler 3   No current facility-administered medications on file prior to visit.    Allergies  Allergen Reactions  . Other     All pain medications: Unknown/ CODEINE  . Prednisone Other (See Comments)    DYSPHORIA  . Vioxx [Rofecoxib]     unknown  . Entex T [Pseudoephedrine-Guaifenesin] Palpitations  . Levaquin [Levofloxacin] Rash    Tolerated Avelox without adverse effect 11/08/15.  . Sulfa Antibiotics Rash  . Trovan [Alatrofloxacin] Rash   Family History  Problem Relation Age of Onset  . Heart disease Brother        x2  . Pancreatic cancer Brother   . Dementia Brother   . Heart attack Brother        40s-50s, was on drugs  . Heart disease Father   . Asthma Mother   . Colon cancer Neg Hx   . Stomach cancer Neg Hx   . Breast cancer Neg Hx     PE: BP 110/70   Pulse 70   Ht '5\' 3"'  (1.6 m) Comment: measured  Wt 138 lb (62.6 kg)   SpO2 97%   BMI 24.45 kg/m  Wt Readings from Last 3 Encounters:  01/30/19 135 lb (61.2 kg)  01/13/19 135 lb (61.2 kg)  12/18/18 138 lb 12.8 oz (63 kg)   Constitutional:  Normal weight, in NAD Eyes: PERRLA, EOMI, no exophthalmos ENT: moist mucous membranes, no thyromegaly, no cervical lymphadenopathy Cardiovascular: RRR, No MRG Respiratory: CTA B Gastrointestinal: abdomen soft, NT, ND, BS+ Musculoskeletal: no deformities, strength intact in all 4;  Skin: moist, warm, no rashes Neurological: no tremor with outstretched hands, DTR normal in all 4  ASSESSMENT: 1. Thyroid nodules  2.  H/o thyroid ds.  PLAN: 1. Thyroid nodule - I reviewed the images and the report of her 10/2018 thyroid ultrasound along  with the patient. I pointed out that the nodules are small, w/o a high risk for cancer.  They are:: - not hypoechoic except the cysts (which are very low risk for cancer) - without microcalcifications (although the dominant nodule has small hyperdensities which can be colloid granules, though) - without internal blood flow - more wide than tall  - well delimited from surrounding tissue Pt does not have a thyroid cancer family history or a personal history of RxTx to head/neck. All these would favor benignity.  -Patient also denies neck compression symptoms -In this case, we discussed that we need to repeat a thyroid ultrasound in a year mostly to follow the dominant nodule that she is larger than 1 cm.  However, this nodule is isoechoic with peripheral blood flow, the concern is mostly because of the possible microcalcifications. -I advised her to get in touch with me 3 weeks before she comes for the next appointment so I can order the ultrasound and we can review it at next visit. - I advised pt to join my chart  - RTC in 1 year  2. H/o thyroid ds. - Unclear what type of thyroid disease - she remembers that she has been on thyroid med in the past, but she is now off for many years - Recently checked TFTs were normal, as were her thyroid antibodies.  Of note, she is on high-dose biotin, but she was obvious for 2 weeks before the above results returned. 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

## 2019-02-07 ENCOUNTER — Ambulatory Visit: Payer: Self-pay

## 2019-02-07 ENCOUNTER — Other Ambulatory Visit: Payer: Self-pay

## 2019-02-22 ENCOUNTER — Other Ambulatory Visit: Payer: Self-pay | Admitting: Internal Medicine

## 2019-02-24 ENCOUNTER — Other Ambulatory Visit: Payer: Self-pay | Admitting: Internal Medicine

## 2019-03-10 ENCOUNTER — Telehealth: Payer: Self-pay | Admitting: *Deleted

## 2019-03-10 NOTE — Telephone Encounter (Signed)
Patient called and reported she is having ear pain and a head cold.  Per Dr Melford Aase, she can try an OTC decongestant.  The patient is aware.

## 2019-03-26 ENCOUNTER — Other Ambulatory Visit: Payer: Self-pay | Admitting: *Deleted

## 2019-03-26 MED ORDER — MOUTHWASH COMPOUNDING BASE PO LIQD: 5.0000 mL | ORAL | 0 refills | Status: DC | PRN

## 2019-03-28 ENCOUNTER — Other Ambulatory Visit: Payer: Self-pay | Admitting: Adult Health Nurse Practitioner

## 2019-04-10 ENCOUNTER — Other Ambulatory Visit: Payer: Self-pay | Admitting: Adult Health

## 2019-04-10 DIAGNOSIS — E2839 Other primary ovarian failure: Secondary | ICD-10-CM

## 2019-04-15 ENCOUNTER — Other Ambulatory Visit: Payer: Self-pay

## 2019-04-15 ENCOUNTER — Ambulatory Visit (INDEPENDENT_AMBULATORY_CARE_PROVIDER_SITE_OTHER): Payer: PPO | Admitting: Adult Health Nurse Practitioner

## 2019-04-15 ENCOUNTER — Encounter: Payer: Self-pay | Admitting: Adult Health Nurse Practitioner

## 2019-04-15 VITALS — BP 130/74 | HR 89 | Temp 97.5°F | Ht 63.0 in | Wt 136.8 lb

## 2019-04-15 DIAGNOSIS — B3731 Acute candidiasis of vulva and vagina: Secondary | ICD-10-CM

## 2019-04-15 DIAGNOSIS — B373 Candidiasis of vulva and vagina: Secondary | ICD-10-CM

## 2019-04-15 DIAGNOSIS — R300 Dysuria: Secondary | ICD-10-CM | POA: Diagnosis not present

## 2019-04-15 MED ORDER — CEFUROXIME AXETIL 250 MG PO TABS: 250.0000 mg | ORAL_TABLET | Freq: Two times a day (BID) | ORAL | 0 refills | Status: DC

## 2019-04-15 MED ORDER — FLUCONAZOLE 150 MG PO TABS: ORAL_TABLET | ORAL | 0 refills | Status: DC

## 2019-04-15 NOTE — Progress Notes (Signed)
Addendum: Patient contact 8/13-spoke with patient via telephone. Reports mild improvement in symptoms but   she is still having pain on right side that is severe and constant.  Recomendation for hospital evaluation.  Patient does not want to do this related to her husband and concern of getting him sick.  Again discussed emergent symptoms and recommendations.    -K.Anthon Harpole, NP 04/17/19  1500   Assessment and Plan:  Rily was seen today for pelvic pain.  Diagnoses and all orders for this visit:  Dysuria -     Urinalysis w microscopic + reflex cultur -     cefUROXime (CEFTIN) 250 MG tablet; Take 1 tablet (250 mg total) by mouth 2 (two) times daily. Increase water intake OTC AZO UTI versus OAB versus vaginal dryness - will check UA, C&S. Will start ABX now due to pain  Vaginal candida -     fluconazole (DIFLUCAN) 150 MG tablet; Take one tablet at onset of symptoms and second tablet three days later for yeast.  Other orders -     REFLEXIVE URINE CULTURE  Discussed hospital precautions should pain increase without resolve.  Patient is agreeable to plan of care.  Further disposition pending results of labs. Discussed med's effects and SE's.   Over 30 minutes of exam, counseling, chart review, and critical decision making was performed.   Future Appointments  Date Time Provider [place]  04/15/2019 10:30 AM Garnet Sierras, NP [facility] None  05/06/2019 10:45 AM [name], PA-C [facility] None  06/23/2019  2:30 PM [facility] MM 3 [facility]  06/23/2019  3:00 PM [facility] DX DEXA 1 [facility]  08/07/2019  9:00 AM [name], NP [facility] None  11/06/2019 10:00 AM [name], NP [facility] None  02/03/2020 11:00 AM [name], MD [facility] None    ------------------------------------------------------------------------------------------------------------------   HPI 68 y.o.female presents for urinary symptoms.  She is having  abdominal pain that started one day ago.  She reports vaginal pressure that is constant along with 10/10 pain.  Last night she tylenol to help with the pain.  She reports she has not been drinking as much water as she should, she tries to drink 4-5 16oz bottles a day but has not been meeting this.  She endorses dysuria and some urgency.  Denies any back pains, nausea vomiting diarrhea. She has history of hysterectomy and right oophrectomy.    Past Medical History:  Diagnosis Date  . Anxiety and depression   . Arthritis   . Colon polyps   . GERD (gastroesophageal reflux disease)   . Hemorrhoids   . Hiatal hernia   . History of kidney stones    passed  . Hyperlipemia   . Mixed hyperlipidemia 11/26/2013  . Panic disorder   . Passive smoke exposure 07/17/2018  . Renal insufficiency    stage 3 kidney disease per her PCP  . Stomach ulcer   . Unspecified essential hypertension 11/26/2013     Allergies  Allergen Reactions  . Other     All pain medications: Unknown/ CODEINE  . Prednisone Other (See Comments)    DYSPHORIA  . Vioxx [Rofecoxib]     unknown  . Entex T [Pseudoephedrine-Guaifenesin] Palpitations  . Levaquin [Levofloxacin] Rash    Tolerated Avelox without adverse effect 11/08/15.  . Sulfa Antibiotics Rash  . Trovan [Alatrofloxacin] Rash    Current Outpatient Medications on File Prior to Visit  Medication Sig  . aspirin 81 MG tablet Take 81 mg by mouth at bedtime.   Marland Kitchen  Biotin 10000 MCG TABS Take by mouth daily.  . cholecalciferol (VITAMIN D) 1000 units tablet Take 1,000 Units by mouth daily.  . clonazePAM (KLONOPIN) 1 MG tablet clonazepam 1 mg tablet  . diltiazem (CARDIZEM) 120 MG tablet TAKE 1 TABLET BY MOUTH TWICE DAILY FOR BLOOD PRESSURE  . famotidine (PEPCID) 20 MG tablet 20 mg daily. As needed.  . furosemide (LASIX) 20 MG tablet Take 1 tablet (20 mg total) by mouth daily as needed for edema.  Earley Abide Compounding Base LIQD Take 5 mLs by mouth every 2 (two) hours as  needed.  . potassium chloride SA (K-DUR,KLOR-CON) 20 MEQ tablet Take 1 tablet (20 mEq total) by mouth daily as needed. When taking lasix.  Marland Kitchen pravastatin (PRAVACHOL) 40 MG tablet Take 1 tablet Daily for Cholesterol  . propranolol (INDERAL) 10 MG tablet Take 1 tablet (10 mg total) by mouth 2 (two) times daily as needed (anxiety).  . sertraline (ZOLOFT) 100 MG tablet Take 1 tablet Daily for  Mood  . triamcinolone (NASACORT) 55 MCG/ACT AERO nasal inhaler Place 2 sprays into the nose at bedtime.   No current facility-administered medications on file prior to visit.     ROS: all negative except above.   Physical Exam:  There were no vitals taken for this visit.  General Appearance: Well nourished, in no apparent distress. Eyes: PERRLA, EOMs, conjunctiva no swelling or erythema Sinuses: No Frontal/maxillary tenderness ENT/Mouth: Ext aud canals clear, TMs without erythema, bulging. No erythema, swelling, or exudate on post pharynx.  Tonsils not swollen or erythematous. Hearing normal.  Neck: Supple, thyroid normal.  Respiratory: Respiratory effort normal, BS equal bilaterally without rales, rhonchi, wheezing or stridor.  Cardio: RRR with no MRGs. Brisk peripheral pulses without edema.  Abdomen: Soft, + BS.  Non tender to palpation, mild tenderness suprapubic no guarding, rebound, hernias, masses. Lymphatics: Non tender without lymphadenopathy.  Musculoskeletal: Full ROM, 5/5 strength, normal gait.  Skin: Warm, dry without rashes, lesions, ecchymosis.  Neuro: Cranial nerves intact. Normal muscle tone, no cerebellar symptoms. Sensation intact.  Psych: Awake and oriented X 3, normal affect, Insight and Judgment appropriate.      Garnet Sierras, NP 9:42 AM [facility]

## 2019-04-15 NOTE — Patient Instructions (Addendum)
We will contact you in 1-3 days with lab results.  We will send and antibiotic to your pharmacy.  Get AZO, take this 3-4 times a day for 3 days.  This will help with bladder spasms.  It will make your urine bright orange, this is normal.  I will also send in diflucan tablet.  Take one today and the second one in three days.   Drink 60-80 oz of water a day.  Please let us know if you have any new or worsening symptoms

## 2019-04-16 LAB — URINALYSIS W MICROSCOPIC + REFLEX CULTURE
Bacteria, UA: NONE SEEN /HPF
Bilirubin Urine: NEGATIVE
Glucose, UA: NEGATIVE
Hgb urine dipstick: NEGATIVE
Hyaline Cast: NONE SEEN /LPF
Ketones, ur: NEGATIVE
Leukocyte Esterase: NEGATIVE
Nitrites, Initial: NEGATIVE
Protein, ur: NEGATIVE
RBC / HPF: NONE SEEN /HPF (ref 0–2)
Specific Gravity, Urine: 1.019 (ref 1.001–1.03)
Squamous Epithelial / HPF: NONE SEEN /HPF (ref <=5)
WBC, UA: NONE SEEN /HPF (ref 0–5)
pH: 6.5 (ref 5.0–8.0)

## 2019-04-16 LAB — NO CULTURE INDICATED

## 2019-04-17 ENCOUNTER — Encounter: Payer: Self-pay | Admitting: Adult Health Nurse Practitioner

## 2019-05-02 NOTE — Progress Notes (Signed)
3 month follow up Assessment and Plan:  Essential hypertension - continue medications, DASH diet, exercise and monitor at home. Call if greater than 130/80.  -     CBC with Differential/Platelet -     CMP/GFR -     TSH  Aortic atherosclerosis (HCC) Control blood pressure, cholesterol, glucose, increase exercise.   CKD (chronic kidney disease) stage 3, GFR 30-59 ml/min Increase fluids, avoid NSAIDS, monitor sugars, will monitor       -     CMP/GFR  Mixed hyperlipidemia -continue medications, check lipids, decrease fatty foods, increase activity.  -     Lipid panel  Other abnormal glucose Discussed general issues about diabetes pathophysiology and management., Educational material distributed., Suggested low cholesterol diet., Encouraged aerobic exercise., Discussed foot care., Reminded to get yearly retinal exam.       -     A1C  Medication management -     Magnesium  Gastroesophageal reflux disease, esophagitis presence not specified Continue PPI/H2 blocker, diet discussed  Generalized anxiety disorder continue medications, stress management techniques discussed, increase water, good sleep hygiene discussed, increase exercise, and increase veggies.   Vitamin D deficiency Continue supplementation Check vitamin D level  Neck pain Normal exam, given information.   Over 30 minutes of exam, counseling, chart review and critical decision making was performed Future Appointments  Date Time Provider [place]  06/23/2019  2:30 PM [facility] MM 3 [facility]  06/23/2019  3:00 PM [facility] DX DEXA 1 [facility]  08/07/2019  9:00 AM [name], NP [facility] None  11/06/2019 10:00 AM [name], NP [facility] None  02/03/2020 11:00 AM [name], MD [facility] None     Subjective:  Latoya [name] is a 68 y.o. 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 presents for follow up.   She has been having bilateral neck pain, aching, throbbing. Had US thyroid 10/2018 and need repeat 10/2019. No pain with swallowing. Worse when she gets upset. Some neck pain. Vision does not go out on you. Some dizziness/imbalance.   She is on zoloft and trazodone , and on klonopin rarely for anxiety/depression. She is somewhat stressed as her husband has respiratory failure and she does the majority of work around the house.   BMI is Body mass index is 24.45 kg/m., she has been working on diet and exercise. Wt Readings from Last 3 Encounters:  05/06/19 138 lb (62.6 kg)  04/15/19 136 lb 12.8 oz (62.1 kg)  02/03/19 138 lb (62.6 kg)   Her blood pressure has been controlled at home, today their BP is BP: 124/84  Normal ECHO, Dr. Stanford Breed in 10/2017.   She does workout, walking, yard work, house work. She denies chest pain, shortness of breath, dizziness.   She is on cholesterol medication (pravastatin 40 mg daily) and denies myalgias. Her cholesterol is at goal. The cholesterol last visit was:   Lab Results  Component Value Date   CHOL 164 10/03/2018   HDL 59 10/03/2018   LDLCALC 79 10/03/2018   TRIG 156 (H) 10/03/2018   CHOLHDL 2.8 10/03/2018    She has been working on diet and exercise for prediabetes, and denies paresthesia of the feet, polydipsia, polyuria and  visual disturbances. Last A1C in the office was:  Lab Results  Component Value Date   HGBA1C 5.5 07/03/2018   Last GFR: Lab Results  Component Value Date   GFRNONAA 61 01/01/2019   Patient is on Vitamin D supplement.   Lab Results  Component Value Date   VD25OH 58 01/01/2019        Medication Review: Current Outpatient Medications on File Prior to Visit  Medication Sig  . aspirin 81 MG tablet Take 81 mg by mouth at bedtime.   . Biotin 10000 MCG TABS Take by mouth daily.  . cholecalciferol (VITAMIN D) 1000 units tablet Take 1,000 Units by mouth daily.  . clonazePAM (KLONOPIN) 1 MG tablet clonazepam 1 mg tablet  . diltiazem (CARDIZEM) 120 MG tablet TAKE 1 TABLET BY MOUTH TWICE DAILY FOR BLOOD PRESSURE  . famotidine (PEPCID) 20 MG tablet 20 mg daily. As needed.  . furosemide (LASIX) 20 MG tablet Take 1 tablet (20 mg total) by mouth daily as needed for edema.  Earley [employer] LIQD Take 5 mLs by mouth every 2 (two) hours as needed.  . potassium chloride SA (K-DUR,KLOR-CON) 20 MEQ tablet Take 1 tablet (20 mEq total) by mouth daily as needed. When taking lasix.  Marland Kitchen pravastatin (PRAVACHOL) 40 MG tablet Take 1 tablet Daily for Cholesterol  . propranolol (INDERAL) 10 MG tablet Take 1 tablet (10 mg total) by mouth 2 (two) times daily as needed (anxiety).  . sertraline (ZOLOFT) 100 MG tablet Take 1 tablet Daily for  Mood  . triamcinolone (NASACORT) 55 MCG/ACT AERO nasal inhaler Place 2 sprays into the nose at bedtime.   No current facility-administered medications on file prior to visit.      Allergies  Allergen Reactions  . Other     All pain medications: Unknown/ CODEINE  . Prednisone Other (See Comments)    DYSPHORIA  . Vioxx [Rofecoxib]     unknown  . Entex T [Pseudoephedrine-Guaifenesin] Palpitations  . Levaquin [Levofloxacin] Rash    Tolerated Avelox without adverse effect 11/08/15.  . Sulfa Antibiotics Rash  . Trovan [Alatrofloxacin] Rash    Current Problems (verified) Patient Active Problem List   Diagnosis Date Noted  . FHx: heart disease 11/27/2017  . 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 Laryngopharyngeal reflux (LPR) 10/12/2016  . Chronic rhinitis 10/09/2016  . Hiatal hernia 07/14/2016  . Aortic atherosclerosis ([place])  06/30/2016  . CKD (chronic kidney disease) stage 3, GFR 30-59 ml/min (HCC) 06/30/2016  . Thyroid nodule 12/24/2015  . Abnormal glucose 05/01/2014  . Vitamin D deficiency 05/01/2014  . Medication management 05/01/2014  . Essential hypertension 11/26/2013  . Hyperlipidemia, mixed 11/26/2013  . Chronic cough 09/19/2013  . Generalized anxiety disorder 07/11/2013  . Esophageal reflux 11/29/2012    SURGICAL HISTORY She  has a past surgical history that includes Abdominal hysterectomy; Tubal ligation; Foot surgery (Left, 2008); Colonoscopy; Upper gastrointestinal endoscopy; Oophorectomy (Right); Carpometacarpel [facility]) suspension plasty (Right, 12/08/2013); I&D extremity (Right, 01/20/2017); and Hand surgery (Right). FAMILY HISTORY Her family history includes Asthma in her mother; Dementia in her brother; Heart attack in her brother; Heart disease in her brother and father; Pancreatic cancer in her brother. SOCIAL HISTORY She  reports that she is a non-smoker but has been exposed to tobacco smoke. She has never used smokeless tobacco. She reports that she does not drink alcohol or use drugs.   Review of Systems  Constitutional: Negative.  Negative for malaise/fatigue and weight loss.  HENT: Negative.  Negative for  hearing loss and tinnitus.   Eyes: Negative.  Negative for blurred vision and double vision.  Respiratory: Negative.  Negative for cough, sputum production, shortness of breath and wheezing.   Cardiovascular: Negative.  Negative for chest pain, palpitations, orthopnea, claudication, leg swelling and PND.  Gastrointestinal: Positive for abdominal pain. Negative for blood in stool, constipation, diarrhea, heartburn, melena, nausea and vomiting.  Genitourinary: Positive for flank pain. Negative for dysuria, frequency, hematuria and urgency.  Musculoskeletal: Positive for back pain (Chronic, had injections, declines further interventions). Negative for falls, joint pain and myalgias.  Skin:  Negative.  Negative for rash.  Neurological: Negative for dizziness, tingling, tremors, sensory change, speech change, focal weakness, seizures, loss of consciousness, weakness and headaches.  Endo/Heme/Allergies: Negative.  Negative for polydipsia.  Psychiatric/Behavioral: Negative.  Negative for depression, memory loss, substance abuse and suicidal ideas. The patient is not nervous/anxious and does not have insomnia.   All other systems reviewed and are negative.   Objective:     Today's Vitals   05/06/19 1049  BP: 124/84  Pulse: 94  Temp: 97.8 F (36.6 C)  SpO2: 95%  Weight: 138 lb (62.6 kg)  Height: 5\' 3"  (1.6 m)   Body mass index is 24.45 kg/m.  General appearance: alert, no distress, WD/WN, female HEENT: normocephalic, sclerae anicteric, TMs pearly, nares patent, no discharge or erythema, pharynx normal Oral cavity: MMM, no lesions Neck: supple, no lymphadenopathy, no thyromegaly, no masses Heart: RRR, normal S1, S2, no murmurs Lungs: CTA bilaterally, no wheezes, rhonchi, or rales Abdomen: +bs, soft, generlized tenderness through left and lower quadrants, non distended, no masses, no hepatomegaly, no splenomegaly Musculoskeletal: She has some left lower chest wall tenderness, no swelling, no obvious deformity Extremities: no edema, no cyanosis, no clubbing Pulses: 2+ symmetric, upper and lower extremities, normal cap refill Neurological: alert, oriented x 3, CN2-12 intact, strength normal upper extremities and lower extremities, sensation normal throughout, DTRs 2+  no cerebellar signs, gait normal Psychiatric: normal affect, behavior normal, pleasant  Breasts: symmetrical without palpable lumps, texture intake, nipples not inverted, no discharge, no axillary lymphadenopathy   [name], PA-C   05/06/2019

## 2019-05-06 ENCOUNTER — Encounter: Payer: Self-pay | Admitting: Physician Assistant

## 2019-05-06 ENCOUNTER — Other Ambulatory Visit: Payer: Self-pay

## 2019-05-06 ENCOUNTER — Ambulatory Visit (INDEPENDENT_AMBULATORY_CARE_PROVIDER_SITE_OTHER): Payer: PPO | Admitting: Physician Assistant

## 2019-05-06 VITALS — BP 124/84 | HR 94 | Temp 97.8°F | Ht 63.0 in | Wt 138.0 lb

## 2019-05-06 DIAGNOSIS — R7309 Other abnormal glucose: Secondary | ICD-10-CM | POA: Diagnosis not present

## 2019-05-06 DIAGNOSIS — Z79899 Other long term (current) drug therapy: Secondary | ICD-10-CM

## 2019-05-06 DIAGNOSIS — I10 Essential (primary) hypertension: Secondary | ICD-10-CM | POA: Diagnosis not present

## 2019-05-06 DIAGNOSIS — E559 Vitamin D deficiency, unspecified: Secondary | ICD-10-CM

## 2019-05-06 DIAGNOSIS — E782 Mixed hyperlipidemia: Secondary | ICD-10-CM | POA: Diagnosis not present

## 2019-05-06 DIAGNOSIS — N183 Chronic kidney disease, stage 3 unspecified: Secondary | ICD-10-CM

## 2019-05-06 DIAGNOSIS — E041 Nontoxic single thyroid nodule: Secondary | ICD-10-CM | POA: Diagnosis not present

## 2019-05-06 DIAGNOSIS — I700 Atherosclerosis of aorta: Secondary | ICD-10-CM | POA: Diagnosis not present

## 2019-05-06 NOTE — Patient Instructions (Signed)
GENERAL HEALTH GOALS  Know what a healthy weight is for you (roughly BMI <25) and aim to maintain this  Aim for 7+ servings of fruits and vegetables daily  70-80+ fluid ounces of water or unsweet tea for healthy kidneys  Limit to max 1 drink of alcohol per day; avoid smoking/tobacco  Limit animal fats in diet for cholesterol and heart health - choose grass fed whenever available  Avoid highly processed foods, and foods high in saturated/trans fats  Aim for low stress - take time to unwind and care for your mental health  Aim for 150 min of moderate intensity exercise weekly for heart health, and weights twice weekly for bone health  Aim for 7-9 hours of sleep daily  Can refer to physical therapy if you want Let us know if symptoms change at all.   Cervical Strain and Sprain Rehab Ask your health care provider which exercises are safe for you. Do exercises exactly as told by your health care provider and adjust them as directed. It is normal to feel mild stretching, pulling, tightness, or discomfort as you do these exercises. Stop right away if you feel sudden pain or your pain gets worse. Do not begin these exercises until told by your health care provider. Stretching and range-of-motion exercises Cervical side bending  1. Using good posture, sit on a stable chair or stand up. 2. Without moving your shoulders, slowly tilt your left / right ear to your shoulder until you feel a stretch in the opposite side neck muscles. You should be looking straight ahead. 3. Hold for __________ seconds. 4. Repeat with the other side of your neck. Repeat __________ times. Complete this exercise __________ times a day. Cervical rotation  1. Using good posture, sit on a stable chair or stand up. 2. Slowly turn your head to the side as if you are looking over your left / right shoulder. ? Keep your eyes level with the ground. ? Stop when you feel a stretch along the side and the back of your neck.  3. Hold for __________ seconds. 4. Repeat this by turning to your other side. Repeat __________ times. Complete this exercise __________ times a day. Thoracic extension and pectoral stretch 1. Roll a towel or a small blanket so it is about 4 inches (10 cm) in diameter. 2. Lie down on your back on a firm surface. 3. Put the towel lengthwise, under your spine in the middle of your back. It should not be under your shoulder blades. The towel should line up with your spine from your middle back to your lower back. 4. Put your hands behind your head and let your elbows fall out to your sides. 5. Hold for __________ seconds. Repeat __________ times. Complete this exercise __________ times a day. Strengthening exercises Isometric upper cervical flexion 1. Lie on your back with a thin pillow behind your head and a small rolled-up towel under your neck. 2. Gently tuck your chin toward your chest and nod your head down to look toward your feet. Do not lift your head off the pillow. 3. Hold for __________ seconds. 4. Release the tension slowly. Relax your neck muscles completely before you repeat this exercise. Repeat __________ times. Complete this exercise __________ times a day. Isometric cervical extension  1. Stand about 6 inches (15 cm) away from a wall, with your back facing the wall. 2. Place a soft object, about 6-8 inches (15-20 cm) in diameter, between the back of your head and  the wall. A soft object could be a small pillow, a ball, or a folded towel. 3. Gently tilt your head back and press into the soft object. Keep your jaw and forehead relaxed. 4. Hold for __________ seconds. 5. Release the tension slowly. Relax your neck muscles completely before you repeat this exercise. Repeat __________ times. Complete this exercise __________ times a day. Posture and body mechanics Body mechanics refers to the movements and positions of your body while you do your daily activities. Posture is part  of body mechanics. Good posture and healthy body mechanics can help to relieve stress in your body's tissues and joints. Good posture means that your spine is in its natural S-curve position (your spine is neutral), your shoulders are pulled back slightly, and your head is not tipped forward. The following are general guidelines for applying improved posture and body mechanics to your everyday activities. Sitting  1. When sitting, keep your spine neutral and keep your feet flat on the floor. Use a footrest, if necessary, and keep your thighs parallel to the floor. Avoid rounding your shoulders, and avoid tilting your head forward. 2. When working at a desk or a computer, keep your desk at a height where your hands are slightly lower than your elbows. Slide your chair under your desk so you are close enough to maintain good posture. 3. When working at a computer, place your monitor at a height where you are looking straight ahead and you do not have to tilt your head forward or downward to look at the screen. Standing   When standing, keep your spine neutral and keep your feet about hip-width apart. Keep a slight bend in your knees. Your ears, shoulders, and hips should line up.  When you do a task in which you stand in one place for a long time, place one foot up on a stable object that is 2-4 inches (5-10 cm) high, such as a footstool. This helps keep your spine neutral. Resting When lying down and resting, avoid positions that are most painful for you. Try to support your neck in a neutral position. You can use a contour pillow or a small rolled-up towel. Your pillow should support your neck but not push on it. This information is not intended to replace advice given to you by your health care provider. Make sure you discuss any questions you have with your health care provider. Document Released: 08/21/2005 Document Revised: 12/11/2018 Document Reviewed: 05/22/2018 Elsevier Patient Education   2020 [employer].

## 2019-05-07 LAB — CBC WITH DIFFERENTIAL/PLATELET
Absolute Monocytes: 451 cells/uL (ref 200–950)
Basophils Absolute: 32 cells/uL (ref 0–200)
Basophils Relative: 0.6 %
Eosinophils Absolute: 111 cells/uL (ref 15–500)
Eosinophils Relative: 2.1 %
HCT: 40.4 % (ref 35.0–45.0)
Hemoglobin: 13.2 g/dL (ref 11.7–15.5)
Lymphs Abs: 1304 cells/uL (ref 850–3900)
MCH: 29.9 pg (ref 27.0–33.0)
MCHC: 32.7 g/dL (ref 32.0–36.0)
MCV: 91.6 fL (ref 80.0–100.0)
MPV: 10.1 fL (ref 7.5–12.5)
Monocytes Relative: 8.5 %
Neutro Abs: 3403 cells/uL (ref 1500–7800)
Neutrophils Relative %: 64.2 %
Platelets: 188 10*3/uL (ref 140–400)
RBC: 4.41 10*6/uL (ref 3.80–5.10)
RDW: 12.7 % (ref 11.0–15.0)
Total Lymphocyte: 24.6 %
WBC: 5.3 10*3/uL (ref 3.8–10.8)

## 2019-05-07 LAB — COMPLETE METABOLIC PANEL WITH GFR
AG Ratio: 1.9 (calc) (ref 1.0–2.5)
ALT: 12 U/L (ref 6–29)
AST: 24 U/L (ref 10–35)
Albumin: 4.1 g/dL (ref 3.6–5.1)
Alkaline phosphatase (APISO): 78 U/L (ref 37–153)
BUN/Creatinine Ratio: 12 (calc) (ref 6–22)
BUN: 12 mg/dL (ref 7–25)
CO2: 26 mmol/L (ref 20–32)
Calcium: 9.5 mg/dL (ref 8.6–10.4)
Chloride: 111 mmol/L — ABNORMAL HIGH (ref 98–110)
Creat: 1.04 mg/dL — ABNORMAL HIGH (ref 0.50–0.99)
GFR, Est African American: 63 mL/min/{1.73_m2} (ref >=60)
GFR, Est Non African American: 55 mL/min/{1.73_m2} — ABNORMAL LOW (ref >=60)
Globulin: 2.2 g/dL (calc) (ref 1.9–3.7)
Glucose, Bld: 79 mg/dL (ref 65–99)
Potassium: 4.1 mmol/L (ref 3.5–5.3)
Sodium: 145 mmol/L (ref 135–146)
Total Bilirubin: 0.4 mg/dL (ref 0.2–1.2)
Total Protein: 6.3 g/dL (ref 6.1–8.1)

## 2019-05-07 LAB — LIPID PANEL
Cholesterol: 172 mg/dL (ref <200)
HDL: 47 mg/dL — ABNORMAL LOW (ref >=50)
LDL Cholesterol (Calc): 102 mg/dL (calc) — ABNORMAL HIGH
Non-HDL Cholesterol (Calc): 125 mg/dL (calc) (ref <130)
Total CHOL/HDL Ratio: 3.7 (calc) (ref <5.0)
Triglycerides: 135 mg/dL (ref <150)

## 2019-05-07 LAB — VITAMIN D 25 HYDROXY (VIT D DEFICIENCY, FRACTURES): Vit D, 25-Hydroxy: 46 ng/mL (ref 30–100)

## 2019-05-07 LAB — TSH: TSH: 1.49 mIU/L (ref 0.40–4.50)

## 2019-05-07 LAB — MAGNESIUM: Magnesium: 2.3 mg/dL (ref 1.5–2.5)

## 2019-05-10 ENCOUNTER — Other Ambulatory Visit: Payer: Self-pay | Admitting: Physician Assistant

## 2019-05-21 ENCOUNTER — Other Ambulatory Visit: Payer: Self-pay

## 2019-05-21 ENCOUNTER — Ambulatory Visit (INDEPENDENT_AMBULATORY_CARE_PROVIDER_SITE_OTHER): Payer: PPO

## 2019-05-21 VITALS — Temp 97.4°F

## 2019-05-21 DIAGNOSIS — Z23 Encounter for immunization: Secondary | ICD-10-CM | POA: Diagnosis not present

## 2019-05-21 NOTE — Progress Notes (Signed)
Patient presents to the office forHDFlu Vaccine. Vaccine administered toLEFTDeltoid withoutanycomplication. Temperature taken and recorded 

## 2019-05-26 ENCOUNTER — Telehealth: Payer: Self-pay

## 2019-05-26 NOTE — Telephone Encounter (Signed)
-----   Message from [name], [place] sent at 05/26/2019  1:48 PM EDT ----- Regarding: RE: office note/ sick Contact: [phone] Suggest get tested for COVID and make an office visit or telephone visit.  Estill Bamberg ----- Message ----- From: [name], CMA Sent: 05/26/2019  12:28 PM EDT To: [name], PA-C Subject: office note/ sick                              Sick & congested---cough----ear ache  Wants to know if you would call something into her pharmacy-----[facility] [address]  Does not want to come in office

## 2019-05-26 NOTE — Telephone Encounter (Signed)
PATIENT states there is no need to go get tested for COVID because she states she has not been any where to catch anything.  She states she was out in the yard sweating then going back inside with the A/C. She again states she does not need to come in or do a phone visit.

## 2019-05-27 NOTE — Telephone Encounter (Signed)
She can make a telephone visit to discuss her symptoms and we can send in a medication after that.  Go to the ER if any chest pain, shortness of breath, nausea, dizziness, severe HA, changes vision/speech

## 2019-05-29 NOTE — Telephone Encounter (Signed)
PATIENT HAS DECLINED.

## 2019-06-11 DIAGNOSIS — M25561 Pain in right knee: Secondary | ICD-10-CM | POA: Diagnosis not present

## 2019-06-23 ENCOUNTER — Other Ambulatory Visit: Payer: Self-pay | Admitting: Adult Health

## 2019-06-23 ENCOUNTER — Ambulatory Visit
Admission: RE | Admit: 2019-06-23 | Discharge: 2019-06-23 | Disposition: A | Discharge status: Home / Self Care | Payer: PPO | Source: Ambulatory Visit | Attending: Adult Health | Admitting: Adult Health

## 2019-06-23 ENCOUNTER — Ambulatory Visit
Admission: RE | Admit: 2019-06-23 | Discharge: 2019-06-23 | Disposition: A | Discharge status: Home / Self Care | Payer: PPO | Source: Ambulatory Visit | Attending: Internal Medicine | Admitting: Internal Medicine

## 2019-06-23 ENCOUNTER — Encounter: Payer: Self-pay | Admitting: Adult Health

## 2019-06-23 ENCOUNTER — Other Ambulatory Visit: Payer: Self-pay | Admitting: Internal Medicine

## 2019-06-23 ENCOUNTER — Other Ambulatory Visit: Payer: Self-pay

## 2019-06-23 DIAGNOSIS — E2839 Other primary ovarian failure: Secondary | ICD-10-CM

## 2019-06-23 DIAGNOSIS — Z1231 Encounter for screening mammogram for malignant neoplasm of breast: Secondary | ICD-10-CM

## 2019-06-23 DIAGNOSIS — R921 Mammographic calcification found on diagnostic imaging of breast: Secondary | ICD-10-CM

## 2019-06-23 DIAGNOSIS — M810 Age-related osteoporosis without current pathological fracture: Secondary | ICD-10-CM

## 2019-06-23 DIAGNOSIS — Z780 Asymptomatic menopausal state: Secondary | ICD-10-CM | POA: Diagnosis not present

## 2019-06-23 MED ORDER — ALENDRONATE SODIUM 70 MG PO TABS: 70.0000 mg | ORAL_TABLET | ORAL | 1 refills | Status: DC

## 2019-06-25 ENCOUNTER — Telehealth: Payer: Self-pay

## 2019-06-25 NOTE — Telephone Encounter (Signed)
-----   Message from [name], [place] sent at 06/25/2019  8:14 AM EDT ----- Regarding: RE: VITS Contact: [phone] She needs to continue her vitamin D She needs to add sublingual B12 any dose but needs to be sublingual.  Needs zinc 50mg  to take daily.  That is it.   From the labs: B12 is very low, get on a sublingual B12, the dose does not make a difference, this is found over the counter.  Will help with energy, memory/concentration, decrease nerve pain, help decrease hair falling out and help with weight loss.  B12 is water soluble vitamin so you can not over dose on it.   Zinc is low get on 30-50 mg a day, can find on [employer] at this time with nature's bounty- this can cause your hair to fall out.  ----- Message ----- From: [name], CMA Sent: 06/24/2019   2:40 PM EDT To: [name], PA-C Subject: VITS                                           Per office note:  Patient states that she would like you to call her & inform her on what VITS she should take. She didn't understand what she was told  Please advise

## 2019-06-25 NOTE — Telephone Encounter (Signed)
Patient was aware of the lab results but she thought that due to her having a DEXA done her VIT would have changed. The patient was informed again of the requested information.  But she would still like to know if she needs to change anything & if so please advise.  Patient's LAB results were printed after this phone call just as a REMINDER. Oct 21st 20 at 9:15am

## 2019-07-25 ENCOUNTER — Telehealth: Payer: Self-pay | Admitting: Physician Assistant

## 2019-07-25 MED ORDER — CYCLOBENZAPRINE HCL 10 MG PO TABS: 10.0000 mg | ORAL_TABLET | Freq: Three times a day (TID) | ORAL | 1 refills | Status: DC | PRN

## 2019-07-25 NOTE — Telephone Encounter (Signed)
-----   Message from [name], NP sent at 07/24/2019  1:58 PM EST ----- Contact: [phone] This got sent to me but looks like she discussed neck pain with you at last appointment in 05/2019 - do you want to send her a muscle relaxer?

## 2019-07-25 NOTE — Telephone Encounter (Signed)
Sent in muscle relaxer, if she is still having pain please make an office visit here or with ortho.  Go to the ER if any chest pain, shortness of breath, nausea, dizziness, severe HA, changes vision/speech

## 2019-07-28 NOTE — Telephone Encounter (Signed)
Patient has picked up meds. (Per husband)

## 2019-07-29 ENCOUNTER — Ambulatory Visit (INDEPENDENT_AMBULATORY_CARE_PROVIDER_SITE_OTHER): Payer: PPO | Admitting: Adult Health Nurse Practitioner

## 2019-07-29 ENCOUNTER — Encounter: Payer: Self-pay | Admitting: Adult Health Nurse Practitioner

## 2019-07-29 ENCOUNTER — Other Ambulatory Visit: Payer: Self-pay

## 2019-07-29 VITALS — BP 136/78 | HR 86 | Temp 98.6°F | Wt 135.0 lb

## 2019-07-29 DIAGNOSIS — I10 Essential (primary) hypertension: Secondary | ICD-10-CM

## 2019-07-29 DIAGNOSIS — F419 Anxiety disorder, unspecified: Secondary | ICD-10-CM | POA: Diagnosis not present

## 2019-07-29 DIAGNOSIS — Z79899 Other long term (current) drug therapy: Secondary | ICD-10-CM

## 2019-07-29 DIAGNOSIS — S161XXA Strain of muscle, fascia and tendon at neck level, initial encounter: Secondary | ICD-10-CM

## 2019-07-29 MED ORDER — DICLOFENAC SODIUM 1 % EX GEL: 4.0000 g | Freq: Four times a day (QID) | CUTANEOUS | 3 refills | Status: DC

## 2019-07-29 MED ORDER — METHOCARBAMOL 750 MG PO TABS: 750.0000 mg | ORAL_TABLET | Freq: Three times a day (TID) | ORAL | 1 refills | Status: DC | PRN

## 2019-07-29 NOTE — Patient Instructions (Signed)
Cervical Strain  A cervical sprain is a stretch or tear in one or more of the tough, cord-like tissues that connect bones (ligaments) in the neck. Cervical sprains can range from mild to severe. Severe cervical sprains can cause the spinal bones (vertebrae) in the neck to be unstable. This can lead to spinal cord damage and can result in serious nervous system problems. The amount of time that it takes for a cervical sprain to get better depends on the cause and extent of the injury. Most cervical sprains heal in 4-6 weeks. What are the causes? Cervical sprains may be caused by an injury (trauma), such as from a motor vehicle accident, a fall, or sudden forward and backward whipping movement of the head and neck (whiplash injury). Mild cervical sprains may be caused by wear and tear over time, such as from poor posture, sitting in a chair that does not provide support, or looking up or down for long periods of time. What increases the risk? The following factors may make you more likely to develop this condition:  Participating in activities that have a high risk of trauma to the neck. These include contact sports, auto racing, gymnastics, and diving.  Taking risks when driving or riding in a motor vehicle, such as speeding.  Having osteoarthritis of the spine.  Having poor strength and flexibility of the neck.  A previous neck injury.  Having poor posture.  Spending a lot of time in certain positions that put stress on the neck, such as sitting at a computer for long periods of time. What are the signs or symptoms? Symptoms of this condition include:  Pain, soreness, stiffness, tenderness, swelling, or a burning sensation in the front, back, or sides of the neck.  Sudden tightening of neck muscles that you cannot control (muscle spasms).  Pain in the shoulders or upper back.  Limited ability to move the neck.  Headache.  Dizziness.  Nausea.  Vomiting.  Weakness,  numbness, or tingling in a hand or an arm. Symptoms may develop right away after injury, or they may develop over a few days. In some cases, symptoms may go away with treatment and return (recur) over time. How is this diagnosed? This condition may be diagnosed based on:  Your medical history.  Your symptoms.  Any recent injuries or known neck problems that you have, such as arthritis in the neck.  A physical exam.  Imaging tests, such as: ? X-rays. ? MRI. ? CT scan. How is this treated? This condition is treated by resting and icing the injured area and doing physical therapy exercises. Depending on the severity of your condition, treatment may also include:  Keeping your neck in place (immobilized) for periods of time. This may be done using: ? A cervical collar. This supports your chin and the back of your head. ? A cervical traction device. This is a sling that holds up your head. This removes weight and pressure from your neck, and it may help to relieve pain.  Medicines that help to relieve pain and inflammation.  Medicines that help to relax your muscles (muscle relaxants).  Surgery. This is rare. Follow these instructions at home: If you have a cervical collar:   Wear it as told by your health care provider. Do not remove the collar unless instructed by your health care provider.  Ask your health care provider before you make any adjustments to your collar.  If you have long hair, keep it outside  of the collar.  Ask your health care provider if you can remove the collar for cleaning and bathing. If you are allowed to remove the collar for cleaning or bathing: ? Follow instructions from your health care provider about how to remove the collar safely. ? Clean the collar by wiping it with mild soap and water and drying it completely. ? If your collar has removable pads, remove them every 1-2 days and wash them by hand with soap and water. Let them air-dry completely  before you put them back in the collar. ? Check your skin under the collar for irritation or sores. If you see any, tell your health care provider. Managing pain, stiffness, and swelling   If directed, use a cervical traction device as told by your health care provider.  If directed, apply heat to the affected area before you do your physical therapy or as often as told by your health care provider. Use the heat source that your health care provider recommends, such as a moist heat pack or a heating pad. ? Place a towel between your skin and the heat source. ? Leave the heat on for 20-30 minutes. ? Remove the heat if your skin turns bright red. This is especially important if you are unable to feel pain, heat, or cold. You may have a greater risk of getting burned.  If directed, put ice on the affected area: ? Put ice in a plastic bag. ? Place a towel between your skin and the bag. ? Leave the ice on for 20 minutes, 2-3 times a day. Activity  Do not drive while wearing a cervical collar. If you do not have a cervical collar, ask your health care provider if it is safe to drive while your neck heals.  Do not drive or use heavy machinery while taking prescription pain medicine or muscle relaxants, unless your health care provider approves.  Do not lift anything that is heavier than 10 lb (4.5 kg) until your health care provider tells you that it is safe.  Rest as directed by your health care provider. Avoid positions and activities that make your symptoms worse. Ask your health care provider what activities are safe for you.  If physical therapy was prescribed, do exercises as told by your health care provider or physical therapist. General instructions  Take over-the-counter and prescription medicines only as told by your health care provider.  Do not use any products that contain nicotine or tobacco, such as cigarettes and e-cigarettes. These can delay healing. If you need help  quitting, ask your health care provider.  Keep all follow-up visits as told by your health care provider or physical therapist. This is important. How is this prevented? To prevent a cervical sprain from happening again:  Use and maintain good posture. Make any needed adjustments to your workstation to help you use good posture.  Exercise regularly as directed by your health care provider or physical therapist.  Avoid risky activities that may cause a cervical sprain. Contact a health care provider if:  You have symptoms that get worse or do not get better after 2 weeks of treatment.  You have pain that gets worse or does not get better with medicine.  You develop new, unexplained symptoms.  You have sores or irritated skin on your neck from wearing your cervical collar. Get help right away if:  You have severe pain.  You develop numbness, tingling, or weakness in any part of your body.  You  cannot move a part of your body (you have paralysis).  You have neck pain along with: ? Severe dizziness. ? Headache. Summary  A cervical sprain is a stretch or tear in one or more of the tough, cord-like tissues that connect bones (ligaments) in the neck.  Cervical sprains may be caused by an injury (trauma), such as from a motor vehicle accident, a fall, or sudden forward and backward whipping movement of the head and neck (whiplash injury).  Symptoms may develop right away after injury, or they may develop over a few days.  This condition is treated by resting and icing the injured area and doing physical therapy exercises. This information is not intended to replace advice given to you by your health care provider. Make sure you discuss any questions you have with your health care provider. Document Released: 06/18/2007 Document Revised: 12/11/2018 Document Reviewed: 04/19/2016 Elsevier Patient Education  [employer].    Cervical Strain and Sprain Rehab Ask your health  care provider which exercises are safe for you. Do exercises exactly as told by your health care provider and adjust them as directed. It is normal to feel mild stretching, pulling, tightness, or discomfort as you do these exercises. Stop right away if you feel sudden pain or your pain gets worse. Do not begin these exercises until told by your health care provider. Stretching and range-of-motion exercises Cervical side bending  1. Using good posture, sit on a stable chair or stand up. 2. Without moving your shoulders, slowly tilt your left / right ear to your shoulder until you feel a stretch in the opposite side neck muscles. You should be looking straight ahead. 3. Hold for __________ seconds. 4. Repeat with the other side of your neck. Repeat __________ times. Complete this exercise __________ times a day. Cervical rotation  1. Using good posture, sit on a stable chair or stand up. 2. Slowly turn your head to the side as if you are looking over your left / right shoulder. ? Keep your eyes level with the ground. ? Stop when you feel a stretch along the side and the back of your neck. 3. Hold for __________ seconds. 4. Repeat this by turning to your other side. Repeat __________ times. Complete this exercise __________ times a day. Thoracic extension and pectoral stretch 1. Roll a towel or a small blanket so it is about 4 inches (10 cm) in diameter. 2. Lie down on your back on a firm surface. 3. Put the towel lengthwise, under your spine in the middle of your back. It should not be under your shoulder blades. The towel should line up with your spine from your middle back to your lower back. 4. Put your hands behind your head and let your elbows fall out to your sides. 5. Hold for __________ seconds. Repeat __________ times. Complete this exercise __________ times a day. Strengthening exercises Isometric upper cervical flexion 1. Lie on your back with a thin pillow behind your head and a  small rolled-up towel under your neck. 2. Gently tuck your chin toward your chest and nod your head down to look toward your feet. Do not lift your head off the pillow. 3. Hold for __________ seconds. 4. Release the tension slowly. Relax your neck muscles completely before you repeat this exercise. Repeat __________ times. Complete this exercise __________ times a day. Isometric cervical extension  1. Stand about 6 inches (15 cm) away from a wall, with your back facing the wall. 2.  Place a soft object, about 6-8 inches (15-20 cm) in diameter, between the back of your head and the wall. A soft object could be a small pillow, a ball, or a folded towel. 3. Gently tilt your head back and press into the soft object. Keep your jaw and forehead relaxed. 4. Hold for __________ seconds. 5. Release the tension slowly. Relax your neck muscles completely before you repeat this exercise. Repeat __________ times. Complete this exercise __________ times a day. Posture and body mechanics Body mechanics refers to the movements and positions of your body while you do your daily activities. Posture is part of body mechanics. Good posture and healthy body mechanics can help to relieve stress in your body's tissues and joints. Good posture means that your spine is in its natural S-curve position (your spine is neutral), your shoulders are pulled back slightly, and your head is not tipped forward. The following are general guidelines for applying improved posture and body mechanics to your everyday activities. Sitting  1. When sitting, keep your spine neutral and keep your feet flat on the floor. Use a footrest, if necessary, and keep your thighs parallel to the floor. Avoid rounding your shoulders, and avoid tilting your head forward. 2. When working at a desk or a computer, keep your desk at a height where your hands are slightly lower than your elbows. Slide your chair under your desk so you are close enough to  maintain good posture. 3. When working at a computer, place your monitor at a height where you are looking straight ahead and you do not have to tilt your head forward or downward to look at the screen. Standing   When standing, keep your spine neutral and keep your feet about hip-width apart. Keep a slight bend in your knees. Your ears, shoulders, and hips should line up.  When you do a task in which you stand in one place for a long time, place one foot up on a stable object that is 2-4 inches (5-10 cm) high, such as a footstool. This helps keep your spine neutral. Resting When lying down and resting, avoid positions that are most painful for you. Try to support your neck in a neutral position. You can use a contour pillow or a small rolled-up towel. Your pillow should support your neck but not push on it. This information is not intended to replace advice given to you by your health care provider. Make sure you discuss any questions you have with your health care provider. Document Released: 08/21/2005 Document Revised: 12/11/2018 Document Reviewed: 05/22/2018 Elsevier Patient Education  2020 [employer].

## 2019-07-29 NOTE — Progress Notes (Signed)
Assessment and Plan:  Latoya [name] was seen today for acute visit, neck pain and fatigue.  Diagnoses and all orders for this visit:  Strain of neck muscle, initial encounter STOP flexeril related to excessive sleepiness -     diclofenac Sodium (VOLTAREN) 1 % GEL; Apply 4 g topically 4 (four) times daily. -     methocarbamol (ROBAXIN-750) 750 MG tablet; Take 1 tablet (750 mg total) by mouth every 8 (eight) hours as needed for muscle spasms. Try Ice, cover with cloth, 62min three times a day.  May also do heat.    Anxiety Doing well on current regiment Continue : Clonazepam 1mg , inderal BID and zoloft 10mg  daily Discussed stress management techniques   Discussed good sleep hygiene Discussed increasing physical activity and exercise Increase water intake  Essential hypertension Controlled today Continue current medications: Cardizem 120mg , Lasix 20mg  Monitor blood pressure at home; call if consistently over 130/80 Continue DASH diet.   Reminder to go to the ER if any CP, SOB, nausea, dizziness, severe HA, changes vision/speech, left arm numbness and tingling and jaw pain.  Medication management Continued    Further disposition pending results of labs. Discussed med's effects and SE's.   Over 30 minutes of exam, counseling, chart review, and critical decision making was performed.   Future Appointments  Date Time Provider [place]  08/07/2019  9:00 AM [name], NP [facility] None  11/06/2019 10:00 AM [name], NP [facility] None  02/03/2020 11:00 AM [name], MD [facility] None    ------------------------------------------------------------------------------------------------------------------   HPI 68 y.o.female presents for evaluation of her neck.  She reports it started about one week ago.  She reports it started one evening at the right side base of her neck down to her back.  She had been outside working in the yard all day.  Reports the pain  started on her right but her left has bothered her as well. She took tylenol 1,000mg  and that did not help.  She did try a heating pad and that did provide some reliefe as well as BioFreeze.  It continues to bother her constantly.  She reports she stays busy and this is beginning to slow her down.   She denies any back pain, shooting pains down her arms, numbness or tingling, decreased muscle tone or weakness, headache vision changes, chest pains or shortness of breath.   Past Medical History:  Diagnosis Date  . Anxiety and depression   . Arthritis   . Colon polyps   . GERD (gastroesophageal reflux disease)   . Hemorrhoids   . Hiatal hernia   . History of kidney stones    passed  . Hyperlipemia   . Mixed hyperlipidemia 11/26/2013  . Panic disorder   . Passive smoke exposure 07/17/2018  . Renal insufficiency    stage 3 kidney disease per her PCP  . Stomach ulcer   . Unspecified essential hypertension 11/26/2013     Allergies  Allergen Reactions  . Other     All pain medications: Unknown/ CODEINE  . Prednisone Other (See Comments)    DYSPHORIA  . Vioxx [Rofecoxib]     unknown  . Entex T [Pseudoephedrine-Guaifenesin] Palpitations  . Levaquin [Levofloxacin] Rash    Tolerated Avelox without adverse effect 11/08/15.  . Sulfa Antibiotics Rash  . Trovan [Alatrofloxacin] Rash    Current Outpatient Medications on File Prior to Visit  Medication Sig  . alendronate (FOSAMAX) 70 MG tablet Take 1 tablet (70 mg total) by mouth once a week.  Take on an empty stomach with a full glass of water.  Marland Kitchen aspirin 81 MG tablet Take 81 mg by mouth at bedtime.   . Biotin 10000 MCG TABS Take by mouth daily.  . cholecalciferol (VITAMIN D) 1000 units tablet Take 1,000 Units by mouth daily.  . clonazePAM (KLONOPIN) 1 MG tablet clonazepam 1 mg tablet  . cyclobenzaprine (FLEXERIL) 10 MG tablet Take 1 tablet (10 mg total) by mouth 3 (three) times daily as needed for muscle spasms.  Marland Kitchen diltiazem (CARDIZEM) 120  MG tablet TAKE 1 TABLET BY MOUTH TWICE DAILY FOR BLOOD PRESSURE  . famotidine (PEPCID) 20 MG tablet 20 mg daily. As needed.  . furosemide (LASIX) 20 MG tablet Take 1 tablet (20 mg total) by mouth daily as needed for edema.  Earley Abide [employer]ounding Base LIQD Take 5 mLs by mouth every 2 (two) hours as needed.  . potassium chloride SA (K-DUR) 20 MEQ tablet Take 1 tablet 2 x /day for Potassium  . pravastatin (PRAVACHOL) 40 MG tablet Take 1 tablet Daily for Cholesterol  . propranolol (INDERAL) 10 MG tablet Take 1 tablet (10 mg total) by mouth 2 (two) times daily as needed (anxiety).  . sertraline (ZOLOFT) 100 MG tablet Take 1 tablet Daily for  Mood  . triamcinolone (NASACORT) 55 MCG/ACT AERO nasal inhaler Place 2 sprays into the nose at bedtime.   No current facility-administered medications on file prior to visit.     ROS: all negative except above.   Physical Exam:  BP 136/78   Pulse 86   Temp 98.6 F (37 C)   Wt 135 lb (61.2 kg)   SpO2 97%   BMI 23.91 kg/m   General Appearance: Well nourished, in no apparent distress. ENT: Hearing normal.  Neck: Supple, thyroid normal.  Respiratory: Respiratory effort normal, BS equal bilaterally without rales, rhonchi, wheezing or stridor. No CVA tenderness. Cardio: RRR with no MRGs. Brisk peripheral pulses without edema.  Musculoskeletal: Full ROM, 5/5 strength, normal gait. Decreased neck ROM.  Tender to semispinalis capitus and splenius capitis.  Skin: Warm, dry without rashes, lesions, ecchymosis.  Neuro: Cranial nerves intact. Normal muscle tone, no cerebellar symptoms. Sensation intact.  Psych: Awake and oriented X 3, normal affect, Insight and Judgment appropriate.     Garnet Sierras, NP 12:17 PM [facility]

## 2019-08-06 NOTE — Progress Notes (Signed)
3 month follow up Assessment and Plan:  Essential hypertension - continue medications, DASH diet, exercise and monitor at home. Call if greater than 130/80.  -     CBC with Differential/Platelet -     CMP/GFR -     TSH  Aortic atherosclerosis ([place]) Per imaging, CT 02/24/2015 Control blood pressure, cholesterol, glucose, increase exercise.   CKD (chronic kidney disease) stage 3, GFR 30-59 ml/min Increase fluids, avoid NSAIDS, monitor sugars, will monitor       -     CMP/GFR  Mixed hyperlipidemia -continue medications, check lipids, decrease fatty foods, increase activity.  -     Lipid panel  Other abnormal glucose Discussed general issues about diabetes pathophysiology and management., Educational material distributed., Suggested low cholesterol diet., Encouraged aerobic exercise., Discussed foot care., Reminded to get yearly retinal exam. Recent A1Cs at goal Discussed diet/exercise, weight management  Defer A1C; check CMP  Medication management -     Magnesium  Gastroesophageal reflux disease/LPR Cough resolved Continue PPI/H2 blocker, diet discussed  Generalized anxiety disorder continue medications, stress management techniques discussed, increase water, good sleep hygiene discussed, increase exercise, and increase veggies.   Vitamin D deficiency Continue supplementation Check vitamin D level  Osteoporosis Declines alendronate Reviewed lifestyle; cont vit D supplement, add calcium 600 mg daily Reviewed weight bearing/high impact exercises Will repeat DEXA in 2 years  Over 30 minutes of exam, counseling, chart review and critical decision making was performed Future Appointments  Date Time Provider [place]  11/06/2019 10:00 AM [name], NP [id number] None  02/03/2020 11:00 AM [name], MD [id number] None     Subjective:  Latoya [name] is a 68 y.o. 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 presents for follow up.   She has newly diagnosed osteoporosis and was prescribed alendronate in 06/2019 but reports never started, doesn't wish to take due to potential SE.   She had neck pain/muscle spasms; couldn't tolerate muscle relaxer, but improved with rest and heat.   She has chronic cough/laryngopharyngeal reflux and takes famotidine 20 mg PRN which she reports has resolved cough.   She is on zoloft on klonopin rarely for anxiety/depression. She reports takes 1/4 tab of klonopin occasionally, few days a month. She is somewhat stressed as her husband has respiratory failure and she does the majority of work around the house.   BMI is Body mass index is 23.63 kg/m., she has been working on diet and exercise.  Wt Readings from Last 3 Encounters:  08/07/19 133 lb 6.4 oz (60.5 kg)  07/29/19 135 lb (61.2 kg)  05/06/19 138 lb (62.6 kg)   She has abdominal aortic atherosclerosis per abd CT 02/24/2015 Her blood pressure has been controlled at home, today their BP is BP: 136/86  Normal ECHO, Dr. Stanford Breed in 10/2017.   She does workout, walking, yard work, house work. She denies chest pain, shortness of breath, dizziness.   She is on cholesterol medication (pravastatin 40 mg daily) and denies myalgias. Her cholesterol is not at goal. The cholesterol last visit was:   Lab Results  Component Value Date   CHOL 172 05/06/2019   HDL 47 (L) 05/06/2019   LDLCALC 102 (H) 05/06/2019   TRIG  135 05/06/2019   CHOLHDL 3.7 05/06/2019    She has been working on diet and exercise for glucose management, and denies paresthesia of the feet,  polydipsia, polyuria and visual disturbances. Last A1C in the office was:  Lab Results  Component Value Date   HGBA1C 5.5 07/03/2018   Last GFR: Lab Results  Component Value Date   GFRNONAA 55 (L) 05/06/2019   Patient is on Vitamin D supplement, has increased to 2000 IU since last visit:    Lab Results  Component Value Date   VD25OH 46 05/06/2019       Medication Review: Current Outpatient Medications on File Prior to Visit  Medication Sig  . aspirin 81 MG tablet Take 81 mg by mouth at bedtime.   . cholecalciferol (VITAMIN D) 1000 units tablet Take 1,000 Units by mouth daily.  . clonazePAM (KLONOPIN) 1 MG tablet clonazepam 1 mg tablet  . diltiazem (CARDIZEM) 120 MG tablet TAKE 1 TABLET BY MOUTH TWICE DAILY FOR BLOOD PRESSURE  . famotidine (PEPCID) 20 MG tablet 20 mg daily. As needed.  . furosemide (LASIX) 20 MG tablet Take 1 tablet (20 mg total) by mouth daily as needed for edema.  . potassium chloride SA (K-DUR) 20 MEQ tablet Take 1 tablet 2 x /day for Potassium  . pravastatin (PRAVACHOL) 40 MG tablet Take 1 tablet Daily for Cholesterol  . propranolol (INDERAL) 10 MG tablet Take 1 tablet (10 mg total) by mouth 2 (two) times daily as needed (anxiety).  . sertraline (ZOLOFT) 100 MG tablet Take 1 tablet Daily for  Mood  . Mouthwash Compounding Base LIQD Take 5 mLs by mouth every 2 (two) hours as needed. (Patient not taking: Reported on 08/07/2019)   No current facility-administered medications on file prior to visit.      Allergies  Allergen Reactions  . Other     All pain medications: Unknown/ CODEINE  . Prednisone Other (See Comments)    DYSPHORIA  . Vioxx [Rofecoxib]     unknown  . Entex T [Pseudoephedrine-Guaifenesin] Palpitations  . Levaquin [Levofloxacin] Rash    Tolerated Avelox without adverse effect 11/08/15.  . Sulfa Antibiotics Rash  . Trovan [Alatrofloxacin] Rash    Current Problems (verified) Patient Active Problem List   Diagnosis Date Noted  .  Osteoporosis 06/23/2019  . FHx: heart disease 11/27/2017  . 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 Laryngopharyngeal reflux (LPR) 10/12/2016  . Chronic rhinitis 10/09/2016  . Hiatal hernia 07/14/2016  . Aortic atherosclerosis ([place]) 06/30/2016  . CKD (chronic kidney disease) stage 3, GFR 30-59 ml/min 06/30/2016  . Thyroid nodule 12/24/2015  . Abnormal glucose 05/01/2014  . Vitamin D deficiency 05/01/2014  . Medication management 05/01/2014  . Essential hypertension 11/26/2013  . Hyperlipidemia, mixed 11/26/2013  . Chronic cough 09/19/2013  . Generalized anxiety disorder 07/11/2013  . Esophageal reflux 11/29/2012    SURGICAL HISTORY She  has a past surgical history that includes Abdominal hysterectomy; Tubal ligation; Foot surgery (Left, 2008); Colonoscopy; Upper gastrointestinal endoscopy; Oophorectomy (Right); Carpometacarpel [facility]) suspension plasty (Right, 12/08/2013); I&D extremity (Right, 01/20/2017); and Hand surgery (Right). FAMILY HISTORY Her family history includes Asthma in her mother; Dementia in her brother; Heart attack in her brother; Heart disease in her brother and father; Pancreatic cancer in her brother. SOCIAL HISTORY She  reports that she is a non-smoker but has been exposed to tobacco smoke. She has never used smokeless tobacco. She reports that she does not drink alcohol or use drugs.   Review of Systems  Constitutional: Negative.  Negative for malaise/fatigue and weight loss.  HENT: Negative.  Negative for hearing loss and tinnitus.   Eyes: Negative.  Negative for blurred vision and double vision.  Respiratory: Negative.  Negative for cough, sputum production, shortness of breath and wheezing.   Cardiovascular: Negative.  Negative for chest pain, palpitations, orthopnea, claudication, leg swelling and PND.  Gastrointestinal: Negative for abdominal pain, blood in stool, constipation, diarrhea, heartburn, melena, nausea and vomiting.  Genitourinary:  Negative for dysuria, flank pain, frequency, hematuria and urgency.  Musculoskeletal: Positive for back pain (Chronic, had injections, declines further interventions). Negative for falls, joint pain and myalgias.  Skin: Negative.  Negative for rash.  Neurological: Negative for dizziness, tingling, tremors, sensory change, speech change, focal weakness, seizures, loss of consciousness, weakness and headaches.  Endo/Heme/Allergies: Negative.  Negative for polydipsia.  Psychiatric/Behavioral: Negative.  Negative for depression, memory loss, substance abuse and suicidal ideas. The patient is not nervous/anxious and does not have insomnia.   All other systems reviewed and are negative.   Objective:     Today's Vitals   08/07/19 0913  BP: 136/86  Pulse: 69  Temp: (!) 97.3 F (36.3 C)  SpO2: 98%  Weight: 133 lb 6.4 oz (60.5 kg)  Height: 5\' 3"  (1.6 m)   Body mass index is 23.63 kg/m.  General appearance: alert, no distress, WD/WN, female HEENT: normocephalic, sclerae anicteric, TMs pearly, nares patent, no discharge or erythema, pharynx normal Oral cavity: MMM, no lesions Neck: supple, no lymphadenopathy, no thyromegaly, no masses Heart: RRR, normal S1, S2, no murmurs Lungs: CTA bilaterally, no wheezes, rhonchi, or rales Abdomen: +bs, soft, generlized tenderness through left and lower quadrants, non distended, no masses, no hepatomegaly, no splenomegaly Musculoskeletal: Full ROM, no swelling, no obvious deformity, normal gait Extremities: no edema, no cyanosis, no clubbing Pulses: 2+ symmetric, upper and lower extremities, normal cap refill Neurological: alert, oriented x 3, CN2-12 intact, strength normal upper extremities and lower extremities, sensation normal throughout, DTRs 2+  no cerebellar signs, gait normal Psychiatric: normal affect, behavior normal, pleasant  Breasts: symmetrical without palpable lumps, texture intake, nipples not inverted, no discharge, no axillary  lymphadenopathy   Izora Ribas, NP   08/07/2019

## 2019-08-07 ENCOUNTER — Other Ambulatory Visit: Payer: Self-pay

## 2019-08-07 ENCOUNTER — Ambulatory Visit (INDEPENDENT_AMBULATORY_CARE_PROVIDER_SITE_OTHER): Payer: PPO | Admitting: Adult Health

## 2019-08-07 ENCOUNTER — Encounter: Payer: Self-pay | Admitting: Adult Health

## 2019-08-07 VITALS — BP 136/86 | HR 69 | Temp 97.3°F | Ht 63.0 in | Wt 133.4 lb

## 2019-08-07 DIAGNOSIS — I10 Essential (primary) hypertension: Secondary | ICD-10-CM

## 2019-08-07 DIAGNOSIS — K219 Gastro-esophageal reflux disease without esophagitis: Secondary | ICD-10-CM | POA: Diagnosis not present

## 2019-08-07 DIAGNOSIS — E782 Mixed hyperlipidemia: Secondary | ICD-10-CM

## 2019-08-07 DIAGNOSIS — I700 Atherosclerosis of aorta: Secondary | ICD-10-CM | POA: Diagnosis not present

## 2019-08-07 DIAGNOSIS — R7309 Other abnormal glucose: Secondary | ICD-10-CM

## 2019-08-07 DIAGNOSIS — E559 Vitamin D deficiency, unspecified: Secondary | ICD-10-CM

## 2019-08-07 DIAGNOSIS — F411 Generalized anxiety disorder: Secondary | ICD-10-CM

## 2019-08-07 DIAGNOSIS — N1831 Chronic kidney disease, stage 3a: Secondary | ICD-10-CM

## 2019-08-07 DIAGNOSIS — Z79899 Other long term (current) drug therapy: Secondary | ICD-10-CM

## 2019-08-07 NOTE — Patient Instructions (Addendum)
Goals    . DIET - INCREASE WATER INTAKE     65-80+    . Exercise 150 min/wk Moderate Activity     Exercises to help prevent osteoporosis daily           Osteoporosis  Osteoporosis happens when your bones get thin and weak. This can cause your bones to break (fracture) more easily. You can do things at home to make your bones stronger. Follow these instructions at home:  Activity  Exercise as told by your doctor. Ask your doctor what activities are safe for you. You should do: ? Exercises that make your muscles work to hold your body weight up (weight-bearing exercises). These include tai chi, yoga, and walking. ? Exercises to make your muscles stronger. One example is lifting weights. Lifestyle  Limit alcohol intake to no more than 1 drink a day for nonpregnant women and 2 drinks a day for men. One drink equals 12 oz of beer, 5 oz of wine, or 1 oz of hard liquor.  Do not use any products that have nicotine or tobacco in them. These include cigarettes and e-cigarettes. If you need help quitting, ask your doctor. Preventing falls  Use tools to help you move around (mobility aids) as needed. These include canes, walkers, scooters, and crutches.  Keep rooms well-lit and free of clutter.  Put away things that could make you trip. These include cords and rugs.  Install safety rails on stairs. Install grab bars in bathrooms.  Use rubber mats in slippery areas, like bathrooms.  Wear shoes that: ? Fit you well. ? Support your feet. ? Have closed toes. ? Have rubber soles or low heels.  Tell your doctor about all of the medicines you are taking. Some medicines can make you more likely to fall. General instructions  Eat plenty of calcium and vitamin D. These nutrients are good for your bones. Good sources of calcium and vitamin D include: ? Some fatty fish, such as salmon and tuna. ? Foods that have calcium and vitamin D added to them (fortified foods). For example, some  breakfast cereals are fortified with calcium and vitamin D. ? Egg yolks. ? Cheese. ? Liver.  Take over-the-counter and prescription medicines only as told by your doctor.  Keep all follow-up visits as told by your doctor. This is important. Contact a doctor if:  You have not been tested (screened) for osteoporosis and you are: ? A woman who is age 41 or older. ? A man who is age 71 or older. Get help right away if:  You fall.  You get hurt. Summary  Osteoporosis happens when your bones get thin and weak.  Weak bones can break (fracture) more easily.  Eat plenty of calcium and vitamin D. These nutrients are good for your bones.  Tell your doctor about all of the medicines that you take. This information is not intended to replace advice given to you by your health care provider. Make sure you discuss any questions you have with your health care provider. Document Released: 11/13/2011 Document Revised: 08/03/2017 Document Reviewed: 06/15/2017 Elsevier Patient Education  2020 [employer].

## 2019-08-08 ENCOUNTER — Other Ambulatory Visit: Payer: Self-pay

## 2019-08-08 LAB — COMPLETE METABOLIC PANEL WITH GFR
AG Ratio: 1.6 (calc) (ref 1.0–2.5)
ALT: 13 U/L (ref 6–29)
AST: 23 U/L (ref 10–35)
Albumin: 4 g/dL (ref 3.6–5.1)
Alkaline phosphatase (APISO): 85 U/L (ref 37–153)
BUN/Creatinine Ratio: 16 (calc) (ref 6–22)
BUN: 18 mg/dL (ref 7–25)
CO2: 25 mmol/L (ref 20–32)
Calcium: 9.5 mg/dL (ref 8.6–10.4)
Chloride: 109 mmol/L (ref 98–110)
Creat: 1.12 mg/dL — ABNORMAL HIGH (ref 0.50–0.99)
GFR, Est African American: 58 mL/min/{1.73_m2} — ABNORMAL LOW (ref >=60)
GFR, Est Non African American: 50 mL/min/{1.73_m2} — ABNORMAL LOW (ref >=60)
Globulin: 2.5 g/dL (calc) (ref 1.9–3.7)
Glucose, Bld: 47 mg/dL — ABNORMAL LOW (ref 65–99)
Potassium: 3.9 mmol/L (ref 3.5–5.3)
Sodium: 142 mmol/L (ref 135–146)
Total Bilirubin: 0.3 mg/dL (ref 0.2–1.2)
Total Protein: 6.5 g/dL (ref 6.1–8.1)

## 2019-08-08 LAB — CBC WITH DIFFERENTIAL/PLATELET
Absolute Monocytes: 497 cells/uL (ref 200–950)
Basophils Absolute: 28 cells/uL (ref 0–200)
Basophils Relative: 0.6 %
Eosinophils Absolute: 101 cells/uL (ref 15–500)
Eosinophils Relative: 2.2 %
HCT: 42 % (ref 35.0–45.0)
Hemoglobin: 13.8 g/dL (ref 11.7–15.5)
Lymphs Abs: 1063 cells/uL (ref 850–3900)
MCH: 30.3 pg (ref 27.0–33.0)
MCHC: 32.9 g/dL (ref 32.0–36.0)
MCV: 92.1 fL (ref 80.0–100.0)
MPV: 9.9 fL (ref 7.5–12.5)
Monocytes Relative: 10.8 %
Neutro Abs: 2912 cells/uL (ref 1500–7800)
Neutrophils Relative %: 63.3 %
Platelets: 211 10*3/uL (ref 140–400)
RBC: 4.56 10*6/uL (ref 3.80–5.10)
RDW: 13 % (ref 11.0–15.0)
Total Lymphocyte: 23.1 %
WBC: 4.6 10*3/uL (ref 3.8–10.8)

## 2019-08-08 LAB — LIPID PANEL
Cholesterol: 179 mg/dL (ref <200)
HDL: 55 mg/dL (ref >=50)
LDL Cholesterol (Calc): 104 mg/dL (calc) — ABNORMAL HIGH
Non-HDL Cholesterol (Calc): 124 mg/dL (calc) (ref <130)
Total CHOL/HDL Ratio: 3.3 (calc) (ref <5.0)
Triglycerides: 108 mg/dL (ref <150)

## 2019-08-08 LAB — TSH: TSH: 2.08 mIU/L (ref 0.40–4.50)

## 2019-08-08 LAB — MAGNESIUM: Magnesium: 2.1 mg/dL (ref 1.5–2.5)

## 2019-08-08 MED ORDER — PRAVASTATIN SODIUM 40 MG PO TABS: ORAL_TABLET | ORAL | 1 refills | Status: DC

## 2019-08-25 ENCOUNTER — Other Ambulatory Visit: Payer: Self-pay | Admitting: Physician Assistant

## 2019-08-31 ENCOUNTER — Other Ambulatory Visit: Payer: Self-pay | Admitting: Internal Medicine

## 2019-08-31 MED ORDER — SERTRALINE HCL 100 MG PO TABS: ORAL_TABLET | ORAL | 3 refills | Status: DC

## 2019-09-09 NOTE — Progress Notes (Signed)
69 y.o. female  presents with URI symptoms for 5 days. Patient has congestion, post nasal drip, sinus pressure and non productive cough Denies sore throat, productive cough, achiness and low grade fevers.  Patient has tried cough suppressant of choice and mucinex without relief. Smoking status: never smoked.  No known exposure to corona virus. Did not have any family gatherings for holidays.   Blood pressure 124/84, pulse 81, temperature 97.6 F (36.4 C), weight 134 lb 9.6 oz (61.1 kg), SpO2 97 %.  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  Medications Current Outpatient Medications on File Prior to Visit  Medication Sig  . aspirin 81 MG tablet Take 81 mg by mouth at bedtime.   . cholecalciferol (VITAMIN D) 1000 units tablet Take 1,000 Units by mouth daily.  . clonazePAM (KLONOPIN) 1 MG tablet clonazepam 1 mg tablet  . diltiazem (CARDIZEM) 120 MG tablet TAKE 1 TABLET BY MOUTH TWICE DAILY FOR BLOOD PRESSURE  . famotidine (PEPCID) 20 MG tablet 20 mg daily. As needed.  . furosemide (LASIX) 20 MG tablet Take 1 tablet (20 mg total) by mouth daily as needed for edema.  [place] Compounding Base LIQD Take 5 mLs by mouth every 2 (two) hours as needed.  . potassium chloride SA (K-DUR) 20 MEQ tablet Take 1 tablet 2 x /day for Potassium  . pravastatin (PRAVACHOL) 40 MG tablet Take 1 tablet Daily for Cholesterol  . propranolol (INDERAL) 10 MG tablet Take 1 tablet (10 mg total) by mouth 2 (two) times daily as needed (anxiety).  . sertraline (ZOLOFT) 100 MG tablet Take 1 tablet Daily for  Mood   No  current facility-administered medications on file prior to visit.    Allergies: Allergies  Allergen Reactions  . Other     All pain medications: Unknown/ CODEINE  . Prednisone Other (See Comments)    DYSPHORIA  . Vioxx [Rofecoxib]     unknown  . Entex T [Pseudoephedrine-Guaifenesin] Palpitations  . Levaquin [Levofloxacin] Rash    Tolerated Avelox without adverse effect 11/08/15.  . Sulfa Antibiotics Rash  . Trovan [Alatrofloxacin] Rash    ROS: See HPI  Physical Exam Physical Exam Constitutional:      Appearance: She is well-developed.  HENT:     Head: Normocephalic and atraumatic.     Right Ear: External ear normal.     Nose:     Right Sinus: Maxillary sinus tenderness present. No frontal sinus tenderness.     Left Sinus: Maxillary sinus tenderness present. No frontal sinus tenderness.  Eyes:     Conjunctiva/sclera: Conjunctivae normal.  Cardiovascular:     Rate and Rhythm: Normal rate and regular rhythm.     Heart sounds: Normal heart sounds.  Pulmonary:     Effort: Pulmonary effort is normal. No respiratory distress.     Breath sounds: Normal breath sounds. No wheezing.  Abdominal:     General: Bowel sounds are normal.     Palpations: Abdomen is soft.  Musculoskeletal:     Cervical back: Normal range of motion and neck supple.  Lymphadenopathy:     Cervical: No cervical adenopathy.  Skin:    General: Skin is warm and dry.     Assessment and Plan: Hassan Rowan  was seen today for acute visit.  Diagnoses and all orders for this visit:  Acute non-recurrent maxillary sinusitis  Other orders -     doxycycline (VIBRAMYCIN) 100 MG capsule; Take 1 capsule twice daily with food -     predniSONE (DELTASONE) 10 MG tablet; 2 tablets daily for 3 days, 1 tablet daily for 4 days.  Will hold the DOXY and take if she is not getting better, increase fluids, rest, cont allergy pill GET covid TESTING, ISOLATE  If you are not getting better call the office for an office visit.  If you are getting worse go to the ER.

## 2019-09-10 ENCOUNTER — Encounter: Payer: Self-pay | Admitting: Physician Assistant

## 2019-09-10 ENCOUNTER — Ambulatory Visit (INDEPENDENT_AMBULATORY_CARE_PROVIDER_SITE_OTHER): Payer: PPO | Admitting: Physician Assistant

## 2019-09-10 ENCOUNTER — Other Ambulatory Visit: Payer: Self-pay

## 2019-09-10 VITALS — BP 124/84 | HR 81 | Temp 97.6°F | Wt 134.6 lb

## 2019-09-10 DIAGNOSIS — J0100 Acute maxillary sinusitis, unspecified: Secondary | ICD-10-CM

## 2019-09-10 MED ORDER — DOXYCYCLINE HYCLATE 100 MG PO CAPS: ORAL_CAPSULE | ORAL | 0 refills | Status: DC

## 2019-09-10 MED ORDER — PREDNISONE 10 MG PO TABS: ORAL_TABLET | ORAL | 0 refills | Status: DC

## 2019-09-10 NOTE — Patient Instructions (Signed)
[facility] is now requiring appointments for testing, you should be able to request an appointment by going to [email or web address] or by texting "COVID" to [phone].  You can also call [phone] for more information about community testing.   There are also several urgent cares that are testing, the PCR send out is a better test than the rapid test.  The pt's will remain in the car and wear a mask when going for testing.The staff will come to the car to perform testing. The pt's will need to bring an ID. People are getting results in 24-72 hours.  During this time please quarantine you and your family.   [place] ([place])  [facility]    To stop home isolation IF you test positive, you will need all three of the statements below.  1) You need to be fever free for 3 days WITHOUT tylenol.  2) Your symptoms such as cough, shortness of breath, diarrhea, etc need to improve.  3) It needs to be at least 10 days since your symptoms first appeared    After you stop home isolation, please continue to wear a mask in public and continue hand washing and contact precautions.   Things to do if you test positive: Please do vitamin C 1000mg  twice a day- stop if you have any worsening heart burn or diarrhea.   You can take tylenol for fevers above 101 if you feel uncomfortable. Remember a fever itself is not bad, the tylenol is more for your comfort. See the max of tylenol below.  Please do breathing exercises. Make sure that you are taking deep breaths and trying to walk around the room as much as possible.  Please lay "prone" or on your belly for up to 4 hours a day, you can split this up to 30 minute or 1 hour increments but this helps get oxygen to certain places of your lungs.  Please get on 81 mg of aspirin unless you are unable to tolerate this or have a  contraindication.  Please pump your feet like your are pedaling a bike to prevent clots in your legs.   Push fluids with Gatorade or electrolyte replacement in addition to water, aim for 100-120 oz a day if no contraindications.  If you have worsening shortness of breath, unable to complete full sentences, are panting, please contact the office immediately for call 911. Let them know you are being monitored for coronavirus.

## 2019-09-24 ENCOUNTER — Ambulatory Visit (INDEPENDENT_AMBULATORY_CARE_PROVIDER_SITE_OTHER): Payer: PPO | Admitting: Adult Health

## 2019-09-24 ENCOUNTER — Encounter: Payer: Self-pay | Admitting: Adult Health

## 2019-09-24 ENCOUNTER — Other Ambulatory Visit: Payer: Self-pay

## 2019-09-24 ENCOUNTER — Other Ambulatory Visit: Payer: Self-pay | Admitting: Adult Health

## 2019-09-24 VITALS — BP 124/72 | HR 86 | Temp 97.3°F | Wt 136.0 lb

## 2019-09-24 DIAGNOSIS — J310 Chronic rhinitis: Secondary | ICD-10-CM

## 2019-09-24 MED ORDER — FEXOFENADINE HCL 180 MG PO TABS: 180.0000 mg | ORAL_TABLET | Freq: Every day | ORAL | 2 refills | Status: DC

## 2019-09-24 MED ORDER — FLUTICASONE PROPIONATE 50 MCG/ACT NA SUSP: 2.0000 | Freq: Every day | NASAL | 1 refills | Status: DC

## 2019-09-24 NOTE — Progress Notes (Signed)
Assessment and Plan:  Latoya [name] was seen today for flank pain and sinus problem.  Diagnoses and all orders for this visit:  Chronic rhinitis Nonacute, chronic symptoms ongoing since prior to 2018 intermittently on review Steroid taper helped but discussed this is not a reasonable long term solution; should reserve for severe flares, true sinusitis Benign exam today; Discussed the importance of avoiding unnecessary antibiotic therapy. Suggested symptomatic OTC remedies. Continue nasal saline spray for congestion. Nasal steroids, allergy pill sent in and adherence emphasized Check home for dusk, mold; vacuum, dusk, change filters Follow up as needed. -     fexofenadine (ALLEGRA) 180 MG tablet; Take 1 tablet (180 mg total) by mouth daily. -     fluticasone (FLONASE) 50 MCG/ACT nasal spray; Place 2 sprays into both nostrils at bedtime.  Abdominal cramp Reports intermittent ongoing sx; no sx at time of exam; benign exam Monitor sx closely; try tylenol extra strength 1-2 tabs TID PRN pain; suggested she try heat application Monitor sx and keep a log of patterns, associated factors; follow up if needed.  Please go to the ER if you have any severe AB pain, unable to hold down food/water, blood in stool or vomit, chest pain, shortness of breath, or any worsening symptoms.    Further disposition pending results of labs. Discussed med's effects and SE's.   Over 30 minutes of exam, counseling, chart review, and critical decision making was performed.   Future Appointments  Date Time Provider [place]  11/06/2019 10:00 AM Latoya Comber, NP [id number] None  02/03/2020 10:40 AM Latoya Kingdom, MD [id number] None    ------------------------------------------------------------------------------------------------------------------   HPI BP 124/72   Pulse 86   Temp (!) 97.3 F (36.3 C)   Wt 136 lb (61.7 kg)   SpO2 97%   BMI 24.09 kg/m   69 y.o.female with hx of chronic rhinitis,  laryngopharyngeal reflux/GERD, hiatal hernia presents for evaluation due to congestion, sinus fullness without pain, sinus headache, hoarseness, postnasal drip ongoing for 3 weeks. Symptoms are reportedly stable, not notably progressive. Denies fever/chills. Endorse occasional non-productive cough.   She was evaluated on 09/10/2019 by Latoya Bamberg, PA and prescribed prednisone and doxycycline which resolved her episode from 1 month ago, then symptoms came back once she finished; has hx of chronic symptoms, does endorse some itching, sneezing. Denies any daily/maintenance medications such as antihistamine or nasal spray medications, has been doing some occasional mucus relief medication and saline irrigations which doesn't help much. She has been using humidifier without notable improvement.   Husband does not have notable symptoms. No known sick exposures, begin very careful with social distancing due to husband being very high risk.   She reports ongoing intermittent LLQ/flank cramping pain intermittently; was "bad" yesterday, hasn't tried anything, resolved today. Denies constipation, diarrhea, nausea/vomiting; denies urinary sx, vaginal discharge. Reports symptoms are fully resolved today. Colonoscopy in 2014 with benign polyp, no diverticulosis.   Past Medical History:  Diagnosis Date  . Anxiety and depression   . Arthritis   . Colon polyps   . GERD (gastroesophageal reflux disease)   . Hemorrhoids   . Hiatal hernia   . History of kidney stones    passed  . Hyperlipemia   . Mixed hyperlipidemia 11/26/2013  . Panic disorder   . Passive smoke exposure 07/17/2018  . Renal insufficiency    stage 3 kidney disease per her PCP  . Stomach ulcer   . Unspecified essential hypertension 11/26/2013     Allergies  Allergen Reactions  .  Other     All pain medications: Unknown/ CODEINE  . Prednisone Other (See Comments)    DYSPHORIA  . Vioxx [Rofecoxib]     unknown  . Entex T  [Pseudoephedrine-Guaifenesin] Palpitations  . Levaquin [Levofloxacin] Rash    Tolerated Avelox without adverse effect 11/08/15.  . Sulfa Antibiotics Rash  . Trovan [Alatrofloxacin] Rash    Current Outpatient Medications on File Prior to Visit  Medication Sig  . aspirin 81 MG tablet Take 81 mg by mouth at bedtime.   . cholecalciferol (VITAMIN D) 1000 units tablet Take 1,000 Units by mouth daily.  . clonazePAM (KLONOPIN) 1 MG tablet clonazepam 1 mg tablet  . diltiazem (CARDIZEM) 120 MG tablet TAKE 1 TABLET BY MOUTH TWICE DAILY FOR BLOOD PRESSURE  . famotidine (PEPCID) 20 MG tablet 20 mg daily. As needed.  . furosemide (LASIX) 20 MG tablet Take 1 tablet (20 mg total) by mouth daily as needed for edema.  Latoya [name] [employer] LIQD Take 5 mLs by mouth every 2 (two) hours as needed.  . potassium chloride SA (K-DUR) 20 MEQ tablet Take 1 tablet 2 x /day for Potassium  . pravastatin (PRAVACHOL) 40 MG tablet Take 1 tablet Daily for Cholesterol  . propranolol (INDERAL) 10 MG tablet Take 1 tablet (10 mg total) by mouth 2 (two) times daily as needed (anxiety).  . sertraline (ZOLOFT) 100 MG tablet Take 1 tablet Daily for  Mood  . doxycycline (VIBRAMYCIN) 100 MG capsule Take 1 capsule twice daily with food  . predniSONE (DELTASONE) 10 MG tablet 2 tablets daily for 3 days, 1 tablet daily for 4 days.   No current facility-administered medications on file prior to visit.    ROS: all negative except above.   Physical Exam:  BP 124/72   Pulse 86   Temp (!) 97.3 F (36.3 C)   Wt 136 lb (61.7 kg)   SpO2 97%   BMI 24.09 kg/m   General Appearance: Well nourished, in no apparent distress. Eyes: PERRLA, conjunctiva no swelling or erythema Sinuses: No Frontal/maxillary tenderness, erythema, edema.  ENT/Mouth: Ext aud canals clear, TMs without erythema, bulging. No erythema, swelling, or exudate on post pharynx. She has clear nasal discharge, turbinates pale. Tonsils not swollen or erythematous.  Hearing normal. Hoarse vocal quality. No cough for duration of visit.  Neck: Supple, thyroid normal.  Respiratory: Respiratory effort normal, BS equal bilaterally without rales, rhonchi, wheezing or stridor.  Cardio: RRR with no MRGs. Brisk peripheral pulses without edema.  Abdomen: Soft, + BS.  Non tender. Lymphatics: Non tender without lymphadenopathy.  Musculoskeletal: No obvious deformity, normal gait.  Skin: Warm, dry without rashes, lesions, ecchymosis.  Neuro: Cranial nerves intact. Normal muscle tone, Sensation intact.  Psych: Awake and oriented X 3, normal affect, Insight and Judgment appropriate.     Izora Ribas, NP 10:50 AM [facility]

## 2019-09-24 NOTE — Patient Instructions (Addendum)
Get on a daily allergy medication- fexofenadine/allegra   Nasal sprays - flonase/fluticasone - 1-2 sprays in each nostril once or twice daily   Tylenol as needed for back/flank pain, can do extra strength 1-2 tabs three times a day      -No antibiotics are needed  -There are a lot of combination medications (Dayquil, Nyquil, Vicks 44, tyelnol cold and sinus, ETC). Please look at the ingredients on the back so that you are treating the correct symptoms and not doubling up on medications/ingredients.    Medicines you can use  Allergy symptoms (cough, sneeze, runny nose, itchy eyes) -Claritin or loratadine cheapest but likely the weakest  -Zyrtec or certizine at night because it can make you sleepy -The strongest is allegra or fexafinadine  Cheapest at [employer], [employer], [employer]  Nasal congestion  Little Remedies saline spray (aerosol/mist)- can try this, it is in the kids section - pseudoephedrine (Sudafed)- behind the counter, do not use if you have high blood pressure, medicine that have -D in them.  - phenylephrine (Sudafed PE) -Dextormethorphan + chlorpheniramine (Coridcidin HBP)- okay if you have high blood pressure -Oxymetazoline (Afrin) nasal spray- LIMIT to 3 days -Saline nasal spray -Neti pot (used distilled or bottled water)  Ear pain/congestion  -pseudoephedrine (sudafed) - Nasonex/flonase nasal spray  Fever  -Acetaminophen (Tyelnol) -Ibuprofen (Advil, motrin, aleve)  Sore Throat  -Acetaminophen (Tyelnol) -Ibuprofen (Advil, motrin, aleve) -Drink a lot of water -Gargle with salt water - Rest your voice (don't talk) -Throat sprays -Cough drops  Body Aches  -Acetaminophen (Tyelnol) -Ibuprofen (Advil, motrin, aleve)  Headache  -Acetaminophen (Tyelnol) -Ibuprofen (Advil, motrin, aleve) - Exedrin, Exedrin Migraine    Cough  -Dextromethorphan (Delsym)- medicine that has DM in it -Guafenesin (Mucinex/Robitussin) - cough drops - drink lots of water  Chest  Congestion  -Guafenesin (Mucinex/Robitussin)  Red Itchy Eyes  - Naphcon-     Allergic Rhinitis, Adult Allergic rhinitis is an allergic reaction that affects the mucous membrane inside the nose. It causes sneezing, a runny or stuffy nose, and the feeling of mucus going down the back of the throat (postnasal drip). Allergic rhinitis can be mild to severe. There are two types of allergic rhinitis:  Seasonal. This type is also called hay fever. It happens only during certain seasons.  Perennial. This type can happen at any time of the year. What are the causes? This condition happens when the body's defense system (immune system) responds to certain harmless substances called allergens as though they were germs.  Seasonal allergic rhinitis is triggered by pollen, which can come from grasses, trees, and weeds. Perennial allergic rhinitis may be caused by:  House dust mites.  Pet dander.  Mold spores. What are the signs or symptoms? Symptoms of this condition include:  Sneezing.  Runny or stuffy nose (nasal congestion).  Postnasal drip.  Itchy nose.  Tearing of the eyes.  Trouble sleeping.  Daytime sleepiness. How is this diagnosed? This condition may be diagnosed based on:  Your medical history.  A physical exam.  Tests to check for related conditions, such as: ? Asthma. ? Pink eye. ? Ear infection. ? Upper respiratory infection.  Tests to find out which allergens trigger your symptoms. These may include skin or blood tests. How is this treated? There is no cure for this condition, but treatment can help control symptoms. Treatment may include:  Taking medicines that block allergy symptoms, such as antihistamines. Medicine may be given as a shot, nasal spray, or pill.  Avoiding the  allergen.  Desensitization. This treatment involves getting ongoing shots until your body becomes less sensitive to the allergen. This treatment may be done if other treatments do  not help.  If taking medicine and avoiding the allergen does not work, new, stronger medicines may be prescribed. Follow these instructions at home:  Find out what you are allergic to. Common allergens include smoke, dust, and pollen.  Avoid the things you are allergic to. These are some things you can do to help avoid allergens: ? Replace carpet with wood, tile, or vinyl flooring. Carpet can trap dander and dust. ? Do not smoke. Do not allow smoking in your home. ? Change your heating and air conditioning filter at least once a month. ? During allergy season:  Keep windows closed as much as possible.  Plan outdoor activities when pollen counts are lowest. This is usually during the evening hours.  When coming indoors, change clothing and shower before sitting on furniture or bedding.  Take over-the-counter and prescription medicines only as told by your health care provider.  Keep all follow-up visits as told by your health care provider. This is important. Contact a health care provider if:  You have a fever.  You develop a persistent cough.  You make whistling sounds when you breathe (you wheeze).  Your symptoms interfere with your normal daily activities. Get help right away if:  You have shortness of breath. Summary  This condition can be managed by taking medicines as directed and avoiding allergens.  Contact your health care provider if you develop a persistent cough or fever.  During allergy season, keep windows closed as much as possible. This information is not intended to replace advice given to you by your health care provider. Make sure you discuss any questions you have with your health care provider. Document Revised: 08/03/2017 Document Reviewed: 09/28/2016 Elsevier Patient Education  2020 [employer].

## 2019-09-30 DIAGNOSIS — J069 Acute upper respiratory infection, unspecified: Secondary | ICD-10-CM | POA: Diagnosis not present

## 2019-10-06 ENCOUNTER — Telehealth: Payer: Self-pay | Admitting: Physician Assistant

## 2019-10-06 NOTE — Telephone Encounter (Signed)
patient called office to request advise on continued LLQ pain at hip, front and back. She states spoke to [employer] regarding at 09/24/19 appointment, still ongoing and very painful. States stabbing pain at left hip moves to front and back of hip only on left side when she bends over. Requested xray, and or any other recommendations.

## 2019-10-30 DIAGNOSIS — M47816 Spondylosis without myelopathy or radiculopathy, lumbar region: Secondary | ICD-10-CM | POA: Diagnosis not present

## 2019-10-30 DIAGNOSIS — M545 Low back pain: Secondary | ICD-10-CM | POA: Diagnosis not present

## 2019-11-05 NOTE — Progress Notes (Signed)
Complete Physical  Assessment and Plan:  Encounter for Medicare annual wellness exam  Essential hypertension - continue medications, DASH diet, exercise and monitor at home. Call if greater than 130/80.  -     CBC with Differential/Platelet -     CMP/GFR -     TSH  Aortic atherosclerosis ([place]) Per CT abd 2016 Control blood pressure, cholesterol, glucose, increase exercise.   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  -     Lipid panel  Other abnormal glucose Discussed general issues about diabetes pathophysiology and management., Educational material distributed., Suggested low cholesterol diet., Encouraged aerobic exercise., Discussed foot care., Reminded to get yearly retinal exam.  Medication management -     Magnesium  Hiatal hernia Continue GERD medication, follow up GI as needed Avoid lying down after meals   Chronic rhinitis - Allegra OTC, increase H20, allergy hygiene explained.  Gastroesophageal reflux disease, esophagitis presence not specified/LPR Continue H2 blocker, Add diet discussed  Generalized anxiety disorder Zoloft daily, USE THE KLONOPIN IF NEEDED stress management techniques discussed, increase water, good sleep hygiene discussed, increase exercise, and increase veggies.   Vitamin D deficiency Continue to recommend supplementation for goal of 70-100 Check vitamin D level  Chronic cough syndrome Continue GERD meds, allergy medications, follow up Dr. Melvyn Novas as needed.   Osteoporosis - get dexa 06/2021, continue alendronate, continue Vit D and Ca, weight bearing exercises    Discussed  med's effects and SE's. Screening labs and tests as requested with regular follow-up as recommended. Over 40 minutes of exam, counseling, chart review, and complex, high level critical decision making was performed this visit.   Future Appointments  Date Time Provider [place]  02/03/2020 10:40 AM [name], MD [id number] None  11/08/2020 10:00 AM [name], NP [id number] None     HPI  69 y.o. female  presents for a complete physical and follow up for has Esophageal reflux; Generalized anxiety disorder; Chronic cough; Essential hypertension; Hyperlipidemia, mixed; Abnormal glucose; Vitamin D deficiency; Medication management; Thyroid nodule; Aortic atherosclerosis (HCC); CKD (chronic kidney disease) stage 3, GFR 30-59 ml/min; Hiatal hernia; Chronic rhinitis; Laryngopharyngeal reflux (LPR); Multiple lentigines syndrome ([place]); FHx: heart disease; and Osteoporosis on their problem list..  She has newly diagnosed osteoporosis T-2.7 and was prescribed alendronate in 06/2019, initially stated didn't want to take due to potential SE but today states has been taking weekly without concerns when she remembers.    She has chronic cough/laryngopharyngeal reflux and takes famotidine 20 mg PRN which she reports has resolved cough.    She is on zoloft on klonopin rarely for generalized anxiety. She reports takes 1/4 tab of klonopin occasionally, few days a month. She is somewhat stressed as her husband has respiratory failure and she does the majority of work around the house.   BMI is Body mass index is 24.27 kg/m., she has not been working on diet and exercise, but "always moving."  Wt Readings from Last 3 Encounters:  11/06/19 137 lb (62.1 kg)  09/24/19 136 lb (61.7 kg)  09/10/19 134 lb 9.6 oz (61.1 kg)   She has aortic atherosclerosis per CT 2016.  Her blood pressure has been  controlled at home, today their BP is BP: 124/72 She does not workout. She denies chest pain, shortness  of breath, dizziness.   She is on cholesterol medication (pravastatin 40 mg daily) and denies myalgias. Her cholesterol is not at goal. The cholesterol last visit was:   Lab Results  Component Value Date   CHOL 179 08/07/2019   HDL 55 08/07/2019   LDLCALC 104 (H) 08/07/2019   TRIG 108 08/07/2019   CHOLHDL 3.3 08/07/2019   She has been working on diet and exercise for glucose, she is on bASA, and denies increased appetite, nausea, paresthesia of the feet, polydipsia, polyuria and visual disturbances. Last A1C in the office was:  Lab Results  Component Value Date   HGBA1C 5.5 07/03/2018   Last GFR: Lab Results  Component Value Date   GFRNONAA 50 (L) 08/07/2019   Patient is on Vitamin D supplement, reports taking 10000 IU daily    Lab Results  Component Value Date   VD25OH 46 05/06/2019       Current Medications:  Current Outpatient Medications on File Prior to Visit  Medication Sig Dispense Refill  . aspirin 81 MG tablet Take 81 mg by mouth at bedtime.     . cholecalciferol (VITAMIN D) 1000 units tablet Take 1,000 Units by mouth daily.    . clonazePAM (KLONOPIN) 1 MG tablet clonazepam 1 mg tablet    . diltiazem (CARDIZEM) 120 MG tablet TAKE 1 TABLET BY MOUTH TWICE DAILY FOR BLOOD PRESSURE 180 tablet 1  . famotidine (PEPCID) 20 MG tablet 20 mg daily. As needed.    . fexofenadine (ALLEGRA) 180 MG tablet Take 1 tablet (180 mg total) by mouth daily. 30 tablet 2  . fluticasone (FLONASE) 50 MCG/ACT nasal spray SHAKE LIQUID AND USE 2 SPRAYS IN EACH NOSTRIL AT BEDTIME 48 g 2  . furosemide (LASIX) 20 MG tablet Take 1 tablet (20 mg total) by mouth daily as needed for edema. 90 tablet 1  . Mouthwash Compounding Base LIQD Take 5 mLs by mouth every 2 (two) hours as needed. 240 mL 0  . potassium chloride SA (K-DUR) 20 MEQ tablet Take 1 tablet 2 x /day for Potassium 180 tablet 3  . pravastatin (PRAVACHOL) 40 MG tablet Take 1 tablet Daily for Cholesterol 90 tablet 1  . propranolol (INDERAL)  10 MG tablet Take 1 tablet (10 mg total) by mouth 2 (two) times daily as needed (anxiety). 60 tablet 11  . sertraline (ZOLOFT) 100 MG tablet Take 1 tablet Daily for  Mood 90 tablet 3   No current facility-administered medications on file prior to visit.   Allergies:  Allergies  Allergen Reactions  . Other     All pain medications: Unknown/ CODEINE  . Prednisone Other (See Comments)    DYSPHORIA  . Vioxx [Rofecoxib]     unknown  . Entex T [Pseudoephedrine-Guaifenesin] Palpitations  . Levaquin [Levofloxacin] Rash    Tolerated Avelox without adverse effect 11/08/15.  . Sulfa Antibiotics Rash  . Trovan [Alatrofloxacin] Rash   Medical History:  She has Esophageal reflux; Generalized anxiety disorder; Chronic cough; Essential hypertension; Hyperlipidemia, mixed; Abnormal glucose; Vitamin D deficiency; Medication management; Thyroid nodule; Aortic atherosclerosis (HCC); CKD (chronic kidney disease) stage 3, GFR 30-59 ml/min; Hiatal hernia; Chronic rhinitis; Laryngopharyngeal reflux (LPR); Multiple lentigines syndrome ([place]); FHx: heart disease; and Osteoporosis on their problem list. Health Maintenance:   Immunization History  Administered Date(s) Administered  . Influenza Split 06/04/2014, 06/05/2015  . Influenza, High Dose Seasonal PF 05/30/2017, 05/22/2018,  05/21/2019  . Influenza-Unspecified 06/06/2013, 06/13/2016  . Pneumococcal Conjugate-13 06/05/2015  . Pneumococcal Polysaccharide-23 04/06/2014  . Pneumococcal-Unspecified 09/04/1996  . Td 09/04/2005  . Tdap 03/22/2017    Preventative care: Last colonoscopy: 12/2012 Carlean Purl, 10 year follow up EGD 10/2017 Last mammogram: 06/2019 Last pap smear/pelvic exam: 2016 normal, s/p hysterectomy and R oophorectomy DEXA: 06/2019 fem T -2.7, alendronate sent in 06/2019 PFT 02/2016 [place] AB 02/2015 US thyroid 10/2018 R nodule annual monitoring x 5 years ~ 2022 CT chest 06/2018  Prior vaccinations: TD or Tdap: 2018  Influenza:  05/2019 Pneumococcal: 2015 Prevnar13: 2016 Shingles/Zostavax: reports had at [facility], she will obtain records  Names of Other Physician/Practitioners you currently use: 1. [facility] here for primary care 2. [facility], eye doctor, last visit 2020, had R cataract by Dr. Pandora Leiter in 2020 3. [facility], dentist, last visit 2018, needs to schedule, needs teeth pulled  Patient Care Team: Unk Pinto, MD as PCP - General (Internal Medicine) Aquilla Hacker, MD as Referring Physician (Psychiatry) Jannette Spanner, MD as Referring Physician (Dermatology)  Surgical History:  She has a past surgical history that includes Abdominal hysterectomy; Tubal ligation; Foot surgery (Left, 2008); Colonoscopy; Upper gastrointestinal endoscopy; Oophorectomy (Right); Carpometacarpel [facility]) suspension plasty (Right, 12/08/2013); I & D extremity (Right, 01/20/2017); Hand surgery (Right); and Cataract extraction (Right, 2020). Family History:  Herfamily history includes Asthma in her mother; Dementia in her brother; Heart attack in her brother; Heart disease in her brother and father; Pancreatic cancer in her brother. Social History:  She reports that she is a non-smoker but has been exposed to tobacco smoke. She has never used smokeless tobacco. She reports that she does not drink alcohol or use drugs.  Review of Systems: Review of Systems  Constitutional: Negative for malaise/fatigue and weight loss.  HENT: Negative for hearing loss and tinnitus.   Eyes: Negative for blurred vision and double vision.  Respiratory: Negative for cough, sputum production, shortness of breath and wheezing.   Cardiovascular: Negative for chest pain, palpitations, orthopnea, claudication, leg swelling and PND.  Gastrointestinal: Negative for abdominal pain, blood in stool, constipation, diarrhea, heartburn, melena, nausea and vomiting.  Genitourinary: Negative.   Musculoskeletal: Positive  for back pain (left lumbar, saw ortho, recommended PT). Negative for falls, joint pain and myalgias.  Skin: Negative for rash.  Neurological: Negative for dizziness, tingling, sensory change, weakness and headaches.  Endo/Heme/Allergies: Negative for polydipsia.  Psychiatric/Behavioral: Negative.  Negative for depression, memory loss, substance abuse and suicidal ideas. The patient is not nervous/anxious and does not have insomnia.   All other systems reviewed and are negative.   Physical Exam: Estimated body mass index is 24.27 kg/m as calculated from the following:   Height as of this encounter: 5\' 3"  (1.6 m).   Weight as of this encounter: 137 lb (62.1 kg). BP 124/72   Pulse 75   Temp (!) 97.5 F (36.4 C)   Ht 5\' 3"  (1.6 m)   Wt 137 lb (62.1 kg)   SpO2 98%   BMI 24.27 kg/m  General Appearance: Well nourished, in no apparent distress.  Eyes: PERRLA, EOMs, conjunctiva no swelling or erythema, normal fundi and vessels.  Sinuses: No Frontal/maxillary tenderness  ENT/Mouth: Ext aud canals clear, normal light reflex with TMs without erythema, bulging. Good dentition. No erythema, swelling, or exudate on post pharynx. Tonsils not swollen or erythematous. Hearing normal.  Neck: Supple, thyroid normal. No bruits  Respiratory: Respiratory effort normal, BS equal bilaterally without rales,  rhonchi, wheezing or stridor.  Cardio: RRR without murmurs, rubs or gallops. Brisk peripheral pulses without edema.  Chest: symmetric, with normal excursions and percussion.  Breasts: Symmetric, without lumps, nipple discharge, retractions.  Abdomen: Soft, nontender, no guarding, rebound, hernias, masses, or organomegaly.  Lymphatics: Non tender without lymphadenopathy.  Genitourinary: No concerns, s/p hysterectomy, defer Musculoskeletal: Full ROM all peripheral extremities,5/5 strength, and normal gait.  Skin: Warm, dry without rashes, lesions, ecchymosis. Neuro: Cranial nerves intact, reflexes equal  bilaterally. Normal muscle tone, no cerebellar symptoms. Sensation intact.  Psych: Awake and oriented X 3, normal affect, Insight and Judgment appropriate.   EKG: WNL no ST changes.   Gorden Harms Mayline Dragon 10:28 AM [facility]

## 2019-11-06 ENCOUNTER — Other Ambulatory Visit: Payer: Self-pay

## 2019-11-06 ENCOUNTER — Encounter: Payer: Self-pay | Admitting: Adult Health

## 2019-11-06 ENCOUNTER — Ambulatory Visit (INDEPENDENT_AMBULATORY_CARE_PROVIDER_SITE_OTHER): Payer: PPO | Admitting: Adult Health

## 2019-11-06 VITALS — BP 124/72 | HR 75 | Temp 97.5°F | Ht 63.0 in | Wt 137.0 lb

## 2019-11-06 DIAGNOSIS — Z1329 Encounter for screening for other suspected endocrine disorder: Secondary | ICD-10-CM

## 2019-11-06 DIAGNOSIS — E559 Vitamin D deficiency, unspecified: Secondary | ICD-10-CM | POA: Diagnosis not present

## 2019-11-06 DIAGNOSIS — Z0000 Encounter for general adult medical examination without abnormal findings: Secondary | ICD-10-CM | POA: Diagnosis not present

## 2019-11-06 DIAGNOSIS — D649 Anemia, unspecified: Secondary | ICD-10-CM | POA: Diagnosis not present

## 2019-11-06 DIAGNOSIS — Z131 Encounter for screening for diabetes mellitus: Secondary | ICD-10-CM

## 2019-11-06 DIAGNOSIS — Q858 Other phakomatoses, not elsewhere classified: Secondary | ICD-10-CM

## 2019-11-06 DIAGNOSIS — E041 Nontoxic single thyroid nodule: Secondary | ICD-10-CM | POA: Diagnosis not present

## 2019-11-06 DIAGNOSIS — N1831 Chronic kidney disease, stage 3a: Secondary | ICD-10-CM

## 2019-11-06 DIAGNOSIS — K219 Gastro-esophageal reflux disease without esophagitis: Secondary | ICD-10-CM

## 2019-11-06 DIAGNOSIS — E782 Mixed hyperlipidemia: Secondary | ICD-10-CM | POA: Diagnosis not present

## 2019-11-06 DIAGNOSIS — Z79899 Other long term (current) drug therapy: Secondary | ICD-10-CM

## 2019-11-06 DIAGNOSIS — I10 Essential (primary) hypertension: Secondary | ICD-10-CM

## 2019-11-06 DIAGNOSIS — Z1159 Encounter for screening for other viral diseases: Secondary | ICD-10-CM

## 2019-11-06 DIAGNOSIS — J310 Chronic rhinitis: Secondary | ICD-10-CM

## 2019-11-06 DIAGNOSIS — L814 Other melanin hyperpigmentation: Secondary | ICD-10-CM

## 2019-11-06 DIAGNOSIS — Z0001 Encounter for general adult medical examination with abnormal findings: Secondary | ICD-10-CM

## 2019-11-06 DIAGNOSIS — Q8789 Other specified congenital malformation syndromes, not elsewhere classified: Secondary | ICD-10-CM

## 2019-11-06 DIAGNOSIS — R053 Chronic cough: Secondary | ICD-10-CM

## 2019-11-06 DIAGNOSIS — Z8249 Family history of ischemic heart disease and other diseases of the circulatory system: Secondary | ICD-10-CM

## 2019-11-06 DIAGNOSIS — M810 Age-related osteoporosis without current pathological fracture: Secondary | ICD-10-CM

## 2019-11-06 DIAGNOSIS — R7309 Other abnormal glucose: Secondary | ICD-10-CM

## 2019-11-06 DIAGNOSIS — F411 Generalized anxiety disorder: Secondary | ICD-10-CM

## 2019-11-06 DIAGNOSIS — Z136 Encounter for screening for cardiovascular disorders: Secondary | ICD-10-CM | POA: Diagnosis not present

## 2019-11-06 DIAGNOSIS — R05 Cough: Secondary | ICD-10-CM

## 2019-11-06 DIAGNOSIS — I700 Atherosclerosis of aorta: Secondary | ICD-10-CM | POA: Diagnosis not present

## 2019-11-06 DIAGNOSIS — K449 Diaphragmatic hernia without obstruction or gangrene: Secondary | ICD-10-CM

## 2019-11-06 MED ORDER — ALENDRONATE SODIUM 70 MG PO TABS: 70.0000 mg | ORAL_TABLET | ORAL | 1 refills | Status: DC

## 2019-11-06 NOTE — Patient Instructions (Addendum)
Latoya [name] , Thank you for taking time to come for your Annual Wellness Visit. I appreciate your ongoing commitment to your health goals. Please review the following plan we discussed and let me know if I can assist you in the future.   These are the goals we discussed: Goals    . DIET - INCREASE WATER INTAKE     65-80+    . Exercise 150 min/wk Moderate Activity     Exercises to help prevent osteoporosis daily        This is a list of the screening recommended for you and due dates:  Health Maintenance  Topic Date Due  .  Hepatitis C: One time screening is recommended by Center for Disease Control  (CDC) for  adults born from [birth year] through 1966.   1951-07-11  . Mammogram  06/22/2021  . Colon Cancer Screening  12/13/2022  . Tetanus Vaccine  03/23/2027  . Flu Shot  Completed  . DEXA scan (bone density measurement)  Completed  . Pneumonia vaccines  Discontinued    Please get on a calcium supplement - 300 mg daily   Please bring by covid 19 vaccine proof  Please get shingles shot record       Osteoporosis  Osteoporosis is thinning and loss of density in your bones. Osteoporosis makes bones more brittle and fragile and more likely to break (fracture). Over time, osteoporosis can cause your bones to become so weak that they fracture after a minor fall. Bones in the hip, wrist, and spine are most likely to fracture due to osteoporosis. What are the causes? The exact cause of this condition is not known. What increases the risk? You may be at greater risk for osteoporosis if you:  Have a family history of the condition.  Have poor nutrition.  Use steroid medicines, such as prednisone.  Are female.  Are age 70 or older.  Smoke or have a history of smoking.  Are not physically active (are sedentary).  Are white (Caucasian) or of Asian descent.  Have a small body frame.  Take certain medicines, such as antiseizure medicines. What are the signs or symptoms? A  fracture might be the first sign of osteoporosis, especially if the fracture results from a fall or injury that usually would not cause a bone to break. Other signs and symptoms include:  Pain in the neck or low back.  Stooped posture.  Loss of height. How is this diagnosed? This condition may be diagnosed based on:  Your medical history.  A physical exam.  A bone mineral density test, also called a DXA or DEXA test (dual-energy X-ray absorptiometry test). This test uses X-rays to measure the amount of minerals in your bones. How is this treated? The goal of treatment is to strengthen your bones and lower your risk for a fracture. Treatment may involve:  Making lifestyle changes, such as: ? Including foods with more calcium and vitamin D in your diet. ? Doing weight-bearing and muscle-strengthening exercises. ? Stopping tobacco use. ? Limiting alcohol intake.  Taking medicine to slow the process of bone loss or to increase bone density.  Taking daily supplements of calcium and vitamin D.  Taking hormone replacement medicines, such as estrogen for women and testosterone for men.  Monitoring your levels of calcium and vitamin D. Follow these instructions at home:  Activity  Exercise as told by your health care provider. Ask your health care provider what exercises and activities are safe for you. You  should do: ? Exercises that make you work against gravity (weight-bearing exercises), such as tai chi, yoga, or walking. ? Exercises to strengthen muscles, such as lifting weights. Lifestyle  Limit alcohol intake to no more than 1 drink a day for nonpregnant women and 2 drinks a day for men. One drink equals 12 oz of beer, 5 oz of wine, or 1 oz of hard liquor.  Do not use any products that contain nicotine or tobacco, such as cigarettes and e-cigarettes. If you need help quitting, ask your health care provider. Preventing falls  Use devices to help you move around (mobility  aids) as needed, such as canes, walkers, scooters, or crutches.  Keep rooms well-lit and clutter-free.  Remove tripping hazards from walkways, including cords and throw rugs.  Install grab bars in bathrooms and safety rails on stairs.  Use rubber mats in the bathroom and other areas that are often wet or slippery.  Wear closed-toe shoes that fit well and support your feet. Wear shoes that have rubber soles or low heels.  Review your medicines with your health care provider. Some medicines can cause dizziness or changes in blood pressure, which can increase your risk of falling. General instructions  Include calcium and vitamin D in your diet. Calcium is important for bone health, and vitamin D helps your body to absorb calcium. Good sources of calcium and vitamin D include: ? Certain fatty fish, such as salmon and tuna. ? Products that have calcium and vitamin D added to them (fortified products), such as fortified cereals. ? Egg yolks. ? Cheese. ? Liver.  Take over-the-counter and prescription medicines only as told by your health care provider.  Keep all follow-up visits as told by your health care provider. This is important. Contact a health care provider if:  You have never been screened for osteoporosis and you are: ? A woman who is age 70 or older. ? A man who is age 70 or older. Get help right away if:  You fall or injure yourself. Summary  Osteoporosis is thinning and loss of density in your bones. This makes bones more brittle and fragile and more likely to break (fracture),even with minor falls.  The goal of treatment is to strengthen your bones and reduce your risk for a fracture.  Include calcium and vitamin D in your diet. Calcium is important for bone health, and vitamin D helps your body to absorb calcium.  Talk with your health care provider about screening for osteoporosis if you are a woman who is age 70 or older, or a man who is age 70 or older. This  information is not intended to replace advice given to you by your health care provider. Make sure you discuss any questions you have with your health care provider. Document Revised: 08/03/2017 Document Reviewed: 06/15/2017 Elsevier Patient Education  [place].     Alendronate tablets What is this medicine? ALENDRONATE (a LEN droe nate) slows calcium loss from bones. It helps to make normal healthy bone and to slow bone loss in people with Paget's disease and osteoporosis. It may be used in others at risk for bone loss. This medicine may be used for other purposes; ask your health care provider or pharmacist if you have questions. COMMON BRAND NAME(S): Fosamax What should I tell my health care provider before I take this medicine? They need to know if you have any of these conditions:  dental disease  esophagus, stomach, or intestine problems, like acid reflux  or GERD  kidney disease  low blood calcium  low vitamin D  problems sitting or standing 30 minutes  trouble swallowing  an unusual or allergic reaction to alendronate, other medicines, foods, dyes, or preservatives  pregnant or trying to get pregnant  breast-feeding How should I use this medicine? You must take this medicine exactly as directed or you will lower the amount of the medicine you absorb into your body or you may cause yourself harm. Take this medicine by mouth first thing in the morning, after you are up for the day. Do not eat or drink anything before you take your medicine. Swallow the tablet with a full glass (6 to 8 fluid ounces) of plain water. Do not take this medicine with any other drink. Do not chew or crush the tablet. After taking this medicine, do not eat breakfast, drink, or take any medicines or vitamins for at least 30 minutes. Sit or stand up for at least 30 minutes after you take this medicine; do not lie down. Do not take your medicine more often than directed. Talk to your  pediatrician regarding the use of this medicine in children. Special care may be needed. Overdosage: If you think you have taken too much of this medicine contact a poison control center or emergency room at once. NOTE: This medicine is only for you. Do not share this medicine with others. What if I miss a dose? If you miss a dose, do not take it later in the day. Continue your normal schedule starting the next morning. Do not take double or extra doses. What may interact with this medicine?  aluminum hydroxide  antacids  aspirin  calcium supplements  drugs for inflammation like ibuprofen, naproxen, and others  iron supplements  magnesium supplements  vitamins with minerals This list may not describe all possible interactions. Give your health care provider a list of all the medicines, herbs, non-prescription drugs, or dietary supplements you use. Also tell them if you smoke, drink alcohol, or use illegal drugs. Some items may interact with your medicine. What should I watch for while using this medicine? Visit your doctor or health care professional for regular checks ups. It may be some time before you see benefit from this medicine. Do not stop taking your medicine except on your doctor's advice. Your doctor or health care professional may order blood tests and other tests to see how you are doing. You should make sure you get enough calcium and vitamin D while you are taking this medicine, unless your doctor tells you not to. Discuss the foods you eat and the vitamins you take with your health care professional. Some people who take this medicine have severe bone, joint, and/or muscle pain. This medicine may also increase your risk for a broken thigh bone. Tell your doctor right away if you have pain in your upper leg or groin. Tell your doctor if you have any pain that does not go away or that gets worse. This medicine can make you more sensitive to the sun. If you get a rash while  taking this medicine, sunlight may cause the rash to get worse. Keep out of the sun. If you cannot avoid being in the sun, wear protective clothing and use sunscreen. Do not use sun lamps or tanning beds/booths. What side effects may I notice from receiving this medicine? Side effects that you should report to your doctor or health care professional as soon as possible:  allergic reactions like skin  rash, itching or hives, swelling of the face, lips, or tongue  black or tarry stools  bone, muscle or joint pain  changes in vision  chest pain  heartburn or stomach pain  jaw pain, especially after dental work  pain or trouble when swallowing  redness, blistering, peeling or loosening of the skin, including inside the mouth Side effects that usually do not require medical attention (report to your doctor or health care professional if they continue or are bothersome):  changes in taste  diarrhea or constipation  eye pain or itching  headache  nausea or vomiting  stomach gas or fullness This list may not describe all possible side effects. Call your doctor for medical advice about side effects. You may report side effects to FDA at 1-800-FDA-1088. Where should I keep my medicine? Keep out of the reach of children. Store at room temperature of 15 and 30 degrees C (59 and 86 degrees F). Throw away any unused medicine after the expiration date. NOTE: This sheet is a summary. It may not cover all possible information. If you have questions about this medicine, talk to your doctor, pharmacist, or health care provider.  2020 Elsevier/Gold Standard (2011-02-17 08:56:09)

## 2019-11-07 ENCOUNTER — Other Ambulatory Visit: Payer: Self-pay | Admitting: Adult Health

## 2019-11-07 LAB — URINALYSIS, ROUTINE W REFLEX MICROSCOPIC
Bacteria, UA: NONE SEEN /HPF
Bilirubin Urine: NEGATIVE
Glucose, UA: NEGATIVE
Hgb urine dipstick: NEGATIVE
Ketones, ur: NEGATIVE
Nitrite: NEGATIVE
RBC / HPF: NONE SEEN /HPF (ref 0–2)
Specific Gravity, Urine: 1.019 (ref 1.001–1.03)
Squamous Epithelial / HPF: NONE SEEN /HPF (ref <=5)
WBC, UA: NONE SEEN /HPF (ref 0–5)
pH: 7.5 (ref 5.0–8.0)

## 2019-11-07 LAB — CBC WITH DIFFERENTIAL/PLATELET
Absolute Monocytes: 572 cells/uL (ref 200–950)
Basophils Absolute: 39 cells/uL (ref 0–200)
Basophils Relative: 0.6 %
Eosinophils Absolute: 104 cells/uL (ref 15–500)
Eosinophils Relative: 1.6 %
HCT: 41.1 % (ref 35.0–45.0)
Hemoglobin: 13.6 g/dL (ref 11.7–15.5)
Lymphs Abs: 1359 cells/uL (ref 850–3900)
MCH: 30.3 pg (ref 27.0–33.0)
MCHC: 33.1 g/dL (ref 32.0–36.0)
MCV: 91.5 fL (ref 80.0–100.0)
MPV: 10 fL (ref 7.5–12.5)
Monocytes Relative: 8.8 %
Neutro Abs: 4427 cells/uL (ref 1500–7800)
Neutrophils Relative %: 68.1 %
Platelets: 224 10*3/uL (ref 140–400)
RBC: 4.49 10*6/uL (ref 3.80–5.10)
RDW: 13 % (ref 11.0–15.0)
Total Lymphocyte: 20.9 %
WBC: 6.5 10*3/uL (ref 3.8–10.8)

## 2019-11-07 LAB — COMPLETE METABOLIC PANEL WITH GFR
AG Ratio: 1.8 (calc) (ref 1.0–2.5)
ALT: 16 U/L (ref 6–29)
AST: 30 U/L (ref 10–35)
Albumin: 4 g/dL (ref 3.6–5.1)
Alkaline phosphatase (APISO): 78 U/L (ref 37–153)
BUN/Creatinine Ratio: 15 (calc) (ref 6–22)
BUN: 16 mg/dL (ref 7–25)
CO2: 28 mmol/L (ref 20–32)
Calcium: 9.2 mg/dL (ref 8.6–10.4)
Chloride: 109 mmol/L (ref 98–110)
Creat: 1.09 mg/dL — ABNORMAL HIGH (ref 0.50–0.99)
GFR, Est African American: 60 mL/min/{1.73_m2} (ref >=60)
GFR, Est Non African American: 52 mL/min/{1.73_m2} — ABNORMAL LOW (ref >=60)
Globulin: 2.2 g/dL (calc) (ref 1.9–3.7)
Glucose, Bld: 73 mg/dL (ref 65–99)
Potassium: 4.3 mmol/L (ref 3.5–5.3)
Sodium: 143 mmol/L (ref 135–146)
Total Bilirubin: 0.4 mg/dL (ref 0.2–1.2)
Total Protein: 6.2 g/dL (ref 6.1–8.1)

## 2019-11-07 LAB — MICROALBUMIN / CREATININE URINE RATIO
Creatinine, Urine: 228 mg/dL (ref 20–275)
Microalb Creat Ratio: 27 mcg/mg creat (ref <30)
Microalb, Ur: 6.2 mg/dL

## 2019-11-07 LAB — LIPID PANEL
Cholesterol: 175 mg/dL (ref <200)
HDL: 50 mg/dL (ref >=50)
LDL Cholesterol (Calc): 100 mg/dL (calc) — ABNORMAL HIGH
Non-HDL Cholesterol (Calc): 125 mg/dL (calc) (ref <130)
Total CHOL/HDL Ratio: 3.5 (calc) (ref <5.0)
Triglycerides: 148 mg/dL (ref <150)

## 2019-11-07 LAB — HEPATITIS C ANTIBODY
Hepatitis C Ab: NONREACTIVE
SIGNAL TO CUT-OFF: 0.01 (ref <1.00)

## 2019-11-07 LAB — VITAMIN D 25 HYDROXY (VIT D DEFICIENCY, FRACTURES): Vit D, 25-Hydroxy: 65 ng/mL (ref 30–100)

## 2019-11-07 LAB — VITAMIN B12: Vitamin B-12: 1064 pg/mL (ref 200–1100)

## 2019-11-07 LAB — TSH: TSH: 1.69 mIU/L (ref 0.40–4.50)

## 2019-11-07 LAB — HEMOGLOBIN A1C
Hgb A1c MFr Bld: 5.3 % of total Hgb (ref <5.7)
Mean Plasma Glucose: 105 (calc)
eAG (mmol/L): 5.8 (calc)

## 2019-11-07 LAB — MAGNESIUM: Magnesium: 2.7 mg/dL — ABNORMAL HIGH (ref 1.5–2.5)

## 2019-11-24 ENCOUNTER — Other Ambulatory Visit: Payer: Self-pay | Admitting: Adult Health

## 2019-11-24 DIAGNOSIS — M810 Age-related osteoporosis without current pathological fracture: Secondary | ICD-10-CM

## 2019-11-25 ENCOUNTER — Other Ambulatory Visit: Payer: Self-pay | Admitting: Internal Medicine

## 2019-11-25 ENCOUNTER — Other Ambulatory Visit: Payer: Self-pay | Admitting: Physician Assistant

## 2019-11-25 DIAGNOSIS — F419 Anxiety disorder, unspecified: Secondary | ICD-10-CM

## 2019-11-25 MED ORDER — PROPRANOLOL HCL 10 MG PO TABS: ORAL_TABLET | ORAL | 0 refills | Status: DC

## 2019-12-04 ENCOUNTER — Ambulatory Visit (INDEPENDENT_AMBULATORY_CARE_PROVIDER_SITE_OTHER): Payer: PPO | Admitting: *Deleted

## 2019-12-04 ENCOUNTER — Other Ambulatory Visit: Payer: Self-pay

## 2019-12-04 LAB — MAGNESIUM: Magnesium: 2.2 mg/dL (ref 1.5–2.5)

## 2019-12-04 NOTE — Progress Notes (Signed)
Patient is here for a NV to check a BMP and Magnesium level. Patient states she drinks a lot of water and states she has been using a powder to flavor it.  She will check if there is magnesium in the powder. She denied the use of laxatives and epsom salt baths.

## 2019-12-22 ENCOUNTER — Telehealth: Payer: Self-pay

## 2019-12-22 NOTE — Telephone Encounter (Signed)
-----   Message from [name], [place] sent at 12/22/2019 11:28 AM EDT ----- Regarding: RE: poss med reaction Contact: [phone] Stop for 1 month and see if this helps. If this does not help make follow up appointment.  Estill Bamberg ----- Message ----- From: [name], CMA Sent: 12/22/2019  10:36 AM EDT To: [name], PA-C Subject: poss med reaction                              ALENDRONATE:   Pt believes that this has caused her to have leg, hip pain.  Please advise.  Please & thank you

## 2019-12-22 NOTE — Telephone Encounter (Signed)
Pt has been informed & voiced understanding. Pt will call in 45mth with an update as requested.

## 2020-01-13 ENCOUNTER — Other Ambulatory Visit: Payer: Self-pay | Admitting: Adult Health

## 2020-01-26 ENCOUNTER — Ambulatory Visit
Admission: RE | Admit: 2020-01-26 | Discharge: 2020-01-26 | Disposition: A | Discharge status: Home / Self Care | Payer: PPO | Source: Ambulatory Visit | Attending: Adult Health | Admitting: Adult Health

## 2020-01-26 DIAGNOSIS — E041 Nontoxic single thyroid nodule: Secondary | ICD-10-CM

## 2020-01-29 ENCOUNTER — Encounter: Payer: Self-pay | Admitting: Adult Health Nurse Practitioner

## 2020-01-29 ENCOUNTER — Ambulatory Visit: Payer: PPO | Admitting: Adult Health

## 2020-01-29 ENCOUNTER — Ambulatory Visit (INDEPENDENT_AMBULATORY_CARE_PROVIDER_SITE_OTHER): Payer: PPO | Admitting: Adult Health Nurse Practitioner

## 2020-01-29 ENCOUNTER — Other Ambulatory Visit: Payer: Self-pay

## 2020-01-29 VITALS — BP 118/74 | HR 76 | Temp 97.6°F | Wt 138.0 lb

## 2020-01-29 DIAGNOSIS — R1012 Left upper quadrant pain: Secondary | ICD-10-CM

## 2020-01-29 DIAGNOSIS — R58 Hemorrhage, not elsewhere classified: Secondary | ICD-10-CM | POA: Diagnosis not present

## 2020-01-29 DIAGNOSIS — R300 Dysuria: Secondary | ICD-10-CM | POA: Diagnosis not present

## 2020-01-29 DIAGNOSIS — R161 Splenomegaly, not elsewhere classified: Secondary | ICD-10-CM

## 2020-01-29 NOTE — Patient Instructions (Signed)
Hand written:  OTC diclofenac gel apply to affected area TID prn  May use tylenol 1,000mg  TID for pain  Discussed hospital precautions with patient  We will call you with lab results in 1-3 days.

## 2020-01-29 NOTE — Progress Notes (Signed)
Assessment and Plan:  Latoya [name] was seen today for acute visit and stress.  Diagnoses and all orders for this visit:  Abdominal pain, LUQ Splenomegaly -     CBC with Differential/Platelet -     COMPLETE METABOLIC PANEL WITH GFR -     Epstein-Barr virus VCA antibody panel r/u mono Pending lab results Reported symptoms and physical, questionable msk? OTC topical diclofenac gel.  Take tylenol 1,000mg  TID. Discussed hospital precautions Consider imaging?  Dysuria -     Urinalysis w microscopic + reflex cultur Increase water intake  Ecchymosis Continue to monitor  Likely related to bASA Will check cbc    Further disposition pending results of labs. Discussed med's effects and SE's.   Over 30 minutes of face to face exam, counseling, chart review, and critical decision making was performed.   Future Appointments  Date Time Provider [place]  02/03/2020 10:40 AM [name], MD [facility] None  02/20/2020  9:30 AM [name], PA-C [facility] None  05/25/2020  9:30 AM Unk Pinto, MD [facility] None  11/08/2020 10:00 AM [name], NP [facility] None    ------------------------------------------------------------------------------------------------------------------   HPI 69 y.o.female presents for evaluation of fatigue and left sided abdominal pain.  She was last seen in our office on 11/06/2019. She repports that this has been going on over a couple weeks.  She reports she is always on the go and busy.  She is busy with house work, caring for her husband and also busy outside with yard work.  She reports it is a 7/10 pain when she rolls over in bed or attempts to get up out of bed.  Reports there is mild discomfort/ache when walking.  She also reports 9/10 pain when she takes a deep breath in her RUQ.  The quality is sharp in nature and then achy.  Her LBM was this morning.  Denies any nausea, vomiting, reflux, cramping, bloating or excessive burping. Denies  any lower extremity pain, numbness or tingling, hip pain, chest pain or shortness of breath. She reports she has not had anything like this in the past.   She has taken tylenol for the pain with mild relief.  She has also used topical biofreeze which she reports has not made a noticable difference. She also reports she has been fatigued.  This reported consistently at each OV.  She does not take time to reladx or do something for herself.  Caregiver strain?  She also reports she noticed she is now bruising easily.  She has an area of ecchymosis to her right forearm.  She noticed it in the shower today and not sure where it came from.  She does take bASA daily.  She reports some mild dysuria.  She has increased her water intake related to this.  She denies any hematura, frequency or urgency symptoms and denies any fever or chills.  She had and abdominal ultrasound on 11/12/2017 that was unremarkable.  She has history of appendectomy, hysterectomy and right oophorectomy and kidney stones.   Past Medical History:  Diagnosis Date  . Anxiety and depression   . Arthritis   . Colon polyps   . GERD (gastroesophageal reflux disease)   . Hemorrhoids   . Hiatal hernia   . History of kidney stones    passed  . Hyperlipemia   . Mixed hyperlipidemia 11/26/2013  . Panic disorder   . Passive smoke exposure 07/17/2018  . Renal insufficiency    stage 3 kidney disease per her PCP  .  Stomach ulcer   . Unspecified essential hypertension 11/26/2013     Allergies  Allergen Reactions  . Other     All pain medications: Unknown/ CODEINE  . Prednisone Other (See Comments)    DYSPHORIA  . Vioxx [Rofecoxib]     unknown  . Entex T [Pseudoephedrine-Guaifenesin] Palpitations  . Levaquin [Levofloxacin] Rash    Tolerated Avelox without adverse effect 11/08/15.  . Sulfa Antibiotics Rash  . Trovan [Alatrofloxacin] Rash    Current Outpatient Medications on File Prior to Visit  Medication Sig  . alendronate  (FOSAMAX) 70 MG tablet Take 1 tablet  1 x /week on an Empty Stomach for 30 minutes for Osteoporosis  . aspirin 81 MG tablet Take 81 mg by mouth at bedtime.   . cholecalciferol (VITAMIN D) 1000 units tablet Take 10,000 Units by mouth daily.  . clonazePAM (KLONOPIN) 1 MG tablet clonazepam 1 mg tablet  . diltiazem (CARDIZEM) 120 MG tablet TAKE 1 TABLET BY MOUTH TWICE DAILY FOR BLOOD PRESSURE  . famotidine (PEPCID) 20 MG tablet 20 mg daily. As needed.  . fexofenadine (ALLEGRA) 180 MG tablet Take 1 tablet (180 mg total) by mouth daily.  . fluticasone (FLONASE) 50 MCG/ACT nasal spray SHAKE LIQUID AND USE 2 SPRAYS IN EACH NOSTRIL AT BEDTIME  . furosemide (LASIX) 20 MG tablet Take 1 tablet (20 mg total) by mouth daily as needed for edema.  Earley Abide Compounding Base LIQD Take 5 mLs by mouth every 2 (two) hours as needed.  . potassium chloride SA (K-DUR) 20 MEQ tablet Take 1 tablet 2 x /day for Potassium  . pravastatin (PRAVACHOL) 40 MG tablet Take 1 tablet Daily for Cholesterol  . promethazine-dextromethorphan (PROMETHAZINE-DM) 6.25-15 MG/5ML syrup TAKE 5 ML BY MOUTH FOUR TIMES DAILY AS NEEDED FOR COUGH  . propranolol (INDERAL) 10 MG tablet Take 1 tablet 2 x /day for Anxiety  . sertraline (ZOLOFT) 100 MG tablet Take 1 tablet Daily for  Mood   No current facility-administered medications on file prior to visit.    ROS: all negative except above.   Physical Exam:  BP 118/74   Pulse 76   Temp 97.6 F (36.4 C)   Wt 138 lb (62.6 kg)   SpO2 98%   BMI 24.45 kg/m   General Appearance: Well nourished, in no apparent distress. Eyes: PERRLA, EOMs, conjunctiva no swelling or erythema Sinuses: No Frontal/maxillary tenderness ENT/Mouth: Ext aud canals clear, TMs without erythema, bulging. No erythema, swelling, or exudate on post pharynx.  Tonsils not swollen or erythematous. Hearing normal.  Neck: Supple, thyroid normal.  Respiratory: Respiratory effort normal, BS equal bilaterally without rales,  rhonchi, wheezing or stridor.  Cardio: RRR with no MRGs. Brisk peripheral pulses without edema.  Abdomen: Soft, + BS. no guarding, rebound, hernias, masses.  Splenomegaly noted with tenderness to deep palpation in LUQ.  Castell's sign positive. Lymphatics: Non tender without lymphadenopathy.  Musculoskeletal: Full ROM, 5/5 strength, normal gait.  Mild point tenderness noted to right illiac crest at flank.  No CVA tenderness.  Left Hip full ROM illicite zero discomfort with passive and active ROM/resistance.  Skin: Warm, dry without rashes, lesions, ecchymosis noted to right posterior forearm.  Neuro: Cranial nerves intact. Normal muscle tone, no cerebellar symptoms. Sensation intact.  Psych: Awake and oriented X 3, normal affect, Insight and Judgment appropriate.     Garnet Sierras, NP 3:22 PM [facility]

## 2020-02-01 LAB — CBC WITH DIFFERENTIAL/PLATELET
Absolute Monocytes: 637 cells/uL (ref 200–950)
Basophils Absolute: 52 cells/uL (ref 0–200)
Basophils Relative: 0.8 %
Eosinophils Absolute: 137 cells/uL (ref 15–500)
Eosinophils Relative: 2.1 %
HCT: 42.9 % (ref 35.0–45.0)
Hemoglobin: 13.9 g/dL (ref 11.7–15.5)
Lymphs Abs: 1638 cells/uL (ref 850–3900)
MCH: 29.6 pg (ref 27.0–33.0)
MCHC: 32.4 g/dL (ref 32.0–36.0)
MCV: 91.5 fL (ref 80.0–100.0)
MPV: 10 fL (ref 7.5–12.5)
Monocytes Relative: 9.8 %
Neutro Abs: 4037 cells/uL (ref 1500–7800)
Neutrophils Relative %: 62.1 %
Platelets: 222 10*3/uL (ref 140–400)
RBC: 4.69 10*6/uL (ref 3.80–5.10)
RDW: 12.5 % (ref 11.0–15.0)
Total Lymphocyte: 25.2 %
WBC: 6.5 10*3/uL (ref 3.8–10.8)

## 2020-02-01 LAB — COMPLETE METABOLIC PANEL WITH GFR
AG Ratio: 1.9 (calc) (ref 1.0–2.5)
ALT: 15 U/L (ref 6–29)
AST: 29 U/L (ref 10–35)
Albumin: 4.4 g/dL (ref 3.6–5.1)
Alkaline phosphatase (APISO): 91 U/L (ref 37–153)
BUN/Creatinine Ratio: 12 (calc) (ref 6–22)
BUN: 13 mg/dL (ref 7–25)
CO2: 27 mmol/L (ref 20–32)
Calcium: 9.4 mg/dL (ref 8.6–10.4)
Chloride: 106 mmol/L (ref 98–110)
Creat: 1.05 mg/dL — ABNORMAL HIGH (ref 0.50–0.99)
GFR, Est African American: 63 mL/min/{1.73_m2} (ref >=60)
GFR, Est Non African American: 54 mL/min/{1.73_m2} — ABNORMAL LOW (ref >=60)
Globulin: 2.3 g/dL (calc) (ref 1.9–3.7)
Glucose, Bld: 70 mg/dL (ref 65–99)
Potassium: 3.9 mmol/L (ref 3.5–5.3)
Sodium: 142 mmol/L (ref 135–146)
Total Bilirubin: 0.4 mg/dL (ref 0.2–1.2)
Total Protein: 6.7 g/dL (ref 6.1–8.1)

## 2020-02-01 LAB — URINALYSIS W MICROSCOPIC + REFLEX CULTURE
Bacteria, UA: NONE SEEN /HPF
Bilirubin Urine: NEGATIVE
Glucose, UA: NEGATIVE
Hgb urine dipstick: NEGATIVE
Hyaline Cast: NONE SEEN /LPF
Ketones, ur: NEGATIVE
Nitrites, Initial: NEGATIVE
Protein, ur: NEGATIVE
RBC / HPF: NONE SEEN /HPF (ref 0–2)
Specific Gravity, Urine: 1.015 (ref 1.001–1.03)
Squamous Epithelial / HPF: NONE SEEN /HPF (ref <=5)
WBC, UA: NONE SEEN /HPF (ref 0–5)
pH: 7 (ref 5.0–8.0)

## 2020-02-01 LAB — URINE CULTURE
MICRO NUMBER:: 10530388
SPECIMEN QUALITY:: ADEQUATE

## 2020-02-01 LAB — EPSTEIN-BARR VIRUS VCA ANTIBODY PANEL
EBV NA IgG: <18 U/mL
EBV VCA IgG: 79.1 U/mL — ABNORMAL HIGH
EBV VCA IgM: <36 U/mL

## 2020-02-01 LAB — CULTURE INDICATED

## 2020-02-02 ENCOUNTER — Other Ambulatory Visit: Payer: Self-pay | Admitting: Adult Health Nurse Practitioner

## 2020-02-02 DIAGNOSIS — N3000 Acute cystitis without hematuria: Secondary | ICD-10-CM

## 2020-02-02 MED ORDER — NITROFURANTOIN MONOHYD MACRO 100 MG PO CAPS: 100.0000 mg | ORAL_CAPSULE | Freq: Two times a day (BID) | ORAL | 0 refills | Status: DC

## 2020-02-03 ENCOUNTER — Ambulatory Visit: Payer: PPO | Admitting: Internal Medicine

## 2020-02-10 ENCOUNTER — Ambulatory Visit
Admission: RE | Admit: 2020-02-10 | Discharge: 2020-02-10 | Disposition: A | Discharge status: Home / Self Care | Payer: PPO | Source: Ambulatory Visit | Attending: Adult Health Nurse Practitioner | Admitting: Adult Health Nurse Practitioner

## 2020-02-10 ENCOUNTER — Ambulatory Visit (INDEPENDENT_AMBULATORY_CARE_PROVIDER_SITE_OTHER): Payer: PPO | Admitting: Adult Health Nurse Practitioner

## 2020-02-10 ENCOUNTER — Encounter: Payer: Self-pay | Admitting: Adult Health Nurse Practitioner

## 2020-02-10 ENCOUNTER — Other Ambulatory Visit: Payer: Self-pay

## 2020-02-10 VITALS — BP 120/80 | HR 72 | Temp 97.0°F | Wt 138.0 lb

## 2020-02-10 DIAGNOSIS — M25572 Pain in left ankle and joints of left foot: Secondary | ICD-10-CM

## 2020-02-10 DIAGNOSIS — E559 Vitamin D deficiency, unspecified: Secondary | ICD-10-CM

## 2020-02-10 DIAGNOSIS — Z0001 Encounter for general adult medical examination with abnormal findings: Secondary | ICD-10-CM

## 2020-02-10 DIAGNOSIS — R7309 Other abnormal glucose: Secondary | ICD-10-CM

## 2020-02-10 DIAGNOSIS — Q858 Other phakomatoses, not elsewhere classified: Secondary | ICD-10-CM

## 2020-02-10 DIAGNOSIS — I10 Essential (primary) hypertension: Secondary | ICD-10-CM

## 2020-02-10 DIAGNOSIS — L814 Other melanin hyperpigmentation: Secondary | ICD-10-CM

## 2020-02-10 DIAGNOSIS — R1012 Left upper quadrant pain: Secondary | ICD-10-CM

## 2020-02-10 DIAGNOSIS — R053 Chronic cough: Secondary | ICD-10-CM

## 2020-02-10 DIAGNOSIS — J310 Chronic rhinitis: Secondary | ICD-10-CM

## 2020-02-10 DIAGNOSIS — N1831 Chronic kidney disease, stage 3a: Secondary | ICD-10-CM

## 2020-02-10 DIAGNOSIS — K219 Gastro-esophageal reflux disease without esophagitis: Secondary | ICD-10-CM

## 2020-02-10 DIAGNOSIS — Z0000 Encounter for general adult medical examination without abnormal findings: Secondary | ICD-10-CM

## 2020-02-10 DIAGNOSIS — M810 Age-related osteoporosis without current pathological fracture: Secondary | ICD-10-CM

## 2020-02-10 DIAGNOSIS — Q8789 Other specified congenital malformation syndromes, not elsewhere classified: Secondary | ICD-10-CM

## 2020-02-10 DIAGNOSIS — R6889 Other general symptoms and signs: Secondary | ICD-10-CM | POA: Diagnosis not present

## 2020-02-10 DIAGNOSIS — R05 Cough: Secondary | ICD-10-CM

## 2020-02-10 DIAGNOSIS — R161 Splenomegaly, not elsewhere classified: Secondary | ICD-10-CM

## 2020-02-10 DIAGNOSIS — I700 Atherosclerosis of aorta: Secondary | ICD-10-CM

## 2020-02-10 DIAGNOSIS — K449 Diaphragmatic hernia without obstruction or gangrene: Secondary | ICD-10-CM

## 2020-02-10 DIAGNOSIS — F411 Generalized anxiety disorder: Secondary | ICD-10-CM

## 2020-02-10 DIAGNOSIS — E041 Nontoxic single thyroid nodule: Secondary | ICD-10-CM

## 2020-02-10 DIAGNOSIS — Z79899 Other long term (current) drug therapy: Secondary | ICD-10-CM

## 2020-02-10 DIAGNOSIS — E782 Mixed hyperlipidemia: Secondary | ICD-10-CM

## 2020-02-10 NOTE — Patient Instructions (Signed)
declined

## 2020-02-10 NOTE — Progress Notes (Addendum)
MEDICARE WELLNESS  Assessment and Plan:   Encounter for Medicare annual wellness exam Yearly  Essential hypertension - continue medications, DASH diet, exercise and monitor at home. Call if greater than 130/80.  -     CBC with Differential/Platelet -     CMP/GFR -     TSH  Mixed hyperlipidemia -continue medications, check lipids, decrease fatty foods, increase activity.  -     Lipid panel  Aortic atherosclerosis ([place]) Noted on CT 2016 Control blood pressure, cholesterol, glucose, increase exercise.   Multiple lentigines syndrome (HCC) Continue to monitor  Thyroid nodule -stable on follow up US 2018, recommended 5 years of annual thyroid US for follow up - US soft tissue (thyroid)   CKD (chronic kidney disease) stage 3, GFR 30-59 ml/min Increase fluids, avoid NSAIDS, monitor sugars, will monitor  Other abnormal glucose Discussed general issues about diabetes pathophysiology and management., Educational material distributed., Suggested low cholesterol diet., Encouraged aerobic exercise., Discussed foot care., Reminded to get yearly retinal exam.  Hiatal hernia Continue GERD medication, follow up GI as needed Avoid lying down after meals   Chronic rhinitis - Allegra OTC, increase H20, allergy hygiene explained.  Gastroesophageal reflux disease, esophagitis presence not specified/LPR Continue H2 blocker, Add diet discussed  Generalized anxiety disorder Increase zoloft to 150 mg daily, USE THE KLONOPIN IF NEEDED stress management techniques discussed, increase water, good sleep hygiene discussed, increase exercise, and increase veggies.   Vitamin D deficiency At goal at recent check; continue to recommend supplementation for goal of 70-100 Defer vitamin D level  Chronic cough syndrome Continue GERD meds, follow up Dr. Melvyn Novas as needed.   Osteoporosis DEXA UTD  Acute left ankle pain Hx osteoporosis Trauma, non-weight bearing  Abdominal pain  LUQ Splenomegaly Hx of kidney stones  Medication management -     Magnesium   Over 30 minutes of face to face exam, counseling, chart review and critical decision making was performed Future Appointments  Date Time Provider [place]  05/25/2020  9:30 AM Unk Pinto, MD [facility] None  11/08/2020 10:00 AM [name], NP [facility] None    Plan:   During the course of the visit the patient was educated and counseled about appropriate screening and preventive services including:    Pneumococcal vaccine   Prevnar 13  Influenza vaccine  Td vaccine  Screening electrocardiogram  Bone densitometry screening  Colorectal cancer screening  Diabetes screening  Glaucoma screening  Nutrition counseling   Advanced directives: requested    Subjective:  Latoya [name] is a 69 y.o. female who presents for medicare wellness 3 month follow up for htn, hyperlipidemia, glucose management, depression/anxiety, weight and vitamin D.    She reports 2-3 weeks ago she had the hood of the lawn mower slam down on her left ankle.  She reports it was sore but did not do any treatments for it.  She reports that is the past week it has been getting worse and to the point where she can not put any weight on it.  She has taken some tylenol for this a couple times and elevated it last week.  She has not been taking anything consistantly.  She rates the pain about a 4/10.  At the end of the day it is 8/10 and more swelling.  Last OV she has pos urine specimen and treated with ABX.  She has completed this.  She reports that her left back remains sore and catches and shooting pain to the front.  She  has GERD/LPR, had EGD 10/2017, on pepcid 40 mg daily per Dr. Carlean Purl; she also has dx of chronic cough syndrome per Dr. Melvyn Novas after extensive workup for persistent cough. She reports some epigastric discomfort, cough has resolved. Denies any other sx, fever/chills, N/V/D, reflux, burning,  hematochezia, melena.   she has a diagnosis of depression/anxiety and is currently on zoloft 100 mg daily, klonopin 0.5 mg PRN, reports symptoms are not well controlled on current regimen, under a lot of stress r/t her husbands heath and her daughter who is living with them. she currently reports using rarely, cuts in 1/4s and takes no more than 1-2 tabs per month.   BMI is Body mass index is 24.45 kg/m., she has been working on diet and exercise. Wt Readings from Last 3 Encounters:  02/10/20 138 lb (62.6 kg)  01/29/20 138 lb (62.6 kg)  12/04/19 139 lb 9.6 oz (63.3 kg)   Her blood pressure has been controlled at home, today their BP is BP: 120/80  She does workout, walking, yard work, house work. She denies chest pain, shortness of breath, dizziness.   She is on cholesterol medication and denies myalgias. Her cholesterol is not at goal. The cholesterol last visit was:   Lab Results  Component Value Date   CHOL 175 11/06/2019   HDL 50 11/06/2019   LDLCALC 100 (H) 11/06/2019   TRIG 148 11/06/2019   CHOLHDL 3.5 11/06/2019    She has been working on diet and exercise for glucose management, and denies paresthesia of the feet, polydipsia, polyuria and visual disturbances. Last A1C in the office was:  Lab Results  Component Value Date   HGBA1C 5.3 11/06/2019   Last GFR: Lab Results  Component Value Date   GFRNONAA 54 (L) 01/29/2020   Patient is on Vitamin D supplement for deficency.   Lab Results  Component Value Date   VD25OH 65 11/06/2019        Medication Review: Current Outpatient Medications on File Prior to Visit  Medication Sig  . alendronate (FOSAMAX) 70 MG tablet Take 1 tablet  1 x /week on an Empty Stomach for 30 minutes for Osteoporosis  . aspirin 81 MG tablet Take 81 mg by mouth at bedtime.   . cholecalciferol (VITAMIN D) 1000 units tablet Take 10,000 Units by mouth daily.  . clonazePAM (KLONOPIN) 1 MG tablet clonazepam 1 mg tablet  . diltiazem (CARDIZEM) 120 MG  tablet TAKE 1 TABLET BY MOUTH TWICE DAILY FOR BLOOD PRESSURE  . famotidine (PEPCID) 20 MG tablet 20 mg daily. As needed.  . fexofenadine (ALLEGRA) 180 MG tablet Take 1 tablet (180 mg total) by mouth daily.  . fluticasone (FLONASE) 50 MCG/ACT nasal spray SHAKE LIQUID AND USE 2 SPRAYS IN EACH NOSTRIL AT BEDTIME  . furosemide (LASIX) 20 MG tablet Take 1 tablet (20 mg total) by mouth daily as needed for edema.  Earley Abide Compounding Base LIQD Take 5 mLs by mouth every 2 (two) hours as needed.  . potassium chloride SA (K-DUR) 20 MEQ tablet Take 1 tablet 2 x /day for Potassium  . pravastatin (PRAVACHOL) 40 MG tablet Take 1 tablet Daily for Cholesterol  . promethazine-dextromethorphan (PROMETHAZINE-DM) 6.25-15 MG/5ML syrup TAKE 5 ML BY MOUTH FOUR TIMES DAILY AS NEEDED FOR COUGH  . propranolol (INDERAL) 10 MG tablet Take 1 tablet 2 x /day for Anxiety  . sertraline (ZOLOFT) 100 MG tablet Take 1 tablet Daily for  Mood   No current facility-administered medications on file prior to visit.  Allergies  Allergen Reactions  . Other     All pain medications: Unknown/ CODEINE  . Prednisone Other (See Comments)    DYSPHORIA  . Vioxx [Rofecoxib]     unknown  . Entex T [Pseudoephedrine-Guaifenesin] Palpitations  . Levaquin [Levofloxacin] Rash    Tolerated Avelox without adverse effect 11/08/15.  . Sulfa Antibiotics Rash  . Trovan [Alatrofloxacin] Rash    Current Problems (verified) Patient Active Problem List   Diagnosis Date Noted  . Osteoporosis 06/23/2019  . FHx: heart disease 11/27/2017  . Multiple lentigines syndrome ([place]) 06/20/2017  . Laryngopharyngeal reflux (LPR) 10/12/2016  . Chronic rhinitis 10/09/2016  . Hiatal hernia 07/14/2016  . Aortic atherosclerosis ([place]) 06/30/2016  . CKD (chronic kidney disease) stage 3, GFR 30-59 ml/min 06/30/2016  . Thyroid nodule 12/24/2015  . Abnormal glucose 05/01/2014  . Vitamin D deficiency 05/01/2014  . Medication management 05/01/2014  .  Essential hypertension 11/26/2013  . Hyperlipidemia, mixed 11/26/2013  . Chronic cough 09/19/2013  . Generalized anxiety disorder 07/11/2013  . Esophageal reflux 11/29/2012    Screening Tests Immunization History  Administered Date(s) Administered  . Influenza Split 06/04/2014, 06/05/2015  . Influenza, High Dose Seasonal PF 05/30/2017, 05/22/2018, 05/21/2019  . Influenza-Unspecified 06/06/2013, 06/13/2016  . Pneumococcal Conjugate-13 06/05/2015  . Pneumococcal Polysaccharide-23 04/06/2014  . Pneumococcal-Unspecified 09/04/1996, 09/04/2016  . Td 09/04/2005  . Tdap 03/22/2017   Preventative care: Last colonoscopy: 12/2012 Carlean Purl, 10 year follow up EGD 10/2017 Last mammogram: 11/2017 Last pap smear/pelvic exam: 2016 normal  DEXA: 06/2019 PFT 02/2016 [place] AB 02/2015 US thyroid 01/2020, repeat 1 year  CT chest 12/24/2015  Prior vaccinations: TD or Tdap: 2018  Influenza: 2019 Pneumococcal: 2015 Prevnar13: 2016 Shingles/Zostavax: declines due to cost  Names of Other Physician/Practitioners you currently use: 1. [facility] here for primary care 2. [facility], eye doctor, last visit 2021 3. Ladell Pier, dentist, last visit 2020, Due for 2021 Patient Care Team: Unk Pinto, MD as PCP - General (Internal Medicine) Aquilla Hacker, MD as Referring Physician (Psychiatry) Jannette Spanner, MD as Referring Physician (Dermatology)  SURGICAL HISTORY She  has a past surgical history that includes Abdominal hysterectomy; Tubal ligation; Foot surgery (Left, 2008); Colonoscopy; Upper gastrointestinal endoscopy; Oophorectomy (Right); Carpometacarpel [facility]) suspension plasty (Right, 12/08/2013); I & D extremity (Right, 01/20/2017); Hand surgery (Right); and Cataract extraction (Right, 2020). FAMILY HISTORY Her family history includes Asthma in her mother; CVA in her brother; Dementia in her brother; Heart Problems in her maternal grandfather; Heart  attack in her brother; Heart disease in her brother and father; Other in her brother; Pancreatic cancer in her brother. SOCIAL HISTORY She  reports that she is a non-smoker but has been exposed to tobacco smoke. She has never used smokeless tobacco. She reports that she does not drink alcohol or use drugs.  MEDICARE WELLNESS OBJECTIVES: Physical activity: Current Exercise Habits: Home exercise routine, Type of exercise: Other - see comments;walking(WOrk around the home and in the yard), Time (Minutes): 30, Frequency (Times/Week): 6, Weekly Exercise (Minutes/Week): 180, Intensity: Moderate, Exercise limited by: cardiac condition(s) Cardiac risk factors: Cardiac Risk Factors include: advanced age (>54men, >106 women);family history of premature cardiovascular disease;hypertension Depression/mood screen:   Depression screen [facility] 2/9 02/10/2020  Decreased Interest 0  Down, Depressed, Hopeless 0  PHQ - 2 Score 0  Altered sleeping -  Tired, decreased energy -  Change in appetite -  Feeling bad or failure about yourself  -  Trouble concentrating -  Moving slowly or fidgety/restless -  Suicidal thoughts -  PHQ-9 Score -  Difficult doing work/chores -  Some recent data might be hidden    ADLs:  In your present state of health, do you have any difficulty performing the following activities: 02/10/2020  Hearing? N  Vision? N  Difficulty concentrating or making decisions? N  Walking or climbing stairs? N  Dressing or bathing? N  Doing errands, shopping? N  Preparing Food and eating ? N  Using the Toilet? N  In the past six months, have you accidently leaked urine? N  Do you have problems with loss of bowel control? N  Managing your Medications? N  Managing your Finances? N  Housekeeping or managing your Housekeeping? N  Some recent data might be hidden     Cognitive Testing  Alert? Yes  Normal Appearance?Yes  Oriented to person? Yes  Place? Yes   Time? Yes  Recall of three objects?   Yes  Can perform simple calculations? Yes  Displays appropriate judgment?Yes  Can read the correct time from a watch face?Yes  EOL planning: Does Patient Have a Medical Advance Directive?: Yes Type of Advance Directive: Healthcare Power of Attorney, Living will   Review of Systems  Constitutional: Negative.  Negative for chills and fever.  HENT: Negative.   Eyes: Negative.   Respiratory: Negative for cough, hemoptysis, sputum production, shortness of breath and wheezing.   Cardiovascular: Negative for chest pain, palpitations, orthopnea, claudication, leg swelling and PND.  Gastrointestinal: Positive for abdominal pain (generalized). Negative for blood in stool, constipation, diarrhea, heartburn, melena, nausea and vomiting.  Genitourinary: Negative.   Musculoskeletal: Negative.   Skin: Negative.   Neurological: Negative for dizziness, tingling, tremors, sensory change, speech change, focal weakness, seizures, loss of consciousness and headaches.  Endo/Heme/Allergies: Negative.   Psychiatric/Behavioral: Negative for depression, substance abuse and suicidal ideas. The patient is nervous/anxious. The patient does not have insomnia.     Objective:     Today's Vitals   02/10/20 1535  BP: 120/80  Pulse: 72  Temp: (!) 97 F (36.1 C)  Weight: 138 lb (62.6 kg)  PainSc: 10-Worst pain ever   Body mass index is 24.45 kg/m.  General appearance: alert, no distress, WD/WN, female HEENT: normocephalic, sclerae anicteric, TMs pearly, nares patent, no discharge or erythema, pharynx normal Oral cavity: MMM, no lesions Neck: supple, no lymphadenopathy, no thyromegaly, no masses Heart: RRR, normal S1, S2, no murmurs Lungs: CTA bilaterally, no wheezes, rhonchi, or rales Abdomen: +bs, soft, LUQ abd tenderness, non distended, no masses, no hepatomegaly,  Splenomegaly noted.  No CVA tenderness  Left flank pain with deep palpation. Musculoskeletal: nontender, no swelling, no obvious deformity.  Antalgic gait, left ankle edema with ecchymosis and erythema noted to medial and lateral malleolus. Extremities: no edema, no cyanosis, no clubbing Pulses: 2+ symmetric, upper and lower extremities, normal cap refill Neurological: alert, oriented x 3, CN2-12 intact, strength normal upper extremities and lower extremities, sensation normal throughout, DTRs 2+  no cerebellar signs. Psychiatric: anxious affect, behavior normal, pleasant     Foot/Ankle Musculoskeletal Exam  Gait    Antalgic: left  Inspection    Inspection - Left      Left foot/ankle inspection is normal.       Erythema: moderate       Effusion: none       Edema: mild       Ecchymosis: medium       Deformity: none       Alignment: normal    Palpation  Palpation - Left       Increased warmth: mild       Masses: none       Crepitus: none       Tenderness: present         Sinus tarsi: severe         ATFL: mild         CFL: mild         Anterior medial joint line: moderate    Range of Motion    Range of Motion - Left      Active Dorsiflexion: 15     Passive Dorsiflexion: 15     Active Plantar Flexion: 45     Passive Plantar Flexion: 45     Active Inversion: 20     Passive Inversion: 20     Active Eversion: 10     Passive Eversion: 10  Neurovascular    Neurovascular - Left      Dorsalis pedis: 3+     Posterior tibial: 3+     Capillary refill: brisk     Clonus: normal       Medicare Attestation I have personally reviewed: The patient's medical and social history Their use of alcohol, tobacco or illicit drugs Their current medications and supplements The patient's functional ability including ADLs,fall risks, home safety risks, cognitive, and hearing and visual impairment Diet and physical activities Evidence for depression or mood disorders  The patient's weight, height, BMI, and visual acuity have been recorded in the chart.  I have made referrals, counseling, and provided education  to the patient based on review of the above and I have provided the patient with a written personalized care plan for preventive services.     Garnet Sierras, NP [facility] 02/10/2020  3:59 PM

## 2020-02-13 DIAGNOSIS — Z1211 Encounter for screening for malignant neoplasm of colon: Secondary | ICD-10-CM

## 2020-02-13 DIAGNOSIS — Z1212 Encounter for screening for malignant neoplasm of rectum: Secondary | ICD-10-CM

## 2020-02-16 ENCOUNTER — Other Ambulatory Visit: Payer: Self-pay | Admitting: Adult Health Nurse Practitioner

## 2020-02-16 DIAGNOSIS — R161 Splenomegaly, not elsewhere classified: Secondary | ICD-10-CM

## 2020-02-16 DIAGNOSIS — R109 Unspecified abdominal pain: Secondary | ICD-10-CM

## 2020-02-16 DIAGNOSIS — R1012 Left upper quadrant pain: Secondary | ICD-10-CM

## 2020-02-18 ENCOUNTER — Ambulatory Visit
Admission: RE | Admit: 2020-02-18 | Discharge: 2020-02-18 | Disposition: A | Discharge status: Home / Self Care | Payer: PPO | Source: Ambulatory Visit | Attending: Adult Health Nurse Practitioner | Admitting: Adult Health Nurse Practitioner

## 2020-02-18 ENCOUNTER — Other Ambulatory Visit: Payer: PPO

## 2020-02-18 DIAGNOSIS — R109 Unspecified abdominal pain: Secondary | ICD-10-CM

## 2020-02-18 DIAGNOSIS — R1012 Left upper quadrant pain: Secondary | ICD-10-CM

## 2020-02-18 DIAGNOSIS — R161 Splenomegaly, not elsewhere classified: Secondary | ICD-10-CM

## 2020-02-20 ENCOUNTER — Ambulatory Visit: Payer: PPO | Admitting: Adult Health

## 2020-02-20 ENCOUNTER — Ambulatory Visit: Payer: PPO | Admitting: Physician Assistant

## 2020-02-23 ENCOUNTER — Other Ambulatory Visit: Payer: Self-pay | Admitting: Internal Medicine

## 2020-02-23 ENCOUNTER — Other Ambulatory Visit: Payer: Self-pay | Admitting: Adult Health

## 2020-02-23 DIAGNOSIS — F419 Anxiety disorder, unspecified: Secondary | ICD-10-CM

## 2020-02-23 DIAGNOSIS — I10 Essential (primary) hypertension: Secondary | ICD-10-CM

## 2020-02-23 MED ORDER — DILTIAZEM HCL 120 MG PO TABS: ORAL_TABLET | ORAL | 1 refills | Status: DC

## 2020-02-23 MED ORDER — PROPRANOLOL HCL 10 MG PO TABS: ORAL_TABLET | ORAL | 0 refills | Status: DC

## 2020-04-20 ENCOUNTER — Other Ambulatory Visit: Payer: Self-pay

## 2020-04-20 ENCOUNTER — Ambulatory Visit (INDEPENDENT_AMBULATORY_CARE_PROVIDER_SITE_OTHER): Payer: PPO | Admitting: Adult Health Nurse Practitioner

## 2020-04-20 ENCOUNTER — Encounter: Payer: Self-pay | Admitting: Adult Health Nurse Practitioner

## 2020-04-20 VITALS — BP 142/86 | HR 71 | Temp 97.3°F | Wt 138.8 lb

## 2020-04-20 DIAGNOSIS — H6122 Impacted cerumen, left ear: Secondary | ICD-10-CM | POA: Diagnosis not present

## 2020-04-20 DIAGNOSIS — H9203 Otalgia, bilateral: Secondary | ICD-10-CM

## 2020-04-20 DIAGNOSIS — H65111 Acute and subacute allergic otitis media (mucoid) (sanguinous) (serous), right ear: Secondary | ICD-10-CM

## 2020-04-20 DIAGNOSIS — J301 Allergic rhinitis due to pollen: Secondary | ICD-10-CM | POA: Diagnosis not present

## 2020-04-20 MED ORDER — AZITHROMYCIN 250 MG PO TABS: ORAL_TABLET | ORAL | 1 refills | Status: AC

## 2020-04-20 NOTE — Patient Instructions (Addendum)
   Try taking Claritin daily to help dry up the drainage in your throat.  IF this does not work, change to Zyrtec (Cetiracine) nightly.   We will send in an antibiotic for your right ear.  Azithromycin take two tablets today and one a day until complete.  Drink plenty of fluids and be sure you are taking time to rest!  We flushed your right ear today.  Tonight put a few drops of mineral oil or olive oil in the right ear before bed for the next three nights.  Put a cotton ball on outside of your ear to prevent this from draining onto your pillow.  This will help coat your ear canal.

## 2020-04-20 NOTE — Progress Notes (Signed)
Assessment and Plan:  Kala was seen today for uri and otalgia.  Diagnoses and all orders for this visit:  Non-recurrent acute allergic otitis media of right ear -     azithromycin (ZITHROMAX) 250 MG tablet; Take 2 tablets (500 mg) on  Day 1,  followed by 1 tablet (250 mg) once daily on Days 2 through 5.  Otalgia of both ears Impacted cerumen of left ear -     Ear Lavage 3-4 drops of mineral or olive oil in ear at night, cotton ball to help sooth ear canal  Non-seasonal allergic rhinitis due to pollen Take OTC zyrtec every night Increase water intake   Contact office with any new or worsening symptoms     Further disposition pending results of labs. Discussed med's effects and SE's.   Over 30 minutes of exam, counseling, chart review, and critical decision making was performed.   Future Appointments  Date Time Provider [place]  05/25/2020  9:30 AM Unk Pinto, MD [facility] None  11/08/2020 10:00 AM [name], NP [facility] None    ------------------------------------------------------------------------------------------------------------------   HPI 69 y.o.female presents for evaluation of chest congestion that began four days ago.  She reports it started with hoarsness.  She has been doing throat clearing but has not been productive.  She also has a headache that is dull.  She is having nasal drainage in the back of her throat.  She is having some tenderness to her right ear.   She has not been taking any medications for this.  She did try some tylenol 500mg  before bed and this helped her to sleep.  Denies any fever, chills, fatigue, vision change, dizziness, sore throat, chest pain, shortness of breath.    Past Medical History:  Diagnosis Date  . Anxiety and depression   . Arthritis   . Colon polyps   . GERD (gastroesophageal reflux disease)   . Hemorrhoids   . Hiatal hernia   . History of kidney stones    passed  . Hyperlipemia   . Mixed  hyperlipidemia 11/26/2013  . Panic disorder   . Passive smoke exposure 07/17/2018  . Renal insufficiency    stage 3 kidney disease per her PCP  . Stomach ulcer   . Unspecified essential hypertension 11/26/2013     Allergies  Allergen Reactions  . Other     All pain medications: Unknown/ CODEINE  . Prednisone Other (See Comments)    DYSPHORIA  . Vioxx [Rofecoxib]     unknown  . Entex T [Pseudoephedrine-Guaifenesin] Palpitations  . Levaquin [Levofloxacin] Rash    Tolerated Avelox without adverse effect 11/08/15.  . Sulfa Antibiotics Rash  . Trovan [Alatrofloxacin] Rash    Current Outpatient Medications on File Prior to Visit  Medication Sig  . alendronate (FOSAMAX) 70 MG tablet Take 1 tablet  1 x /week on an Empty Stomach for 30 minutes for Osteoporosis  . aspirin 81 MG tablet Take 81 mg by mouth at bedtime.   . cholecalciferol (VITAMIN D) 1000 units tablet Take 10,000 Units by mouth daily.  . clonazePAM (KLONOPIN) 1 MG tablet clonazepam 1 mg tablet  . diltiazem (CARDIZEM) 120 MG tablet Take 1 tablet 2 x /day for BP  . famotidine (PEPCID) 20 MG tablet 20 mg daily. As needed.  . fexofenadine (ALLEGRA) 180 MG tablet Take 1 tablet (180 mg total) by mouth daily.  . fluticasone (FLONASE) 50 MCG/ACT nasal spray SHAKE LIQUID AND USE 2 SPRAYS IN EACH NOSTRIL AT BEDTIME  . furosemide (  LASIX) 20 MG tablet Take 1 tablet (20 mg total) by mouth daily as needed for edema.  Earley Abide Compounding Base LIQD Take 5 mLs by mouth every 2 (two) hours as needed.  . potassium chloride SA (K-DUR) 20 MEQ tablet Take 1 tablet 2 x /day for Potassium  . pravastatin (PRAVACHOL) 40 MG tablet Take 1 tablet Daily for Cholesterol  . promethazine-dextromethorphan (PROMETHAZINE-DM) 6.25-15 MG/5ML syrup TAKE 5 ML BY MOUTH FOUR TIMES DAILY AS NEEDED FOR COUGH  . propranolol (INDERAL) 10 MG tablet Take 1 tablet 2 x /day for Anxiety  . sertraline (ZOLOFT) 100 MG tablet Take 1 tablet Daily for  Mood   No current  facility-administered medications on file prior to visit.    ROS: all negative except above.   Physical Exam:  BP (!) 142/86   Pulse 71   Temp (!) 97.3 F (36.3 C)   Wt 138 lb 12.8 oz (63 kg)   SpO2 98%   BMI 24.59 kg/m   General Appearance: Well nourished, in no apparent distress. Eyes: PERRLA, EOMs, conjunctiva no swelling or erythema Sinuses: Frontal/maxillary tenderness ENT/Mouth: Ext aud canals clear on right, Left cerumen impaction. Right TM buldging white/yellow serous noted behind TM.Marland Kitchen No erythema, swelling, or exudate on post pharynx.  Tonsils not swollen or erythematous. Hearing normal.  Neck: Supple, thyroid normal.  Respiratory: Respiratory effort normal, BS equal bilaterally without rales, wheezing or stridor. Rhonchi noted on right, some clearing after cough. Cardio: RRR with no MRGs. Brisk peripheral pulses without edema.  Abdomen: Soft, + BS.  Non tender, no guarding, rebound, hernias, masses. Lymphatics: Non tender without lymphadenopathy.  Musculoskeletal: Full ROM, 5/5 strength, normal gait.  Skin: Warm, dry without rashes, lesions, ecchymosis.  Neuro: Cranial nerves intact. Normal muscle tone, no cerebellar symptoms. Sensation intact.  Psych: Awake and oriented X 3, normal affect, Insight and Judgment appropriate.    Ear lavage:  Completed in office, patient tolerated well.  Dark brown cerumen flushed out of canal.  Canal now clear.  TM intact.  Serous noted behind TM, mild buldging.  No erythema.   Garnet Sierras, NP 11:45 AM [facility]

## 2020-05-02 IMAGING — CR DG CHEST 2V
2 series · 2 of 2 positions shown · non-contrast
Comparison: 09/12/2017

CLINICAL DATA: Left lower rib pain x2 weeks

EXAM:
CHEST - 2 VIEW

[chest pa]
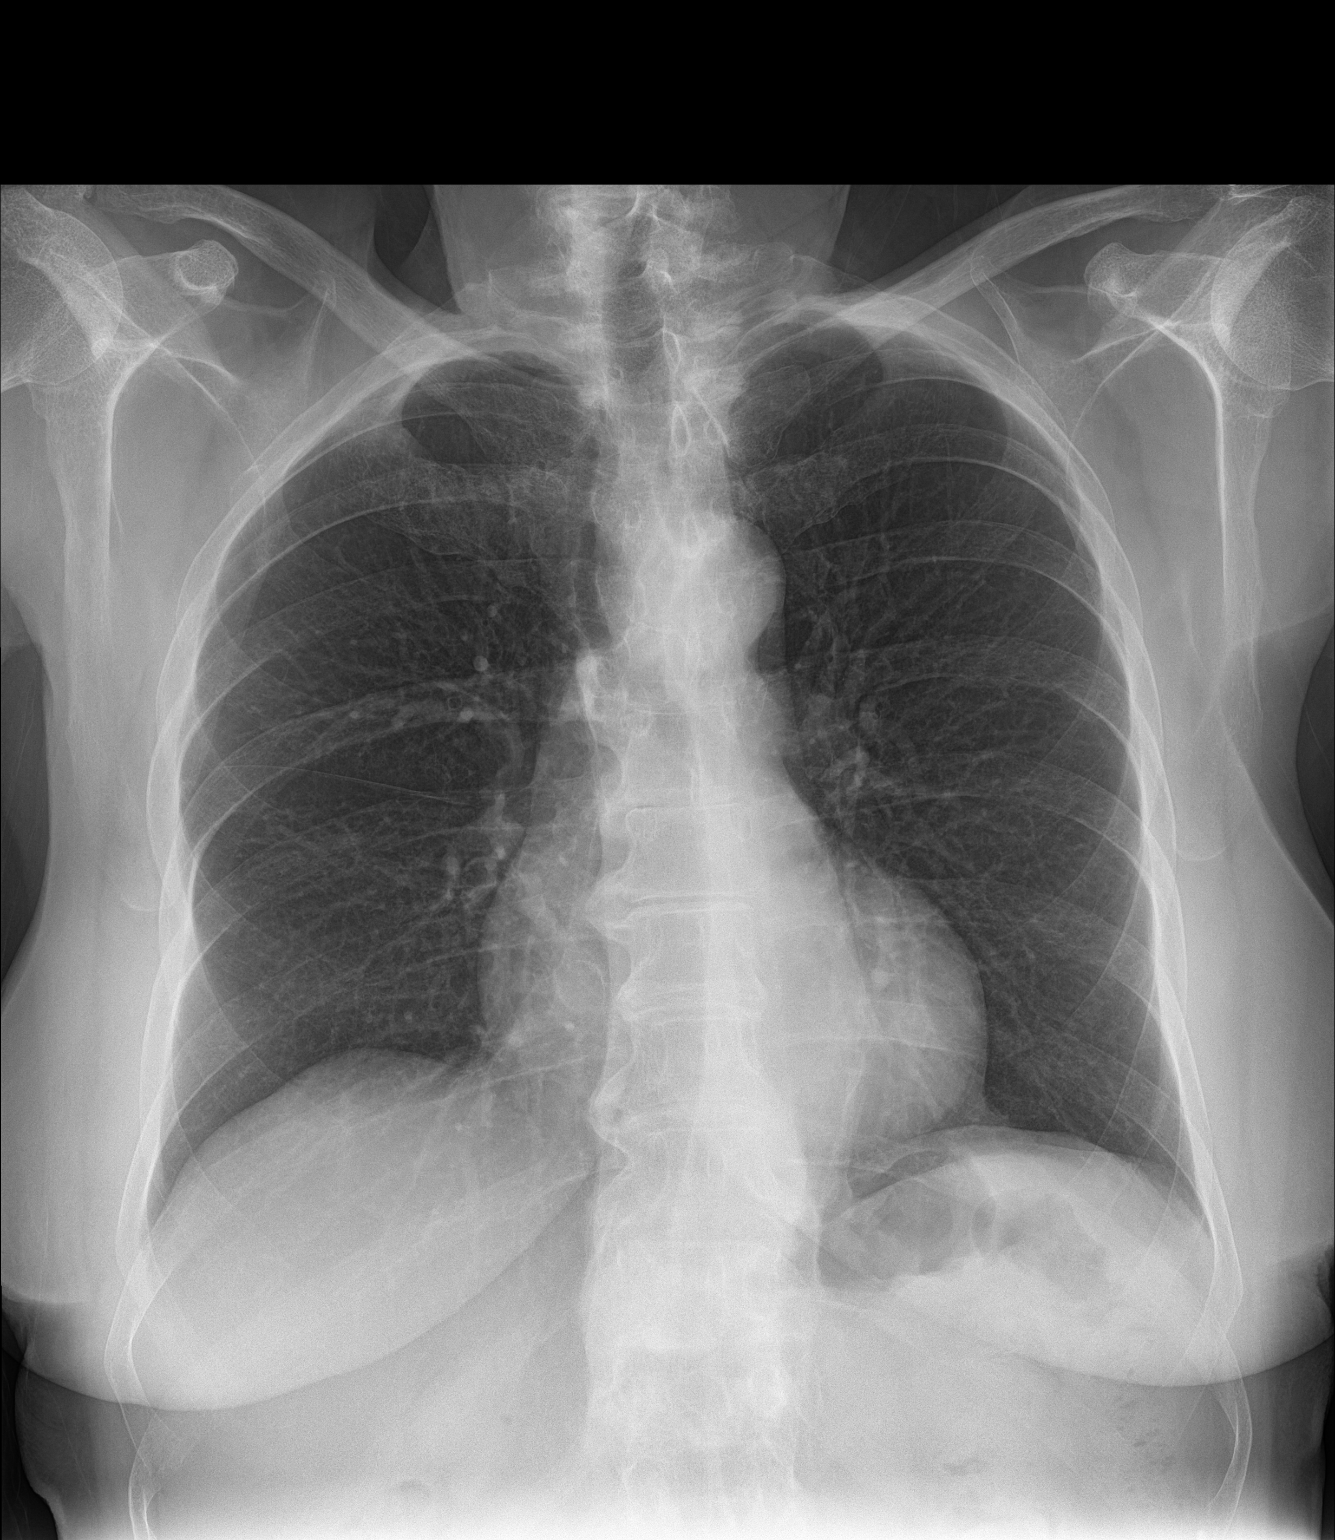

[chest lat]
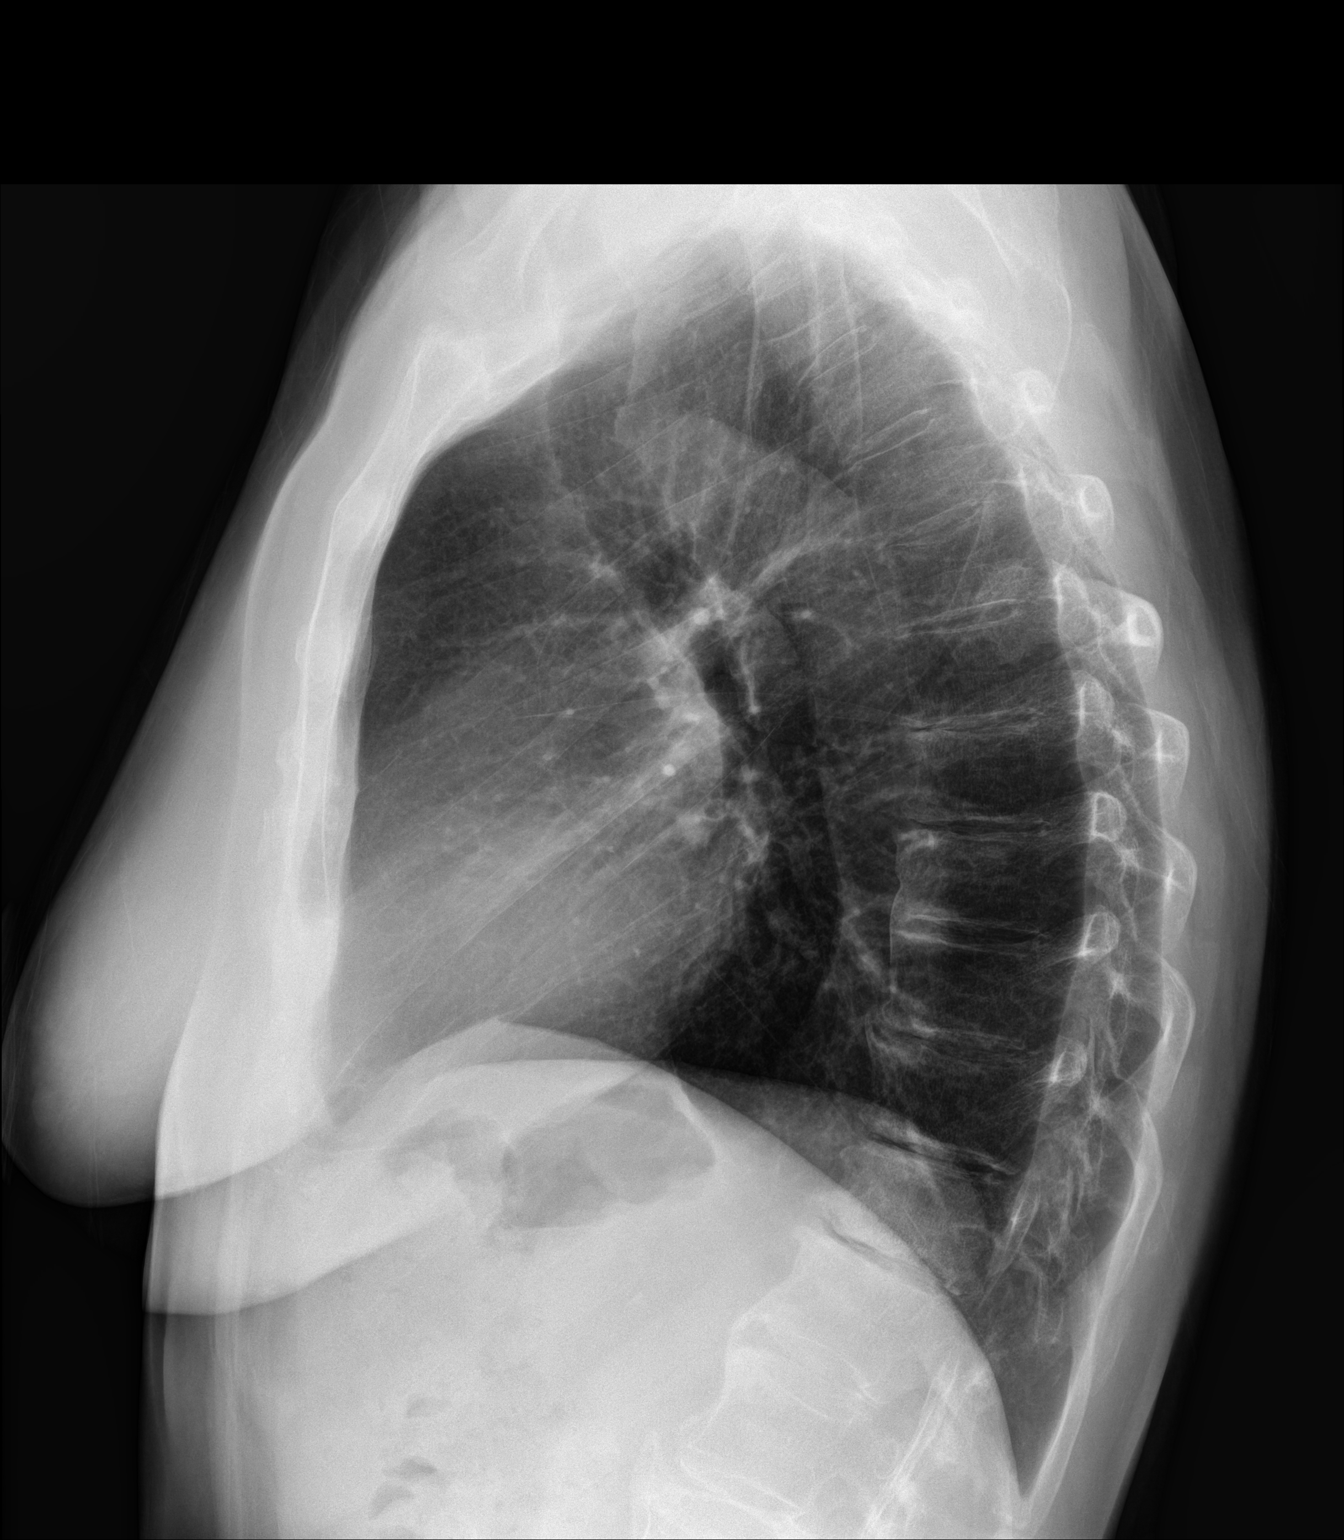

[2 of 2 positions shown; findings below may reference images not displayed]

FINDINGS: Lungs are clear. No pleural effusion or pneumothorax.

The heart is normal in size.

Degenerative changes of the visualized thoracolumbar spine.
IMPRESSION: Normal chest radiographs.

## 2020-05-12 DIAGNOSIS — H6983 Other specified disorders of Eustachian tube, bilateral: Secondary | ICD-10-CM | POA: Diagnosis not present

## 2020-05-18 ENCOUNTER — Other Ambulatory Visit: Payer: Self-pay | Admitting: Internal Medicine

## 2020-05-18 DIAGNOSIS — Z1231 Encounter for screening mammogram for malignant neoplasm of breast: Secondary | ICD-10-CM

## 2020-05-18 NOTE — Progress Notes (Signed)
Assessment and Plan:  Latoya [name] was seen today for dizziness and fatigue.  Diagnoses and all orders for this visit:  Flank pain LLQ tenderness, no CVA tenderness, does report hx of renal calculi, sx appear mild Check KUB, CBC, CMP, UA, consider CT if suggestive of calculi, alternately possible UTI,  Hx doesn't strongly suggest  -     DG Abd 1 View; Future -     CBC with Differential/Platelet -     COMPLETE METABOLIC PANEL WITH GFR -     Urinalysis w microscopic + reflex cultur  Dysuria R/o UTI, possible renal stone, consider CT pending results -     Urinalysis w microscopic + reflex cultur  Episode of dizziness Suspect r/t ear sx, neuro is otherwise essentially normal Check labs for dehydration, r/o anemia, consider CT/MRI head if persistent despite tx of eustachian tube dysfunction and serous otitis media, she is admittedly very poorly compliant with treatment of this recurrent issue Doubt cardiac etiology based on exam and HPI but would consider if otherwise poorly explained patient to go to ER if there is weakness, thunderclap headache, visual changes, or any concerning factors -     CBC with Differential/Platelet -     COMPLETE METABOLIC PANEL WITH GFR -     Urinalysis w microscopic + reflex cultur -     meclizine (ANTIVERT) 25 MG tablet; 1/2-1 pill up to 3 times daily for motion sickness/dizziness  Recurrent acute serous otitis media of right ear History of eustachian tube dysfunction Recurrent; -Allergy pill, flonase, autoinflation, explained no need for ABX at this time.  Emphasized need for compliance with treatment for resolution and considering  -     dexamethasone (DECADRON) 0.5 MG tablet; Take 2 tablet PO for 5 days and then 1 tablet PO QDaily for 5 days. -     fexofenadine (ALLEGRA) 180 MG tablet; Take 1 tablet (180 mg total) by mouth daily. -     fluticasone (FLONASE) 50 MCG/ACT nasal spray; SHAKE LIQUID AND USE 2 SPRAYS IN EACH NOSTRIL AT BEDTIME FOR FLUID IN  EARS   Further disposition pending results of labs. Discussed med's effects and SE's.   Over 30 minutes of exam, counseling, chart review, and critical decision making was performed.   Future Appointments  Date Time Provider [place]  05/25/2020  9:30 AM Unk Pinto, MD [facility] None  06/23/2020  9:10 AM [facility] 2 [facility]  11/08/2020 10:00 AM [name], NP [facility] None    ------------------------------------------------------------------------------------------------------------------   HPI BP 128/90   Pulse 74   Temp (!) 97.5 F (36.4 C)   Wt 138 lb (62.6 kg)   SpO2 98%   BMI 24.45 kg/m   69 y.o.female never smoker with htn, chronic rhinitis, anxiety presents for evaluation of dizziness and abd/flank pain and urinary sx. Notably she reports is stressed and not sleeping well due to husband with end stage COPD with resp failure. She is somewhat difficult to direct and interview-   She reports dizziness started a several weeks ago, at last visit on 04/20/2020 was advised she had serous effusion, hx of recurrent similar, was given zpak and advised start zyrtec but admits hasn't been doing so. Saw [facility] 05/12/2020, bil eustachian tube dysfunction, per their notes she did antihistamine and nasal spray and reported improved sx, diagnosed eustachian tube dysfunction and advised to continue antihistamine and steroid nasal spray. She admits hasn't been taking this.   She reports dizziness with head movement/position changes, has  pressure in R ear and tenderness in neck she has sensation that she is spinning and will start to lose balance,  She does have hearing decline at baseline, no changes Endorses hx of tinnitus, intermittent but not in several days  Denies HA, vision changes, weakness, numbness/tingling, falls She had CT head without contrast in 11/2013 which was unremarkable   Her blood pressure has been controlled at home, today their BP is  BP: 128/90  She does workout. She denies chest pain, shortness of breath, edema, fatigue, neck/arm pain.    She also 2-3 days of L flank/abdominal pain (does have hx of renal calculi, remote), stabbing, wrapping around to stomach, 4/10, interimttent. Also dysuria, frequency (though states "I drink a lot of water"), does endorse new urgency, wonders if she has infection or stone. Denies hematuria, denies cloudy urine. Hasn't tried anything for this.   BMI is Body mass index is 24.45 kg/m. Wt Readings from Last 3 Encounters:  05/19/20 138 lb (62.6 kg)  04/20/20 138 lb 12.8 oz (63 kg)  02/10/20 138 lb (62.6 kg)   Last colonoscopy   Past Medical History:  Diagnosis Date  . Anxiety and depression   . Arthritis   . Colon polyps   . GERD (gastroesophageal reflux disease)   . Hemorrhoids   . Hiatal hernia   . History of kidney stones    passed  . Hyperlipemia   . Mixed hyperlipidemia 11/26/2013  . Panic disorder   . Passive smoke exposure 07/17/2018  . Renal insufficiency    stage 3 kidney disease per her PCP  . Stomach ulcer   . Unspecified essential hypertension 11/26/2013     Allergies  Allergen Reactions  . Other     All pain medications: Unknown/ CODEINE  . Prednisone Other (See Comments)    DYSPHORIA  . Vioxx [Rofecoxib]     unknown  . Entex T [Pseudoephedrine-Guaifenesin] Palpitations  . Levaquin [Levofloxacin] Rash    Tolerated Avelox without adverse effect 11/08/15.  . Sulfa Antibiotics Rash  . Trovan [Alatrofloxacin] Rash    Current Outpatient Medications on File Prior to Visit  Medication Sig  . alendronate (FOSAMAX) 70 MG tablet Take 1 tablet  1 x /week on an Empty Stomach for 30 minutes for Osteoporosis  . aspirin 81 MG tablet Take 81 mg by mouth at bedtime.   . cholecalciferol (VITAMIN D) 1000 units tablet Take 10,000 Units by mouth daily.  . clonazePAM (KLONOPIN) 1 MG tablet clonazepam 1 mg tablet  . diltiazem (CARDIZEM) 120 MG tablet Take 1 tablet 2 x /day  for BP  . famotidine (PEPCID) 20 MG tablet 20 mg daily. As needed.  . furosemide (LASIX) 20 MG tablet Take 1 tablet (20 mg total) by mouth daily as needed for edema.  Earley Abide Compounding Base LIQD Take 5 mLs by mouth every 2 (two) hours as needed.  . potassium chloride SA (K-DUR) 20 MEQ tablet Take 1 tablet 2 x /day for Potassium  . pravastatin (PRAVACHOL) 40 MG tablet Take 1 tablet Daily for Cholesterol  . promethazine-dextromethorphan (PROMETHAZINE-DM) 6.25-15 MG/5ML syrup TAKE 5 ML BY MOUTH FOUR TIMES DAILY AS NEEDED FOR COUGH  . propranolol (INDERAL) 10 MG tablet Take 1 tablet 2 x /day for Anxiety  . sertraline (ZOLOFT) 100 MG tablet Take 1 tablet Daily for  Mood   No current facility-administered medications on file prior to visit.    ROS: all negative except above.   Physical Exam:  BP 128/90   Pulse  74   Temp (!) 97.5 F (36.4 C)   Wt 138 lb (62.6 kg)   SpO2 98%   BMI 24.45 kg/m   General Appearance: Well nourished, well dressed elderly female, in no acute distress. Eyes: PERRLA, EOMs, conjunctiva no swelling or erythema Sinuses: No Frontal/maxillary tenderness ENT/Mouth: Ext aud canals clear, small scab to R. No auricle or tragal tenderness. TMs without erythema, bulging. R with fluid bubble visible. No erythema, swelling, or exudate on post pharynx.  Tonsils not swollen or erythematous. Hearing normal.  Neck: Supple, thyroid normal. Mildly tender over upper anterior neck below ear/jaw. Dizzy with any head movement, not specifically with neck extension.  Respiratory: Respiratory effort normal, BS equal bilaterally without rales, rhonchi, wheezing or stridor.  Cardio: RRR with no MRGs. Brisk peripheral pulses without edema.  Abdomen: Soft, + BS. She has left lateral and LLQ abdominal tenderness, no guarding, rebound, hernias, masses. No CVA tenderness Lymphatics: without lymphadenopathy.  Musculoskeletal: Full ROM, 5/5 strength, normal gait.  Skin: Warm, dry without  rashes, lesions, ecchymosis.  Neuro: Cranial nerves intact. Normal muscle tone, no cerebellar symptoms. Sensation intact. Negative pronator drift though unsteady on feet; negative nose to finger, rapid alternating movements. Gait unsteady.  Psych: Awake and oriented X 3, anxious affect, Insight and Judgment appropriate.     Izora Ribas, NP 5:55 PM [facility]

## 2020-05-19 ENCOUNTER — Ambulatory Visit
Admission: RE | Admit: 2020-05-19 | Discharge: 2020-05-19 | Disposition: A | Discharge status: Home / Self Care | Payer: PPO | Source: Ambulatory Visit | Attending: Adult Health | Admitting: Adult Health

## 2020-05-19 ENCOUNTER — Ambulatory Visit (INDEPENDENT_AMBULATORY_CARE_PROVIDER_SITE_OTHER): Payer: PPO | Admitting: Adult Health

## 2020-05-19 ENCOUNTER — Other Ambulatory Visit: Payer: Self-pay

## 2020-05-19 ENCOUNTER — Encounter: Payer: Self-pay | Admitting: Adult Health

## 2020-05-19 VITALS — BP 128/90 | HR 74 | Temp 97.5°F | Wt 138.0 lb

## 2020-05-19 DIAGNOSIS — R109 Unspecified abdominal pain: Secondary | ICD-10-CM | POA: Diagnosis not present

## 2020-05-19 DIAGNOSIS — R42 Dizziness and giddiness: Secondary | ICD-10-CM

## 2020-05-19 DIAGNOSIS — R300 Dysuria: Secondary | ICD-10-CM

## 2020-05-19 DIAGNOSIS — H6504 Acute serous otitis media, recurrent, right ear: Secondary | ICD-10-CM | POA: Insufficient documentation

## 2020-05-19 DIAGNOSIS — Z8669 Personal history of other diseases of the nervous system and sense organs: Secondary | ICD-10-CM | POA: Insufficient documentation

## 2020-05-19 MED ORDER — FEXOFENADINE HCL 180 MG PO TABS: 180.0000 mg | ORAL_TABLET | Freq: Every day | ORAL | 2 refills | Status: DC

## 2020-05-19 MED ORDER — FLUTICASONE PROPIONATE 50 MCG/ACT NA SUSP: NASAL | 2 refills | Status: DC

## 2020-05-19 MED ORDER — MECLIZINE HCL 25 MG PO TABS: ORAL_TABLET | ORAL | 0 refills | Status: DC

## 2020-05-19 MED ORDER — DEXAMETHASONE 0.5 MG PO TABS: ORAL_TABLET | ORAL | 0 refills | Status: DC

## 2020-05-19 NOTE — Patient Instructions (Addendum)
Please get chest xray at [address] - walk in and give your name    Your ears and sinuses are connected by the eustachian tube. When your sinuses are inflamed, this can close off the tube and cause fluid to collect in your middle ear. This can then cause dizziness, popping, clicking, ringing, and echoing in your ears. This is often NOT an infection and does NOT require antibiotics, it is caused by inflammation so the treatments help the inflammation. This can take a long time to get better so please be patient.  Here are things you can do to help with this: - Try the Flonase or Nasonex. Remember to spray each nostril twice towards the outer part of your eye.  Do not sniff but instead pinch your nose and tilt your head back to help the medicine get into your sinuses.  The best time to do this is at bedtime.Stop if you get blurred vision or nose bleeds.  -While drinking fluids, pinch and hold nose close and swallow, to help open eustachian tubes to drain fluid behind ear drums. -Please pick one of the over the counter allergy medications below and take it once daily for allergies.  It will also help with fluid behind ear drums. Claritin or loratadine cheapest but likely the weakest  Zyrtec or certizine at night because it can make you sleepy The strongest is allegra or fexafinadine  Cheapest at [facility], [employer], [employer] -can use decongestant over the counter, please do not use if you have high blood pressure or certain heart conditions.   if worsening HA, changes vision/speech, imbalance, weakness go to the ER    Medicines you can use  Nasal congestion  Little Remedies saline spray (aerosol/mist)- can try this, it is in the kids section - pseudoephedrine (Sudafed)- behind the counter, do not use if you have high blood pressure, medicine that have -D in them.  - phenylephrine (Sudafed PE) -Dextormethorphan + chlorpheniramine (Coridcidin HBP)- okay if you have high blood  pressure -Oxymetazoline (Afrin) nasal spray- LIMIT to 3 days -Saline nasal spray -Neti pot (used distilled or bottled water)  Ear pain/congestion  -pseudoephedrine (sudafed) - Nasonex/flonase nasal spray  Sore Throat  -Acetaminophen (Tyelnol) -Ibuprofen (Advil, motrin, aleve) -Drink a lot of water -Gargle with salt water - Rest your voice (don't talk) -Throat sprays -Cough drops  Allergy symptoms (cough, sneeze, runny nose, itchy eyes) -Claritin or loratadine cheapest but likely the weakest  -Zyrtec or certizine at night because it can make you sleepy -The strongest is allegra or fexafinadine  Cheapest at [employer], [employer], [employer]         Otitis Media, Adult  Otitis media occurs when there is inflammation and fluid in the middle ear. Your middle ear is a part of the ear that contains bones for hearing as well as air that helps send sounds to your brain. What are the causes? This condition is caused by a blockage in the eustachian tube. This tube drains fluid from the ear to the back of the nose (nasopharynx). A blockage in this tube can be caused by an object or by swelling (edema) in the tube. Problems that can cause a blockage include:  A cold or other upper respiratory infection.  Allergies.  An irritant, such as tobacco smoke.  Enlarged adenoids. The adenoids are areas of soft tissue located high in the back of the throat, behind the nose and the roof of the mouth.  A mass in the nasopharynx.  Damage  to the ear caused by pressure changes (barotrauma). What are the signs or symptoms? Symptoms of this condition include:  Ear pain.  A fever.  Decreased hearing.  A headache.  Tiredness (lethargy).  Fluid leaking from the ear.  Ringing in the ear. How is this diagnosed? This condition is diagnosed with a physical exam. During the exam your health care provider will use an instrument called an otoscope to look into your ear and check for redness, swelling,  and fluid. He or she will also ask about your symptoms. Your health care provider may also order tests, such as:  A test to check the movement of the eardrum (pneumatic otoscopy). This test is done by squeezing a small amount of air into the ear.  A test that changes air pressure in the middle ear to check how well the eardrum moves and whether the eustachian tube is working (tympanogram). How is this treated? This condition usually goes away on its own within 3-5 days. But if the condition is caused by a bacteria infection and does not go away own its own, or keeps coming back, your health care provider may:  Prescribe antibiotic medicines to treat the infection.  Prescribe or recommend medicines to control pain. Follow these instructions at home:  Take over-the-counter and prescription medicines only as told by your health care provider.  If you were prescribed an antibiotic medicine, take it as told by your health care provider. Do not stop taking the antibiotic even if you start to feel better.  Keep all follow-up visits as told by your health care provider. This is important. Contact a health care provider if:  You have bleeding from your nose.  There is a lump on your neck.  You are not getting better in 5 days.  You feel worse instead of better. Get help right away if:  You have severe pain that is not controlled with medicine.  You have swelling, redness, or pain around your ear.  You have stiffness in your neck.  A part of your face is paralyzed.  The bone behind your ear (mastoid) is tender when you touch it.  You develop a severe headache. Summary  Otitis media is redness, soreness, and swelling of the middle ear.  This condition usually goes away on its own within 3-5 days.  If the problem does not go away in 3-5 days, your health care provider may prescribe or recommend medicines to treat your symptoms.  If you were prescribed an antibiotic medicine, take  it as told by your health care provider. This information is not intended to replace advice given to you by your health care provider. Make sure you discuss any questions you have with your health care provider. Document Revised: 08/03/2017 Document Reviewed: 08/11/2016 Elsevier Patient Education  [place].     Dizziness Dizziness is a common problem. It is a feeling of unsteadiness or light-headedness. You may feel like you are about to faint. Dizziness can lead to injury if you stumble or fall. Anyone can become dizzy, but dizziness is more common in older adults. This condition can be caused by a number of things, including medicines, dehydration, or illness. Follow these instructions at home: Eating and drinking  Drink enough fluid to keep your urine clear or pale yellow. This helps to keep you from becoming dehydrated. Try to drink more clear fluids, such as water.  Do not drink alcohol.  Limit your caffeine intake if told to do so by  your health care provider. Check ingredients and nutrition facts to see if a food or beverage contains caffeine.  Limit your salt (sodium) intake if told to do so by your health care provider. Check ingredients and nutrition facts to see if a food or beverage contains sodium. Activity  Avoid making quick movements. ? Rise slowly from chairs and steady yourself until you feel okay. ? In the morning, first sit up on the side of the bed. When you feel okay, stand slowly while you hold onto something until you know that your balance is fine.  If you need to stand in one place for a long time, move your legs often. Tighten and relax the muscles in your legs while you are standing.  Do not drive or use heavy machinery if you feel dizzy.  Avoid bending down if you feel dizzy. Place items in your home so that they are easy for you to reach without leaning over. Lifestyle  Do not use any products that contain nicotine or tobacco, such as  cigarettes and e-cigarettes. If you need help quitting, ask your health care provider.  Try to reduce your stress level by using methods such as yoga or meditation. Talk with your health care provider if you need help to manage your stress. General instructions  Watch your dizziness for any changes.  Take over-the-counter and prescription medicines only as told by your health care provider. Talk with your health care provider if you think that your dizziness is caused by a medicine that you are taking.  Tell a friend or a family member that you are feeling dizzy. If he or she notices any changes in your behavior, have this person call your health care provider.  Keep all follow-up visits as told by your health care provider. This is important. Contact a health care provider if:  Your dizziness does not go away.  Your dizziness or light-headedness gets worse.  You feel nauseous.  You have reduced hearing.  You have new symptoms.  You are unsteady on your feet or you feel like the room is spinning. Get help right away if:  You vomit or have diarrhea and are unable to eat or drink anything.  You have problems talking, walking, swallowing, or using your arms, hands, or legs.  You feel generally weak.  You are not thinking clearly or you have trouble forming sentences. It may take a friend or family member to notice this.  You have chest pain, abdominal pain, shortness of breath, or sweating.  Your vision changes.  You have any bleeding.  You have a severe headache.  You have neck pain or a stiff neck.  You have a fever. These symptoms may represent a serious problem that is an emergency. Do not wait to see if the symptoms will go away. Get medical help right away. Call your local emergency services (911 in the [place] Do not drive yourself to the hospital. Summary  Dizziness is a feeling of unsteadiness or light-headedness. This condition can be caused by a number of  things, including medicines, dehydration, or illness.  Anyone can become dizzy, but dizziness is more common in older adults.  Drink enough fluid to keep your urine clear or pale yellow. Do not drink alcohol.  Avoid making quick movements if you feel dizzy. Monitor your dizziness for any changes. This information is not intended to replace advice given to you by your health care provider. Make sure you discuss any questions you have with  your health care provider. Document Revised: 08/24/2017 Document Reviewed: 09/23/2016 Elsevier Patient Education  2020 [employer].

## 2020-05-20 ENCOUNTER — Other Ambulatory Visit: Payer: Self-pay | Admitting: Adult Health

## 2020-05-20 DIAGNOSIS — N289 Disorder of kidney and ureter, unspecified: Secondary | ICD-10-CM

## 2020-05-21 LAB — CBC WITH DIFFERENTIAL/PLATELET
Absolute Monocytes: 521 cells/uL (ref 200–950)
Basophils Absolute: 33 cells/uL (ref 0–200)
Basophils Relative: 0.5 %
Eosinophils Absolute: 99 cells/uL (ref 15–500)
Eosinophils Relative: 1.5 %
HCT: 39.8 % (ref 35.0–45.0)
Hemoglobin: 13.3 g/dL (ref 11.7–15.5)
Lymphs Abs: 1637 cells/uL (ref 850–3900)
MCH: 30.5 pg (ref 27.0–33.0)
MCHC: 33.4 g/dL (ref 32.0–36.0)
MCV: 91.3 fL (ref 80.0–100.0)
MPV: 9.9 fL (ref 7.5–12.5)
Monocytes Relative: 7.9 %
Neutro Abs: 4310 cells/uL (ref 1500–7800)
Neutrophils Relative %: 65.3 %
Platelets: 185 10*3/uL (ref 140–400)
RBC: 4.36 10*6/uL (ref 3.80–5.10)
RDW: 12.9 % (ref 11.0–15.0)
Total Lymphocyte: 24.8 %
WBC: 6.6 10*3/uL (ref 3.8–10.8)

## 2020-05-21 LAB — COMPLETE METABOLIC PANEL WITH GFR
AG Ratio: 1.8 (calc) (ref 1.0–2.5)
ALT: 14 U/L (ref 6–29)
AST: 28 U/L (ref 10–35)
Albumin: 4.2 g/dL (ref 3.6–5.1)
Alkaline phosphatase (APISO): 81 U/L (ref 37–153)
BUN/Creatinine Ratio: 11 (calc) (ref 6–22)
BUN: 15 mg/dL (ref 7–25)
CO2: 28 mmol/L (ref 20–32)
Calcium: 9.5 mg/dL (ref 8.6–10.4)
Chloride: 110 mmol/L (ref 98–110)
Creat: 1.31 mg/dL — ABNORMAL HIGH (ref 0.60–0.93)
GFR, Est African American: 48 mL/min/{1.73_m2} — ABNORMAL LOW (ref >=60)
GFR, Est Non African American: 41 mL/min/{1.73_m2} — ABNORMAL LOW (ref >=60)
Globulin: 2.3 g/dL (calc) (ref 1.9–3.7)
Glucose, Bld: 108 mg/dL — ABNORMAL HIGH (ref 65–99)
Potassium: 3.7 mmol/L (ref 3.5–5.3)
Sodium: 145 mmol/L (ref 135–146)
Total Bilirubin: 0.4 mg/dL (ref 0.2–1.2)
Total Protein: 6.5 g/dL (ref 6.1–8.1)

## 2020-05-21 LAB — URINALYSIS W MICROSCOPIC + REFLEX CULTURE
Bilirubin Urine: NEGATIVE
Glucose, UA: NEGATIVE
Hgb urine dipstick: NEGATIVE
Nitrites, Initial: NEGATIVE
Protein, ur: NEGATIVE
RBC / HPF: NONE SEEN /HPF (ref 0–2)
Specific Gravity, Urine: 1.026 (ref 1.001–1.03)
pH: 5.5 (ref 5.0–8.0)

## 2020-05-21 LAB — URINE CULTURE
MICRO NUMBER:: 10956698
SPECIMEN QUALITY:: ADEQUATE

## 2020-05-21 LAB — CULTURE INDICATED

## 2020-05-23 ENCOUNTER — Other Ambulatory Visit: Payer: Self-pay | Admitting: Internal Medicine

## 2020-05-23 DIAGNOSIS — F419 Anxiety disorder, unspecified: Secondary | ICD-10-CM

## 2020-05-24 NOTE — Progress Notes (Signed)
History of Present Illness:       This very nice 69 y.o.  MWF presents for 6 month follow up with HTN, HLD, Pre-Diabetes and Vitamin D Deficiency.       Patient was evaluated   5  days ago and dx'd with serous OM & Vertigo and labs found Nl CBC, but CMET showed drop in GFR from 54 to 41 and she was encouraged to drink more fluids.        Patient has hx/o Chronic Anxiety & Depression & has been followed by Dr Casimiro Needle in the past.      Patient is treated for HTN (1985)  & BP has been controlled at home. Today's BP is at goal - 124/82. Patient has had no complaints of any cardiac type chest pain, palpitations, dyspnea / orthopnea / PND, dizziness, claudication, or dependent edema.      Hyperlipidemia is controlled with diet & meds. Patient denies myalgias or other med SE's. Last Lipids were at goal:  Lab Results  Component Value Date   CHOL 175 11/06/2019   HDL 50 11/06/2019   LDLCALC 100 (H) 11/06/2019   TRIG 148 11/06/2019   CHOLHDL 3.5 11/06/2019    Also, the patient has history of PreDiabetes(A1c 5.8% /2014 & 5.7% /2015 w/elevated Insulin 40) and has had no symptoms of reactive hypoglycemia, diabetic polys, paresthesias or visual blurring.  Last A1c was  Normal and at goal:  Lab Results  Component Value Date   HGBA1C 5.3 11/06/2019       Further, the patient also has history of Vitamin D Deficiency and supplements vitamin D without any suspected side-effects. Last vitamin D was at goal:  Lab Results  Component Value Date   VD25OH 65 11/06/2019    Current Outpatient Medications on File Prior to Visit  Medication Sig  . alendronate (FOSAMAX) 70 MG tablet Take 1 tablet  1 x /week   . aspirin 81 MG tablet Take 81 mg by mouth at bedtime.   . cholecalciferol (VITAMIN D) 1000 units tablet Take 10,000 Units by mouth daily.  . clonazePAM (KLONOPIN) 1 MG tablet clonazepam 1 mg tablet  . diltiazem (CARDIZEM) 120 MG tablet Take 1 tablet 2 x /day for BP  . famotidine  (PEPCID) 20 MG tablet 20 mg daily. As needed.  . fexofenadine (ALLEGRA) 180 MG tablet Take 1 tablet (180 mg total) by mouth daily.  Marland Kitchen FLONASE  nasal spray 2 SPRAYS AT BEDTIME   . furosemide (LASIX) 20 MG tablet Take 1 tablet ( daily as needed for edema.  . meclizine (ANTIVERT) 25 MG tablet 1/2-1 pill up to 3 times daily   . K-DUR 20 MEQ tablet Take 1 tablet 2 x /day for Potassium  . pravastatin (PRAVACHOL) 40 MG tablet Take 1 tablet Daily for Cholesterol  . promethazine-DM) 6.25-15 MG/5ML syrup TAKE 5 ML  4 x / DAILY   . propranolol (INDERAL) 10 MG tablet TAKE 1 TABLET TWICE DAILY FOR ANXIETY  . sertraline (ZOLOFT) 100 MG tablet Take 1 tablet Daily for  Mood     Allergies  Allergen Reactions  . Other     All pain medications: Unknown/ CODEINE  . Prednisone Other (See Comments)    DYSPHORIA  . Vioxx [Rofecoxib]     unknown  . Entex T [Pseudoephedrine-Guaifenesin] Palpitations  . Levaquin [Levofloxacin] Rash    Tolerated Avelox without adverse effect 11/08/15.  . Sulfa Antibiotics Rash  . Trovan [Alatrofloxacin] Rash  PMHx:   Past Medical History:  Diagnosis Date  . Anxiety and depression   . Arthritis   . Colon polyps   . GERD (gastroesophageal reflux disease)   . Hemorrhoids   . Hiatal hernia   . History of kidney stones    passed  . Hyperlipemia   . Mixed hyperlipidemia 11/26/2013  . Panic disorder   . Passive smoke exposure 07/17/2018  . Renal insufficiency    stage 3 kidney disease per her PCP  . Stomach ulcer   . Unspecified essential hypertension 11/26/2013    Immunization History  Administered Date(s) Administered  . Influenza Split 06/04/2014, 06/05/2015  . Influenza, High Dose Seasonal PF 05/30/2017, 05/22/2018, 05/21/2019  . Influenza-Unspecified 06/06/2013, 06/13/2016  . Pneumococcal Conjugate-13 06/05/2015  . Pneumococcal Polysaccharide-23 04/06/2014  . Pneumococcal-Unspecified 09/04/1996, 09/04/2016  . Td 09/04/2005  . Tdap 03/22/2017    Past  Surgical History:  Procedure Laterality Date  . ABDOMINAL HYSTERECTOMY    . CARPOMETACARPEL SUSPENSION PLASTY Right 12/08/2013   Procedure: CARPOMETACARPEL [facility]) SUSPENSION PLASTY;  Surgeon: Jolyn Nap, MD;  Location: [place];  Service: Orthopedics;  Laterality: Right;  . CATARACT EXTRACTION Right 2020   Dr. Pandora Leiter   . COLONOSCOPY    . FOOT SURGERY Left 2008   Bunion and pulled tendons  . HAND SURGERY Right    Dog Bite  . I & D EXTREMITY Right 01/20/2017   Procedure: RIGHT HAND IRRIGATION AND DEBRIDEMENT AND REPAIR AS INDICATED;  Surgeon: Iran Planas, MD;  Location: [place];  Service: Orthopedics;  Laterality: Right;  . OOPHORECTOMY Right   . TUBAL LIGATION    . UPPER GASTROINTESTINAL ENDOSCOPY      FHx:    Reviewed / unchanged  SHx:    Reviewed / unchanged   Systems Review:  Constitutional: Denies fever, chills, wt changes, headaches, insomnia, fatigue, night sweats, change in appetite. Eyes: Denies redness, blurred vision, diplopia, discharge, itchy, watery eyes.  ENT: Denies discharge, congestion, post nasal drip, epistaxis, sore throat, earache, hearing loss, dental pain, tinnitus, vertigo, sinus pain, snoring.  CV: Denies chest pain, palpitations, irregular heartbeat, syncope, dyspnea, diaphoresis, orthopnea, PND, claudication or edema. Respiratory: denies cough, dyspnea, DOE, pleurisy, hoarseness, laryngitis, wheezing.  Gastrointestinal: Denies dysphagia, odynophagia, heartburn, reflux, water brash, abdominal pain or cramps, nausea, vomiting, bloating, diarrhea, constipation, hematemesis, melena, hematochezia  or hemorrhoids. Genitourinary: Denies dysuria, frequency, urgency, nocturia, hesitancy, discharge, hematuria or flank pain. Musculoskeletal: Denies arthralgias, myalgias, stiffness, jt. swelling, pain, limping or strain/sprain.  Skin: Denies pruritus, rash, hives, warts, acne, eczema or change in skin lesion(s). Neuro: No weakness, tremor,  incoordination, spasms, paresthesia or pain. Psychiatric: Denies confusion, memory loss or sensory loss. Endo: Denies change in weight, skin or hair change.  Heme/Lymph: No excessive bleeding, bruising or enlarged lymph nodes.  Physical Exam  BP 124/82   Pulse 82   Temp (!) 97.2 F (36.2 C)   Wt 136 lb (61.7 kg)   SpO2 98%   BMI 24.09 kg/m   Appears  well nourished, well groomed  and in no distress.  Eyes: PERRLA, EOMs, conjunctiva no swelling or erythema. Sinuses: No frontal/maxillary tenderness ENT/Mouth: EAC's clear, TM's nl w/o erythema, bulging. Nares clear w/o erythema, swelling, exudates. Oropharynx clear without erythema or exudates. Oral hygiene is good. Tongue normal, non obstructing. Hearing intact.  Neck: Supple. Thyroid not palpable. Car 2+/2+ without bruits, nodes or JVD. Chest: Respirations nl with BS clear & equal w/o rales, rhonchi, wheezing or stridor.  Cor: Heart sounds  normal w/ regular rate and rhythm without sig. murmurs, gallops, clicks or rubs. Peripheral pulses normal and equal  without edema.  Abdomen: Soft & bowel sounds normal. Non-tender w/o guarding, rebound, hernias, masses or organomegaly.  Lymphatics: Unremarkable.  Musculoskeletal: Full ROM all peripheral extremities, joint stability, 5/5 strength and normal gait.  Skin: Warm, dry without exposed rashes, lesions or ecchymosis apparent.  Neuro: Cranial nerves intact, reflexes equal bilaterally. Sensory-motor testing grossly intact. Tendon reflexes grossly intact.  Pysch: Alert & oriented x 3.  Insight and judgement nl & appropriate. No ideations.  Assessment and Plan:  1. Essential hypertension  - Continue medication, monitor blood pressure at home.  - Continue DASH diet.  Reminder to go to the ER if any CP,  SOB, nausea, dizziness, severe HA, changes vision/speech.  - Magnesium - TSH  2. H - Continue diet/meds, exercise,& lifestyle modifications.  - Continue monitor periodic  cholesterol/liver & renal functions  yperlipidemia, mixed  - Lipid panel - TSH  3. Abnormal glucose  - Continue diet, exercise  - Lifestyle modifications.  - Monitor appropriate labs.  - Hemoglobin A1c - Insulin, random  4. Vitamin D deficiency  - Continue supplementation.  - VITAMIN D 25 Hydroxy  5. Medication management  - Magnesium - Lipid panel - TSH - Hemoglobin A1c - Insulin, random - VITAMIN D 25 Hydroxy         Discussed  regular exercise, BP monitoring, weight control to achieve/maintain BMI less than 25 and discussed med and SE's. Recommended labs to assess and monitor clinical status with further disposition pending results of labs.  I discussed the assessment and treatment plan with the patient. The patient was provided an opportunity to ask questions and all were answered. The patient agreed with the plan and demonstrated an understanding of the instructions.  I provided over 30 minutes of exam, counseling, chart review and  complex critical decision making.   Kirtland Bouchard, MD

## 2020-05-24 NOTE — Patient Instructions (Signed)

## 2020-05-25 ENCOUNTER — Ambulatory Visit (INDEPENDENT_AMBULATORY_CARE_PROVIDER_SITE_OTHER): Payer: PPO | Admitting: Internal Medicine

## 2020-05-25 ENCOUNTER — Other Ambulatory Visit: Payer: Self-pay

## 2020-05-25 ENCOUNTER — Encounter: Payer: Self-pay | Admitting: Internal Medicine

## 2020-05-25 VITALS — BP 124/82 | HR 82 | Temp 97.2°F | Ht 63.0 in | Wt 136.0 lb

## 2020-05-25 DIAGNOSIS — Z79899 Other long term (current) drug therapy: Secondary | ICD-10-CM

## 2020-05-25 DIAGNOSIS — E559 Vitamin D deficiency, unspecified: Secondary | ICD-10-CM | POA: Diagnosis not present

## 2020-05-25 DIAGNOSIS — I10 Essential (primary) hypertension: Secondary | ICD-10-CM | POA: Diagnosis not present

## 2020-05-25 DIAGNOSIS — R7309 Other abnormal glucose: Secondary | ICD-10-CM

## 2020-05-25 DIAGNOSIS — E782 Mixed hyperlipidemia: Secondary | ICD-10-CM

## 2020-05-26 LAB — LIPID PANEL
Cholesterol: 185 mg/dL (ref <200)
HDL: 52 mg/dL (ref >=50)
LDL Cholesterol (Calc): 99 mg/dL (calc)
Non-HDL Cholesterol (Calc): 133 mg/dL (calc) — ABNORMAL HIGH (ref <130)
Total CHOL/HDL Ratio: 3.6 (calc) (ref <5.0)
Triglycerides: 219 mg/dL — ABNORMAL HIGH (ref <150)

## 2020-05-26 LAB — HEMOGLOBIN A1C
Hgb A1c MFr Bld: 5.5 % of total Hgb (ref <5.7)
Mean Plasma Glucose: 111 (calc)
eAG (mmol/L): 6.2 (calc)

## 2020-05-26 LAB — MAGNESIUM: Magnesium: 2.1 mg/dL (ref 1.5–2.5)

## 2020-05-26 LAB — VITAMIN D 25 HYDROXY (VIT D DEFICIENCY, FRACTURES): Vit D, 25-Hydroxy: 85 ng/mL (ref 30–100)

## 2020-05-26 LAB — TSH: TSH: 3.18 mIU/L (ref 0.40–4.50)

## 2020-05-26 LAB — INSULIN, RANDOM: Insulin: 10.7 u[IU]/mL

## 2020-05-26 NOTE — Progress Notes (Signed)
==========================================================  -    Chol = 185 - very good, but......................  - Triglycerides (   219   ) or fats in blood are too high  (goal is less than 150)    - Recommend avoid fried & greasy foods,  sweets / candy,   - Avoid white rice  (brown or wild rice or Quinoa is OK),   - Avoid white potatoes  (sweet potatoes are OK)   - Avoid anything made from white flour  - bagels, doughnuts, rolls, buns, biscuits, white and   wheat breads, pizza crust and traditional  pasta made of white flour & egg white  - (vegetarian pasta or spinach or wheat pasta is OK).    - Multi-grain bread is OK - like multi-grain flat bread or  sandwich thins.   - Avoid alcohol in excess.   - Exercise is also important. ==========================================================  -  A1c - Normal  - Great - No Diabetes  ==========================================================  -  Vitamin D = 85 - excellent  ==========================================================  -  All Else - CBC - Kidneys - Electrolytes - Liver & Magnesium  - all  Normal / OK ===========================================================

## 2020-05-31 ENCOUNTER — Other Ambulatory Visit: Payer: Self-pay | Admitting: *Deleted

## 2020-05-31 MED ORDER — MOUTHWASH COMPOUNDING BASE PO LIQD: 5.0000 mL | ORAL | 1 refills | Status: DC | PRN

## 2020-06-13 NOTE — Progress Notes (Deleted)
Assessment and Plan:  There are no diagnoses linked to this encounter.    Further disposition pending results of labs. Discussed med's effects and SE's.   Over 30 minutes of exam, counseling, chart review, and critical decision making was performed.   Future Appointments  Date Time Provider [place]  06/14/2020  1:45 PM Garnet Sierras, NP [facility] None  06/23/2020  9:10 AM [facility] MM 2 [facility]  08/24/2020 11:30 AM Garnet Sierras, NP [facility] None  11/08/2020 10:00 AM [name], NP [facility] None    ------------------------------------------------------------------------------------------------------------------   HPI 69 y.o.female presents for  Past Medical History:  Diagnosis Date  . Anxiety and depression   . Arthritis   . Colon polyps   . GERD (gastroesophageal reflux disease)   . Hemorrhoids   . Hiatal hernia   . History of kidney stones    passed  . Hyperlipemia   . Mixed hyperlipidemia 11/26/2013  . Panic disorder   . Passive smoke exposure 07/17/2018  . Renal insufficiency    stage 3 kidney disease per her PCP  . Stomach ulcer   . Unspecified essential hypertension 11/26/2013     Allergies  Allergen Reactions  . Other     All pain medications: Unknown/ CODEINE  . Prednisone Other (See Comments)    DYSPHORIA  . Vioxx [Rofecoxib]     unknown  . Entex T [Pseudoephedrine-Guaifenesin] Palpitations  . Levaquin [Levofloxacin] Rash    Tolerated Avelox without adverse effect 11/08/15.  . Sulfa Antibiotics Rash  . Trovan [Alatrofloxacin] Rash    Current Outpatient Medications on File Prior to Visit  Medication Sig  . alendronate (FOSAMAX) 70 MG tablet Take 1 tablet  1 x /week on an Empty Stomach for 30 minutes for Osteoporosis  . aspirin 81 MG tablet Take 81 mg by mouth at bedtime.   . cholecalciferol (VITAMIN D) 1000 units tablet Take 10,000 Units by mouth daily.  . clonazePAM (KLONOPIN) 1 MG tablet clonazepam 1 mg tablet  .  diltiazem (CARDIZEM) 120 MG tablet Take 1 tablet 2 x /day for BP  . famotidine (PEPCID) 20 MG tablet 20 mg daily. As needed.  . fexofenadine (ALLEGRA) 180 MG tablet Take 1 tablet (180 mg total) by mouth daily.  . fluticasone (FLONASE) 50 MCG/ACT nasal spray SHAKE LIQUID AND USE 2 SPRAYS IN EACH NOSTRIL AT BEDTIME FOR FLUID IN EARS  . furosemide (LASIX) 20 MG tablet Take 1 tablet (20 mg total) by mouth daily as needed for edema.  . meclizine (ANTIVERT) 25 MG tablet 1/2-1 pill up to 3 times daily for motion sickness/dizziness  . Mouthwash Compounding Base LIQD Take 5 mLs by mouth every 2 (two) hours as needed.  . potassium chloride SA (K-DUR) 20 MEQ tablet Take 1 tablet 2 x /day for Potassium  . pravastatin (PRAVACHOL) 40 MG tablet Take 1 tablet Daily for Cholesterol  . promethazine-dextromethorphan (PROMETHAZINE-DM) 6.25-15 MG/5ML syrup TAKE 5 ML BY MOUTH FOUR TIMES DAILY AS NEEDED FOR COUGH  . propranolol (INDERAL) 10 MG tablet TAKE 1 TABLET TWICE DAILY FOR ANXIETY  . sertraline (ZOLOFT) 100 MG tablet Take 1 tablet Daily for  Mood   No current facility-administered medications on file prior to visit.    ROS: all negative except above.   Physical Exam:  There were no vitals taken for this visit.  General Appearance: Well nourished, in no apparent distress. Eyes: PERRLA, EOMs, conjunctiva no swelling or erythema Sinuses: No Frontal/maxillary tenderness ENT/Mouth: Ext aud canals clear, TMs without erythema, bulging.  No erythema, swelling, or exudate on post pharynx.  Tonsils not swollen or erythematous. Hearing normal.  Neck: Supple, thyroid normal.  Respiratory: Respiratory effort normal, BS equal bilaterally without rales, rhonchi, wheezing or stridor.  Cardio: RRR with no MRGs. Brisk peripheral pulses without edema.  Abdomen: Soft, + BS.  Non tender, no guarding, rebound, hernias, masses. Lymphatics: Non tender without lymphadenopathy.  Musculoskeletal: Full ROM, 5/5 strength, normal  gait.  Skin: Warm, dry without rashes, lesions, ecchymosis.  Neuro: Cranial nerves intact. Normal muscle tone, no cerebellar symptoms. Sensation intact.  Psych: Awake and oriented X 3, normal affect, Insight and Judgment appropriate.     Garnet Sierras, NP 11:33 PM [facility]

## 2020-06-14 ENCOUNTER — Ambulatory Visit: Payer: PPO | Admitting: Adult Health Nurse Practitioner

## 2020-06-16 ENCOUNTER — Other Ambulatory Visit: Payer: Self-pay

## 2020-06-16 ENCOUNTER — Ambulatory Visit (INDEPENDENT_AMBULATORY_CARE_PROVIDER_SITE_OTHER): Payer: PPO | Admitting: Adult Health

## 2020-06-16 ENCOUNTER — Ambulatory Visit
Admission: RE | Admit: 2020-06-16 | Discharge: 2020-06-16 | Disposition: A | Discharge status: Home / Self Care | Payer: PPO | Source: Ambulatory Visit | Attending: Adult Health | Admitting: Adult Health

## 2020-06-16 ENCOUNTER — Encounter: Payer: Self-pay | Admitting: Adult Health

## 2020-06-16 VITALS — BP 112/74 | HR 74 | Temp 97.5°F | Wt 136.0 lb

## 2020-06-16 DIAGNOSIS — R61 Generalized hyperhidrosis: Secondary | ICD-10-CM

## 2020-06-16 DIAGNOSIS — Z201 Contact with and (suspected) exposure to tuberculosis: Secondary | ICD-10-CM

## 2020-06-16 DIAGNOSIS — R350 Frequency of micturition: Secondary | ICD-10-CM

## 2020-06-16 DIAGNOSIS — H6983 Other specified disorders of Eustachian tube, bilateral: Secondary | ICD-10-CM | POA: Diagnosis not present

## 2020-06-16 DIAGNOSIS — H6504 Acute serous otitis media, recurrent, right ear: Secondary | ICD-10-CM | POA: Diagnosis not present

## 2020-06-16 MED ORDER — FEXOFENADINE HCL 60 MG PO TABS: 60.0000 mg | ORAL_TABLET | Freq: Two times a day (BID) | ORAL | 2 refills | Status: DC

## 2020-06-16 NOTE — Progress Notes (Signed)
Assessment and Plan:  Latoya [name] was seen today for acute visit.  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  -     fexofenadine (ALLEGRA) 60 MG tablet; Take 1 tablet (60 mg total) by mouth 2 (two) times daily.  Further disposition pending results of labs. Discussed med's effects and SE's.   Over 20 minutes of exam, counseling, chart review, and critical decision making was performed.   Future Appointments  Date Time Provider [place]  06/23/2020  9:10 AM [facility] MM 2 [facility]  08/24/2020 11:30 AM McClanahan, Danton Sewer, [name] [facility] None  11/08/2020 10:00 AM [name], [name] [facility] None    ------------------------------------------------------------------------------------------------------------------   HPI BP 112/74   Pulse 74   Temp (!) 97.5 F (36.4 C)   Wt 136 lb (61.7 kg)   SpO2 98%   BMI 24.09 kg/m   69 y.o.female with anxiety, chronic rhinitis, eustachian tube dysfunction, presents for evaluation for possible UTI and intermittent ear pressure.   She reports last week had a day with frequency, excess urination, mild dysuria. She reports this has improved some but still some dysuria, also some lower back aching (unsure if overworked in her garden). Denies urine [employer]. Denies fever/chills.   She has eustachian tube dysfunction; has seen ENT  recently She has been newly taking fexofenadine 180 mg, but admits only 3-4 days out of the week She is also doing flonase 1 spray in each nostril BID, also saline nasal sprays, has improved some but reports intermittent ear pressure and congestion, "swimmy headed." Taking meclizine PRN which does help. Just wants ears checked to "make sure they're ok."   She also mentions new drenching sweats, has most frequently at night, but some during the day and not every night. Ongoing for 1 month. She is chronically fatigued, no changes, denies cough, CP, dyspnea.   She reports her daughter had TB, underwent treatment, all family did routine xrays and reports took oral medication for extended period in 61s.   BMI is Body mass index is 24.09 kg/m.,  Wt Readings from Last 3 Encounters:  06/16/20 136 lb (61.7 kg)  05/25/20 136 lb (61.7 kg)  05/19/20 138 lb (62.6 kg)       Past Medical History:  Diagnosis Date  . Anxiety and depression   . Arthritis   . Colon polyps   . GERD (gastroesophageal reflux disease)   . Hemorrhoids   . Hiatal hernia   . History of kidney stones    passed  . Hyperlipemia   . Mixed hyperlipidemia 11/26/2013  . Panic disorder   . Passive smoke exposure 07/17/2018  . Renal insufficiency    stage 3 kidney disease per her PCP  . Stomach ulcer   . Unspecified essential hypertension 11/26/2013     Allergies  Allergen Reactions  . Other  All pain medications: Unknown/ CODEINE  . Prednisone Other (See Comments)    DYSPHORIA  . Vioxx [Rofecoxib]     unknown  . Entex T [Pseudoephedrine-Guaifenesin] Palpitations  . Levaquin [Levofloxacin] Rash    Tolerated Avelox without adverse effect 11/08/15.  . Sulfa Antibiotics Rash  . Trovan [Alatrofloxacin] Rash    Current Outpatient Medications on File Prior to Visit  Medication Sig  . alendronate (FOSAMAX) 70 MG tablet Take 1 tablet  1 x /week on an Empty Stomach for 30 minutes for Osteoporosis  . aspirin 81 MG tablet  Take 81 mg by mouth at bedtime.   . cholecalciferol (VITAMIN D) 1000 units tablet Take 10,000 Units by mouth daily.  . clonazePAM (KLONOPIN) 1 MG tablet clonazepam 1 mg tablet  . diltiazem (CARDIZEM) 120 MG tablet Take 1 tablet 2 x /day for BP  . famotidine (PEPCID) 20 MG tablet 20 mg daily. As needed.  . fexofenadine (ALLEGRA) 180 MG tablet Take 1 tablet (180 mg total) by mouth daily.  . fluticasone (FLONASE) 50 MCG/ACT nasal spray SHAKE LIQUID AND USE 2 SPRAYS IN EACH NOSTRIL AT BEDTIME FOR FLUID IN EARS  . furosemide (LASIX) 20 MG tablet Take 1 tablet (20 mg total) by mouth daily as needed for edema.  . meclizine (ANTIVERT) 25 MG tablet 1/2-1 pill up to 3 times daily for motion sickness/dizziness  . Mouthwash Compounding Base LIQD Take 5 mLs by mouth every 2 (two) hours as needed.  . potassium chloride SA (K-DUR) 20 MEQ tablet Take 1 tablet 2 x /day for Potassium  . pravastatin (PRAVACHOL) 40 MG tablet Take 1 tablet Daily for Cholesterol  . promethazine-dextromethorphan (PROMETHAZINE-DM) 6.25-15 MG/5ML syrup TAKE 5 ML BY MOUTH FOUR TIMES DAILY AS NEEDED FOR COUGH  . propranolol (INDERAL) 10 MG tablet TAKE 1 TABLET TWICE DAILY FOR ANXIETY  . sertraline (ZOLOFT) 100 MG tablet Take 1 tablet Daily for  Mood   No current facility-administered medications on file prior to visit.    Allergies:  Allergies  Allergen Reactions  . Other     All pain medications: Unknown/ CODEINE  . Prednisone Other (See Comments)    DYSPHORIA  . Vioxx [Rofecoxib]     unknown  . Entex T [Pseudoephedrine-Guaifenesin] Palpitations  . Levaquin [Levofloxacin] Rash    Tolerated Avelox without adverse effect 11/08/15.  . Sulfa Antibiotics Rash  . 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 Surgical History:  She  has a past surgical history that includes Abdominal hysterectomy; Tubal ligation; Foot surgery (Left, 2008); Colonoscopy; Upper gastrointestinal endoscopy; Oophorectomy (Right); Carpometacarpel [facility]) suspension plasty (Right, 12/08/2013); I & D extremity (Right, 01/20/2017); Hand surgery (Right); and Cataract extraction (Right, 2020). Family History:  Herfamily history includes Asthma in her mother; CVA in her brother; Dementia in her brother; Heart Problems in her maternal grandfather; Heart attack in her brother; Heart disease in her brother and father; Other in her brother; Pancreatic cancer in her brother; Tuberculosis in her daughter. Social History:   reports that she is a non-smoker but has been exposed to tobacco smoke. She has  never used smokeless tobacco. She reports that she does not drink alcohol and does not use drugs.   ROS: all negative except above.   Physical Exam:  BP 112/74   Pulse 74   Temp (!) 97.5 F (36.4 C)   Wt 136 lb (61.7 kg)   SpO2 98%   BMI 24.09 kg/m   General Appearance: Well nourished, in no apparent distress. Eyes: PERRLA, EOMs, conjunctiva no swelling or erythema Sinuses: No Frontal/maxillary tenderness ENT/Mouth: Ext aud canals clear, TMs without erythema, bulging. No erythema, swelling, or exudate on post pharynx.  Tonsils not swollen or erythematous. Hearing normal.  Neck: Supple, thyroid normal.  Respiratory: Respiratory effort normal, BS equal bilaterally without rales, rhonchi, wheezing or stridor.  Cardio: RRR with no MRGs. Brisk peripheral pulses without edema.  Abdomen: Soft, + BS.  Non  tender, no guarding, rebound, hernias, masses. Lymphatics: Non tender without lymphadenopathy.  Musculoskeletal: Full ROM, 5/5 strength, normal gait.  Skin: Warm, dry without rashes, lesions, ecchymosis.  Neuro: Cranial nerves intact. Normal muscle tone, no cerebellar symptoms. Sensation intact.  Psych: Awake and oriented X 3, normal affect, Insight and Judgment appropriate.     Izora Ribas, [name] 11:59 AM [facility]

## 2020-06-16 NOTE — Patient Instructions (Signed)
Tuberculosis Tuberculosis (TB) is an infection that usually affects the lungs but can affect other parts of the body. It is caused by bacteria. There are two forms of TB:  Active TB, also called TB disease. This means that you have TB symptoms and your infection can spread to another person (you are contagious).  Latent TB. This means that you do not have any symptoms of TB and you are not contagious. Latent TB can turn into active TB, so it is important to get treatment no matter which form of TB you have. What are the causes? TB is caused by bacteria called Mycobacterium tuberculosis. You can catch the bacteria by breathing in droplets from a cough or sneeze from someone who has active TB. What increases the risk? You are more likely to develop TB if you:  Have HIV (human immunodeficiency virus) or AIDS (acquired immunodeficiency syndrome).  Have diabetes (diabetes mellitus).  Are older. Infants are also more likely to get TB.  Use illegal drugs.  Use tobacco.  Live in or travel to areas that have high rates of TB, such as: ? [place]. ? [place]. ? [place]. ? [place]. ? [place]. ? [place]. ? [place].  Live or work in areas of overcrowding or poor airflow (ventilation), including: ? Health care facilities. ? Residential care facilities, such as an assisted living facility. ? Refugee camps or shelters for people who are experiencing homelessness. What are the signs or symptoms? Symptoms of this condition include:  Coughing up blood, mucus from the lungs (sputum), or both.  A cough that lasts three weeks or longer.  Chest pain, or pain while breathing or coughing.  Unexplained weight loss.  Fatigue and weakness.  Fever.  Sweating.  Chills.  Loss of appetite. How is this diagnosed? This condition may be diagnosed based on a physical exam, your symptoms, and your medical history. You may have chest X-rays done. You may also have one or more of the following  samples taken and tested for bacteria:  Skin.  Blood.  Sputum.  Urine. How is this treated? Both latent and active TB are treated with antibiotic medicine. You may need to take antibiotics for up to 6-9 months. Follow these instructions at home:     Medicines  Take over-the-counter and prescription medicines only as told by your health care provider.  Take your antibiotic medicine as told by your health care provider. Do not stop taking the antibiotic even if you start to feel better. Activity  Rest as needed. Ask your health care provider what activities are safe for you.  Do not go back to work or school until your health care provider approves.  Avoid close contact with others (especially infants and older people) until your health care provider says that you are no longer contagious. General instructions  Tell your health care provider about all the people you live with or have close contact with. Those people may need to be tested for TB.  Do not use any products that contain nicotine or tobacco, such as cigarettes and e-cigarettes. If you need help quitting, ask your health care provider.  Cover your mouth and nose when you cough or sneeze. Dispose of used tissues as directed by your health care provider.  Wash your hands often with soap and water. If soap and water are not available, use hand sanitizer.  Keep all follow-up visits as told by your health care provider. This is important. Contact a health care provider if:  You have   new symptoms.  You lose your appetite.  You feel nauseous or you vomit.  Your urine is dark yellow.  Your skin or the white part of your eyes turns a yellowish color (jaundice).  Your symptoms get worse or do not go away with treatment.  You have a fever. Get help right away if:  You have chest pain.  You cough up blood.  You have trouble breathing or feel short of breath.  You have a headache or neck  stiffness. Summary  Tuberculosis (TB) is an infection that usually affects the lungs but can affect other parts of the body. It is caused by bacteria.  The two forms of TB are active TB and latent TB. If you have active TB, your infection can spread to another person (you are contagious).  Latent TB can turn into active TB, so it is important to get treatment no matter which form of TB you have.  TB is treated with antibiotic medicine. You may need to take antibiotics for up to 6-9 months. This information is not intended to replace advice given to you by your health care provider. Make sure you discuss any questions you have with your health care provider. Document Revised: 11/20/2017 Document Reviewed: 03/09/2017 [employer] Patient Education  2020 [employer]  

## 2020-06-18 LAB — CBC WITH DIFFERENTIAL/PLATELET
Absolute Monocytes: 539 cells/uL (ref 200–950)
Basophils Absolute: 41 cells/uL (ref 0–200)
Basophils Relative: 0.7 %
Eosinophils Absolute: 110 cells/uL (ref 15–500)
Eosinophils Relative: 1.9 %
HCT: 41.3 % (ref 35.0–45.0)
Hemoglobin: 13.7 g/dL (ref 11.7–15.5)
Lymphs Abs: 1583 cells/uL (ref 850–3900)
MCH: 30.6 pg (ref 27.0–33.0)
MCHC: 33.2 g/dL (ref 32.0–36.0)
MCV: 92.4 fL (ref 80.0–100.0)
MPV: 9.7 fL (ref 7.5–12.5)
Monocytes Relative: 9.3 %
Neutro Abs: 3526 cells/uL (ref 1500–7800)
Neutrophils Relative %: 60.8 %
Platelets: 190 10*3/uL (ref 140–400)
RBC: 4.47 10*6/uL (ref 3.80–5.10)
RDW: 13.1 % (ref 11.0–15.0)
Total Lymphocyte: 27.3 %
WBC: 5.8 10*3/uL (ref 3.8–10.8)

## 2020-06-18 LAB — URINALYSIS W MICROSCOPIC + REFLEX CULTURE
Bilirubin Urine: NEGATIVE
Glucose, UA: NEGATIVE
Hgb urine dipstick: NEGATIVE
Leukocyte Esterase: NEGATIVE
Nitrites, Initial: NEGATIVE
Specific Gravity, Urine: 1.03 (ref 1.001–1.03)
WBC, UA: NONE SEEN /HPF (ref 0–5)
pH: 6 (ref 5.0–8.0)

## 2020-06-18 LAB — QUANTIFERON-TB GOLD PLUS
Mitogen-NIL: >10 IU/mL
NIL: 0.03 IU/mL
QuantiFERON-TB Gold Plus: POSITIVE — AB
TB1-NIL: 0.36 IU/mL
TB2-NIL: 0.34 IU/mL

## 2020-06-18 LAB — NO CULTURE INDICATED

## 2020-06-21 ENCOUNTER — Other Ambulatory Visit: Payer: Self-pay | Admitting: Adult Health

## 2020-06-21 DIAGNOSIS — Z227 Latent tuberculosis: Secondary | ICD-10-CM | POA: Insufficient documentation

## 2020-06-21 DIAGNOSIS — Z201 Contact with and (suspected) exposure to tuberculosis: Secondary | ICD-10-CM

## 2020-06-21 DIAGNOSIS — R7612 Nonspecific reaction to cell mediated immunity measurement of gamma interferon antigen response without active tuberculosis: Secondary | ICD-10-CM

## 2020-06-21 DIAGNOSIS — R61 Generalized hyperhidrosis: Secondary | ICD-10-CM

## 2020-06-23 ENCOUNTER — Ambulatory Visit
Admission: RE | Admit: 2020-06-23 | Discharge: 2020-06-23 | Disposition: A | Discharge status: Home / Self Care | Payer: PPO | Source: Ambulatory Visit | Attending: Internal Medicine | Admitting: Internal Medicine

## 2020-06-23 ENCOUNTER — Other Ambulatory Visit: Payer: Self-pay

## 2020-06-23 DIAGNOSIS — Z1231 Encounter for screening mammogram for malignant neoplasm of breast: Secondary | ICD-10-CM | POA: Diagnosis not present

## 2020-06-30 ENCOUNTER — Encounter: Payer: Self-pay | Admitting: Internal Medicine

## 2020-06-30 ENCOUNTER — Ambulatory Visit: Payer: PPO | Admitting: Infectious Disease

## 2020-06-30 ENCOUNTER — Ambulatory Visit (INDEPENDENT_AMBULATORY_CARE_PROVIDER_SITE_OTHER): Payer: PPO | Admitting: Internal Medicine

## 2020-06-30 ENCOUNTER — Other Ambulatory Visit: Payer: Self-pay

## 2020-06-30 VITALS — BP 138/79 | HR 71 | Temp 98.6°F | Wt 137.0 lb

## 2020-06-30 DIAGNOSIS — R232 Flushing: Secondary | ICD-10-CM

## 2020-06-30 DIAGNOSIS — Z227 Latent tuberculosis: Secondary | ICD-10-CM

## 2020-06-30 NOTE — Progress Notes (Signed)
[facility]  Reason for Consult:positive quantiferon GOLD Referring Provider: Unk Pinto, MD      Patient Active Problem List   Diagnosis Date Noted  . 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 H/O exposure to tuberculosis 06/16/2020  . Recurrent acute serous otitis media of right ear 05/19/2020  . History of eustachian tube dysfunction 05/19/2020  . Osteoporosis 06/23/2019  . FHx: heart disease 11/27/2017  . 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 Laryngopharyngeal reflux (LPR) 10/12/2016  . Chronic rhinitis 10/09/2016  . Hiatal hernia 07/14/2016  . Aortic atherosclerosis ([place]) 06/30/2016  . CKD (chronic kidney disease) stage 3, GFR 30-59 ml/min (HCC) 06/30/2016  . Thyroid nodule 12/24/2015  . Abnormal glucose 05/01/2014  . Vitamin D deficiency 05/01/2014  . Medication management 05/01/2014  . Essential hypertension 11/26/2013  . Hyperlipidemia, mixed 11/26/2013  . Chronic cough 09/19/2013  . Generalized anxiety disorder 07/11/2013  . Esophageal reflux 11/29/2012    Patient's Medications  New Prescriptions   No medications on file  Previous Medications   ALENDRONATE (FOSAMAX) 70 MG TABLET    Take 1 tablet  1 x /week on an Empty Stomach for 30 minutes for Osteoporosis   ASPIRIN 81 MG TABLET    Take 81 mg by mouth at bedtime.    CHOLECALCIFEROL (VITAMIN D) 1000 UNITS TABLET    Take 10,000 Units by mouth daily.   CLONAZEPAM (KLONOPIN) 1 MG TABLET    clonazepam 1 mg tablet   DILTIAZEM (CARDIZEM) 120 MG TABLET    Take 1 tablet 2 x /day for BP   FAMOTIDINE (PEPCID) 20 MG TABLET    20 mg daily. As needed.   FEXOFENADINE (ALLEGRA) 60 MG TABLET    Take 1 tablet (60 mg total) by mouth 2 (two) times daily.   FLUTICASONE (FLONASE) 50 MCG/ACT NASAL SPRAY    SHAKE LIQUID AND USE 2 SPRAYS IN EACH NOSTRIL AT BEDTIME FOR FLUID IN EARS   FUROSEMIDE (LASIX) 20 MG TABLET    Take 1 tablet (20 mg total) by mouth daily as needed for  edema.   MECLIZINE (ANTIVERT) 25 MG TABLET    1/2-1 pill up to 3 times daily for motion sickness/dizziness   MOUTHWASH COMPOUNDING BASE LIQD    Take 5 mLs by mouth every 2 (two) hours as needed.   POTASSIUM CHLORIDE SA (K-DUR) 20 MEQ TABLET    Take 1 tablet 2 x /day for Potassium   PRAVASTATIN (PRAVACHOL) 40 MG TABLET    Take 1 tablet Daily for Cholesterol   PROMETHAZINE-DEXTROMETHORPHAN (PROMETHAZINE-DM) 6.25-15 MG/5ML SYRUP    TAKE 5 ML BY MOUTH FOUR TIMES DAILY AS NEEDED FOR COUGH   PROPRANOLOL (INDERAL) 10 MG TABLET    TAKE 1 TABLET TWICE DAILY FOR ANXIETY   SERTRALINE (ZOLOFT) 100 MG TABLET    Take 1 tablet Daily for  Mood  Modified Medications   No medications on file  Discontinued Medications   No medications on file    HPI: Latoya [name] is a 69 y.o. female osteoporosis, anxiety/depression/hlp, ckd3, gerd/PUD referred by pcp for a positive quantiferon gold   Patient has been complaining of being tired and has been worked up as such with a positive quantiferon gold. She has chest xray 10/13 which showed non acute pathology or evidence of upper lobe scarring on my review  She reports not sure why this was tested. She takes care of her copd husband at home and does a lot  of chores/errand and feel tired late in the evening. She does have lots of energy in the early part of the day.   She said she feels like this for a "long time."  pcp's report mention a few months of night sweat. On clarifying with her she does reports once very 2 weeks does have warm sweat. It occurs through out the day; starts in her head/forehead then spread down to her body. Many times feels like she has to change clothing. No fever chill.   No weight loss, decreased appetite (actually endorsed to eating well), rash, joint pain, muscle ache/weakness, n/v/diarrhea, dysuria, bloody urine  Denies extremities burning sensation when taking warm shower  She bruised her upper extremities easily. Takes a baby  aspirin  She is very active; and able to keep up same activity level otherwise. She feels like =she is 100% of baseline health  Other ID epi: utd with her age appropriate cancer screening (all normal mamm, pap, colonoscopy) No hx skin cancer; no family hx cancer On prior animal exposure (living near farm or such) No smoking hx; drugs use No unpasteurized dairy products Works in the yard frequently Born/raised in [place]; never traveled out of state   Mother was diagnosed with TB. Patient reports after that she and her oldest daughter were screened (oldest daughter was an toddler at the time) and were positive. This was roughly 1970s. Both patient and her daughter were placed on 1 pill daily for several months.      Review of Systems: ROS Negative 11 point ros unless mentioned above   Past Medical History:  Diagnosis Date  . Anxiety and depression   . Arthritis   . Colon polyps   . GERD (gastroesophageal reflux disease)   . Hemorrhoids   . Hiatal hernia   . History of kidney stones    passed  . Hyperlipemia   . Mixed hyperlipidemia 11/26/2013  . Panic disorder   . Passive smoke exposure 07/17/2018  . Renal insufficiency    stage 3 kidney disease per her PCP  . Stomach ulcer   . Unspecified essential hypertension 11/26/2013    Social History   Tobacco Use  . Smoking status: Passive Smoke Exposure - Never Smoker  . Smokeless tobacco: Never Used  . Tobacco comment: Husband & Father smoked  Vaping Use  . Vaping Use: Never used  Substance Use Topics  . Alcohol use: No    Alcohol/week: 0.0 standard drinks  . Drug use: No    Family History  Problem Relation Age of Onset  . Heart disease Brother        x2  . Pancreatic cancer Brother   . Dementia Brother   . Heart attack Brother        40s-50s, was on drugs  . Heart disease Father   . Asthma Mother   . Heart Problems Maternal Grandfather   . CVA Brother   . Other Brother        "lots of health problems" -  unsure specifics  . Tuberculosis Daughter   . Colon cancer Neg Hx   . Stomach cancer Neg Hx   . Breast cancer Neg Hx    Allergies  Allergen Reactions  . Other     All pain medications: Unknown/ CODEINE  . Prednisone Other (See Comments)    DYSPHORIA  . Vioxx [Rofecoxib]     unknown  . Entex T [Pseudoephedrine-Guaifenesin] Palpitations  . Levaquin [Levofloxacin] Rash    Tolerated Avelox without  adverse effect 11/08/15.  . Sulfa Antibiotics Rash  . Trovan [Alatrofloxacin] Rash    OBJECTIVE: Vitals:   06/30/20 1344  BP: 138/79  Pulse: 71  Temp: 98.6 F (37 C)  TempSrc: Oral  Weight: 137 lb (62.1 kg)   Body mass index is 24.27 kg/m.   Physical Exam Well appearing, no distress, fully conversant Normocephalic; per; conj clear; eomi Neck supple Lymph: no cervical/inguinal/cervical LAD cv rrr no mrg Lungs clear, normal respiratory effort abd s/nt Ext no edema Skin no rash Neuro strength, gait, reflex normal; cn2-12 function intact, no tremor  Psych alert/oriented   Lab:  Microbiology: No results found for this or any previous visit (from the past 240 hour(s)).  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 While the chance does'nt become 0% for developing active tb in latent tb tx, she is rather low risk. And there is no data to treat again for her epidemiology group  I am not sure what to make of her sweat. Doesn't appear to be night sweat. She appears to be at baseline health without b sx. She is utd with her age appropriate cancer screening. Her tsh is normal.   Doesn't appear to have recurrent headache or other sx to suggest pheo.    At this time I would advise continuing monitoring for sx/s esepcially b-sx     -no evidence active tb. Prior treated  latent tb. No need to repeat any TB screen test (ppd, igra) -monitor for b sx or f/c and can reengage [place] as needed  Discharge from clinic for now.   Patient Active Problem List   Diagnosis Date Noted  . 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 H/O exposure to tuberculosis 06/16/2020  . Recurrent acute serous otitis media of right ear 05/19/2020  . History of eustachian tube dysfunction 05/19/2020  . Osteoporosis 06/23/2019  . FHx: heart disease 11/27/2017  . 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 Laryngopharyngeal reflux (LPR) 10/12/2016  . Chronic rhinitis 10/09/2016  . Hiatal hernia 07/14/2016  . Aortic atherosclerosis ([place]) 06/30/2016  . CKD (chronic kidney disease) stage 3, GFR 30-59 ml/min (HCC) 06/30/2016  . Thyroid nodule 12/24/2015  . Abnormal glucose 05/01/2014  . Vitamin D deficiency 05/01/2014  . Medication management 05/01/2014  . Essential hypertension 11/26/2013  . Hyperlipidemia, mixed 11/26/2013  . Chronic cough 09/19/2013  . Generalized anxiety disorder 07/11/2013  . Esophageal reflux 11/29/2012     Problem List Items Addressed This Visit    None       I am having Mechele Claude maintain her aspirin, cholecalciferol, furosemide, famotidine, clonazePAM, potassium chloride SA, sertraline, alendronate, promethazine-dextromethorphan, pravastatin, diltiazem, fluticasone, meclizine, propranolol, Mouthwash Compounding Base, and fexofenadine.   No orders of the defined types were placed in this encounter.    Follow-up: No follow-ups on file.  Jabier Mutton, [facility] [place] -- -- pager   [phone] cell 06/30/2020, 2:00 PM

## 2020-06-30 NOTE — Patient Instructions (Signed)
You do not have evidence of active Tuberculosis. You had exposure and receive medication to get rid of hidden bacteria   Continue monitoring for other abnormal signs such as weight loss, fever, chill, decreased appetite, cough, or focal body sx and discuss with your regular doctor as needed  We do not need to get any diagnostic testings at this time  Thank you for using our service

## 2020-07-01 ENCOUNTER — Other Ambulatory Visit: Payer: Self-pay | Admitting: Internal Medicine

## 2020-07-01 MED ORDER — PRAVASTATIN SODIUM 40 MG PO TABS
ORAL_TABLET | ORAL | 0 refills | Status: DC
Start: 2020-07-01 — End: 2020-08-21

## 2020-07-22 ENCOUNTER — Other Ambulatory Visit: Payer: Self-pay

## 2020-07-22 ENCOUNTER — Telehealth: Payer: Self-pay

## 2020-07-22 NOTE — Telephone Encounter (Signed)
Patient has been feeling like this for over a week. Sounds congested. Advised her to take an antihistamine and nasal sprays. Is planning on coming in to be seen.

## 2020-07-22 NOTE — Telephone Encounter (Signed)
Sinus pressure in her head and ears. Taking mucinex but nothing is working. Please advise.

## 2020-07-22 NOTE — Progress Notes (Signed)
   History of Present Illness:      Patient is a 69 yo MWF with HTN, HLD, Pre-Diabetes and Vitamin D Deficiency  presenting with HA, cough, abdominal pain & fatigue.   Medications  .  diltiazem (CARDIZEM) 120 MG tablet, Take 1 tablet 2 x /day for BP .  furosemide (LASIX) 20 MG tablet, Take 1 tablet (20 mg total) by mouth daily as needed for edema. .  pravastatin (PRAVACHOL) 40 MG tablet, Take       1 tablet       Daily        for Cholesterol .  propranolol (INDERAL) 10 MG tablet, TAKE 1 TABLET TWICE DAILY FOR ANXIETY  .  promethazine-dextromethorphan (PROMETHAZINE-DM) 6.25-15 MG/5ML syrup, TAKE 5 ML BY MOUTH FOUR TIMES DAILY AS NEEDED FOR COUGH .  aspirin 81 MG tablet, Take 81 mg by mouth at bedtime.  .  cholecalciferol (VITAMIN D) 1000 units tablet, Take 10,000 Units by mouth daily. .  clonazePAM (KLONOPIN) 1 MG tablet, clonazepam 1 mg tablet .  famotidine (PEPCID) 20 MG tablet, 20 mg daily. As needed. .  meclizine (ANTIVERT) 25 MG tablet, 1/2-1 pill up to 3 times daily for motion sickness/dizziness .  Mouthwash Compounding Base LIQD, Take 5 mLs by mouth every 2 (two) hours as needed. .  potassium chloride SA (K-DUR) 20 MEQ tablet, Take 1 tablet 2 x /day for Potassium .  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   Observations/Objective:   There were no  vitals taken for this visit.  HEENT - WNL. Neck - supple.  Chest - Clear equal BS. Cor - Nl HS. RRR w/o sig MGR. PP 1(+). No edema. MS- FROM w/o deformities.  Gait Nl. Neuro -  Nl w/o focal abnormalities.  Assessment and Plan:  1. Subacute pansinusitis  - dexamethasone  4 MG tablet; Take 1 tab 3 x day - 3 days, then 2 x day - 3 days, then 1 tab daily  Dispense: 20 tablet  - azithromycin 250 MG tablet; Take 2 tablets with Food on  Day 1, then 1 tablet Daily with Food for Infection  Dispense: 6 each; Refill: 1   2. Encounter for screening for COVID-19  - POC COVID-19 BinaxNow   Follow Up Instructions:     I discussed the assessment and treatment plan with the patient. The patient was provided an opportunity to ask questions and all were answered. The patient agreed with the plan and demonstrated an understanding of the instructions.      The patient was advised to call back or seek an in-person evaluation if the symptoms worsen or if the condition fails to improve as anticipated.   Kirtland Bouchard, MD

## 2020-07-23 ENCOUNTER — Other Ambulatory Visit: Payer: Self-pay

## 2020-07-23 ENCOUNTER — Encounter: Payer: Self-pay | Admitting: Internal Medicine

## 2020-07-23 ENCOUNTER — Ambulatory Visit (INDEPENDENT_AMBULATORY_CARE_PROVIDER_SITE_OTHER): Payer: PPO | Admitting: Internal Medicine

## 2020-07-23 VITALS — BP 144/86 | HR 63 | Temp 97.3°F | Resp 12 | Ht 63.0 in | Wt 132.0 lb

## 2020-07-23 DIAGNOSIS — J0140 Acute pansinusitis, unspecified: Secondary | ICD-10-CM | POA: Diagnosis not present

## 2020-07-23 DIAGNOSIS — Z1152 Encounter for screening for COVID-19: Secondary | ICD-10-CM

## 2020-07-23 LAB — POC COVID19 BINAXNOW: SARS Coronavirus 2 Ag: NEGATIVE

## 2020-07-23 MED ORDER — DEXAMETHASONE 4 MG PO TABS: ORAL_TABLET | ORAL | 0 refills | Status: DC

## 2020-07-23 MED ORDER — AZITHROMYCIN 250 MG PO TABS: ORAL_TABLET | ORAL | 1 refills | Status: DC

## 2020-07-24 ENCOUNTER — Encounter: Payer: Self-pay | Admitting: Internal Medicine

## 2020-07-27 ENCOUNTER — Telehealth: Payer: Self-pay | Admitting: *Deleted

## 2020-07-27 NOTE — Telephone Encounter (Signed)
Returned call to patient regarding Dexamethasone problem. She states her BP has been up and down,with a headache since on the medication. She also complained of shakiness and nervous feeling. She will continue her Z-pak and stop the Dexamethasone, since she is feeling better. Dr Melford Aase is aware.

## 2020-08-09 ENCOUNTER — Other Ambulatory Visit: Payer: Self-pay | Admitting: Internal Medicine

## 2020-08-09 DIAGNOSIS — J0140 Acute pansinusitis, unspecified: Secondary | ICD-10-CM

## 2020-08-09 MED ORDER — DEXAMETHASONE 4 MG PO TABS: ORAL_TABLET | ORAL | 0 refills | Status: DC

## 2020-08-21 ENCOUNTER — Other Ambulatory Visit: Payer: Self-pay | Admitting: Internal Medicine

## 2020-08-21 DIAGNOSIS — I10 Essential (primary) hypertension: Secondary | ICD-10-CM

## 2020-08-23 ENCOUNTER — Telehealth: Payer: Self-pay | Admitting: *Deleted

## 2020-08-23 ENCOUNTER — Other Ambulatory Visit: Payer: Self-pay | Admitting: Internal Medicine

## 2020-08-23 MED ORDER — ALPRAZOLAM 0.5 MG PO TABS: ORAL_TABLET | ORAL | 0 refills | Status: DC

## 2020-08-23 NOTE — Telephone Encounter (Signed)
Left message for patient. Patient called and reported spouse passed away on 23-Aug-2020. She requested RX for anxiety. Informed patient RX for Alprazolam 0.5 mg sent to her pharmacy and advised do not Clonazepam with the Alprazolam, per Dr Melford Aase.

## 2020-08-23 NOTE — Progress Notes (Deleted)
3 MONTH FOLLOW UP  Assessment and Plan:     Essential hypertension - continue medications, DASH diet, exercise and monitor at home. Call if greater than 130/80.  -     CBC with Differential/Platelet -     CMP/GFR -     TSH  Mixed hyperlipidemia -continue medications, check lipids, decrease fatty foods, increase activity.  -     Lipid panel  Aortic atherosclerosis ([place]) Noted on CT 2016 Control blood pressure, cholesterol, glucose, increase exercise.   Multiple lentigines syndrome (HCC) Continue to monitor  CKD (chronic kidney disease) stage 3, GFR 30-59 ml/min Increase fluids, avoid NSAIDS, monitor sugars, will monitor  Other abnormal glucose Discussed general issues about diabetes pathophysiology and management., Educational material distributed., Suggested low cholesterol diet., Encouraged aerobic exercise., Discussed foot care., Reminded to get yearly retinal exam.  Hiatal hernia Continue GERD medication, follow up GI as needed Avoid lying down after meals   Gastroesophageal reflux disease, esophagitis presence not specified/LPR Continue H2 blocker, Add diet discussed  Generalized anxiety disorder Increase zoloft to 150 mg daily, USE THE KLONOPIN IF NEEDED *** stress management techniques discussed, increase water, good sleep hygiene discussed, increase exercise, and increase veggies.   Vitamin D deficiency At goal at recent check; continue to recommend supplementation for goal of 70-100 Defer vitamin D level  Chronic cough syndrome Continue GERD meds, follow up Dr. Melvyn Novas as needed.   Medication management -     Magnesium   Over 30 minutes of face to face exam, counseling, chart review and critical decision making was performed Future Appointments  Date Time Provider [place]  08/24/2020  3:30 PM [name], NP [facility] None  11/08/2020 10:00 AM [name], NP [facility] None    Subjective:  Latoya [name] is a 69 y.o. female who  presents for 3 month follow up for htn, hyperlipidemia, glucose management, depression/anxiety, weight and vitamin D.   Husband passed ****  She has GERD/LPR, had EGD 10/2017, on pepcid 40 mg daily per Dr. Carlean Purl; she also has dx of chronic cough syndrome per Dr. Melvyn Novas after extensive workup for persistent cough. She reports some epigastric discomfort, cough has resolved. Denies any other sx, fever/chills, N/V/D, reflux, burning, hematochezia, melena.   she has a diagnosis of depression/anxiety and is currently on zoloft 100 mg daily, klonopin 0.5 mg PRN, reports symptoms are not well controlled on current regimen, under a lot of stress r/t her husbands heath and her daughter who is living with them. she currently reports using rarely, cuts in 1/4s and takes no more than 1-2 tabs per month.   BMI is There is no height or weight on file to calculate BMI., she has been working on diet and exercise. Wt Readings from Last 3 Encounters:  07/23/20 132 lb (59.9 kg)  06/30/20 137 lb (62.1 kg)  06/16/20 136 lb (61.7 kg)   Her blood pressure has been controlled at home, today their BP is    She does workout, walking, yard work, house work. She denies chest pain, shortness of breath, dizziness.   She is on cholesterol medication (pravastatin 40 mg daily) and denies myalgias. Her LDL cholesterol is at goal. The cholesterol last visit was:   Lab Results  Component Value Date   CHOL 185 05/25/2020   HDL 52 05/25/2020   LDLCALC 99 05/25/2020   TRIG 219 (H) 05/25/2020   CHOLHDL 3.6 05/25/2020    She has been working on diet and exercise for glucose management, and denies  paresthesia of the feet, polydipsia, polyuria and visual disturbances. Last A1C in the office was:  Lab Results  Component Value Date   HGBA1C 5.5 05/25/2020   Last GFR: Lab Results  Component Value Date   GFRNONAA 41 (L) 05/19/2020   GFRNONAA 54 (L) 01/29/2020   GFRNONAA 52 (L) 11/06/2019   Patient is on Vitamin D supplement  for deficency.   Lab Results  Component Value Date   VD25OH 66 05/25/2020        Medication Review: Current Outpatient Medications on File Prior to Visit  Medication Sig  . ALPRAZolam (XANAX) 0.5 MG tablet Take     1/2  to 1 tablet        2 to 3 x /day         as needed for Nerves  . aspirin 81 MG tablet Take 81 mg by mouth at bedtime.   . cholecalciferol (VITAMIN D) 1000 units tablet Take 10,000 Units by mouth daily.  Marland Kitchen dexamethasone (DECADRON) 4 MG tablet Take 1 tab 3 x day - 3 days, then 2 x day - 3 days, then 1 tab daily  . diltiazem (CARDIZEM) 120 MG tablet Take     1 tablet     2 x /day (every 12 hours)       for BP                  TAKE 1 TABLET BY MOUTH TWICE DAILY FOR BLOOD PRESSURE  . famotidine (PEPCID) 20 MG tablet 20 mg daily. As needed.  . furosemide (LASIX) 20 MG tablet Take 1 tablet (20 mg total) by mouth daily as needed for edema.  . meclizine (ANTIVERT) 25 MG tablet 1/2-1 pill up to 3 times daily for motion sickness/dizziness  . Mouthwash Compounding Base LIQD Take 5 mLs by mouth every 2 (two) hours as needed.  . potassium chloride SA (K-DUR) 20 MEQ tablet Take 1 tablet 2 x /day for Potassium  . pravastatin (PRAVACHOL) 40 MG tablet Take      1 tablet        at Bedtime        for Cholesterol                 TAKE 1 TABLET BY MOUTH DAILY FOR CHOLESTEROL  . propranolol (INDERAL) 10 MG tablet TAKE 1 TABLET TWICE DAILY FOR ANXIETY  . sertraline (ZOLOFT) 100 MG tablet Take     1 tablet     Daily       for Mood         TAKE 1 TABLET BY MOUTH DAILY FOR MOOD   No current facility-administered medications on file prior to visit.     Allergies  Allergen Reactions  . Other     All pain medications: Unknown/ CODEINE  . Prednisone Other (See Comments)    DYSPHORIA  . Vioxx [Rofecoxib]     unknown  . Entex T [Pseudoephedrine-Guaifenesin] Palpitations  . Levaquin [Levofloxacin] Rash    Tolerated Avelox without adverse effect 11/08/15.  . Sulfa Antibiotics Rash  . Trovan  [Alatrofloxacin] Rash    Current Problems (verified) Patient Active Problem List   Diagnosis Date Noted  . 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 H/O exposure to tuberculosis 06/16/2020  . Recurrent acute serous otitis media of right ear 05/19/2020  . History of eustachian tube dysfunction 05/19/2020  . Osteoporosis 06/23/2019  . FHx: heart disease 11/27/2017  . Multiple lentigines syndrome ([place]) 06/20/2017  . Laryngopharyngeal reflux (  LPR) 10/12/2016  . Chronic rhinitis 10/09/2016  . Hiatal hernia 07/14/2016  . Aortic atherosclerosis ([place]) 06/30/2016  . CKD (chronic kidney disease) stage 3, GFR 30-59 ml/min (HCC) 06/30/2016  . Thyroid nodule 12/24/2015  . Abnormal glucose 05/01/2014  . Vitamin D deficiency 05/01/2014  . Medication management 05/01/2014  . Essential hypertension 11/26/2013  . Hyperlipidemia, mixed 11/26/2013  . Chronic cough 09/19/2013  . Generalized anxiety disorder 07/11/2013  . Esophageal reflux 11/29/2012   SURGICAL HISTORY She  has a past surgical history that includes Abdominal hysterectomy; Tubal ligation; Foot surgery (Left, 2008); Colonoscopy; Upper gastrointestinal endoscopy; Oophorectomy (Right); Carpometacarpel [facility]) suspension plasty (Right, 12/08/2013); I & D extremity (Right, 01/20/2017); Hand surgery (Right); and Cataract extraction (Right, 2020). FAMILY HISTORY Her family history includes Asthma in her mother; CVA in her brother; Dementia in her brother; Heart Problems in her maternal grandfather; Heart attack in her brother; Heart disease in her brother and father; Other in her brother; Pancreatic cancer in her brother; Tuberculosis in her daughter. SOCIAL HISTORY She  reports that she is a non-smoker but has been exposed to tobacco smoke. She has never used smokeless tobacco. She reports that she does not drink alcohol and does not use drugs.  ***  Review of Systems  Constitutional: Negative for malaise/fatigue and weight loss.  HENT: Negative for  hearing loss and tinnitus.   Eyes: Negative for blurred vision and double vision.  Respiratory: Negative for cough, shortness of breath and wheezing.   Cardiovascular: Negative for chest pain, palpitations, orthopnea, claudication and leg swelling.  Gastrointestinal: Negative for abdominal pain, blood in stool, constipation, diarrhea, heartburn, melena, nausea and vomiting.  Genitourinary: Negative.   Musculoskeletal: Negative for joint pain and myalgias.  Skin: Negative for rash.  Neurological: Negative for dizziness, tingling, sensory change, weakness and headaches.  Endo/Heme/Allergies: Negative for polydipsia.  Psychiatric/Behavioral: Positive for depression. Negative for substance abuse. The patient is nervous/anxious.   All other systems reviewed and are negative.   Objective:     There were no vitals filed for this visit. There is no height or weight on file to calculate BMI.  General appearance: alert, no distress, WD/WN, female HEENT: normocephalic, sclerae anicteric, TMs pearly, nares patent, no discharge or erythema, pharynx normal Oral cavity: MMM, no lesions Neck: supple, no lymphadenopathy, no thyromegaly, no masses Heart: RRR, normal S1, S2, no murmurs Lungs: CTA bilaterally, no wheezes, rhonchi, or rales Abdomen: +bs, soft, LUQ abd tenderness, non distended, no masses, no hepatomegaly,  Splenomegaly noted.  No CVA tenderness  Left flank pain with deep palpation. Musculoskeletal: nontender, no swelling, no obvious deformity. Antalgic gait, left ankle edema with ecchymosis and erythema noted to medial and lateral malleolus. Extremities: no edema, no cyanosis, no clubbing Pulses: 2+ symmetric, upper and lower extremities, normal cap refill Neurological: alert, oriented x 3, CN2-12 intact, strength normal upper extremities and lower extremities, sensation normal throughout, DTRs 2+  no cerebellar signs. Psychiatric: anxious affect, behavior normal, pleasant   Izora Ribas, NP 2:04 PM [facility]

## 2020-08-24 ENCOUNTER — Encounter: Payer: Self-pay | Admitting: Adult Health

## 2020-08-24 ENCOUNTER — Ambulatory Visit: Payer: PPO | Admitting: Adult Health

## 2020-08-24 ENCOUNTER — Other Ambulatory Visit: Payer: Self-pay

## 2020-08-24 ENCOUNTER — Ambulatory Visit: Payer: PPO | Admitting: Adult Health Nurse Practitioner

## 2020-08-24 ENCOUNTER — Ambulatory Visit (INDEPENDENT_AMBULATORY_CARE_PROVIDER_SITE_OTHER): Payer: PPO | Admitting: Adult Health

## 2020-08-24 VITALS — BP 142/78 | HR 66 | Temp 97.7°F | Wt 136.6 lb

## 2020-08-24 DIAGNOSIS — J310 Chronic rhinitis: Secondary | ICD-10-CM

## 2020-08-24 DIAGNOSIS — J040 Acute laryngitis: Secondary | ICD-10-CM

## 2020-08-24 MED ORDER — PROMETHAZINE-DM 6.25-15 MG/5ML PO SYRP: 5.0000 mL | ORAL_SOLUTION | Freq: Four times a day (QID) | ORAL | 1 refills | Status: DC | PRN

## 2020-08-24 NOTE — Progress Notes (Signed)
Assessment and Plan:  Latoya [name] was seen today for acute visit.  Diagnoses and all orders for this visit:  Acute laryngitis Chronic rhinitis Restart allergy pill, restart flonase, voice rest Increase fluids,  Salt water gargles. Continue saline irrigations; try coricidin HBP If you do not get better in the next 3 days or if you start to have trouble swallowing, sore throat, fever, chills call the office.  -     promethazine-dextromethorphan (PROMETHAZINE-DM) 6.25-15 MG/5ML syrup; Take 5 mLs by mouth 4 (four) times daily as needed for cough.  Further disposition pending results of labs. Discussed med's effects and SE's.   Over 15 minutes of exam, counseling, chart review, and critical decision making was performed.   Future Appointments  Date Time Provider [place]  11/08/2020 10:00 AM [name], NP [facility] None    ------------------------------------------------------------------------------------------------------------------   HPI BP (!) 142/78   Pulse 66   Temp 97.7 F (36.5 C)   Wt 136 lb 9.6 oz (62 kg)   SpO2 98%   BMI 24.20 kg/m   69 y.o.female with chronic rhinitis, LPR presents for evaluation of URI sx.   She reports sx began last week with hoarseness, nasal congestion and post-nasal drip, intermittent HA, did have sore throat but reports this resolved, reports has had a dry cough since yesterday. Denies chest congestion, dyspnea, wheezing, fever/chills.   Recently took dexamethasone, reports caused rawness in mouth, doesn't want to take, feels bad taking prednisone as well.   She reports has been doing saline nasal sprays. She has been recommended antihistamine, nasal sprays, admits hasn't been doing this.   Husband just passed, currently coordinating funeral and overwhelmed, daughter is helping.   Past Medical History:  Diagnosis Date  . Anxiety and depression   . Arthritis   . Colon polyps   . GERD (gastroesophageal reflux disease)   .  Hemorrhoids   . Hiatal hernia   . History of kidney stones    passed  . Hyperlipemia   . Mixed hyperlipidemia 11/26/2013  . Panic disorder   . Passive smoke exposure 07/17/2018  . Renal insufficiency    stage 3 kidney disease per her PCP  . Stomach ulcer   . Unspecified essential hypertension 11/26/2013     Allergies  Allergen Reactions  . Other     All pain medications: Unknown/ CODEINE  . Prednisone Other (See Comments)    DYSPHORIA  . Vioxx [Rofecoxib]     unknown  . Entex T [Pseudoephedrine-Guaifenesin] Palpitations  . Levaquin [Levofloxacin] Rash    Tolerated Avelox without adverse effect 11/08/15.  . Sulfa Antibiotics Rash  . Trovan [Alatrofloxacin] Rash    Current Outpatient Medications on File Prior to Visit  Medication Sig  . ALPRAZolam (XANAX) 0.5 MG tablet Take     1/2  to 1 tablet        2 to 3 x /day         as needed for Nerves  . aspirin 81 MG tablet Take 81 mg by mouth at bedtime.   . cholecalciferol (VITAMIN D) 1000 units tablet Take 10,000 Units by mouth daily.  Marland Kitchen diltiazem (CARDIZEM) 120 MG tablet Take     1 tablet     2 x /day (every 12 hours)       for BP                  TAKE 1 TABLET BY MOUTH TWICE DAILY FOR BLOOD PRESSURE  . famotidine (PEPCID) 20 MG tablet  20 mg daily. As needed.  . furosemide (LASIX) 20 MG tablet Take 1 tablet (20 mg total) by mouth daily as needed for edema.  . meclizine (ANTIVERT) 25 MG tablet 1/2-1 pill up to 3 times daily for motion sickness/dizziness  . Mouthwash Compounding Base LIQD Take 5 mLs by mouth every 2 (two) hours as needed.  . potassium chloride SA (K-DUR) 20 MEQ tablet Take 1 tablet 2 x /day for Potassium  . pravastatin (PRAVACHOL) 40 MG tablet Take      1 tablet        at Bedtime        for Cholesterol                 TAKE 1 TABLET BY MOUTH DAILY FOR CHOLESTEROL  . propranolol (INDERAL) 10 MG tablet TAKE 1 TABLET TWICE DAILY FOR ANXIETY  . sertraline (ZOLOFT) 100 MG tablet Take     1 tablet     Daily       for Mood          TAKE 1 TABLET BY MOUTH DAILY FOR MOOD  . dexamethasone (DECADRON) 4 MG tablet Take 1 tab 3 x day - 3 days, then 2 x day - 3 days, then 1 tab daily   No current facility-administered medications on file prior to visit.    ROS: all negative except above.   Physical Exam:  BP (!) 142/78   Pulse 66   Temp 97.7 F (36.5 C)   Wt 136 lb 9.6 oz (62 kg)   SpO2 98%   BMI 24.20 kg/m   General Appearance: Well nourished, in no apparent distress. Eyes: PERRLA, EOMs, conjunctiva no swelling or erythema Sinuses: No Frontal/maxillary tenderness ENT/Mouth: Ext aud canals clear, TMs without erythema, bulging. No erythema, swelling, or exudate on post pharynx.  Tonsils not swollen or erythematous. Mildly hoarse vocal quality. Hearing normal.  Neck: Supple, thyroid normal.  Respiratory: Respiratory effort normal, BS equal bilaterally without rales, rhonchi, wheezing or stridor.  Cardio: RRR with no MRGs. Brisk peripheral pulses without edema.  Abdomen: Soft, + BS.  Non tender. Lymphatics: Non tender without lymphadenopathy.  Musculoskeletal: normal gait.  Skin: Warm, dry without rashes, lesions, ecchymosis.  Neuro: Cranial nerves intact. Normal muscle tone  Psych: Awake and oriented X 3, intermittently tearful affect, Insight and Judgment appropriate.     Izora Ribas, NP 3:45 PM [facility]

## 2020-08-24 NOTE — Patient Instructions (Signed)
Start daily allergy medication   Continue saline nasal sprays  Add Flonase/fluticasone nasal sprays - 1 in each nostril twice daily for 1-2 weeks, then can do once daily   Coricidin HBP       Nasal congestion  Little Remedies saline spray (aerosol/mist)- can try this, it is in the kids section - pseudoephedrine (Sudafed)- behind the counter, do not use if you have high blood pressure, medicine that have -D in them.  - phenylephrine (Sudafed PE) -Dextormethorphan + chlorpheniramine (Coridcidin HBP)- okay if you have high blood pressure -Oxymetazoline (Afrin) nasal spray- LIMIT to 3 days -Saline nasal spray -Neti pot (used distilled or bottled water)  Sore Throat  -Acetaminophen (Tyelnol) -Ibuprofen (Advil, motrin, aleve) -Drink a lot of water -Gargle with salt water - Rest your voice (don't talk) -Throat sprays -Cough drops  Headache  -Acetaminophen (Tyelnol) -Ibuprofen (Advil, motrin, aleve) - Exedrin, Exedrin Migraine  Allergy symptoms (cough, sneeze, runny nose, itchy eyes) -Claritin or loratadine cheapest but likely the weakest  -Zyrtec or certizine at night because it can make you sleepy -The strongest is allegra or fexafinadine  Cheapest at [employer], [employer], [employer]        Postnasal Drip Postnasal drip is the feeling of mucus going down the back of your throat. Mucus is a slimy substance that moistens and cleans your nose and throat, as well as the air pockets in face bones near your forehead and cheeks (sinuses). Small amounts of mucus pass from your nose and sinuses down the back of your throat all the time. This is normal. When you produce too much mucus or the mucus gets too thick, you can feel it. Some common causes of postnasal drip include:  Having more mucus because of: ? A cold or the flu. ? Allergies. ? Cold air. ? Certain medicines.  Having more mucus that is thicker because of: ? A sinus or nasal infection. ? Dry air. ? A food  allergy. Follow these instructions at home: Relieving discomfort   Gargle with a salt-water mixture 3-4 times a day or as needed. To make a salt-water mixture, completely dissolve -1 tsp of salt in 1 cup of warm water.  If the air in your home is dry, use a humidifier to add moisture to the air.  Use a saline spray or container (neti pot) to flush out the nose (nasal irrigation). These methods can help clear away mucus and keep the nasal passages moist. General instructions  Take over-the-counter and prescription medicines only as told by your health care provider.  Follow instructions from your health care provider about eating or drinking restrictions. You may need to avoid caffeine.  Avoid things that you know you are allergic to (allergens), like dust, mold, pollen, pets, or certain foods.  Drink enough fluid to keep your urine pale yellow.  Keep all follow-up visits as told by your health care provider. This is important. Contact a health care provider if:  You have a fever.  You have a sore throat.  You have difficulty swallowing.  You have headache.  You have sinus pain.  You have a cough that does not go away.  The mucus from your nose becomes thick and is green or yellow in color.  You have cold or flu symptoms that last more than 10 days. Summary  Postnasal drip is the feeling of mucus going down the back of your throat.  If your health care provider approves, use nasal irrigation or a nasal spray 2?4 times  a day.  Avoid things that you know you are allergic to (allergens), like dust, mold, pollen, pets, or certain foods. This information is not intended to replace advice given to you by your health care provider. Make sure you discuss any questions you have with your health care provider. Document Revised: 12/13/2018 Document Reviewed: 12/04/2016 Elsevier Patient Education  [place].

## 2020-09-02 ENCOUNTER — Telehealth: Payer: Self-pay

## 2020-09-02 NOTE — Telephone Encounter (Signed)
Patient states that she feels like she has a sinus infection again. Having a lot of pressure in her face and head, and having nose drainage. Requesting a prescription be sent to the pharmacy.

## 2020-09-16 ENCOUNTER — Encounter: Payer: Self-pay | Admitting: Adult Health Nurse Practitioner

## 2020-09-16 ENCOUNTER — Ambulatory Visit (INDEPENDENT_AMBULATORY_CARE_PROVIDER_SITE_OTHER): Payer: Medicare Other | Admitting: Adult Health Nurse Practitioner

## 2020-09-16 ENCOUNTER — Other Ambulatory Visit: Payer: Self-pay

## 2020-09-16 VITALS — BP 140/80 | HR 76 | Temp 97.7°F | Wt 141.0 lb

## 2020-09-16 DIAGNOSIS — Z1152 Encounter for screening for COVID-19: Secondary | ICD-10-CM | POA: Diagnosis not present

## 2020-09-16 DIAGNOSIS — J3489 Other specified disorders of nose and nasal sinuses: Secondary | ICD-10-CM

## 2020-09-16 DIAGNOSIS — J0140 Acute pansinusitis, unspecified: Secondary | ICD-10-CM | POA: Diagnosis not present

## 2020-09-16 LAB — POC COVID19 BINAXNOW: SARS Coronavirus 2 Ag: NEGATIVE

## 2020-09-16 MED ORDER — DOXYCYCLINE HYCLATE 100 MG PO TABS: 100.0000 mg | ORAL_TABLET | Freq: Two times a day (BID) | ORAL | 0 refills | Status: DC

## 2020-09-16 NOTE — Progress Notes (Signed)
Assessment and Plan:  1. Encounter for screening for COVID-19  - POC COVID-19 BinaxNow - Neg  Sinus pressure Acute non-recurrent pansinusitis -     doxycycline (VIBRA-TABS) 100 MG tablet; Take 1 tablet (100 mg total) by mouth 2 (two) times daily for 7 days. Continue saline Discussed nasal decongestant, monitor blood pressure, stop IF elevated  Contact office with any new or worsening symptoms.   Further disposition pending results of labs. Discussed med's effects and SE's.   Over 30 minutes of face to face interview,  exam, counseling, chart review, and critical decision making was performed.   Future Appointments  Date Time Provider [place]  11/08/2020 10:00 AM [name], NP [facility] None    ------------------------------------------------------------------------------------------------------------------   HPI 70 y.o.female presents for evaluation for sinus symptoms.  She reports that last Wed, 7days ago, she had funeral for her husband.  She reports the wind was blowing on ears and thinks that's where she go it from.  She reports that her nose is dry and stuffy feeling. She is having sinus tenderness.  She has been using saline nasal spray for this.  Her right ear has felt full at times.  Denies any sore throat, but it is scratchy feeling and red.  She denies a cough, chest pains or shortness of breath.        Past Medical History:  Diagnosis Date  . Anxiety and depression   . Arthritis   . Colon polyps   . GERD (gastroesophageal reflux disease)   . Hemorrhoids   . Hiatal hernia   . History of kidney stones    passed  . Hyperlipemia   . Mixed hyperlipidemia 11/26/2013  . Panic disorder   . Passive smoke exposure 07/17/2018  . Renal insufficiency    stage 3 kidney disease per her PCP  . Stomach ulcer   . Unspecified essential hypertension 11/26/2013     Allergies  Allergen Reactions  . Other     All pain medications: Unknown/ CODEINE  .  Prednisone Other (See Comments)    DYSPHORIA  . Vioxx [Rofecoxib]     unknown  . Entex T [Pseudoephedrine-Guaifenesin] Palpitations  . Levaquin [Levofloxacin] Rash    Tolerated Avelox without adverse effect 11/08/15.  . Sulfa Antibiotics Rash  . Trovan [Alatrofloxacin] Rash    Current Outpatient Medications on File Prior to Visit  Medication Sig  . ALPRAZolam (XANAX) 0.5 MG tablet Take     1/2  to 1 tablet        2 to 3 x /day         as needed for Nerves  . aspirin 81 MG tablet Take 81 mg by mouth at bedtime.   . cholecalciferol (VITAMIN D) 1000 units tablet Take 10,000 Units by mouth daily.  Marland Kitchen diltiazem (CARDIZEM) 120 MG tablet Take     1 tablet     2 x /day (every 12 hours)       for BP                  TAKE 1 TABLET BY MOUTH TWICE DAILY FOR BLOOD PRESSURE  . famotidine (PEPCID) 20 MG tablet 20 mg daily. As needed.  . furosemide (LASIX) 20 MG tablet Take 1 tablet (20 mg total) by mouth daily as needed for edema.  . meclizine (ANTIVERT) 25 MG tablet 1/2-1 pill up to 3 times daily for motion sickness/dizziness  . Mouthwash Compounding Base LIQD Take 5 mLs by mouth every 2 (two) hours  as needed.  . potassium chloride SA (K-DUR) 20 MEQ tablet Take 1 tablet 2 x /day for Potassium  . pravastatin (PRAVACHOL) 40 MG tablet Take      1 tablet        at Bedtime        for Cholesterol                 TAKE 1 TABLET BY MOUTH DAILY FOR CHOLESTEROL  . promethazine-dextromethorphan (PROMETHAZINE-DM) 6.25-15 MG/5ML syrup Take 5 mLs by mouth 4 (four) times daily as needed for cough.  . propranolol (INDERAL) 10 MG tablet TAKE 1 TABLET TWICE DAILY FOR ANXIETY  . sertraline (ZOLOFT) 100 MG tablet Take     1 tablet     Daily       for Mood         TAKE 1 TABLET BY MOUTH DAILY FOR MOOD   No current facility-administered medications on file prior to visit.    ROS: all negative except above.   Physical Exam:  BP 140/80   Pulse 76   Temp 97.7 F (36.5 C)   Wt 141 lb (64 kg)   SpO2 95%   BMI 24.98 kg/m    General Appearance: Well nourished, in no apparent distress. Eyes: PERRLA, EOMs, conjunctiva no swelling or erythema Sinuses:  Frontal/maxillary tenderness ENT/Mouth: Ext aud canals clear, TMs without erythema, bulging. No erythema, swelling, or exudate on post pharynx.  Tonsils not swollen or erythematous. Hearing normal.  Neck: Supple, thyroid normal.  Respiratory: Respiratory effort normal, BS equal bilaterally without rales, rhonchi, wheezing or stridor.  Cardio: RRR with no MRGs. Brisk peripheral pulses without edema.  Abdomen: Soft, + BS.  Non tender, no guarding, rebound, hernias, masses. Lymphatics: Non tender without lymphadenopathy.  Musculoskeletal: Full ROM, 5/5 strength, normal gait.  Skin: Warm, dry without rashes, lesions, ecchymosis.  Neuro: Cranial nerves intact. Normal muscle tone, no cerebellar symptoms. Sensation intact.  Psych: Awake and oriented X 3, normal affect, Insight and Judgment appropriate.       Garnet Sierras, Laqueta Jean, DNP [facility] 09/16/2020  2:45 PM

## 2020-09-16 NOTE — Patient Instructions (Addendum)
   Take Zyrtec (Cetirazine) every night while you have symptoms. You can get this any any pharmacy.  Take this at night.  Take the Norel Ad every 4-6 hours OR sudafed while having symptoms.  Check your blood pressure while taking this as it can increase blood pressure.   We sent in an antibiotic for you to take.  Take this with food.   Be sure to increase the amount of water you drink.  This will also help with the swelling in your legs.

## 2020-09-23 ENCOUNTER — Other Ambulatory Visit: Payer: Self-pay | Admitting: Adult Health

## 2020-09-23 MED ORDER — MONTELUKAST SODIUM 10 MG PO TABS
10.0000 mg | ORAL_TABLET | Freq: Every day | ORAL | 2 refills | Status: DC
Start: 2020-09-23 — End: 2020-10-13

## 2020-09-29 ENCOUNTER — Telehealth: Payer: Self-pay

## 2020-09-29 NOTE — Telephone Encounter (Signed)
Informed patient of rx & just to ascertain how she was doing, patient states that since starting meds she is getting better.

## 2020-09-29 NOTE — Telephone Encounter (Signed)
-----   Message from [name], NP sent at 09/23/2020  7:59 PM EST ----- Regarding: RE: med request Please follow up and clarify what she has been doing for this at home  She has been recommended daily allergy pill, saline irrigations, nasal sprays (astalin/flonase), and Kyra recommended Norel AD and sudafed if BP tolerated. Please ask what she is currently taking.   She has had persistent/recurrent upper respiratory sx for several months now, suspect underlying allergies are main contributor. I sent in Singulair to try adding at night to help her chronic rhinitis sx. Take 1 tab daily at night.   Thanks!  ----- Message ----- From: [name], CMA Sent: 09/23/2020   5:19 PM EST To: [name], NP Subject: med request                                    Still having drainage & sore throat   Please advise

## 2020-10-13 ENCOUNTER — Telehealth: Payer: Self-pay

## 2020-10-13 ENCOUNTER — Other Ambulatory Visit: Payer: Self-pay

## 2020-10-13 ENCOUNTER — Encounter: Payer: Self-pay | Admitting: Adult Health

## 2020-10-13 ENCOUNTER — Ambulatory Visit (INDEPENDENT_AMBULATORY_CARE_PROVIDER_SITE_OTHER): Payer: Medicare Other | Admitting: Adult Health

## 2020-10-13 VITALS — BP 142/92 | HR 83 | Temp 97.5°F | Ht 63.0 in | Wt 135.0 lb

## 2020-10-13 DIAGNOSIS — H6983 Other specified disorders of Eustachian tube, bilateral: Secondary | ICD-10-CM

## 2020-10-13 DIAGNOSIS — M26623 Arthralgia of bilateral temporomandibular joint: Secondary | ICD-10-CM | POA: Diagnosis not present

## 2020-10-13 DIAGNOSIS — J3089 Other allergic rhinitis: Secondary | ICD-10-CM

## 2020-10-13 MED ORDER — CETIRIZINE HCL 10 MG PO TABS
10.0000 mg | ORAL_TABLET | Freq: Every day | ORAL | 1 refills | Status: DC
Start: 2020-10-13 — End: 2020-11-08

## 2020-10-13 MED ORDER — FLUTICASONE PROPIONATE 50 MCG/ACT NA SUSP
1.0000 | Freq: Every day | NASAL | 2 refills | Status: DC | PRN
Start: 2020-10-13 — End: 2020-12-27

## 2020-10-13 NOTE — Patient Instructions (Addendum)
   Please take any antihistamine - zyrtec, allegra, claritin or any generic  Please call me back with what "mucus medication" you are taking   If congestion and ear discomfort continues, start flonase nasal spray   Stop chewing gum     What is the TMJ? The temporomandibular (tem-PUH-ro-man-DIB-yoo-ler) joint, or the TMJ, connects the upper and lower jawbones. This joint allows the jaw to open wide and move back and forth when you chew, talk, or yawn.There are also several muscles that help this joint move. There can be muscle tightness and pain in the muscle that can cause several symptoms.  What causes TMJ pain? There are many causes of TMJ pain. Repeated chewing (for example, chewing gum) and clenching your teeth can cause pain in the joint. Some TMJ pain has no obvious cause. What can I do to ease the pain? There are many things you can do to help your pain get better. When you have pain:  Eat soft foods and stay away from chewy foods (for example, taffy) Try to use both sides of your mouth to chew Don't chew gum Massage Don't open your mouth wide (for example, during yawning or singing) Don't bite your cheeks or fingernails Lower your amount of stress and worry Applying a warm, damp washcloth to the joint may help. Over-the-counter pain medicines such as ibuprofen (one brand: Advil) or acetaminophen (one brand: Tylenol) might also help. Do not use these medicines if you are allergic to them or if your doctor told you not to use them. How can I stop the pain from coming back? When your pain is better, you can do these exercises to make your muscles stronger and to keep the pain from coming back:  Resisted mouth opening: Place your thumb or two fingers under your chin and open your mouth slowly, pushing up lightly on your chin with your thumb. Hold for three to six seconds. Close your mouth slowly. Resisted mouth closing: Place your thumbs under your chin and your two index fingers  on the ridge between your mouth and the bottom of your chin. Push down lightly on your chin as you close your mouth. Tongue up: Slowly open and close your mouth while keeping the tongue touching the roof of the mouth. Side-to-side jaw movement: Place an object about one fourth of an inch thick (for example, two tongue depressors) between your front teeth. Slowly move your jaw from side to side. Increase the thickness of the object as the exercise becomes easier Forward jaw movement: Place an object about one fourth of an inch thick between your front teeth and move the bottom jaw forward so that the bottom teeth are in front of the top teeth. Increase the thickness of the object as the exercise becomes easier. These exercises should not be painful. If it hurts to do these exercises, stop doing them and talk to your family doctor.

## 2020-10-13 NOTE — Progress Notes (Signed)
Assessment and Plan:  Diagnoses and all orders for this visit:  Eustachian tube dysfunction, bilateral Non-seasonal allergic rhinitis, unspecified trigger Nno infection- suggest flonase/nasonex, allergy pills, and hold nose while drinking water to autoinflate, if drainage from ear, fever, chills, HA, nausea, call the office or go to the ER Discussed chronic recurrent issue, needs to continue H2i DAILY, continue saline sprays PRN Add flonase, astalin nasal sprays PRN; ok to use tyelnol/detromethorphan product indefinitely PRN -     cetirizine (ZYRTEC ALLERGY) 10 MG tablet; Take 1 tablet (10 mg total) by mouth daily. -     fluticasone (FLONASE) 50 MCG/ACT nasal spray; Place 1-2 sprays into both nostrils daily as needed for allergies or rhinitis.  Tenderness of both temporomandibular joints information given to the patient, STOP gum/decrease hard foods, warm wet wash clothes, decrease stress,  can do massage, and exercise.  Very important to follow up with dentist   Further disposition pending results of labs. Discussed med's effects and SE's.   Over 15 minutes of exam, counseling, chart review, and critical decision making was performed.   Future Appointments  Date Time Provider [place]  11/08/2020 10:00 AM [name], NP [facility] None    ------------------------------------------------------------------------------------------------------------------   HPI BP (!) 142/92   Pulse 83   Temp (!) 97.5 F (36.4 C)   Ht 5\' 3"  (1.6 m)   Wt 135 lb (61.2 kg)   SpO2 98%   BMI 23.91 kg/m   70 y.o.female with chronic rhinitis, LPR, eustachian tube dysfunction presents for evaluation of congestion and intermittent bilateral ear discomfort.   She reports current sx began last week; reports she started having pressure in left ear, then started having in R, sx are intermittent. Denies sharp pain, hearing problems, tinnitis. Also reports intermittent mild HA and frontal sinus.  She has persistent rhinitis, intermittently with congestion, takes saline nasal spray which she feels works well.   She reports did take a cold/sinus medication (tylenol and dextromethorphan) that did help significantly, feeling much better today. Has zyrtec, took 1 day but hasn't continued. Was prescribed singulare after last episode for 2 weeks for didn't feel this was helpful.   She was notably evaluated by [facility] Dr. Pietro Cassis 05/12/2020 due to recurrent serous otitis and ear complaints; he diagnosed with eustachian tube dysfunction. He recommended she utilize Flonase and astalin nasal sprays PRN.   Past Medical History:  Diagnosis Date  . Anxiety and depression   . Arthritis   . Colon polyps   . GERD (gastroesophageal reflux disease)   . Hemorrhoids   . Hiatal hernia   . History of kidney stones    passed  . Hyperlipemia   . Mixed hyperlipidemia 11/26/2013  . Panic disorder   . Passive smoke exposure 07/17/2018  . Renal insufficiency    stage 3 kidney disease per her PCP  . Stomach ulcer   . Unspecified essential hypertension 11/26/2013     Allergies  Allergen Reactions  . Other     All pain medications: Unknown/ CODEINE  . Prednisone Other (See Comments)    DYSPHORIA  . Vioxx [Rofecoxib]     unknown  . Entex T [Pseudoephedrine-Guaifenesin] Palpitations  . Levaquin [Levofloxacin] Rash    Tolerated Avelox without adverse effect 11/08/15.  . Sulfa Antibiotics Rash  . Trovan [Alatrofloxacin] Rash    Current Outpatient Medications on File Prior to Visit  Medication Sig  . ALPRAZolam (XANAX) 0.5 MG tablet Take     1/2  to 1 tablet  2 to 3 x /day         as needed for Nerves  . aspirin 81 MG tablet Take 81 mg by mouth at bedtime.   . cholecalciferol (VITAMIN D) 1000 units tablet Take 10,000 Units by mouth daily.  Marland Kitchen diltiazem (CARDIZEM) 120 MG tablet Take     1 tablet     2 x /day (every 12 hours)       for BP                  TAKE 1 TABLET BY MOUTH TWICE DAILY FOR  BLOOD PRESSURE  . famotidine (PEPCID) 20 MG tablet 20 mg daily. As needed.  . furosemide (LASIX) 20 MG tablet Take 1 tablet (20 mg total) by mouth daily as needed for edema.  . meclizine (ANTIVERT) 25 MG tablet 1/2-1 pill up to 3 times daily for motion sickness/dizziness  . Mouthwash Compounding Base LIQD Take 5 mLs by mouth every 2 (two) hours as needed.  . potassium chloride SA (K-DUR) 20 MEQ tablet Take 1 tablet 2 x /day for Potassium  . pravastatin (PRAVACHOL) 40 MG tablet Take      1 tablet        at Bedtime        for Cholesterol                 TAKE 1 TABLET BY MOUTH DAILY FOR CHOLESTEROL  . promethazine-dextromethorphan (PROMETHAZINE-DM) 6.25-15 MG/5ML syrup Take 5 mLs by mouth 4 (four) times daily as needed for cough.  . propranolol (INDERAL) 10 MG tablet TAKE 1 TABLET TWICE DAILY FOR ANXIETY  . sertraline (ZOLOFT) 100 MG tablet Take     1 tablet     Daily       for Mood         TAKE 1 TABLET BY MOUTH DAILY FOR MOOD   No current facility-administered medications on file prior to visit.    ROS: all negative except above.   Physical Exam:  BP (!) 142/92   Pulse 83   Temp (!) 97.5 F (36.4 C)   Ht 5\' 3"  (1.6 m)   Wt 135 lb (61.2 kg)   SpO2 98%   BMI 23.91 kg/m   General Appearance: Well nourished, in no apparent distress. Eyes: PERRLA, EOMs, conjunctiva no swelling or erythema Sinuses: No Frontal/maxillary tenderness ENT/Mouth: Ext aud canals clear, TMs without erythema, bulging. No erythema, swelling, or exudate on post pharynx.  Tonsils not swollen or erythematous. Hearing normal. Mildly hoarse vocal quality. Bil TMJ tenderness without popping, clicking, dislocation.   Neck: Supple, thyroid normal. Tenderness over bil upper anterior cervical chane without palpable adenopathy Respiratory: Respiratory effort normal, BS equal bilaterally without rales, rhonchi, wheezing or stridor.  Cardio: RRR with no MRGs. Brisk peripheral pulses without edema.  Abdomen: Soft, + BS.  Non  tender, no guarding, rebound, hernias, masses. Lymphatics: Non tender without lymphadenopathy.  Musculoskeletal:  normal gait.  Skin: Warm, dry without rashes, lesions, ecchymosis.  Neuro: Cranial nerves intact. Normal muscle tone  Psych: Awake and oriented X 3, normal affect, Insight and Judgment appropriate.     Izora Ribas, NP 5:16 PM [facility]

## 2020-10-13 NOTE — Telephone Encounter (Signed)
Patient states that she was told to call about what she has been taking. States that is a mucus relief: acetaminophen/dextromethorphan.   Relieves H/A, Fever, Sore Throat, Nasal/Chest congestion and loosens mucus.

## 2020-10-14 NOTE — Telephone Encounter (Signed)
Patient advised.

## 2020-10-20 ENCOUNTER — Other Ambulatory Visit: Payer: Self-pay | Admitting: Adult Health

## 2020-11-05 NOTE — Progress Notes (Signed)
CPE  Assessment and Plan:   Encounter for annual wellness exam Yearly Requested covid 19 vaccine documentation  Essential hypertension - continue medications, DASH diet, exercise and monitor at home. Call if greater than 130/80.  -     CBC with Differential/Platelet -     CMP/GFR -     TSH  Mixed hyperlipidemia -continue medications, check lipids, decrease fatty foods, increase activity.  -     Lipid panel  Aortic atherosclerosis ([place]) Noted on CT 2016 Control blood pressure, cholesterol, glucose, increase exercise.   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  Hiatal hernia Continue GERD medication, follow up GI as needed Avoid lying down after meals   Chronic rhinitis Allergy pill daily, nasal sprays, increase H20, allergy hygiene explained.  Gastroesophageal reflux disease, esophagitis presence not specified/LPR Continue H2 blocker, Add diet discussed  Generalized anxiety disorder Continue medications; doing well limiting benzo stress management techniques discussed, increase water, good sleep hygiene discussed, increase exercise, and increase veggies.   Vitamin D deficiency At goal at recent check; continue to recommend supplementation for goal of 70-100 Defer vitamin D level  Chronic cough syndrome Continue GERD meds, follow up Dr. Melvyn Novas as needed.   Osteoporosis DEXA due 06/2021, order placed to schedule Declining alendronate at this time,  would consider continue Vit D and Ca, weight bearing exercises    Orders Placed This Encounter  Procedures  . DG Bone Density  . US THYROID  . CBC with Differential/Platelet  . COMPLETE METABOLIC PANEL WITH GFR  . Magnesium  . Lipid panel  . TSH  . Hemoglobin A1c  . VITAMIN D 25 Hydroxy (Vit-D Deficiency, Fractures)  . Microalbumin / creatinine urine ratio  . Urinalysis, Routine w reflex microscopic  . EKG 12-Lead     Over 30 minutes of face to face exam, counseling, chart review and critical decision making was performed Future Appointments  Date Time Provider [place]  11/08/2021 10:00 AM [name], NP [facility] None    Plan:   During the course of the visit the patient was educated and counseled about appropriate screening and preventive services including:    Pneumococcal vaccine   Prevnar 13  Influenza vaccine  Td vaccine  Screening electrocardiogram  Bone densitometry screening  Colorectal cancer screening  Diabetes screening  Glaucoma screening  Nutrition counseling   Advanced directives: requested    Subjective:  Latoya [name] is a 70 y.o. female who presents for CPE. She has Esophageal reflux; Generalized anxiety disorder; Chronic cough; Essential hypertension; Hyperlipidemia, mixed; Abnormal glucose; Vitamin D deficiency; Medication management; Thyroid nodule; Aortic atherosclerosis (HCC); CKD (chronic kidney disease) stage 3, GFR 30-59 ml/min ([place]); Hiatal hernia; Chronic rhinitis; Laryngopharyngeal reflux (LPR); Multiple lentigines syndrome ([place]); FHx: heart disease; Osteoporosis; Recurrent acute serous otitis media of right ear; History of eustachian tube dysfunction; TB lung, latent; and Tenderness of both temporomandibular joints on their problem list.    Husband passed in 2021 with late stage lung disease, has 2 grown daughters; stress with older daughter Arbie Cookey, very difficult to be around,  trying to work through it.   She  has GERD/LPR, had EGD 10/2017, on pepcid 40 mg daily per Dr. Carlean Purl; she also has dx of chronic cough syndrome per Dr. Melvyn Novas after extensive workup for persistent cough.  She was notably evaluated by [facility] ENT Dr. Pietro Cassis 05/12/2020 due to recurrent serous otitis and ear complaints; he diagnosed with eustachian tube dysfunction.  He recommended she utilize Flonase and astalin nasal sprays PRN.   she has a diagnosis of depression/anxiety and is currently on zoloft 100 mg daily, xanax 0.5 mg PRN, use varies, avoids taking daily.   BMI is Body mass index is 23.67 kg/m., she has been working on diet and exercise. Wt Readings from Last 3 Encounters:  11/08/20 133 lb 9.6 oz (60.6 kg)  10/13/20 135 lb (61.2 kg)  09/16/20 141 lb (64 kg)   Her blood pressure has been controlled at home, today their BP is BP: 122/64  She does workout, walking, yard work, house work. She denies chest pain, shortness of breath, dizziness.  She has aortic atherosclerosis per CT 2016.    She is on cholesterol medication (pravastatin 40 mg daily) and denies myalgias. Her cholesterol is at LDL goal. The cholesterol last visit was:   Lab Results  Component Value Date   CHOL 185 05/25/2020   HDL 52 05/25/2020   LDLCALC 99 05/25/2020   TRIG 219 (H) 05/25/2020   CHOLHDL 3.6 05/25/2020    She has been working on diet and exercise for glucose management, and denies paresthesia of the feet, polydipsia, polyuria and visual disturbances. Last A1C in the office was:  Lab Results  Component Value Date   HGBA1C 5.5 05/25/2020   She admits to poor water intake. Last GFR: Lab Results  Component Value Date   GFRNONAA 41 (L) 05/19/2020   GFRNONAA 54 (L) 01/29/2020   GFRNONAA 52 (L) 11/06/2019   Patient is on Vitamin D supplement for deficency.   Lab Results  Component Value Date   VD25OH 2 05/25/2020       Medication Review: Current Outpatient Medications on File Prior to Visit  Medication Sig  . ALPRAZolam  (XANAX) 0.5 MG tablet Take     1/2  to 1 tablet        2 to 3 x /day         as needed for Nerves  . aspirin 81 MG tablet Take 81 mg by mouth at bedtime.   . cholecalciferol (VITAMIN D) 1000 units tablet Take 10,000 Units by mouth daily.  Marland Kitchen diltiazem (CARDIZEM) 120 MG tablet Take     1 tablet     2 x /day (every 12 hours)       for BP                  TAKE 1 TABLET BY MOUTH TWICE DAILY FOR BLOOD PRESSURE  . famotidine (PEPCID) 20 MG tablet 20 mg daily. As needed.  . furosemide (LASIX) 20 MG tablet Take 1 tablet (20 mg total) by mouth daily as needed for edema.  . potassium chloride SA (K-DUR) 20 MEQ tablet Take 1 tablet 2 x /day for Potassium  . pravastatin (PRAVACHOL) 40 MG tablet Take      1 tablet        at Bedtime        for Cholesterol                 TAKE 1 TABLET BY MOUTH DAILY FOR CHOLESTEROL  .  propranolol (INDERAL) 10 MG tablet TAKE 1 TABLET TWICE DAILY FOR ANXIETY  . sertraline (ZOLOFT) 100 MG tablet Take     1 tablet     Daily       for Mood         TAKE 1 TABLET BY MOUTH DAILY FOR MOOD  . fluticasone (FLONASE) 50 MCG/ACT nasal spray Place 1-2 sprays into both nostrils daily as needed for allergies or rhinitis. (Patient not taking: Reported on 11/08/2020)   No current facility-administered medications on file prior to visit.     Allergies  Allergen Reactions  . Other     All pain medications: Unknown/ CODEINE  . Prednisone Other (See Comments)    DYSPHORIA  . Vioxx [Rofecoxib]     unknown  . Entex T [Pseudoephedrine-Guaifenesin] Palpitations  . Levaquin [Levofloxacin] Rash    Tolerated Avelox without adverse effect 11/08/15.  . Sulfa Antibiotics Rash  . Trovan [Alatrofloxacin] Rash    Current Problems (verified) Patient Active Problem List   Diagnosis Date Noted  . Tenderness of both temporomandibular joints 10/13/2020  . 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 Recurrent acute serous otitis media of right ear 05/19/2020  . History of eustachian tube dysfunction 05/19/2020  .  Osteoporosis 06/23/2019  . FHx: heart disease 11/27/2017  . Multiple lentigines syndrome ([place]) 06/20/2017  . Laryngopharyngeal reflux (LPR) 10/12/2016  . Chronic rhinitis 10/09/2016  . Hiatal hernia 07/14/2016  . Aortic atherosclerosis ([place]) 06/30/2016  . CKD (chronic kidney disease) stage 3, GFR 30-59 ml/min (HCC) 06/30/2016  . Thyroid nodule 12/24/2015  . Abnormal glucose 05/01/2014  . Vitamin D deficiency 05/01/2014  . Medication management 05/01/2014  . Essential hypertension 11/26/2013  . Hyperlipidemia, mixed 11/26/2013  . Chronic cough 09/19/2013  . Generalized anxiety disorder 07/11/2013  . Esophageal reflux 11/29/2012    Screening Tests Immunization History  Administered Date(s) Administered  . Influenza Split 06/04/2014, 06/05/2015  . Influenza, High Dose Seasonal PF 05/30/2017, 05/22/2018, 05/21/2019  . Influenza-Unspecified 06/06/2013, 06/13/2016  . Pneumococcal Conjugate-13 06/05/2015  . Pneumococcal Polysaccharide-23 04/06/2014  . Pneumococcal-Unspecified 09/04/1996, 09/04/2016  . Td 09/04/2005  . Tdap 03/22/2017   Preventative care: Last colonoscopy: 12/2012 Carlean Purl, 10 year follow up EGD 10/2017  Last mammogram: 06/23/2020 Last pap smear/pelvic exam: 2016 normal  DEXA: 06/2019 osteoporosis, T -2.7, fosamax was initiated but admits hasn't been taking.   US thyroid 01/2020, repeat 1 year, order placed  Prior vaccinations: TD or Tdap: 2018  Influenza: 2020 Pneumococcal: 2015 Prevnar13: 2016 Shingles/Zostavax: declines due to cost Covid 19: has had 3/3, 2021, - record requested  Names of Other Physician/Practitioners you currently use: 1. [facility] here for primary care 2. [facility], eye doctor, last visit 2021, has schedulee 12/2020 3. Ladell Pier, dentist, last visit 2020, Due, encouraged to schedule   Patient Care Team: Unk Pinto, MD as PCP - General (Internal Medicine) Aquilla Hacker, MD as  Referring Physician (Psychiatry) Jannette Spanner, MD as Referring Physician (Dermatology)  SURGICAL HISTORY She  has a past surgical history that includes Abdominal hysterectomy; Tubal ligation; Foot surgery (Left, 2008); Colonoscopy; Upper gastrointestinal endoscopy; Oophorectomy (Right); Carpometacarpel [facility]) suspension plasty (Right, 12/08/2013); I & D extremity (Right, 01/20/2017); Hand surgery (Right); and Cataract extraction (Right, 2020). FAMILY HISTORY Her family history includes Asthma in her mother; CVA in her brother; Dementia in her brother; Heart Problems in her maternal grandfather; Heart attack in her brother; Heart disease in her brother and father; Other in her brother; Pancreatic cancer  in her brother; Tuberculosis in her daughter. SOCIAL HISTORY She  reports that she is a non-smoker but has been exposed to tobacco smoke. She has never used smokeless tobacco. She reports that she does not drink alcohol and does not use drugs.  Depression/mood screen:   Depression screen [facility] 2/9 07/24/2020  Decreased Interest 0  Down, Depressed, Hopeless 0  PHQ - 2 Score 0  Altered sleeping -  Tired, decreased energy -  Change in appetite -  Feeling bad or failure about yourself  -  Trouble concentrating -  Moving slowly or fidgety/restless -  Suicidal thoughts -  PHQ-9 Score -  Difficult doing work/chores -  Some recent data might be hidden      Review of Systems  Constitutional: Negative.  Negative for chills and fever.  HENT: Negative.   Eyes: Negative.   Respiratory: Negative for cough, hemoptysis, sputum production, shortness of breath and wheezing.   Cardiovascular: Negative for chest pain, palpitations, orthopnea, claudication, leg swelling and PND.  Gastrointestinal: Negative for abdominal pain, blood in stool, constipation, diarrhea, heartburn, melena, nausea and vomiting.  Genitourinary: Negative.   Musculoskeletal: Negative.   Skin: Negative.   Neurological: Negative  for dizziness, tingling, tremors, sensory change, speech change, focal weakness, seizures, loss of consciousness and headaches.  Endo/Heme/Allergies: Negative.   Psychiatric/Behavioral: Negative for depression, substance abuse and suicidal ideas. The patient is not nervous/anxious and does not have insomnia.     Objective:     Today's Vitals   11/08/20 1008  BP: 122/64  Pulse: 75  Temp: (!) 97.3 F (36.3 C)  SpO2: 97%  Weight: 133 lb 9.6 oz (60.6 kg)  Height: 5\' 3"  (1.6 m)   Body mass index is 23.67 kg/m.  General appearance: alert, no distress, WD/WN, female HEENT: normocephalic, sclerae anicteric, TMs pearly, nares patent, no discharge or erythema, pharynx normal Oral cavity: MMM, no lesions Neck: supple, no lymphadenopathy, no thyromegaly, no masses Heart: RRR, normal S1, S2, no murmurs Lungs: CTA bilaterally, no wheezes, rhonchi, or rales Abdomen: +bs, soft, non-tender, non distended, no masses, no hepatomegaly,  Splenomegaly noted.   Musculoskeletal: Full ROM all extremities, nontender, no swelling, no obvious deformity. Non-antalgic gait.  Extremities: no edema, no cyanosis, no clubbing Pulses: 2+ symmetric, upper and lower extremities, normal cap refill Neurological: alert, oriented x 3, CN2-12 intact, strength normal upper extremities and lower extremities, sensation normal throughout, DTRs 2+  no cerebellar signs. Psychiatric: Normal affect, behavior normal, pleasant  Breasts: declines GU: no concerns, defer  EKG: NSR, NSCPT  The patient's weight, height, BMI, and visual acuity have been recorded in the chart.  I have made referrals, counseling, and provided education to the patient based on review of the above and I have provided the patient with a written personalized care plan for preventive services.     Latoya Ribas, NP 10:43 AM [name]

## 2020-11-08 ENCOUNTER — Encounter: Payer: Self-pay | Admitting: Adult Health

## 2020-11-08 ENCOUNTER — Other Ambulatory Visit: Payer: Self-pay

## 2020-11-08 ENCOUNTER — Ambulatory Visit (INDEPENDENT_AMBULATORY_CARE_PROVIDER_SITE_OTHER): Payer: Medicare Other | Admitting: Adult Health

## 2020-11-08 VITALS — BP 122/64 | HR 75 | Temp 97.3°F | Ht 63.0 in | Wt 133.6 lb

## 2020-11-08 DIAGNOSIS — I10 Essential (primary) hypertension: Secondary | ICD-10-CM

## 2020-11-08 DIAGNOSIS — Z1329 Encounter for screening for other suspected endocrine disorder: Secondary | ICD-10-CM

## 2020-11-08 DIAGNOSIS — Z6823 Body mass index (BMI) 23.0-23.9, adult: Secondary | ICD-10-CM

## 2020-11-08 DIAGNOSIS — Z0000 Encounter for general adult medical examination without abnormal findings: Secondary | ICD-10-CM | POA: Diagnosis not present

## 2020-11-08 DIAGNOSIS — E041 Nontoxic single thyroid nodule: Secondary | ICD-10-CM

## 2020-11-08 DIAGNOSIS — Q858 Other phakomatoses, not elsewhere classified: Secondary | ICD-10-CM

## 2020-11-08 DIAGNOSIS — F411 Generalized anxiety disorder: Secondary | ICD-10-CM

## 2020-11-08 DIAGNOSIS — Z131 Encounter for screening for diabetes mellitus: Secondary | ICD-10-CM

## 2020-11-08 DIAGNOSIS — Z1389 Encounter for screening for other disorder: Secondary | ICD-10-CM

## 2020-11-08 DIAGNOSIS — E782 Mixed hyperlipidemia: Secondary | ICD-10-CM | POA: Diagnosis not present

## 2020-11-08 DIAGNOSIS — Z136 Encounter for screening for cardiovascular disorders: Secondary | ICD-10-CM | POA: Diagnosis not present

## 2020-11-08 DIAGNOSIS — Z79899 Other long term (current) drug therapy: Secondary | ICD-10-CM

## 2020-11-08 DIAGNOSIS — M810 Age-related osteoporosis without current pathological fracture: Secondary | ICD-10-CM

## 2020-11-08 DIAGNOSIS — R7309 Other abnormal glucose: Secondary | ICD-10-CM

## 2020-11-08 DIAGNOSIS — J310 Chronic rhinitis: Secondary | ICD-10-CM

## 2020-11-08 DIAGNOSIS — I700 Atherosclerosis of aorta: Secondary | ICD-10-CM | POA: Diagnosis not present

## 2020-11-08 DIAGNOSIS — K219 Gastro-esophageal reflux disease without esophagitis: Secondary | ICD-10-CM

## 2020-11-08 DIAGNOSIS — L814 Other melanin hyperpigmentation: Secondary | ICD-10-CM

## 2020-11-08 DIAGNOSIS — Q8789 Other specified congenital malformation syndromes, not elsewhere classified: Secondary | ICD-10-CM

## 2020-11-08 DIAGNOSIS — Z8249 Family history of ischemic heart disease and other diseases of the circulatory system: Secondary | ICD-10-CM | POA: Diagnosis not present

## 2020-11-08 DIAGNOSIS — N1831 Chronic kidney disease, stage 3a: Secondary | ICD-10-CM | POA: Diagnosis not present

## 2020-11-08 DIAGNOSIS — E559 Vitamin D deficiency, unspecified: Secondary | ICD-10-CM

## 2020-11-08 MED ORDER — MOUTHWASH COMPOUNDING BASE PO LIQD: 5.0000 mL | ORAL | 1 refills | Status: DC | PRN

## 2020-11-08 NOTE — Patient Instructions (Addendum)
Ms. Dilling , Thank you for taking time to come for your Annual Wellness Visit. I appreciate your ongoing commitment to your health goals. Please review the following plan we discussed and let me know if I can assist you in the future.   These are the goals we discussed: Goals    . DIET - INCREASE WATER INTAKE     65-80+    . Exercise 150 min/wk Moderate Activity     Exercises to help prevent osteoporosis daily        This is a list of the screening recommended for you and due dates:  Health Maintenance  Topic Date Due  . COVID-19 Vaccine (1) Never done  . Flu Shot  04/04/2020  . Mammogram  06/23/2022  . Colon Cancer Screening  12/13/2022  . Tetanus Vaccine  03/23/2027  . DEXA scan (bone density measurement)  Completed  .  Hepatitis C: One time screening is recommended by Center for Disease Control  (CDC) for  adults born from [birth year] through 1966.   Completed  . HPV Vaccine  Aged Out  . Pneumonia vaccines  Discontinued   Please ask [facility] for flu vaccine and also get record of covid 19 - we have not received this     Know what a healthy weight is for you (roughly BMI <25) and aim to maintain this  Aim for 7+ servings of fruits and vegetables daily  65-80+ fluid ounces of water or unsweet tea for healthy kidneys  Limit to max 1 drink of alcohol per day; avoid smoking/tobacco  Limit animal fats in diet for cholesterol and heart health - choose grass fed whenever available  Avoid highly processed foods, and foods high in saturated/trans fats  Aim for low stress - take time to unwind and care for your mental health  Aim for 150 min of moderate intensity exercise weekly for heart health, and weights twice weekly for bone health  Aim for 7-9 hours of sleep daily       Osteoporosis  Osteoporosis is when the bones get thin and weak. This can cause your bones to break (fracture) more easily. What are the causes? The exact cause of this condition is not known. What  increases the risk?  Having family members with this condition.  Not eating enough healthy foods.  Taking certain medicines.  Being female.  Being age 61 or older.  Smoking or using other products that contain nicotine or tobacco, such as e-cigarettes or chewing tobacco.  Not exercising.  Being of European or Asian ancestry.  Having a small body frame. What are the signs or symptoms? A broken bone might be the first sign, especially if the break results from a fall or injury that usually would not cause a bone to break. Other signs and symptoms include:  Pain in the neck or low back.  Being hunched over (stooped posture).  Getting shorter. How is this treated?  Eating more foods with more calcium and vitamin D in them.  Doing exercises.  Stopping tobacco use.  Limiting how much alcohol you drink.  Taking medicines to slow bone loss or help make the bones stronger.  Taking supplements of calcium and vitamin D every day.  Taking medicines to replace chemicals in the body (hormone replacement medicines).  Monitoring your levels of calcium and vitamin D. The goal of treatment is to strengthen your bones and lower your risk for a bone break. Follow these instructions at home: Eating and drinking Eat  plenty of calcium and vitamin D. These nutrients are good for your bones. Good sources of calcium and vitamin D include:  Some fish, such as salmon and tuna.  Foods that have calcium and vitamin D added to them (fortified foods), such as some breakfast cereals.  Egg yolks.  Cheese.  Liver.   Activity Do exercises as told by your doctor. Ask your doctor what exercises are safe for you. You should do:  Exercises that make your muscles work to hold your body weight up (weight-bearing exercises). These include tai chi, yoga, and walking.  Exercises to make your muscles stronger. One example is lifting weights. Lifestyle  Do not drink alcohol if: ? Your doctor  tells you not to drink. ? You are pregnant, may be pregnant, or are planning to become pregnant.  If you drink alcohol: ? Limit how much you use to:  0-1 drink a day for women.  0-2 drinks a day for men.  Know how much alcohol is in your drink. In the [place] one drink equals one 12 oz bottle of beer (355 mL), one 5 oz glass of wine (148 mL), or one 1 oz glass of hard liquor (44 mL).  Do not smoke or use any products that contain nicotine or tobacco. If you need help quitting, ask your doctor. Preventing falls  Use tools to help you move around (mobility aids) as needed. These include canes, walkers, scooters, and crutches.  Keep rooms well-lit.  Put away things on the floor that could make you trip. These include cords and rugs.  Install safety rails on stairs. Install grab bars in bathrooms.  Use rubber mats in slippery areas, like bathrooms.  Wear shoes that: ? Fit you well. ? Support your feet. ? Have closed toes. ? Have rubber soles or low heels.  Tell your doctor about all of the medicines you are taking. Some medicines can make you more likely to fall. General instructions  Take over-the-counter and prescription medicines only as told by your doctor.  Keep all follow-up visits. Contact a doctor if:  You have not been tested (screened) for osteoporosis and you are: ? A woman who is age 74 or older. ? A man who is age 73 or older. Get help right away if:  You fall.  You get hurt. Summary  Osteoporosis happens when your bones get thin and weak.  Weak bones can break (fracture) more easily.  Eat plenty of calcium and vitamin D. These are good for your bones.  Tell your doctor about all of the medicines that you take. This information is not intended to replace advice given to you by your health care provider. Make sure you discuss any questions you have with your health care provider. Document Revised: 02/05/2020 Document Reviewed: 02/05/2020 Elsevier  Patient Education  [place].

## 2020-11-09 LAB — LIPID PANEL
Cholesterol: 161 mg/dL (ref <200)
HDL: 53 mg/dL (ref >=50)
LDL Cholesterol (Calc): 88 mg/dL (calc)
Non-HDL Cholesterol (Calc): 108 mg/dL (calc) (ref <130)
Total CHOL/HDL Ratio: 3 (calc) (ref <5.0)
Triglycerides: 100 mg/dL (ref <150)

## 2020-11-09 LAB — CBC WITH DIFFERENTIAL/PLATELET
Absolute Monocytes: 555 cells/uL (ref 200–950)
Basophils Absolute: 43 cells/uL (ref 0–200)
Basophils Relative: 0.7 %
Eosinophils Absolute: 49 cells/uL (ref 15–500)
Eosinophils Relative: 0.8 %
HCT: 40.7 % (ref 35.0–45.0)
Hemoglobin: 13.8 g/dL (ref 11.7–15.5)
Lymphs Abs: 1434 cells/uL (ref 850–3900)
MCH: 30.7 pg (ref 27.0–33.0)
MCHC: 33.9 g/dL (ref 32.0–36.0)
MCV: 90.6 fL (ref 80.0–100.0)
MPV: 10 fL (ref 7.5–12.5)
Monocytes Relative: 9.1 %
Neutro Abs: 4020 cells/uL (ref 1500–7800)
Neutrophils Relative %: 65.9 %
Platelets: 215 10*3/uL (ref 140–400)
RBC: 4.49 10*6/uL (ref 3.80–5.10)
RDW: 12.8 % (ref 11.0–15.0)
Total Lymphocyte: 23.5 %
WBC: 6.1 10*3/uL (ref 3.8–10.8)

## 2020-11-09 LAB — MICROALBUMIN / CREATININE URINE RATIO
Creatinine, Urine: 211 mg/dL (ref 20–275)
Microalb Creat Ratio: 10 mcg/mg creat (ref <30)
Microalb, Ur: 2.1 mg/dL

## 2020-11-09 LAB — HEMOGLOBIN A1C
Hgb A1c MFr Bld: 5.3 % of total Hgb (ref <5.7)
Mean Plasma Glucose: 105 mg/dL
eAG (mmol/L): 5.8 mmol/L

## 2020-11-09 LAB — COMPLETE METABOLIC PANEL WITH GFR
AG Ratio: 1.9 (calc) (ref 1.0–2.5)
ALT: 13 U/L (ref 6–29)
AST: 25 U/L (ref 10–35)
Albumin: 4.2 g/dL (ref 3.6–5.1)
Alkaline phosphatase (APISO): 76 U/L (ref 37–153)
BUN/Creatinine Ratio: 14 (calc) (ref 6–22)
BUN: 16 mg/dL (ref 7–25)
CO2: 28 mmol/L (ref 20–32)
Calcium: 9.6 mg/dL (ref 8.6–10.4)
Chloride: 107 mmol/L (ref 98–110)
Creat: 1.11 mg/dL — ABNORMAL HIGH (ref 0.60–0.93)
GFR, Est African American: 58 mL/min/{1.73_m2} — ABNORMAL LOW (ref >=60)
GFR, Est Non African American: 50 mL/min/{1.73_m2} — ABNORMAL LOW (ref >=60)
Globulin: 2.2 g/dL (calc) (ref 1.9–3.7)
Glucose, Bld: 74 mg/dL (ref 65–99)
Potassium: 3.8 mmol/L (ref 3.5–5.3)
Sodium: 144 mmol/L (ref 135–146)
Total Bilirubin: 0.4 mg/dL (ref 0.2–1.2)
Total Protein: 6.4 g/dL (ref 6.1–8.1)

## 2020-11-09 LAB — URINALYSIS, ROUTINE W REFLEX MICROSCOPIC
Bilirubin Urine: NEGATIVE
Glucose, UA: NEGATIVE
Hgb urine dipstick: NEGATIVE
Ketones, ur: NEGATIVE
Leukocytes,Ua: NEGATIVE
Nitrite: NEGATIVE
Protein, ur: NEGATIVE
Specific Gravity, Urine: 1.018 (ref 1.001–1.03)
pH: 6 (ref 5.0–8.0)

## 2020-11-09 LAB — VITAMIN D 25 HYDROXY (VIT D DEFICIENCY, FRACTURES): Vit D, 25-Hydroxy: 93 ng/mL (ref 30–100)

## 2020-11-09 LAB — TSH: TSH: 1.28 mIU/L (ref 0.40–4.50)

## 2020-11-09 LAB — MAGNESIUM: Magnesium: 2.5 mg/dL (ref 1.5–2.5)

## 2020-11-18 ENCOUNTER — Other Ambulatory Visit: Payer: Self-pay | Admitting: Internal Medicine

## 2020-11-19 ENCOUNTER — Ambulatory Visit
Admission: RE | Admit: 2020-11-19 | Discharge: 2020-11-19 | Disposition: A | Discharge status: Home / Self Care | Payer: PPO | Source: Ambulatory Visit | Attending: Adult Health | Admitting: Adult Health

## 2020-11-19 ENCOUNTER — Other Ambulatory Visit: Payer: Self-pay | Admitting: Internal Medicine

## 2020-11-19 DIAGNOSIS — E041 Nontoxic single thyroid nodule: Secondary | ICD-10-CM

## 2020-11-19 DIAGNOSIS — I10 Essential (primary) hypertension: Secondary | ICD-10-CM

## 2020-11-19 DIAGNOSIS — E782 Mixed hyperlipidemia: Secondary | ICD-10-CM

## 2020-11-19 MED ORDER — PRAVASTATIN SODIUM 40 MG PO TABS
ORAL_TABLET | ORAL | 0 refills | Status: DC
Start: 2020-11-19 — End: 2021-02-17

## 2020-11-22 ENCOUNTER — Other Ambulatory Visit: Payer: Self-pay | Admitting: Internal Medicine

## 2020-11-22 DIAGNOSIS — I10 Essential (primary) hypertension: Secondary | ICD-10-CM

## 2020-11-22 MED ORDER — DILTIAZEM HCL 120 MG PO TABS
ORAL_TABLET | ORAL | 1 refills | Status: DC
Start: 2020-11-22 — End: 2022-01-08

## 2020-11-23 DIAGNOSIS — M25561 Pain in right knee: Secondary | ICD-10-CM | POA: Diagnosis not present

## 2020-11-23 DIAGNOSIS — M5459 Other low back pain: Secondary | ICD-10-CM | POA: Diagnosis not present

## 2020-11-29 ENCOUNTER — Other Ambulatory Visit: Payer: Self-pay

## 2020-11-29 MED ORDER — MOUTHWASH COMPOUNDING BASE PO LIQD: 5.0000 mL | ORAL | 1 refills | Status: DC | PRN

## 2020-12-09 ENCOUNTER — Other Ambulatory Visit: Payer: Self-pay | Admitting: Internal Medicine

## 2020-12-13 ENCOUNTER — Telehealth: Payer: Self-pay | Admitting: *Deleted

## 2020-12-13 ENCOUNTER — Other Ambulatory Visit: Payer: Self-pay | Admitting: Internal Medicine

## 2020-12-13 MED ORDER — AZITHROMYCIN 250 MG PO TABS: ORAL_TABLET | ORAL | 0 refills | Status: DC

## 2020-12-13 MED ORDER — DEXAMETHASONE 4 MG PO TABS: ORAL_TABLET | ORAL | 0 refills | Status: DC

## 2020-12-13 NOTE — Telephone Encounter (Signed)
Returned call to patient regarding cough, wheezing sinus pressure and headache. Dr Melford Aase sent in a Z-pak and Dexamethasone to the patient's pharmacy. He advised OTC Sudafed PE. Patient is aware.

## 2020-12-16 ENCOUNTER — Telehealth: Payer: Self-pay

## 2020-12-16 NOTE — Telephone Encounter (Signed)
Patient advised about going to UC. States that she will wait until we can see her in the office.

## 2020-12-16 NOTE — Telephone Encounter (Signed)
States that she is having pain in her side. Front desk advised to go to Urgent Care due to not having any appointments available and office is closed tomorrow.  Please advise.

## 2020-12-18 DIAGNOSIS — R1032 Left lower quadrant pain: Secondary | ICD-10-CM | POA: Diagnosis not present

## 2020-12-18 DIAGNOSIS — N3001 Acute cystitis with hematuria: Secondary | ICD-10-CM | POA: Diagnosis not present

## 2020-12-18 DIAGNOSIS — R1012 Left upper quadrant pain: Secondary | ICD-10-CM | POA: Diagnosis not present

## 2020-12-18 DIAGNOSIS — R109 Unspecified abdominal pain: Secondary | ICD-10-CM | POA: Diagnosis not present

## 2020-12-21 ENCOUNTER — Telehealth: Payer: Self-pay

## 2020-12-21 NOTE — Telephone Encounter (Signed)
Patient walked in the office and presented with left side flank pain. Was seen at [facility] and advised her to go the [facility] ER. Patient declined. Unable to be seen in our office due to no appointments available. And only having one provider here today. Advised patient to go to the ER on [address].

## 2020-12-23 ENCOUNTER — Emergency Department (HOSPITAL_COMMUNITY): Payer: Medicare Other

## 2020-12-23 ENCOUNTER — Emergency Department (HOSPITAL_COMMUNITY)
Admission: EM | Admit: 2020-12-23 | Discharge: 2020-12-23 | Disposition: A | Discharge status: Home / Self Care | Payer: Medicare Other | Attending: Emergency Medicine | Admitting: Emergency Medicine

## 2020-12-23 ENCOUNTER — Other Ambulatory Visit: Payer: Self-pay

## 2020-12-23 DIAGNOSIS — Z79899 Other long term (current) drug therapy: Secondary | ICD-10-CM | POA: Insufficient documentation

## 2020-12-23 DIAGNOSIS — R109 Unspecified abdominal pain: Secondary | ICD-10-CM | POA: Diagnosis not present

## 2020-12-23 DIAGNOSIS — K449 Diaphragmatic hernia without obstruction or gangrene: Secondary | ICD-10-CM | POA: Diagnosis not present

## 2020-12-23 DIAGNOSIS — K5900 Constipation, unspecified: Secondary | ICD-10-CM

## 2020-12-23 DIAGNOSIS — K219 Gastro-esophageal reflux disease without esophagitis: Secondary | ICD-10-CM | POA: Diagnosis not present

## 2020-12-23 DIAGNOSIS — I129 Hypertensive chronic kidney disease with stage 1 through stage 4 chronic kidney disease, or unspecified chronic kidney disease: Secondary | ICD-10-CM | POA: Diagnosis not present

## 2020-12-23 DIAGNOSIS — Z7982 Long term (current) use of aspirin: Secondary | ICD-10-CM | POA: Insufficient documentation

## 2020-12-23 DIAGNOSIS — Z7722 Contact with and (suspected) exposure to environmental tobacco smoke (acute) (chronic): Secondary | ICD-10-CM | POA: Diagnosis not present

## 2020-12-23 DIAGNOSIS — N183 Chronic kidney disease, stage 3 unspecified: Secondary | ICD-10-CM | POA: Diagnosis not present

## 2020-12-23 DIAGNOSIS — M47816 Spondylosis without myelopathy or radiculopathy, lumbar region: Secondary | ICD-10-CM | POA: Diagnosis not present

## 2020-12-23 LAB — BASIC METABOLIC PANEL
Anion gap: 5 (ref 5–15)
BUN: 16 mg/dL (ref 8–23)
CO2: 28 mmol/L (ref 22–32)
Calcium: 9.1 mg/dL (ref 8.9–10.3)
Chloride: 108 mmol/L (ref 98–111)
Creatinine, Ser: 1.03 mg/dL — ABNORMAL HIGH (ref 0.44–1.00)
GFR, Estimated: 58 mL/min — ABNORMAL LOW (ref >60)
Glucose, Bld: 67 mg/dL — ABNORMAL LOW (ref 70–99)
Potassium: 4.1 mmol/L (ref 3.5–5.1)
Sodium: 141 mmol/L (ref 135–145)

## 2020-12-23 LAB — CBC WITH DIFFERENTIAL/PLATELET
Abs Immature Granulocytes: 0.05 10*3/uL (ref 0.00–0.07)
Basophils Absolute: 0 10*3/uL (ref 0.0–0.1)
Basophils Relative: 1 %
Eosinophils Absolute: 0.2 10*3/uL (ref 0.0–0.5)
Eosinophils Relative: 2 %
HCT: 42 % (ref 36.0–46.0)
Hemoglobin: 13.5 g/dL (ref 12.0–15.0)
Immature Granulocytes: 1 %
Lymphocytes Relative: 19 %
Lymphs Abs: 1.3 10*3/uL (ref 0.7–4.0)
MCH: 30.1 pg (ref 26.0–34.0)
MCHC: 32.1 g/dL (ref 30.0–36.0)
MCV: 93.8 fL (ref 80.0–100.0)
Monocytes Absolute: 0.7 10*3/uL (ref 0.1–1.0)
Monocytes Relative: 10 %
Neutro Abs: 5 10*3/uL (ref 1.7–7.7)
Neutrophils Relative %: 67 %
Platelets: 211 10*3/uL (ref 150–400)
RBC: 4.48 MIL/uL (ref 3.87–5.11)
RDW: 12.8 % (ref 11.5–15.5)
WBC: 7.3 10*3/uL (ref 4.0–10.5)
nRBC: 0 % (ref 0.0–0.2)

## 2020-12-23 LAB — URINALYSIS, ROUTINE W REFLEX MICROSCOPIC
Bilirubin Urine: NEGATIVE
Glucose, UA: NEGATIVE mg/dL
Hgb urine dipstick: NEGATIVE
Ketones, ur: NEGATIVE mg/dL
Leukocytes,Ua: NEGATIVE
Nitrite: NEGATIVE
Protein, ur: NEGATIVE mg/dL
Specific Gravity, Urine: 1.016 (ref 1.005–1.030)
pH: 6 (ref 5.0–8.0)

## 2020-12-23 LAB — CBG MONITORING, ED: Glucose-Capillary: 82 mg/dL (ref 70–99)

## 2020-12-23 MED ORDER — POLYETHYLENE GLYCOL 3350 17 GM/SCOOP PO POWD: 17.0000 g | Freq: Every day | ORAL | 0 refills | Status: DC

## 2020-12-23 NOTE — Discharge Instructions (Signed)
Please read and follow all provided instructions.  Your diagnoses today include:  1. Acute flank pain   2. Constipation, unspecified constipation type     Tests performed today include:  Blood cell counts and platelets  Kidney and liver function tests  Pancreas function test (called lipase)  Urine test to look for infection  CT scan shows constipation but does not show any concerning causes of abdominal pain inside your abdomen  Vital signs. See below for your results today.   Medications prescribed:   Miralax - laxative  This medication can be found over-the-counter.   Take any prescribed medications only as directed.  Home care instructions:   Follow any educational materials contained in this packet.  Follow-up instructions: Please follow-up with your primary care provider in the next 3 days for further evaluation of your symptoms.    Return instructions:  SEEK IMMEDIATE MEDICAL ATTENTION IF:  The pain does not go away or becomes severe   A temperature above 101F develops   Repeated vomiting occurs (multiple episodes)   The pain becomes localized to portions of the abdomen. The right side could possibly be appendicitis. In an adult, the left lower portion of the abdomen could be colitis or diverticulitis.   Blood is being passed in stools or vomit (bright red or black tarry stools)   You develop chest pain, difficulty breathing, dizziness or fainting, or become confused, poorly responsive, or inconsolable (young children)  If you have any other emergent concerns regarding your health  Additional Information: Abdominal (belly) pain can be caused by many things. Your caregiver performed an examination and possibly ordered blood/urine tests and imaging (CT scan, x-rays, ultrasound). Many cases can be observed and treated at home after initial evaluation in the emergency department. Even though you are being discharged home, abdominal pain can be unpredictable.  Therefore, you need a repeated exam if your pain does not resolve, returns, or worsens. Most patients with abdominal pain don't have to be admitted to the hospital or have surgery, but serious problems like appendicitis and gallbladder attacks can start out as nonspecific pain. Many abdominal conditions cannot be diagnosed in one visit, so follow-up evaluations are very important.  Your vital signs today were: BP (!) 179/87 (BP Location: Left Arm)   Pulse 69   Temp 97.6 F (36.4 C) (Oral)   Resp (!) 22   SpO2 97%  If your blood pressure (bp) was elevated above 135/85 this visit, please have this repeated by your doctor within one month. --------------

## 2020-12-23 NOTE — ED Triage Notes (Signed)
Emergency Medicine Provider Triage Evaluation Note  HERMILA MILLIS , a 70 y.o. female  was evaluated in triage.  Pt complains of left back to LLQ pain, onset 1 week ago, saw PCP and told to go to ER then but didn't. No falls or injuries, reports dark urine from working in garden and not drinking enough water .  Review of Systems  Positive: Flank pain  Negative: Changes in bowel or bladder habits  Physical Exam  There were no vitals taken for this visit. Gen:   Awake, no distress   HEENT:  Atraumatic  Resp:  Normal effort  Cardiac:  Normal rate  Abd:   Nondistended, nontender  MSK:   Moves extremities without difficulty, no CVA tenderness Neuro:  Speech clear   Medical Decision Making  Medically screening exam initiated at 12:42 PM.  Appropriate orders placed.  RAYAN DYAL was informed that the remainder of the evaluation will be completed by another provider, this initial triage assessment does not replace that evaluation, and the importance of remaining in the ED until their evaluation is complete.  Clinical Impression     Tacy Learn, PA-C 12/23/20 1304

## 2020-12-23 NOTE — ED Provider Notes (Signed)
[facility] EMERGENCY DEPARTMENT Provider Note   CSN: [id number] Arrival date & time: 12/23/20  1230     History Chief Complaint  Patient presents with  . Flank Pain    Latoya [name] is a 70 y.o. female.  Patient presents the emergency department for evaluation of left-sided flank pain that is worse with movement and positions.  Symptoms started over a week ago and then gradually became worse.  She denies associated fevers, chest pain or shortness of breath.  She denies nausea, vomiting, diarrhea or constipation.  She denies dysuria, hematuria, increased frequency or urgency.  She has been applying topical medications without improvement.  History of abdominal hysterectomy.  Denies injury prior to onset of symptoms.  She has a history of kidney stones but states this feels different.  Pain is severe when she tries to get up.  No history of lower back problems and current pain does not radiate into her legs.  The onset of this condition was acute. The course is constant and worsening.         Past Medical History:  Diagnosis Date  . Anxiety and depression   . Arthritis   . Colon polyps   . GERD (gastroesophageal reflux disease)   . Hemorrhoids   . Hiatal hernia   . History of kidney stones    passed  . Hyperlipemia   . Mixed hyperlipidemia 11/26/2013  . Panic disorder   . Passive smoke exposure 07/17/2018  . Renal insufficiency    stage 3 kidney disease per her PCP  . Stomach ulcer   . Unspecified essential hypertension 11/26/2013    Patient Active Problem List   Diagnosis Date Noted  . Tenderness of both temporomandibular joints 10/13/2020  . 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 Recurrent acute serous otitis media of right ear 05/19/2020  . History of eustachian tube dysfunction 05/19/2020  . Osteoporosis 06/23/2019  . FHx: heart disease 11/27/2017  . 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 Laryngopharyngeal reflux (LPR) 10/12/2016  . Chronic  rhinitis 10/09/2016  . Hiatal hernia 07/14/2016  . Aortic atherosclerosis ([place]) 06/30/2016  . CKD (chronic kidney disease) stage 3, GFR 30-59 ml/min (HCC) 06/30/2016  . Thyroid nodule 12/24/2015  . Abnormal glucose 05/01/2014  . Vitamin D deficiency 05/01/2014  . Medication management 05/01/2014  . Essential hypertension 11/26/2013  . Hyperlipidemia, mixed 11/26/2013  . Chronic cough 09/19/2013  . Generalized anxiety disorder 07/11/2013  . Esophageal reflux 11/29/2012    Past Surgical History:  Procedure Laterality Date  . ABDOMINAL HYSTERECTOMY    . CARPOMETACARPEL SUSPENSION PLASTY Right 12/08/2013   Procedure: CARPOMETACARPEL [facility]) SUSPENSION PLASTY;  Surgeon: Jolyn Nap, MD;  Location: [facility];  Service: Orthopedics;  Laterality: Right;  . CATARACT EXTRACTION Right 2020   Dr. Pandora Leiter   . COLONOSCOPY    . FOOT SURGERY Left 2008   Bunion and pulled tendons  . HAND SURGERY Right    Dog Bite  . I & D EXTREMITY Right 01/20/2017   Procedure: RIGHT HAND IRRIGATION AND DEBRIDEMENT AND REPAIR AS INDICATED;  Surgeon: Iran Planas, MD;  Location: [facility];  Service: Orthopedics;  Laterality: Right;  . OOPHORECTOMY Right   . TUBAL LIGATION    . UPPER GASTROINTESTINAL ENDOSCOPY       OB History   No obstetric history on file.     Family History  Problem Relation Age of Onset  . Heart disease Brother  x2  . Pancreatic cancer Brother   . Dementia Brother   . Heart attack Brother        40s-50s, was on drugs  . Heart disease Father   . Asthma Mother   . Heart Problems Maternal Grandfather   . CVA Brother   . Other Brother        "lots of health problems" - unsure specifics  . Tuberculosis Daughter   . Colon cancer Neg Hx   . Stomach cancer Neg Hx   . Breast cancer Neg Hx     Social History   Tobacco Use  . Smoking status: Passive Smoke Exposure - Never Smoker  . Smokeless tobacco: Never Used  . Tobacco comment: Husband & Father smoked  Vaping  Use  . Vaping Use: Never used  Substance Use Topics  . Alcohol use: No    Alcohol/week: 0.0 standard drinks  . Drug use: No    Home Medications Prior to Admission medications   Medication Sig Start Date End Date Taking? Authorizing Provider  ALPRAZolam Duanne Moron) 0.5 MG tablet Take     1/2  to 1 tablet        2 to 3 x /day         as needed for Nerves 08/23/20   Unk Pinto, MD  aspirin 81 MG tablet Take 81 mg by mouth at bedtime.     [provider]  azithromycin (ZITHROMAX) 250 MG tablet Take 2 tablets with Food on  Day 1, then 1 tablet Daily with Food for Sinusitis / Bronchitis 12/13/20   Unk Pinto, MD  cholecalciferol (VITAMIN D) 1000 units tablet Take 10,000 Units by mouth daily.    [provider]  dexamethasone (DECADRON) 4 MG tablet Take 1 tab 3 x /day for 2 days,      then 2 x /day for 2  Days,     then 1 tab daily 12/13/20   Unk Pinto, MD  diltiazem (CARDIZEM) 120 MG tablet Take  1 tablet  2 x /day (every 12 hours)  for BP 11/22/20   Unk Pinto, MD  famotidine (PEPCID) 20 MG tablet 20 mg daily. As needed.    [provider]  fluticasone (FLONASE) 50 MCG/ACT nasal spray Place 1-2 sprays into both nostrils daily as needed for allergies or rhinitis. Patient not taking: Reported on 11/08/2020 10/13/20   [name], NP  furosemide (LASIX) 20 MG tablet Take 1 tablet (20 mg total) by mouth daily as needed for edema. 01/21/19   Vladimir Crofts, PA-C  Mouthwash Compounding Base LIQD Take 5 mLs by mouth every 2 (two) hours as needed. 11/29/20   [name], NP  potassium chloride SA (KLOR-CON) 20 MEQ tablet TAKE 1 TABLET BY MOUTH TWICE DAILY FOR POTASSIUM 11/18/20   [name], NP  pravastatin (PRAVACHOL) 40 MG tablet Take  1 tablet  at Bedtime  for Cholesterol 11/19/20   Unk Pinto, MD  propranolol (INDERAL) 10 MG tablet TAKE 1 TABLET TWICE DAILY FOR ANXIETY 05/23/20   Unk Pinto, MD  sertraline (ZOLOFT) 100 MG tablet TAKE 1  TABLET BY MOUTH DAILY FOR MOOD WITH FOOD 12/10/20   [name], NP    Allergies    Other, Prednisone, Vioxx [rofecoxib], Entex t [pseudoephedrine-guaifenesin], Levaquin [levofloxacin], Sulfa antibiotics, and Trovan [alatrofloxacin]  Review of Systems   Review of Systems  Constitutional: Negative for fever.  HENT: Negative for rhinorrhea and sore throat.   Eyes: Negative for redness.  Respiratory: Negative for cough  and shortness of breath.   Cardiovascular: Negative for chest pain.  Gastrointestinal: Negative for abdominal pain, diarrhea, nausea and vomiting.  Genitourinary: Positive for flank pain. Negative for dysuria, frequency, hematuria, urgency, vaginal bleeding and vaginal discharge.  Musculoskeletal: Negative for myalgias.  Skin: Negative for rash.  Neurological: Negative for headaches.    Physical Exam Updated Vital Signs BP (!) 179/87 (BP Location: Left Arm)   Pulse 69   Temp 97.6 F (36.4 C) (Oral)   Resp (!) 22   SpO2 97%   Physical Exam Vitals and nursing note reviewed.  Constitutional:      General: She is not in acute distress.    Appearance: She is well-developed.  HENT:     Head: Normocephalic and atraumatic.     Right Ear: External ear normal.     Left Ear: External ear normal.     Nose: Nose normal.  Eyes:     Conjunctiva/sclera: Conjunctivae normal.  Cardiovascular:     Rate and Rhythm: Normal rate and regular rhythm.     Heart sounds: No murmur heard.   Pulmonary:     Effort: No respiratory distress.     Breath sounds: No wheezing, rhonchi or rales.  Abdominal:     Palpations: Abdomen is soft.     Tenderness: There is no abdominal tenderness. There is no guarding or rebound.     Comments: Anterior abdomen is soft and nontender.  Musculoskeletal:     Cervical back: Normal range of motion and neck supple.     Right lower leg: No edema.     Left lower leg: No edema.     Comments: Patient with tenderness over the left lower flank and  lateral portion of the lower back.  Skin:    General: Skin is warm and dry.     Findings: No rash.  Neurological:     General: No focal deficit present.     Mental Status: She is alert. Mental status is at baseline.     Motor: No weakness.  Psychiatric:        Mood and Affect: Mood normal.     ED Results / Procedures / Treatments   Labs (all labs ordered are listed, but only abnormal results are displayed) Labs Reviewed  BASIC METABOLIC PANEL - Abnormal; Notable for the following components:      Result Value   Glucose, Bld 67 (*)    Creatinine, Ser 1.03 (*)    GFR, Estimated 58 (*)    All other components within normal limits  CBC WITH DIFFERENTIAL/PLATELET  URINALYSIS, ROUTINE W REFLEX MICROSCOPIC  CBG MONITORING, ED    EKG None  Radiology CT Renal Stone Study  Result Date: 12/23/2020 CLINICAL DATA:  Left flank pain for 1 week. EXAM: CT ABDOMEN AND PELVIS WITHOUT CONTRAST TECHNIQUE: Multidetector CT imaging of the abdomen and pelvis was performed following the standard protocol without IV contrast. COMPARISON:  07/01/2015 FINDINGS: Lower chest: The lung bases are clear of acute process. No pleural effusion or pulmonary lesions. The heart is normal in size. No pericardial effusion. There is a large hiatal hernia. Hepatobiliary: No hepatic lesions are identified without contrast. The gallbladder is unremarkable. No common bile duct dilatation. Pancreas: No mass, inflammation or ductal dilatation. Spleen: Normal size.  No focal lesions. Adrenals/Urinary Tract: Adrenal glands are normal. No renal, ureteral or bladder calculi or mass is identified without contrast. Stomach/Bowel: The stomach, duodenum, small bowel and colon are grossly normal without oral contrast. No acute inflammatory  changes, mass lesions or obstructive findings. The appendix is not identified and is likely surgically absent. There is moderate stool throughout the colon and down into the rectum which may suggest  constipation. Vascular/Lymphatic: The aorta is normal in caliber. No dissection. The branch vessels are patent. The major venous structures are patent. No mesenteric or retroperitoneal mass or adenopathy. Small scattered lymph nodes are noted. Reproductive: The uterus is surgically absent. I do not see the left ovary for certain. The right ovary is still present and appears normal. Other: No pelvic mass or adenopathy. No free pelvic fluid collections. No inguinal mass or adenopathy. No abdominal wall hernia or subcutaneous lesions. Musculoskeletal: No acute bony findings. Stable degenerative lumbar spondylosis. IMPRESSION: 1. No acute abdominal/pelvic findings, mass lesions or adenopathy. 2. No renal, ureteral or bladder calculi or mass. 3. Large hiatal hernia. 4. Moderate stool throughout the colon and down into the rectum may suggest constipation. Electronically Signed   By: Marijo Sanes M.D.   On: 12/23/2020 18:02    Procedures Procedures   Medications Ordered in ED Medications - No data to display  ED Course  I have reviewed the triage vital signs and the nursing notes.  Pertinent labs & imaging results that were available during my care of the patient were reviewed by me and considered in my medical decision making (see chart for details).  Patient seen and examined. Work-up initiated.  Labs are reassuring.  Pending UA.  Will obtain renal protocol CT.  Vital signs reviewed and are as follows: BP (!) 179/87 (BP Location: Left Arm)   Pulse 69   Temp 97.6 F (36.4 C) (Oral)   Resp (!) 22   SpO2 97%    6:54 PM CT personally reviewed.  Shows moderate constipation, otherwise no concerning findings.  Patient updated on results.  Will start on trial of MiraLAX to see if this helps her pain.  Otherwise offered muscle relaxer, patient declined stating that she thinks she has some at home.  Plan for discharged home at this time.  The patient was urged to return to the Emergency Department  immediately with worsening of current symptoms, worsening abdominal pain, persistent vomiting, blood noted in stools, fever, or any other concerns. The patient verbalized understanding.   Patient is anxious to leave because she does not want to drive while it is dark.    MDM Rules/Calculators/A&P                          Patient with abdominal pain. Vitals are stable, no fever. Labs reassuring without any concerning abnormalities. Imaging shows constipation without any concerning causes of pain. No signs of dehydration, patient is tolerating PO's. Lungs are clear and no signs suggestive of PNA. Low concern for appendicitis, cholecystitis, pancreatitis, ruptured viscus, UTI, kidney stone, aortic dissection, aortic aneurysm or other emergent abdominal etiology. Supportive therapy indicated with return if symptoms worsen.    Final Clinical Impression(s) / ED Diagnoses Final diagnoses:  Acute flank pain  Constipation, unspecified constipation type    Rx / DC Orders ED Discharge Orders         Ordered    polyethylene glycol powder (GLYCOLAX/MIRALAX) 17 GM/SCOOP powder  Daily        12/23/20 1853           Carlisle Cater, PA-C 12/23/20 1856    Valarie Merino, MD 12/23/20 [phone]

## 2020-12-23 NOTE — ED Notes (Signed)
Patient Alert and oriented to baseline. Stable and ambulatory to baseline. Patient verbalized understanding of the discharge instructions.  Patient belongings were taken by the patient.   

## 2020-12-23 NOTE — ED Triage Notes (Signed)
Pt presents to the ED with left sided flank pain that radiates to the front. Pt states her urine is more yellow than usual.

## 2020-12-25 ENCOUNTER — Other Ambulatory Visit: Payer: Self-pay

## 2020-12-25 ENCOUNTER — Emergency Department (HOSPITAL_BASED_OUTPATIENT_CLINIC_OR_DEPARTMENT_OTHER)
Admission: EM | Admit: 2020-12-25 | Discharge: 2020-12-26 | Disposition: A | Discharge status: Home / Self Care | Payer: Medicare Other | Attending: Emergency Medicine | Admitting: Emergency Medicine

## 2020-12-25 DIAGNOSIS — N183 Chronic kidney disease, stage 3 unspecified: Secondary | ICD-10-CM | POA: Diagnosis not present

## 2020-12-25 DIAGNOSIS — Z79899 Other long term (current) drug therapy: Secondary | ICD-10-CM | POA: Diagnosis not present

## 2020-12-25 DIAGNOSIS — Z96691 Finger-joint replacement of right hand: Secondary | ICD-10-CM | POA: Insufficient documentation

## 2020-12-25 DIAGNOSIS — M545 Low back pain, unspecified: Secondary | ICD-10-CM | POA: Diagnosis not present

## 2020-12-25 DIAGNOSIS — Z87442 Personal history of urinary calculi: Secondary | ICD-10-CM | POA: Diagnosis not present

## 2020-12-25 DIAGNOSIS — I129 Hypertensive chronic kidney disease with stage 1 through stage 4 chronic kidney disease, or unspecified chronic kidney disease: Secondary | ICD-10-CM | POA: Insufficient documentation

## 2020-12-25 DIAGNOSIS — Z7722 Contact with and (suspected) exposure to environmental tobacco smoke (acute) (chronic): Secondary | ICD-10-CM | POA: Insufficient documentation

## 2020-12-25 DIAGNOSIS — Z7982 Long term (current) use of aspirin: Secondary | ICD-10-CM | POA: Insufficient documentation

## 2020-12-25 DIAGNOSIS — R109 Unspecified abdominal pain: Secondary | ICD-10-CM | POA: Diagnosis present

## 2020-12-25 DIAGNOSIS — R6889 Other general symptoms and signs: Secondary | ICD-10-CM | POA: Diagnosis not present

## 2020-12-25 DIAGNOSIS — M62838 Other muscle spasm: Secondary | ICD-10-CM

## 2020-12-25 DIAGNOSIS — Z743 Need for continuous supervision: Secondary | ICD-10-CM | POA: Diagnosis not present

## 2020-12-25 DIAGNOSIS — M549 Dorsalgia, unspecified: Secondary | ICD-10-CM | POA: Diagnosis not present

## 2020-12-25 DIAGNOSIS — M62830 Muscle spasm of back: Secondary | ICD-10-CM | POA: Diagnosis not present

## 2020-12-25 LAB — CBC WITH DIFFERENTIAL/PLATELET
Abs Immature Granulocytes: 0.04 10*3/uL (ref 0.00–0.07)
Basophils Absolute: 0 10*3/uL (ref 0.0–0.1)
Basophils Relative: 1 %
Eosinophils Absolute: 0.1 10*3/uL (ref 0.0–0.5)
Eosinophils Relative: 1 %
HCT: 38.4 % (ref 36.0–46.0)
Hemoglobin: 12.9 g/dL (ref 12.0–15.0)
Immature Granulocytes: 1 %
Lymphocytes Relative: 24 %
Lymphs Abs: 2.1 10*3/uL (ref 0.7–4.0)
MCH: 30.6 pg (ref 26.0–34.0)
MCHC: 33.6 g/dL (ref 30.0–36.0)
MCV: 91 fL (ref 80.0–100.0)
Monocytes Absolute: 0.7 10*3/uL (ref 0.1–1.0)
Monocytes Relative: 8 %
Neutro Abs: 5.9 10*3/uL (ref 1.7–7.7)
Neutrophils Relative %: 65 %
Platelets: 205 10*3/uL (ref 150–400)
RBC: 4.22 MIL/uL (ref 3.87–5.11)
RDW: 13.1 % (ref 11.5–15.5)
WBC: 8.9 10*3/uL (ref 4.0–10.5)
nRBC: 0 % (ref 0.0–0.2)

## 2020-12-25 MED ORDER — KETOROLAC TROMETHAMINE 30 MG/ML IJ SOLN
15.0000 mg | Freq: Once | INTRAMUSCULAR | Status: AC
  Administered 2020-12-25: 15 mg via INTRAVENOUS
  Filled 2020-12-25: qty 1

## 2020-12-25 MED ORDER — DIAZEPAM 5 MG PO TABS
5.0000 mg | ORAL_TABLET | Freq: Once | ORAL | Status: AC
  Administered 2020-12-25: 5 mg via ORAL
  Filled 2020-12-25: qty 1

## 2020-12-25 NOTE — ED Triage Notes (Signed)
Pt to ED from home with c/o left sided flank pain. Pt was recently seen for same complaint where pt was diagnosed with constipation and sent home on laxatives. Pt reports having BM but still having pain.

## 2020-12-26 LAB — COMPREHENSIVE METABOLIC PANEL
ALT: 17 U/L (ref 0–44)
AST: 24 U/L (ref 15–41)
Albumin: 3.9 g/dL (ref 3.5–5.0)
Alkaline Phosphatase: 59 U/L (ref 38–126)
Anion gap: 10 (ref 5–15)
BUN: 23 mg/dL (ref 8–23)
CO2: 23 mmol/L (ref 22–32)
Calcium: 8.8 mg/dL — ABNORMAL LOW (ref 8.9–10.3)
Chloride: 107 mmol/L (ref 98–111)
Creatinine, Ser: 0.98 mg/dL (ref 0.44–1.00)
GFR, Estimated: >60 mL/min (ref >60)
Glucose, Bld: 112 mg/dL — ABNORMAL HIGH (ref 70–99)
Potassium: 3.3 mmol/L — ABNORMAL LOW (ref 3.5–5.1)
Sodium: 140 mmol/L (ref 135–145)
Total Bilirubin: 0.3 mg/dL (ref 0.3–1.2)
Total Protein: 6.1 g/dL — ABNORMAL LOW (ref 6.5–8.1)

## 2020-12-26 LAB — URINALYSIS, ROUTINE W REFLEX MICROSCOPIC
Bilirubin Urine: NEGATIVE
Glucose, UA: NEGATIVE mg/dL
Hgb urine dipstick: NEGATIVE
Ketones, ur: NEGATIVE mg/dL
Leukocytes,Ua: NEGATIVE
Nitrite: NEGATIVE
Protein, ur: NEGATIVE mg/dL
Specific Gravity, Urine: <1.005 — ABNORMAL LOW (ref 1.005–1.030)
pH: 6.5 (ref 5.0–8.0)

## 2020-12-26 MED ORDER — DIAZEPAM 5 MG PO TABS: 2.5000 mg | ORAL_TABLET | Freq: Three times a day (TID) | ORAL | 0 refills | Status: DC | PRN

## 2020-12-26 MED ORDER — MELOXICAM 15 MG PO TABS: 15.0000 mg | ORAL_TABLET | Freq: Every day | ORAL | 0 refills | Status: DC

## 2020-12-26 NOTE — ED Provider Notes (Signed)
[facility]ERG[facility]Y DEPT Provider Note   CSN: [id number] Arrival date & time: 12/25/20  2311     History Chief Complaint  Patient presents with  . Flank Pain    Latoya [name] is a 70 y.o. female.  70 yo F w/ multiple past medical problems that presents the emergency department today for persistent left flank pain.  Patient states she has had left flank pain for probably 3 to 4 days.  She was seen in the emergency room a few days ago and was a CT stone study was done as she does have a history of kidney stones but this was unremarkable.  She states that her pain has not gotten any better and in fact is gotten worse.  She states is significantly worse today and is why she came back in.  No new symptoms.  She has no nausea, vomiting, diarrhea.  She states she was constipated before which was resolved with the MiraLAX but the pain just seems to gotten worse.  She describes it as sharp left-sided flank.  Worse with certain twisting and other motions.  No urinary symptoms.  No rash.  She has never had shingles.   Flank Pain       Past Medical History:  Diagnosis Date  . Anxiety and depression   . Arthritis   . Colon polyps   . GERD (gastroesophageal reflux disease)   . Hemorrhoids   . Hiatal hernia   . History of kidney stones    passed  . Hyperlipemia   . Mixed hyperlipidemia 11/26/2013  . Panic disorder   . Passive smoke exposure 07/17/2018  . Renal insufficiency    stage 3 kidney disease per her PCP  . Stomach ulcer   . Unspecified essential hypertension 11/26/2013    Patient Active Problem List   Diagnosis Date Noted  . Tenderness of both temporomandibular joints 10/13/2020  . 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 Recurrent acute serous otitis media of right ear 05/19/2020  . History of eustachian tube dysfunction 05/19/2020  . Osteoporosis 06/23/2019  . FHx: heart disease 11/27/2017  . 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 Laryngopharyngeal  reflux (LPR) 10/12/2016  . Chronic rhinitis 10/09/2016  . Hiatal hernia 07/14/2016  . Aortic atherosclerosis ([place]) 06/30/2016  . CKD (chronic kidney disease) stage 3, GFR 30-59 ml/min (HCC) 06/30/2016  . Thyroid nodule 12/24/2015  . Abnormal glucose 05/01/2014  . Vitamin D deficiency 05/01/2014  . Medication management 05/01/2014  . Essential hypertension 11/26/2013  . Hyperlipidemia, mixed 11/26/2013  . Chronic cough 09/19/2013  . Generalized anxiety disorder 07/11/2013  . Esophageal reflux 11/29/2012    Past Surgical History:  Procedure Laterality Date  . ABDOMINAL HYSTERECTOMY    . CARPOMETACARPEL SUSPENSION PLASTY Right 12/08/2013   Procedure: CARPOMETACARPEL [facility]) SUSPENSION PLASTY;  Surgeon: Jolyn Nap, MD;  Location: [facility];  Service: Orthopedics;  Laterality: Right;  . CATARACT EXTRACTION Right 2020   Dr. Pandora Leiter   . COLONOSCOPY    . FOOT SURGERY Left 2008   Bunion and pulled tendons  . HAND SURGERY Right    Dog Bite  . I & D EXTREMITY Right 01/20/2017   Procedure: RIGHT HAND IRRIGATION AND DEBRIDEMENT AND REPAIR AS INDICATED;  Surgeon: Iran Planas, MD;  Location: [facility];  Service: Orthopedics;  Laterality: Right;  . OOPHORECTOMY Right   . TUBAL LIGATION    . UPPER GASTROINTESTINAL ENDOSCOPY       OB History   No obstetric history  on file.     Family History  Problem Relation Age of Onset  . Heart disease Brother        x2  . Pancreatic cancer Brother   . Dementia Brother   . Heart attack Brother        40s-50s, was on drugs  . Heart disease Father   . Asthma Mother   . Heart Problems Maternal Grandfather   . CVA Brother   . Other Brother        "lots of health problems" - unsure specifics  . Tuberculosis Daughter   . Colon cancer Neg Hx   . Stomach cancer Neg Hx   . Breast cancer Neg Hx     Social History   Tobacco Use  . Smoking status: Passive Smoke Exposure - Never Smoker  . Smokeless tobacco: Never Used  . Tobacco  comment: Husband & Father smoked  Vaping Use  . Vaping Use: Never used  Substance Use Topics  . Alcohol use: No    Alcohol/week: 0.0 standard drinks  . Drug use: No    Home Medications Prior to Admission medications   Medication Sig Start Date End Date Taking? Authorizing Provider  diazepam (VALIUM) 5 MG tablet Take 0.5-1 tablets (2.5-5 mg total) by mouth every 8 (eight) hours as needed for anxiety (spasms). 12/26/20  Yes Kiora Hallberg, Corene Cornea, MD  meloxicam (MOBIC) 15 MG tablet Take 1 tablet (15 mg total) by mouth daily. 12/26/20  Yes Arbie Blankley, Corene Cornea, MD  ALPRAZolam Duanne Moron) 0.5 MG tablet Take     1/2  to 1 tablet        2 to 3 x /day         as needed for Nerves 08/23/20   Unk Pinto, MD  aspirin 81 MG tablet Take 81 mg by mouth at bedtime.     [provider]  azithromycin (ZITHROMAX) 250 MG tablet Take 2 tablets with Food on  Day 1, then 1 tablet Daily with Food for Sinusitis / Bronchitis 12/13/20   Unk Pinto, MD  cholecalciferol (VITAMIN D) 1000 units tablet Take 10,000 Units by mouth daily.    [provider]  dexamethasone (DECADRON) 4 MG tablet Take 1 tab 3 x /day for 2 days,      then 2 x /day for 2  Days,     then 1 tab daily 12/13/20   Unk Pinto, MD  diltiazem (CARDIZEM) 120 MG tablet Take  1 tablet  2 x /day (every 12 hours)  for BP 11/22/20   Unk Pinto, MD  famotidine (PEPCID) 20 MG tablet 20 mg daily. As needed.    [provider]  fluticasone (FLONASE) 50 MCG/ACT nasal spray Place 1-2 sprays into both nostrils daily as needed for allergies or rhinitis. Patient not taking: Reported on 11/08/2020 10/13/20   [name], NP  furosemide (LASIX) 20 MG tablet Take 1 tablet (20 mg total) by mouth daily as needed for edema. 01/21/19   Vladimir Crofts, PA-C  Mouthwash Compounding Base LIQD Take 5 mLs by mouth every 2 (two) hours as needed. 11/29/20   [name], NP  polyethylene glycol powder (GLYCOLAX/MIRALAX) 17 GM/SCOOP powder Take 17 g by  mouth daily. 12/23/20   Carlisle Cater, PA-C  potassium chloride SA (KLOR-CON) 20 MEQ tablet TAKE 1 TABLET BY MOUTH TWICE DAILY FOR POTASSIUM 11/18/20   [name], NP  pravastatin (PRAVACHOL) 40 MG tablet Take  1 tablet  at Bedtime  for Cholesterol 11/19/20   Unk Pinto,  MD  propranolol (INDERAL) 10 MG tablet TAKE 1 TABLET TWICE DAILY FOR ANXIETY 05/23/20   Unk Pinto, MD  sertraline (ZOLOFT) 100 MG tablet TAKE 1 TABLET BY MOUTH DAILY FOR MOOD WITH FOOD 12/10/20   [name], NP    Allergies    Other, Prednisone, Vioxx [rofecoxib], Entex t [pseudoephedrine-guaifenesin], Levaquin [levofloxacin], Sulfa antibiotics, and Trovan [alatrofloxacin]  Review of Systems   Review of Systems  Genitourinary: Positive for flank pain.  All other systems reviewed and are negative.   Physical Exam Updated Vital Signs BP (!) 148/77   Pulse 84   Temp 99 F (37.2 C) (Oral)   Resp 17   Ht 5\' 3"  (1.6 m)   Wt 59.4 kg   SpO2 94%   BMI 23.21 kg/m   Physical Exam Vitals and nursing note reviewed.  Constitutional:      Appearance: She is well-developed.  HENT:     Head: Normocephalic and atraumatic.     Nose: No congestion or rhinorrhea.     Mouth/Throat:     Mouth: Mucous membranes are moist.     Pharynx: Oropharynx is clear.  Cardiovascular:     Rate and Rhythm: Normal rate and regular rhythm.  Pulmonary:     Effort: No respiratory distress.     Breath sounds: No stridor.  Abdominal:     General: Abdomen is flat. There is no distension.  Musculoskeletal:        General: Tenderness (with palpation of left flank, moreso to the posterior aspect) present. No swelling. Normal range of motion.     Cervical back: Normal range of motion.  Skin:    General: Skin is warm and dry.     Findings: No rash (no obvious vesicles or other rash in area of discomfort).  Neurological:     General: No focal deficit present.     Mental Status: She is alert.     ED Results / Procedures /  Treatments   Labs (all labs ordered are listed, but only abnormal results are displayed) Labs Reviewed  COMPREHENSIVE METABOLIC PANEL - Abnormal; Notable for the following components:      Result Value   Potassium 3.3 (*)    Glucose, Bld 112 (*)    Calcium 8.8 (*)    Total Protein 6.1 (*)    All other components within normal limits  URINALYSIS, ROUTINE W REFLEX MICROSCOPIC - Abnormal; Notable for the following components:   Color, Urine COLORLESS (*)    Specific Gravity, Urine <1.005 (*)    All other components within normal limits  CBC WITH DIFFERENTIAL/PLATELET    EKG None  Radiology No results found.  Procedures Procedures   Medications Ordered in ED Medications  diazepam (VALIUM) tablet 5 mg (5 mg Oral Given 12/25/20 2343)  ketorolac (TORADOL) 30 MG/ML injection 15 mg (15 mg Intravenous Given 12/25/20 2342)    ED Course  I have reviewed the triage vital signs and the nursing notes.  Pertinent labs & imaging results that were available during my care of the patient were reviewed by me and considered in my medical decision making (see chart for details).    MDM Rules/Calculators/A&P                         Unclear etiology, however could be vascular or infectious, may need contrasted scan but will check labs and offer symptomatic care first.  Labs reassurring. Symptoms improved with meds here. Suspect MSK cause. Stable  for discharge.   Final Clinical Impression(s) / ED Diagnoses Final diagnoses:  Muscle spasm    Rx / DC Orders ED Discharge Orders         Ordered    diazepam (VALIUM) 5 MG tablet  Every 8 hours PRN        12/26/20 0126    meloxicam (MOBIC) 15 MG tablet  Daily        12/26/20 0126           Leisa Gault, Corene Cornea, MD 12/27/20 [phone]

## 2020-12-27 NOTE — Progress Notes (Signed)
Future Appointments  Date Time Provider [place]  12/28/2020 11:30 AM Unk Pinto, MD [facility] None  05/26/2021 10:30 AM [name], NP [facility] None  06/23/2021  9:00 AM GI[facility] DX DEXA 1 [facility] GI-BREAST CE  11/17/2021 11:00 AM Unk Pinto, MD [facility] None    History of Present Illness:            Patient is a 70 yo WWF with HTN, HLD, Pre-Diabetes and Vitamin D Deficiency who has been evaluated twice in the ER on 12/23/2020 and 04/23 /2022 for sharp Left flank pains with a negative CT stone study  And negative /normal CBC & CMET. She was suspected to have musculoskeletal pains abnd released with new Rx's for Diazepam & Meloxicam. She denies any new or associated GI, Respiratory or GU sx's.  She relates still having positional pains with bending or torso rotation.  Medications  .  diltiazem (CARDIZEM) 120 MG tablet, Take  1 tablet  2 x /day (every 12 hours)  for BP .  furosemide (LASIX) 20 MG tablet, Take 1 tablet  daily as needed for edema. .  pravastatin (PRAVACHOL) 40 MG tablet, Take  1 tablet  at Bedtime  for Cholesterol  .  aspirin 81 MG tablet, Take at bedtime.  .  meloxicam (MOBIC) 15 MG tablet, Take 1 tablet  daily.   Marland Kitchen  ALPRAZolam (XANAX) 0.5 MG tablet, Take 1/2  to 1 tablet 2 to 3 x /day  .  cholecalciferol (VITAMIN D) 1000 units tablet, Take 10,000 Units  daily. .  diazepam  5 MG tablet, Take 0.5-1 tablets every 8  hours as needed for anxiety ( .  famotidine (PEPCID) 20 MG tablet, 20 mg daily. As needed.  .  polyethylene glycol 17 GM/SCOOP , Take 17 g daily. .  potassium chloride  20 MEQ tablet, TAKE 1 TABLET  TWICE DAILY  .  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   Observations/Objective:   Pulse 89   Temp 97.7 F (36.5 C)   Resp 16   Ht 5\' 3"  (1.6 m)   Wt 135 lb (61.2 kg)   SpO2 98%   BMI 23.91 kg/m   HEENT - WNL. Neck - supple.  Chest - Clear equal BS. Cor - Nl HS. RRR w/o sig MGR. PP 1(+). No edema. Abd - Soft. Benign . Non-tender.  MS- FROM w/o deformities.  Gait Nl. Tender along the left lower para-lumbar area a to the left iliac creat area.   Neuro -  Nl w/o focal abnormalities.  Assessment and Plan:  1. Musculoskeletal back pain  - predniSONE  50 MG tablet;  Take  2 tablets  now, then take 1 tablet  2 x /day  for 3 days, then daily for 4 days   Disp: 12 tablet  - gabapentin 300 MG capsule;  Take 1 capsule  3 x /day  for Pain   Disp: 90 capsule  - Advised try heat &/or ice  I discussed the assessment and treatment plan with the patient. The patient was provided an opportunity to ask questions and all were answered. The patient agreed with the plan and demonstrated an understanding of the instructions.       The patient was advised to call back or seek an in-person evaluation if the symptoms worsen or if the condition fails to improve as anticipated.   Kirtland Bouchard, MD

## 2020-12-28 ENCOUNTER — Other Ambulatory Visit: Payer: Self-pay

## 2020-12-28 ENCOUNTER — Encounter: Payer: Self-pay | Admitting: Internal Medicine

## 2020-12-28 ENCOUNTER — Ambulatory Visit (INDEPENDENT_AMBULATORY_CARE_PROVIDER_SITE_OTHER): Payer: Medicare Other | Admitting: Internal Medicine

## 2020-12-28 VITALS — BP 140/80 | HR 89 | Temp 97.7°F | Resp 16 | Ht 63.0 in | Wt 135.0 lb

## 2020-12-28 DIAGNOSIS — M549 Dorsalgia, unspecified: Secondary | ICD-10-CM

## 2020-12-28 MED ORDER — GABAPENTIN 300 MG PO CAPS: ORAL_CAPSULE | ORAL | 0 refills | Status: DC

## 2020-12-28 MED ORDER — PREDNISONE 50 MG PO TABS: ORAL_TABLET | ORAL | 0 refills | Status: DC

## 2020-12-28 NOTE — Patient Instructions (Signed)
Musculoskeletal Pain Musculoskeletal pain refers to aches and pains in your bones, joints, muscles, and the tissues that surround them. This pain can occur in any part of the body. It can last for a short time (acute) or a long time (chronic). A physical exam, lab tests, and imaging studies may be done to find the cause of your musculoskeletal pain. Follow these instructions at home: Lifestyle  Try to control or lower your stress levels. Stress increases muscle tension and can worsen musculoskeletal pain. It is important to recognize when you are anxious or stressed and learn ways to manage it. This may include: ? Meditation or yoga. ? Cognitive or behavioral therapy. ? Acupuncture or massage therapy.  You may continue all activities unless the activities cause more pain. When the pain gets better, slowly resume your normal activities. Gradually increase the intensity and duration of your activities or exercise. Managing pain, stiffness, and swelling  Treatment may include medicines for pain and inflammation that are taken by mouth or applied to the skin. Take over-the-counter and prescription medicines only as told by your health care provider.  When your pain is severe, bed rest may be helpful. Lie or sit in any position that is comfortable, but get out of bed and walk around at least every couple of hours.  If directed, apply heat to the affected area as often as told by your health care provider. Use the heat source that your health care provider recommends, such as a moist heat pack or a heating pad. ? Place a towel between your skin and the heat source. ? Leave the heat on for 20-30 minutes. ? Remove the heat if your skin turns bright red. This is especially important if you are unable to feel pain, heat, or cold. You may have a greater risk of getting burned.  If directed, put ice on the painful area. To do this: ? Put ice in a plastic bag. ? Place a towel between your skin and the  bag. ? Leave the ice on for 20 minutes, 2-3 times a day. ? Remove the ice if your skin turns bright red. This is very important. If you cannot feel pain, heat, or cold, you have a greater risk of damage to the area.      General instructions  Your health care provider may recommend that you see a physical therapist. This person can help you come up with a safe exercise program.  If told by your health care provider, do physical therapy exercises to improve movement and strength in the affected area.  Keep all follow-up visits. This is important. This includes any physical therapy visits. Contact a health care provider if:  Your pain gets worse.  Medicines do not help ease your pain.  You cannot use the part of your body that hurts, such as your arm, leg, or neck.  You have trouble sleeping.  You have trouble doing your normal activities. Get help right away if:  You have a new injury and your pain is worse or different.  You feel numb or you have tingling in the painful area. Summary  Musculoskeletal pain refers to aches and pains in your bones, joints, muscles, and the tissues that surround them.  This pain can occur in any part of the body.  Your health care provider may recommend that you see a physical therapist. This person can help you come up with a safe exercise program. Do any exercises as told by your physical therapist.    Lower your stress level. Stress can worsen musculoskeletal pain. Ways to lower stress may include meditation, yoga, cognitive or behavioral therapy, acupuncture, and massage therapy. This information is not intended to replace advice given to you by your health care provider. Make sure you discuss any questions you have with your health care provider. Document Revised: 12/25/2019 Document Reviewed: 12/03/2019 Elsevier Patient Education  2021 [employer] Inc.  

## 2021-01-01 ENCOUNTER — Other Ambulatory Visit: Payer: Self-pay | Admitting: Adult Health

## 2021-01-03 ENCOUNTER — Telehealth: Payer: Self-pay | Admitting: *Deleted

## 2021-01-03 NOTE — Telephone Encounter (Signed)
Patient advised life insurance forms are ready to be picked and completed by her.

## 2021-01-04 ENCOUNTER — Telehealth: Payer: Self-pay | Admitting: *Deleted

## 2021-01-04 NOTE — Telephone Encounter (Signed)
Returned call to patient in regard to left flank pain. Patient finished prednisone taper, but flank pain persist. Per Dr Melford Aase, she cn apply heat to the are and take Tylenol 1000 mg 4 times a day for pain relief.

## 2021-01-11 DIAGNOSIS — M545 Low back pain, unspecified: Secondary | ICD-10-CM | POA: Diagnosis not present

## 2021-02-17 ENCOUNTER — Other Ambulatory Visit: Payer: Self-pay | Admitting: Internal Medicine

## 2021-02-17 DIAGNOSIS — E782 Mixed hyperlipidemia: Secondary | ICD-10-CM

## 2021-02-17 MED ORDER — PRAVASTATIN SODIUM 40 MG PO TABS: ORAL_TABLET | ORAL | 3 refills | Status: DC

## 2021-03-16 ENCOUNTER — Ambulatory Visit (INDEPENDENT_AMBULATORY_CARE_PROVIDER_SITE_OTHER): Payer: Medicare Other | Admitting: Internal Medicine

## 2021-03-16 ENCOUNTER — Other Ambulatory Visit: Payer: Self-pay

## 2021-03-16 VITALS — BP 126/82 | HR 84 | Temp 97.7°F | Resp 17 | Ht 63.5 in | Wt 128.4 lb

## 2021-03-16 DIAGNOSIS — J0100 Acute maxillary sinusitis, unspecified: Secondary | ICD-10-CM

## 2021-03-16 DIAGNOSIS — H6591 Unspecified nonsuppurative otitis media, right ear: Secondary | ICD-10-CM

## 2021-03-16 MED ORDER — DEXAMETHASONE 4 MG PO TABS: ORAL_TABLET | ORAL | 0 refills | Status: DC

## 2021-03-16 MED ORDER — AZITHROMYCIN 250 MG PO TABS: ORAL_TABLET | ORAL | 1 refills | Status: DC

## 2021-03-16 MED ORDER — PSEUDOEPHEDRINE HCL ER 120 MG PO TB12: ORAL_TABLET | ORAL | 2 refills | Status: DC

## 2021-03-16 NOTE — Progress Notes (Signed)
Future Appointments  Date Time Provider [place]  05/26/2021 10:30 AM [name], NP [facility] None  11/17/2021 11:00 AM Unk Pinto, MD [facility] None   History of Present Illness:        Patient is a very nice recently widowed WF presenting with c/o R ear pressure & sinus congestion, post nasal drainage & productive cough. Covid test was negative.      Medications    diltiazem (CARDIZEM) 120 MG tablet, Take  1 tablet  2 x /day (every 12 hours)  for BP   furosemide (LASIX) 20 MG tablet, Take 1 tablet (20 mg total) by mouth daily as needed for edema.   pravastatin (PRAVACHOL) 40 MG tablet, Take  1 tablet  at Bedtime  for Cholesterol   aspirin 81 MG tablet, Take 81 mg by mouth at bedtime.    meloxicam (MOBIC) 15 MG tablet, Take 1 tablet (15 mg total) by mouth daily.   ALPRAZolam (XANAX) 0.5 MG tablet, Take     1/2  to 1 tablet        2 to 3 x /day         as needed for Nerves   cholecalciferol (VITAMIN D) 1000 units tablet, Take 10,000 Units by mouth daily.   diazepam (VALIUM) 5 MG tablet, Take 0.5-1 tablets (2.5-5 mg total) by mouth every 8 (eight) hours as needed for anxiety (spasms).   famotidine (PEPCID) 20 MG tablet, 20 mg daily. As needed.   gabapentin (NEURONTIN) 300 MG capsule, Take 1 capsule  3 x /day  for Pain   Mouthwash Compounding Base LIQD, Take 5 mLs by mouth every 2 (two) hours as needed.   polyethylene glycol powder (GLYCOLAX/MIRALAX) 17 GM/SCOOP powder, Take 17 g by mouth daily.   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   Observations/Objective:  BP 126/82   Pulse 84   Temp 97.7 F (36.5 C)   Resp 17   Ht 5' 3.5" (1.613 m)   Wt 128 lb 6.4 oz (58.2 kg)   SpO2 98%   BMI 22.39 kg/m   HEENT - WNL. Neck - supple.  Chest - Clear equal BS. Cor - Nl HS. RRR w/o sig MGR. PP 1(+). No edema. MS- FROM w/o deformities.  Gait Nl. Neuro -  Nl w/o focal abnormalities.  Assessment and Plan:   1. Right serous otitis media, unspecified chronicity  - azithromycin (ZITHROMAX) 250 MG tablet; Take 2 tablets with Food on  Day 1, then 1 tablet Daily with Food for Sinusitis / Bronchitis  Dispense: 6 each; Refill: 1  - pseudoephedrine (SUDAFED) 120 MG 12 hr tablet; Take   1 tablet    2 x /day (every 12 hours)    for Sinus & Chest Congestion  Dispense: 20 tablet; Refill: 2   2. Subacute maxillary sinusitis  - dexamethasone (  DECADRON) 4 MG tablet; Take 1 tab 3 x day - 3 days, then 2 x day - 3 days, then 1 tab daily  Dispense: 20 tablet  - azithromycin (ZITHROMAX) 250 MG tablet; Take 2 tablets with Food on  Day 1, then 1 tablet Daily with Food for Sinusitis / Bronchitis  Dispense: 6 each; Refill: 1   Follow Up Instructions:      I discussed the assessment and treatment plan with the patient. The patient was provided an opportunity to ask questions and all were answered. The patient agreed with the plan and demonstrated an understanding of the instructions.        The patient was advised to call back or seek an in-person evaluation if the symptoms worsen or if the condition fails to improve as anticipated.   Kirtland Bouchard, MD

## 2021-03-20 ENCOUNTER — Encounter: Payer: Self-pay | Admitting: Internal Medicine

## 2021-03-24 ENCOUNTER — Ambulatory Visit (INDEPENDENT_AMBULATORY_CARE_PROVIDER_SITE_OTHER): Payer: Medicare Other | Admitting: Internal Medicine

## 2021-03-24 ENCOUNTER — Telehealth: Payer: Self-pay

## 2021-03-24 ENCOUNTER — Encounter: Payer: Self-pay | Admitting: Internal Medicine

## 2021-03-24 ENCOUNTER — Other Ambulatory Visit: Payer: Self-pay

## 2021-03-24 VITALS — BP 126/74 | HR 71 | Temp 97.3°F | Resp 16 | Ht 63.5 in | Wt 126.0 lb

## 2021-03-24 DIAGNOSIS — R5383 Other fatigue: Secondary | ICD-10-CM

## 2021-03-24 DIAGNOSIS — F5102 Adjustment insomnia: Secondary | ICD-10-CM

## 2021-03-24 DIAGNOSIS — F32A Depression, unspecified: Secondary | ICD-10-CM

## 2021-03-24 DIAGNOSIS — J0100 Acute maxillary sinusitis, unspecified: Secondary | ICD-10-CM | POA: Diagnosis not present

## 2021-03-24 MED ORDER — TRAZODONE HCL 150 MG PO TABS: ORAL_TABLET | ORAL | 1 refills | Status: DC

## 2021-03-24 NOTE — Progress Notes (Signed)
Future Appointments  Date Time Provider [place]  05/26/2021 10:30 AM [name], NP [facility] None  11/17/2021 11:00 AM Unk Pinto, MD [facility] None    History of Present Illness:    Patient is a very nice 70 yo recently widowed WF presenting with multiple c/o of "hair falling out", fatigue /weakness, "not sleeping" and poor appetite".   ! Week ago, patient was treated with a Zpak , PSE & Decadron taper for a URI/sinusitis with sx's recovered.   Medications    diltiazem (CARDIZEM) 120 MG tablet, Take  1 tablet  2 x /day (every 12 hours)  for BP   furosemide (LASIX) 20 MG tablet, Take 1 tablet (20 mg total) by mouth daily as needed for edema.   pravastatin (PRAVACHOL) 40 MG tablet, Take  1 tablet  at Bedtime  for Cholesterol   pseudoephedrine (SUDAFED) 120 MG 12 hr tablet, Take   1 tablet    2 x /day (every 12 hours)    for Sinus & Chest Congestion   aspirin 81 MG tablet, Take 81 mg by mouth at bedtime.    meloxicam (MOBIC) 15 MG tablet, Take 1 tablet (15 mg total) by mouth daily.   ALPRAZolam (XANAX) 0.5 MG tablet, Take     1/2  to 1 tablet        2 to 3 x /day         as needed for Nerves   cholecalciferol (VITAMIN D) 1000 units tablet, Take 10,000 Units by mouth daily.   famotidine  20 MG tablet, 20 mg daily. As needed.   gabapentin 300 MG capsule, Take 1 capsule  3 x /day  for Pain   Mouthwash  LIQD, Take 5 mLs  every 2 (two) hours as needed.   polyethylene glycol powder, Take 17 g  daily.   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   Observations/Objective:  BP 126/74   Pulse 71   Temp (!) 97.3 F (36.3 C)   Resp 16   Ht 5' 3.5" (1.613 m)   Wt 126 lb (57.2 kg)   SpO2 99%   BMI 21.97 kg/m   HEENT - WNL. Neck - supple.  Chest - Clear equal BS. Cor - Nl HS. RRR w/o sig MGR. PP 1(+). No edema. MS- FROM w/o deformities.  Gait Nl. Neuro -  Nl w/o focal abnormalities.  Assessment and Plan:  1. Fatigue due to depression  - traZODone (DESYREL) 150 MG tablet; Take 1/3 to 1/2 to 1 tablet 1 hour before Bedtime as needed for Sleep  Dispense: 90 tablet; Refill: 1  2. Insomnia due to psychological stress  - traZODone (DESYREL) 150 MG tablet; Take 1/3 to 1/2 to 1 tablet 1 hour before Bedtime as needed for Sleep  Dispense: 90 tablet; Refill: 1  3. Subacute maxillary sinusitis  - complete steroid taper  Follow Up  Instructions:      I discussed the assessment and treatment plan with the patient. The patient was provided an opportunity to ask questions and all were answered. The patient agreed with the plan and demonstrated an understanding of the instructions.       The patient was advised to call back or seek an in-person evaluation if the symptoms worsen or if the condition fails to improve as anticipated.   Kirtland Bouchard, MD

## 2021-03-24 NOTE — Telephone Encounter (Signed)
Error

## 2021-04-18 ENCOUNTER — Telehealth: Payer: Self-pay

## 2021-04-18 NOTE — Telephone Encounter (Signed)
The patient called to report feet swelling with onset Sunday 04/17/21. She reports that she took a fluid pill last night and 1/2 pill this morning. Per Dr. Melford Aase the patient was advised to take 2 pills in the morning and 2 pills in the evening for three to four days and to monitor her symptoms. The patient was advised to call this office with an update on her symptoms and to call with any further questions or concerns. She was advised to call 911 or go to her local emergency department if she has any new or worsening symptoms/life threatening event.   The patient voiced understanding with this staff member.

## 2021-04-25 ENCOUNTER — Encounter: Payer: Self-pay | Admitting: Adult Health

## 2021-04-25 ENCOUNTER — Ambulatory Visit (INDEPENDENT_AMBULATORY_CARE_PROVIDER_SITE_OTHER): Payer: Medicare Other | Admitting: Adult Health

## 2021-04-25 ENCOUNTER — Other Ambulatory Visit: Payer: Self-pay

## 2021-04-25 VITALS — BP 112/70 | HR 73 | Temp 97.8°F | Resp 16 | Ht 63.5 in | Wt 125.6 lb

## 2021-04-25 DIAGNOSIS — R42 Dizziness and giddiness: Secondary | ICD-10-CM | POA: Diagnosis not present

## 2021-04-25 DIAGNOSIS — H6123 Impacted cerumen, bilateral: Secondary | ICD-10-CM | POA: Diagnosis not present

## 2021-04-25 DIAGNOSIS — J310 Chronic rhinitis: Secondary | ICD-10-CM

## 2021-04-25 NOTE — Patient Instructions (Signed)
Use a dropper or use a cap to put peroxide, olive oil,mineral oil or canola oil in the effected ear- 2-3 times a week. Let it soak for 20-30 min then you can take a shower or use a baby bulb with warm water to wash out the ear wax.  Can buy debrox wax removal kit over the counter.  Do not use Qtips   Saline nasal irrigations - as much as you need Flonase 1-2 sprays in each nostril once or twice daily     Nonallergic Rhinitis Nonallergic rhinitis is inflammation of the mucous membrane inside the nose. The mucous membrane is the tissue that produces mucus. This condition is different from having allergic rhinitis, which is an allergy that affects the nose. Allergic rhinitis occurs when the body's defense system, or immune system, reacts to a substance that a person is allergic to (allergen), such as pollen, pet dander, mold, or dust. Nonallergic rhinitis has manysimilar symptoms, but it is not caused by allergens. Nonallergic rhinitis can be an acute or chronic problem. This means it can beshort-term or long-term. What are the causes? This condition may be caused by many different things. Some common types of nonallergic rhinitis include: Infectious rhinitis. This is usually caused by an infection in the nose, throat, or upper airways (upper respiratory system). Vasomotor rhinitis. This is the most common type of chronic nonallergic rhinitis. It is caused by too much blood flow through your nose, and it leads to swelling in your nose. It is triggered by strong odors, cold air, stress, drinking alcohol, cigarette smoke, or changes in the weather. Occupational rhinitis. This type is caused by triggers in the workplace, such as chemicals, dust, animal dander, or air pollution. Hormonal rhinitis, in teenage girls and women. This type is caused by an increase in the hormone estrogen and may happen during pregnancy, puberty, or monthly menstrual periods. Hormonal rhinitis gives you fewer symptoms when  estrogen levels drop. Drug-induced rhinitis. Several types of medicines can cause this, such as medicines for high blood pressure or heart disease, aspirin, or NSAIDs. Nonallergic rhinitis with eosinophilia syndrome (NARES). This type is caused by having too much eosinophil, a type of white blood cell. Other causes include a reaction to eating hot or spicy foods. This does notusually cause long-term symptoms. In some cases, the cause of nonallergic rhinitis is not known. What increases the risk? You are more likely to develop this condition if: You are 25-72 years of age. You are a woman. Women are twice as likely to have this condition. What are the signs or symptoms? Common symptoms of this condition include: Stuffy nose (nasal congestion). Runny nose. A feeling of mucus dripping down the back of your throat (postnasal drip). Trouble sleeping. Tiredness, or fatigue. Other symptoms include: Sneezing. Coughing. Itchy nose. Bloodshot eyes. How is this diagnosed? This type may be diagnosed based on: Your symptoms and medical history. A physical exam. Allergy testing to rule out allergic rhinitis. You may have skin tests or blood tests. Your health care provider may also take a swab of nasal discharge to look for an increased number of eosinophils. This would be done to confirm a diagnosisof NARES. How is this treated? Treatment for this condition depends on the cause. No single treatment works for everyone. Work with your health care provider to find the best treatment for you. Treatment may include: Avoiding the things that trigger your symptoms. Medicines to relieve congestion, such as: Steroid nasal spray. There are many types. You may  need to try a few to find out which one works best. Best boy medicine. This treats nasal congestion and may be given by mouth or as a nasal spray. These medicines are used only for a short time. Medicines to relieve a runny nose. These may include  antihistamine medicines or anticholinergic nasal sprays. Nasal irrigation. This involves using a salt-water (saline) spray or saline container called a neti pot. Nasal irrigation helps to clear away mucus and keep your nasal passages moist. Surgery to remove part of your mucous membrane. This is done in severe cases if the condition has not improved after 6-12 months of treatment. Follow these instructions at home: Medicines Take or use over-the-counter and prescription medicines only as told by your health care provider. Do not stop using your medicine even if you start to feel better. Do not take NSAIDs, such as ibuprofen, or medicines that contain aspirin if they make your symptoms worse. Lifestyle Do not drink alcohol if it makes your symptoms worse. Do not use any products that contain nicotine or tobacco, such as cigarettes, e-cigarettes, and chewing tobacco. If you need help quitting, ask your health care provider. Avoid secondhand smoke. General instructions Avoid triggers that make your symptoms worse. Use nasal irrigation as told by your health care provider. Get exercise. Exercise may help reduce symptoms for some people. Sleep with the head of your bed raised. This may reduce nasal congestion when you sleep. Drink enough fluid to keep your urine pale yellow. Keep all follow-up visits as told by your health care provider. This is important. Contact a health care provider if: You have a fever. Your symptoms are getting worse at home. Your symptoms do not lessen with medicine. You develop new symptoms, especially a headache or nosebleed. Summary Nonallergic rhinitis is inflammation inside the nose that is not caused by allergens. Nonallergic rhinitis can be a short-term or long-term problem. Treatment may include avoiding the things that trigger your symptoms. Take or use over-the-counter and prescription medicines only as told by your health care provider. Do not stop using your  medicine even if you start to feel better. Contact a health care provider if your symptoms do not lessen with medicine. This information is not intended to replace advice given to you by your health care provider. Make sure you discuss any questions you have with your healthcare provider. Document Revised: 06/30/2019 Document Reviewed: 06/30/2019 Elsevier Patient Education  [place].

## 2021-04-25 NOTE — Progress Notes (Signed)
Assessment and Plan:  Latoya [name] was seen today for acute visit.  Diagnoses and all orders for this visit:  Bilateral hearing loss due to cerumen impaction - stop using Qtips, irrigation used in the office without complications, use OTC drops/oil at home to prevent reoccurence  Chronic rhinitis - reviewed hygiene, she declines prescriptions. States will restart saline irrigations, recommended flonase 1-2 sprays in each nostril, short term BID for acute congestion, once daily for maintenance. No signs of sinusitis at this time.   Vertigo Resolved at time of exam; normal neuro exam today hx of similar recurrent r/t rhinitis/serous otitis media; recommended treat with nasal steroids, autoinflation reviewed.  She states has meclizine PRN at home, hasn't tried this, risks and SE reviewed If HA, changes vision/speech, imbalance, weakness go to the ER   Further disposition pending results of labs. Discussed med's effects and SE's.   Over 30 minutes of exam, counseling, chart review, and critical decision making was performed.   Future Appointments  Date Time Provider [place]  05/26/2021 10:30 AM [name], NP [facility] None  06/23/2021  9:00 AM [facility] DX DEXA 1 [facility]  11/17/2021 11:00 AM Unk Pinto, MD [facility] None    ------------------------------------------------------------------------------------------------------------------   HPI BP 112/70   Pulse 73   Temp 97.8 F (36.6 C)   Resp 16   Ht 5' 3.5" (1.613 m)   Wt 125 lb 9.6 oz (57 kg)   SpO2 98%   BMI 21.90 kg/m  70 y.o.female presents for evaluation of vertigo and ear fullness.   She reports 3 days ago she woke up with spinning sensation, with ear pressure and aching worse in in R ear. Has had full sensation. Denies HA, numbness/tingling. Denies with sitting to standing. She notes this is improving today. Denies vertigo sx at time of exam.   She also has chronic rhinitis, has been  advised antihistamine and nasal sprays in the past but admits hasn't been doing this. Denies watery/itchy eyes, sneezing. Does saline nasal srays, does have floanse at home which has helped in the past but hasn't been doing.   Past Medical History:  Diagnosis Date   Anxiety and depression    Arthritis    Colon polyps    GERD (gastroesophageal reflux disease)    Hemorrhoids    Hiatal hernia    History of kidney stones    passed   Hyperlipemia    Mixed hyperlipidemia 11/26/2013   Panic disorder    Passive smoke exposure 07/17/2018   Renal insufficiency    stage 3 kidney disease per her PCP   Stomach ulcer    Unspecified essential hypertension 11/26/2013     Allergies  Allergen Reactions   Other     All pain medications: Unknown/ CODEINE   Prednisone Other (See Comments)    DYSPHORIA   Vioxx [Rofecoxib]     unknown   Entex T [Pseudoephedrine-Guaifenesin] Palpitations   Levaquin [Levofloxacin] Rash    Tolerated Avelox without adverse effect 11/08/15.   Sulfa Antibiotics Rash   Trovan [Alatrofloxacin] Rash    Current Outpatient Medications on File Prior to Visit  Medication Sig   ALPRAZolam (XANAX) 0.5 MG tablet Take     1/2  to 1 tablet        2 to 3 x /day         as needed for Nerves   aspirin 81 MG tablet Take 81 mg by mouth at bedtime.    cholecalciferol (VITAMIN D) 1000 units tablet  Take 10,000 Units by mouth daily.   diltiazem (CARDIZEM) 120 MG tablet Take  1 tablet  2 x /day (every 12 hours)  for BP   famotidine (PEPCID) 20 MG tablet 20 mg daily. As needed.   furosemide (LASIX) 20 MG tablet Take 1 tablet (20 mg total) by mouth daily as needed for edema.   gabapentin (NEURONTIN) 300 MG capsule Take 1 capsule  3 x /day  for Pain   meloxicam (MOBIC) 15 MG tablet Take 1 tablet (15 mg total) by mouth daily.   Mouthwash Compounding Base LIQD Take 5 mLs by mouth every 2 (two) hours as needed.   polyethylene glycol powder (GLYCOLAX/MIRALAX) 17 GM/SCOOP powder Take 17 g by mouth  daily.   potassium chloride SA (KLOR-CON) 20 MEQ tablet TAKE 1 TABLET BY MOUTH TWICE DAILY FOR POTASSIUM   pravastatin (PRAVACHOL) 40 MG tablet Take  1 tablet  at Bedtime  for Cholesterol   sertraline (ZOLOFT) 100 MG tablet TAKE 1 TABLET BY MOUTH DAILY FOR MOOD WITH FOOD   traZODone (DESYREL) 150 MG tablet Take 1/3 to 1/2 to 1 tablet 1 hour before Bedtime as needed for Sleep   No current facility-administered medications on file prior to visit.    ROS: all negative except above.   Physical Exam:  BP 112/70   Pulse 73   Temp 97.8 F (36.6 C)   Resp 16   Ht 5' 3.5" (1.613 m)   Wt 125 lb 9.6 oz (57 kg)   SpO2 98%   BMI 21.90 kg/m   General Appearance: Well nourished, well dressed elder female in no apparent distress. Eyes: PERRLA, EOMs, conjunctiva no swelling or erythema Sinuses: No Frontal/maxillary tenderness ENT/Mouth: bil canals obstructed by wax; no tragal or auricular tenderness, after irrigation TMs without erythema, bulging. No erythema, swelling, or exudate on post pharynx.  Tonsils not swollen or erythematous. Hearing normal. Chronic mildly hoarse vocal quality, consistent with baseline.  Neck: Supple, thyroid normal. No carotid bruits. Dizziness not elicited with full cervical ROM including neck extension.  Respiratory: Respiratory effort normal, BS equal bilaterally without rales, rhonchi, wheezing or stridor.  Cardio: RRR with no MRGs. Brisk peripheral pulses without edema.  Abdomen: Soft, + BS.  Non tender, no guarding. Lymphatics: Non tender without lymphadenopathy.  Musculoskeletal: Full ROM, 5/5 strength, normal gait.  Skin: Warm, dry without rashes, lesions, ecchymosis.  Neuro: Cranial nerves intact. Normal muscle tone, no cerebellar symptoms. Sensation intact.  Psych: Awake and oriented X 3, normal affect, Insight and Judgment appropriate.     Izora Ribas, NP 12:21 PM [facility]

## 2021-04-29 DIAGNOSIS — M25511 Pain in right shoulder: Secondary | ICD-10-CM | POA: Diagnosis not present

## 2021-04-29 DIAGNOSIS — M25561 Pain in right knee: Secondary | ICD-10-CM | POA: Diagnosis not present

## 2021-05-02 ENCOUNTER — Telehealth: Payer: Self-pay

## 2021-05-02 NOTE — Telephone Encounter (Signed)
Patient states that she is having major nasal congestion and cough which sounds like its going into her chest. Symptoms started on Saturday and has been doing a nasal spray, taking a decongestant as recommended. No fever. Has sinus drainage as well.  Advised patient to monitor her oxygen levels and to test for Covid. ER precautions given.

## 2021-05-04 NOTE — Progress Notes (Signed)
Assessment and Plan:  Latoya [name] was seen today for acute visit.  Diagnoses and all orders for this visit:  Encounter for screening for COVID-19 -     POC COVID-19  Right serous otitis media, unspecified chronicity -     azithromycin (ZITHROMAX) 250 MG tablet; Take 2 tablets (500 mg) on  Day 1,  followed by 1 tablet (250 mg) once daily on Days 2 through 5.       - Pt advised to use Mucinex DM q 12 hours as needed for cough/congestion  Monitor symptoms if no improvement in next 7 days call the office  Further disposition pending results of labs. Discussed med's effects and SE's.   Over 20 minutes of exam, counseling, chart review, and critical decision making was performed.   Future Appointments  Date Time Provider [place]  05/26/2021 10:30 AM [name], NP [facility] None  06/23/2021  9:00 AM [facility] DX DEXA 1 [facility]  11/17/2021 11:00 AM Unk Pinto, MD [facility] None    ------------------------------------------------------------------------------------------------------------------   HPI BP (!) 142/78   Pulse 68   Temp 97.7 F (36.5 C)   Wt 124 lb 12.8 oz (56.6 kg)   SpO2 98%   BMI 21.76 kg/m  70 y.o.female presents for evaluation of congestion Approximately 1 week ago she started having sinus congestion and nonproductive cough, right ear congestion. Denies fever, myalgias, headaches, and sore throat. Pt has a history of recurrent right serous otitis media with last occurrence 03/16/21  Past Medical History:  Diagnosis Date   Anxiety and depression    Arthritis    Colon polyps    GERD (gastroesophageal reflux disease)    Hemorrhoids    Hiatal hernia    History of kidney stones    passed   Hyperlipemia    Mixed hyperlipidemia 11/26/2013   Panic disorder    Passive smoke exposure 07/17/2018   Renal insufficiency    stage 3 kidney disease per her PCP   Stomach ulcer    Unspecified essential hypertension 11/26/2013     Allergies   Allergen Reactions   Other     All pain medications: Unknown/ CODEINE   Prednisone Other (See Comments)    DYSPHORIA   Vioxx [Rofecoxib]     unknown   Entex T [Pseudoephedrine-Guaifenesin] Palpitations   Levaquin [Levofloxacin] Rash    Tolerated Avelox without adverse effect 11/08/15.   Sulfa Antibiotics Rash   Trovan [Alatrofloxacin] Rash    Current Outpatient Medications on File Prior to Visit  Medication Sig   ALPRAZolam (XANAX) 0.5 MG tablet Take     1/2  to 1 tablet        2 to 3 x /day         as needed for Nerves   aspirin 81 MG tablet Take 81 mg by mouth at bedtime.    cholecalciferol (VITAMIN D) 1000 units tablet Take 10,000 Units by mouth daily.   diltiazem (CARDIZEM) 120 MG tablet Take  1 tablet  2 x /day (every 12 hours)  for BP   famotidine (PEPCID) 20 MG tablet 20 mg daily. As needed.   furosemide (LASIX) 20 MG tablet Take 1 tablet (20 mg total) by mouth daily as needed for edema.   gabapentin (NEURONTIN) 300 MG capsule Take 1 capsule  3 x /day  for Pain   meloxicam (MOBIC) 15 MG tablet Take 1 tablet (15 mg total) by mouth daily.   Mouthwash Compounding Base LIQD Take 5 mLs by mouth every  2 (two) hours as needed.   polyethylene glycol powder (GLYCOLAX/MIRALAX) 17 GM/SCOOP powder Take 17 g by mouth daily.   potassium chloride SA (KLOR-CON) 20 MEQ tablet TAKE 1 TABLET BY MOUTH TWICE DAILY FOR POTASSIUM   pravastatin (PRAVACHOL) 40 MG tablet Take  1 tablet  at Bedtime  for Cholesterol   sertraline (ZOLOFT) 100 MG tablet TAKE 1 TABLET BY MOUTH DAILY FOR MOOD WITH FOOD   traZODone (DESYREL) 150 MG tablet Take 1/3 to 1/2 to 1 tablet 1 hour before Bedtime as needed for Sleep   No current facility-administered medications on file prior to visit.    ROS: all negative except above.   Physical Exam:  BP (!) 142/78   Pulse 68   Temp 97.7 F (36.5 C)   Wt 124 lb 12.8 oz (56.6 kg)   SpO2 98%   BMI 21.76 kg/m   General Appearance: Well nourished, in no apparent  distress. Eyes: PERRLA, EOMs, conjunctiva no swelling or erythema Sinuses: No Frontal/maxillary tenderness ENT/Mouth: Ext aud canals clear, R TM bulging with serous discharge. No erythema, swelling, or exudate on post pharynx.  Tonsils not swollen or erythematous. Hearing normal.  Neck: Supple, thyroid normal.  Respiratory: Respiratory effort normal, BS equal bilaterally without rales, rhonchi, wheezing or stridor.  Cardio: RRR with no MRGs. Brisk peripheral pulses without edema.  Abdomen: Soft, + BS.  Non tender, no guarding, rebound, hernias, masses. Lymphatics: right submandibular adenopathy Musculoskeletal: Full ROM, 5/5 strength, normal gait.  Skin: Warm, dry without rashes, lesions, ecchymosis.  Neuro: Cranial nerves intact. Normal muscle tone, no cerebellar symptoms. Sensation intact.  Psych: Awake and oriented X 3, normal affect, Insight and Judgment appropriate.     [name], NP 10:47 AM [name]

## 2021-05-05 ENCOUNTER — Ambulatory Visit (INDEPENDENT_AMBULATORY_CARE_PROVIDER_SITE_OTHER): Payer: Medicare Other | Admitting: Nurse Practitioner

## 2021-05-05 ENCOUNTER — Encounter: Payer: Self-pay | Admitting: Nurse Practitioner

## 2021-05-05 ENCOUNTER — Other Ambulatory Visit: Payer: Self-pay

## 2021-05-05 VITALS — BP 142/78 | HR 68 | Temp 97.7°F | Wt 124.8 lb

## 2021-05-05 DIAGNOSIS — Z1152 Encounter for screening for COVID-19: Secondary | ICD-10-CM

## 2021-05-05 DIAGNOSIS — H6591 Unspecified nonsuppurative otitis media, right ear: Secondary | ICD-10-CM | POA: Diagnosis not present

## 2021-05-05 LAB — POC COVID19 BINAXNOW: SARS Coronavirus 2 Ag: NEGATIVE

## 2021-05-05 MED ORDER — AZITHROMYCIN 250 MG PO TABS: ORAL_TABLET | ORAL | 1 refills | Status: DC

## 2021-05-05 NOTE — Patient Instructions (Signed)
Zpak as directed Use Mucinex DM as needed to help with head congestion and cough  Otitis Media, Adult Otitis media occurs when there is inflammation and fluid in the middle ear with signs and symptoms of an acute infection. The middle ear is a part of the ear that contains bones for hearing as well as air that helps send sounds to the brain. When infected fluid builds up in this space, it causes pressure and can lead to an ear infection. The eustachian tube connects the middle ear to the back of the nose (nasopharynx) and normally allows air into the middle ear. If the eustachian tube becomes blocked, fluid can build up and become infected. What are the causes? This condition is caused by a blockage in the eustachian tube. This can be caused by mucus or by swelling of the tube. Problems that can cause a blockage include: A cold or other upper respiratory infection. Allergies. An irritant, such as tobacco smoke. Enlarged adenoids. The adenoids are areas of soft tissue located high in the back of the throat, behind the nose and the roof of the mouth. They are part of the body's defense system (immune system). A mass in the nasopharynx. Damage to the ear caused by pressure changes (barotrauma). What increases the risk? You are more likely to develop this condition if you: Smoke or are exposed to tobacco smoke. Have an opening in the roof of your mouth (cleft palate). Have gastroesophageal reflux. Have an immune system disorder. What are the signs or symptoms? Symptoms of this condition include: Ear pain. Fever. Decreased hearing. Tiredness (lethargy). Fluid leaking from the ear, if the eardrum is ruptured or has burst. Ringing in the ear. How is this diagnosed? This condition is diagnosed with a physical exam. During the exam, your health care provider will use an instrument called an otoscope to look in your ear and check for redness, swelling, and fluid. He or she will also ask about your  symptoms. Your health care provider may also order tests, such as: A pneumatic otoscopy. This is a test to check the movement of the eardrum. It is done by squeezing a small amount of air into the ear. A tympanogram. This is a test that shows how well the eardrum moves in response to air pressure in the ear canal. It provides a graph for your health care provider to review. How is this treated? This condition can go away on its own within 3-5 days. But if the condition is caused by a bacterial infection and does not go away on its own, or if it keeps coming back, your health care provider may: Prescribe antibiotic medicine to treat the infection. Prescribe or recommend medicines to control pain. Follow these instructions at home: Take over-the-counter and prescription medicines only as told by your health care provider. If you were prescribed an antibiotic medicine, take it as told by your health care provider. Do not stop taking the antibiotic even if you start to feel better. Keep all follow-up visits. This is important. Contact a health care provider if: You have bleeding from your nose. There is a lump on your neck. You are not feeling better in 5 days. You feel worse instead of better. Get help right away if: You have severe pain that is not controlled with medicine. You have swelling, redness, or pain around your ear. You have stiffness in your neck. A part of your face is not moving (paralyzed). The bone behind your ear (mastoid bone)  is tender when you touch it. You develop a severe headache. Summary Otitis media is redness, soreness, and swelling of the middle ear, usually resulting in pain and decreased hearing. This condition can go away on its own within 3-5 days. If the problem does not go away in 3-5 days, your health care provider may give you medicines to treat the infection. If you were prescribed an antibiotic medicine, take it as told by your health care  provider. Follow all instructions that were given to you by your health care provider. This information is not intended to replace advice given to you by your health care provider. Make sure you discuss any questions you have with your health care provider. Document Revised: 11/29/2020 Document Reviewed: 11/29/2020 Elsevier Patient Education  [name].

## 2021-05-13 ENCOUNTER — Encounter: Payer: Self-pay | Admitting: Internal Medicine

## 2021-05-13 ENCOUNTER — Ambulatory Visit (INDEPENDENT_AMBULATORY_CARE_PROVIDER_SITE_OTHER): Payer: Medicare Other | Admitting: Internal Medicine

## 2021-05-13 ENCOUNTER — Other Ambulatory Visit: Payer: Self-pay

## 2021-05-13 VITALS — BP 130/88 | HR 68 | Ht 63.5 in | Wt 125.4 lb

## 2021-05-13 DIAGNOSIS — E042 Nontoxic multinodular goiter: Secondary | ICD-10-CM

## 2021-05-13 DIAGNOSIS — Z8639 Personal history of other endocrine, nutritional and metabolic disease: Secondary | ICD-10-CM | POA: Diagnosis not present

## 2021-05-13 LAB — T3, FREE: T3, Free: 2.6 pg/mL (ref 2.3–4.2)

## 2021-05-13 LAB — TSH: TSH: 2.22 u[IU]/mL (ref 0.35–5.50)

## 2021-05-13 LAB — T4, FREE: Free T4: 0.71 ng/dL (ref 0.60–1.60)

## 2021-05-13 NOTE — Patient Instructions (Addendum)
We will need another thyroid U/S after 11/2021.  Please come back for a follow-up appointment in 1 year.

## 2021-05-13 NOTE — Progress Notes (Signed)
Patient ID: Latoya [name], female   DOB: [birth year], 70 y.o.   MRN: [id number]   This visit occurred during the SARS-CoV-2 public health emergency.  Safety protocols were in place, including screening questions prior to the visit, additional usage of staff PPE, and extensive cleaning of exam room while observing appropriate contact time as indicated for disinfecting solutions.   HPI  Latoya [name] is a 70 y.o.-year-old female, initially referred by her PCP, Unk Pinto, MD, returning for follow-up for thyroid nodule and h/o thyroid ds.  Our first visit was in 02/2019 and she was lost for follow-up afterwards.  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 He died Aug 26, 2020, then 2 brothers passed away next months. She cannot sleep well at night, but has a lot of energy. Also, some tremors. Takes Xanax and this helps. Also c/o sweating.  Reviewed history: She was told she had thyroid ds. and started on thyroid medications for "many years", then stopped long time ago.    She also has a history of thyroid nodules: Thyroid U/S (10/23/2018): 4 nodules, of which 1 met criteria for surveillance: COMPARISON:  05/28/2017 Parenchymal Echotexture: Mildly heterogenous Isthmus: 0.3 cm Right lobe: 4.0 cm x 1.7 cm x 1.3 cm Left lobe: 3.7 cm x 1.0 cm x 1.1 cm __________________________________________________   Nodule # 1: Location: Right; Superior Maximum size: 1.2 cm; Other 2 dimensions: 0.8 cm x 0.8 cm. Previous measurement is greatest 1.4 cm Composition: solid/almost completely solid (2) Echogenicity: isoechoic (1) Echogenic foci: punctate echogenic foci (3) Nodule meets criteria for surveillance _________________________________________________________   Nodule # 2: Location: Right; Inferior Maximum size: 0.6 cm; Other 2 dimensions: 0.4 cm x 0.3 cm Composition: spongiform  (0) Spongiform nodule does not meet criteria for surveillance or biopsy _______________________________________________________   Nodule # 3: Location: Left; Superior Maximum size: 1.0 cm; Other 2 dimensions: 0.8 cm x 0.4 cm Composition: cystic/almost completely cystic (0) Echogenicity: anechoic (0) Cystic nodule does not meet criteria for surveillance or biopsy _________________________________________________________   Nodule # 4: Location: Left; Inferior Maximum size: 0.6 cm; Other 2 dimensions: 0.4 cm x 0.3 cm Composition: spongiform (0) Spongiform nodule does not meet criteria for surveillance or biopsy  ____________________________________________________   No adenopathy   IMPRESSION: Right superior thyroid nodule (labeled 1) meets criteria for surveillance, as designated by the newly established ACR TI-RADS criteria. Surveillance ultrasound study recommended to be performed annually up to 5 years.   No other thyroid nodule meets criteria for biopsy or surveillance, as designated by the newly established ACR TI-RADS criteria.  Since last visit, she had another Thyroid U/S (11/20/2020): 2 dominant nodules of which 1 met criteria for surveillance:  Parenchymal Echotexture: Moderately heterogenous Isthmus: 0.2 cm Right lobe: 4.0 cm x 1.3 cm x 1.6 cm Left lobe: 3.6 cm x 1.1 cm x 1.4 cm  _________________________________________________________   Nodule labeled 1 in the superior right thyroid, measures slightly smaller than previous, now 1.3 cm. This remains TR 4 and meets criteria for surveillance   Nodule labeled 2, superior left thyroid, with cystic changes, TR 2) and does not meet criteria for surveillance or biopsy.   No adenopathy   IMPRESSION: Right superior thyroid nodule (labeled 1, TR 4, 1.3 cm) meets criteria for surveillance, as designated by the newly established ACR TI-RADS criteria. Surveillance ultrasound study recommended to be performed annually up  to 5 years.  Pt denies: - feeling nodules in neck - hoarseness - dysphagia - choking - SOB with lying down  I reviewed pt's thyroid tests: Lab Results  Component Value Date   TSH 1.28 11/08/2020   TSH 3.18 05/25/2020   TSH 1.69 11/06/2019   TSH 2.08 08/07/2019   TSH 1.49 05/06/2019   TSH 3.25 01/01/2019   TSH 1.90 10/03/2018   TSH 2.24 07/03/2018   TSH 1.85 04/01/2018   TSH 2.14 02/19/2018   FREET4 1.0 01/01/2019    Her thyroid antibodies were not elevated: Component     Latest Ref Rng & Units 01/01/2019  Thyroperoxidase Ab SerPl-aCnc     <9 IU/mL <1  Thyroglobulin Ab     < or = 1 IU/mL <1   She was on 10,000 mcg Biotin daily but she came off in the past before tests.  At last visit she complained of: - + fatigue, but she is usually very active - + hair loss - + irritability - + heat intolerance + sweating - + occasionally  tremors - + occasionally palpitations - + both anxiety/depression - chronic - no hyperdefecation/+ constipation - no signif. weight loss/weight gain - + dry skin - chronic  She continues to have most of the above symptoms.  No FH of thyroid ds.: daughter with hypothyroidism. No FH of thyroid cancer. No h/o radiation tx to head or neck.  No steroid use. No herbal supplements.   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  ROS: + see HPI  Past Medical History:  Diagnosis Date   Anxiety and depression    Arthritis    Colon polyps    GERD (gastroesophageal reflux disease)    Hemorrhoids    Hiatal hernia    History of kidney stones    passed   Hyperlipemia    Mixed hyperlipidemia 11/26/2013   Panic disorder    Passive smoke exposure 07/17/2018   Renal insufficiency    stage 3 kidney disease per her PCP   Stomach ulcer    Unspecified essential hypertension 11/26/2013   Past Surgical History:  Procedure Laterality Date   ABDOMINAL HYSTERECTOMY     CARPOMETACARPEL SUSPENSION  PLASTY Right 12/08/2013   Procedure: CARPOMETACARPEL [facility]) SUSPENSION PLASTY;  Surgeon: Jolyn Nap, MD;  Location: [facility];  Service: Orthopedics;  Laterality: Right;   CATARACT EXTRACTION Right 2020   Dr. Pandora Leiter    COLONOSCOPY     FOOT SURGERY Left 2008   Bunion and pulled tendons   HAND SURGERY Right    Dog Bite   I & D EXTREMITY Right 01/20/2017   Procedure: RIGHT HAND IRRIGATION AND DEBRIDEMENT AND REPAIR AS INDICATED;  Surgeon: Iran Planas, MD;  Location: [facility];  Service: Orthopedics;  Laterality: Right;   OOPHORECTOMY Right    TUBAL LIGATION     UPPER GASTROINTESTINAL ENDOSCOPY     Social History   Socioeconomic History   Marital status: Married    Spouse name: Not on file   Number of children: 2   Years of education: Not on file   Highest education level: Not on file  Occupational History   Occupation: Housewife  Tobacco Use   Smoking status: Never    Passive exposure: Yes   Smokeless tobacco: Never   Tobacco comments:    Husband & Father smoked  Vaping Use   Vaping Use: Never used  Substance and  Sexual Activity   Alcohol use: No    Alcohol/week: 0.0 standard drinks   Drug use: No   Sexual activity: Not Currently    Partners: Male    Birth control/protection: Post-menopausal  Other Topics Concern   Not on file  Social History Narrative   Originally from [place]. Has always lived in [place]. Previously has traveled to [place]. No international travel. Enjoys planting gardens and working in her flowers. 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 No bird, mold, or hot tub exposure.    Social Determinants of Health   Financial Resource Strain: Not on file  Food Insecurity: Not on file  Transportation Needs: Not on file  Physical Activity: Not on file  Stress: Not on file  Social Connections: Not on file  Intimate Partner Violence: Not on file   Current Outpatient Medications on File Prior to Visit  Medication Sig Dispense Refill    ALPRAZolam (XANAX) 0.5 MG tablet Take     1/2  to 1 tablet        2 to 3 x /day         as needed for Nerves 60 tablet 0   aspirin 81 MG tablet Take 81 mg by mouth at bedtime.      azithromycin (ZITHROMAX) 250 MG tablet Take 2 tablets (500 mg) on  Day 1,  followed by 1 tablet (250 mg) once daily on Days 2 through 5. 6 each 1   cholecalciferol (VITAMIN D) 1000 units tablet Take 10,000 Units by mouth daily.     diltiazem (CARDIZEM) 120 MG tablet Take  1 tablet  2 x /day (every 12 hours)  for BP 180 tablet 1   famotidine (PEPCID) 20 MG tablet 20 mg daily. As needed.     furosemide (LASIX) 20 MG tablet Take 1 tablet (20 mg total) by mouth daily as needed for edema. 90 tablet 1   gabapentin (NEURONTIN) 300 MG capsule Take 1 capsule  3 x /day  for Pain 90 capsule 0   meloxicam (MOBIC) 15 MG tablet Take 1 tablet (15 mg total) by mouth daily. 15 tablet 0   Mouthwash Compounding Base LIQD Take 5 mLs by mouth every 2 (two) hours as needed. 240 mL 1   polyethylene glycol powder (GLYCOLAX/MIRALAX) 17 GM/SCOOP powder Take 17 g by mouth daily. 255 g 0   potassium chloride SA (KLOR-CON) 20 MEQ tablet TAKE 1 TABLET BY MOUTH TWICE DAILY FOR POTASSIUM 180 tablet 3   pravastatin (PRAVACHOL) 40 MG tablet Take  1 tablet  at Bedtime  for Cholesterol 90 tablet 3   sertraline (ZOLOFT) 100 MG tablet TAKE 1 TABLET BY MOUTH DAILY FOR MOOD WITH FOOD 90 tablet 3   traZODone (DESYREL) 150 MG tablet Take 1/3 to 1/2 to 1 tablet 1 hour before Bedtime as needed for Sleep 90 tablet 1   No current facility-administered medications on file prior to visit.   Allergies  Allergen Reactions   Other     All pain medications: Unknown/ CODEINE   Prednisone Other (See Comments)    DYSPHORIA   Vioxx [Rofecoxib]     unknown   Entex T [Pseudoephedrine-Guaifenesin] Palpitations   Levaquin [Levofloxacin] Rash    Tolerated Avelox without adverse effect 11/08/15.   Sulfa Antibiotics Rash   Trovan [Alatrofloxacin] Rash   Family History   Problem Relation Age of Onset   Heart disease Brother        x2   Pancreatic cancer Brother  Dementia Brother    Heart attack Brother        56s-50s, was on drugs   Heart disease Father    Asthma Mother    Heart Problems Maternal Grandfather    CVA Brother    Other Brother        "lots of health problems" - unsure specifics   Tuberculosis Daughter    Colon cancer Neg Hx    Stomach cancer Neg Hx    Breast cancer Neg Hx     PE: BP 130/88 (BP Location: Right Arm, Patient Position: Sitting, Cuff Size: Normal)   Pulse 68   Ht 5' 3.5" (1.613 m)   Wt 125 lb 6.4 oz (56.9 kg)   SpO2 97%   BMI 21.87 kg/m  Wt Readings from Last 3 Encounters:  05/13/21 125 lb 6.4 oz (56.9 kg)  05/05/21 124 lb 12.8 oz (56.6 kg)  04/25/21 125 lb 9.6 oz (57 kg)   Constitutional:  Normal weight, in NAD Eyes: PERRLA, EOMI, no exophthalmos ENT: moist mucous membranes, no thyromegaly, no cervical lymphadenopathy Cardiovascular: RRR, No MRG Respiratory: CTA B Gastrointestinal: abdomen soft, NT, ND, BS+ Musculoskeletal: no deformities, strength intact in all 4;  Skin: moist, warm, no rashes Neurological: + tremor with outstretched hands, DTR normal in all 4  ASSESSMENT: 1. Thyroid nodules  2. H/o thyroid ds.  PLAN: 1. Thyroid nodule -Patient with history of several thyroid nodules, returning after an absence of 2 years and 3 months.  Before last visit, she had a thyroid ultrasound in 10/2018 that showed several small nodules.  They were not hypoechoic, except the small cysts), without microcalcifications, without internal blood flow, more wide than tall, and well delimited from the surrounding tissues, and therefore low risk.  She had another thyroid ultrasound in 11/2020 which showed that the dominant right superior thyroid nodule was fairly stable in size and there was a recommendation to continue to follow this.  The rest of the nodules were not worrisome.  Particularly, the left superior thyroid  nodule which had cystic changes did not require any further follow-up. -She does not have a thyroid cancer family history or personal history of radiation therapy to head or neck, favoring benignity. -She also denies neck compression symptoms -We will plan to repeat another ultrasound in a year from the previous, but no other intervention is needed - RTC in 1 year  2. H/o thyroid ds. -It is unclear what type of thyroid disease she had -She remembered that she has been on thyroid medication in the past but now off for many years -Reviewed her previous TFTs and they were all normal, including at last check in 11/2020, when she had a TSH of 1.28 -I reassured her that the thyroid appears to be functioning well, however, because she has insomnia, tremors, increased sweating, we will recheck her TFTs.  Component     Latest Ref Rng & Units 05/13/2021  TSH     0.35 - 5.50 uIU/mL 2.22  T4,Free(Direct)     0.60 - 1.60 ng/dL 0.71  Triiodothyronine,Free,Serum     2.3 - 4.2 pg/mL 2.6  Normal TFTs.  [name], MD PhD [facility]

## 2021-05-25 NOTE — Progress Notes (Signed)
AWV and follow up  Assessment and Plan:   Annual Medicare Wellness Visit Due annually  Health maintenance reviewed - requested covid 19 vaccine record  Essential hypertension - continue medications, DASH diet, exercise and monitor at home. Call if greater than 130/80.  -     CBC with Differential/Platelet -     CMP/GFR -     TSH - defer, just had  Mixed hyperlipidemia -continue medications, check lipids, decrease fatty foods, increase activity.  -     Lipid panel  Aortic atherosclerosis ([place]) - per CT 02/2015 Control blood pressure, cholesterol, glucose, increase exercise.   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 Cruzita Lederer is now following annually  -Recommended 5 years of annual thyroid US  CKD (chronic kidney disease) stage 3, GFR 30-59 ml/min (HCC) Increase fluids, avoid NSAIDS, monitor sugars, will monitor  Other abnormal glucose Discussed general issues about diabetes pathophysiology and management., Educational material distributed., Suggested low cholesterol diet., Encouraged aerobic exercise., Discussed foot care., Reminded to get yearly retinal exam.  Hiatal hernia Continue GERD medication, follow up GI as needed Encouraged small meals, avoid fatty meals, avoid lying down after meals   Chronic rhinitis Allergy pill daily, nasal sprays, increase H20, allergy hygiene explained.  Gastroesophageal reflux disease, esophagitis presence not specified/LPR Continue H2 blocker, Add diet discussed  Generalized anxiety disorder Continue medications; doing well limiting benzo  stress management techniques discussed, increase water, good sleep hygiene discussed, increase exercise, and increase veggies.   Vitamin D deficiency At goal at recent check; continue to recommend supplementation for goal of 70-100 Defer vitamin D level  Chronic cough syndrome Continue GERD meds, follow up Dr. Melvyn Novas as needed.   Osteoporosis DEXA  due 06/2021, - has scheduled Has been declining alendronate, would consider pending results, otherwise will defer to Dr. Cruzita Lederer for consideration of prolia Discussed Vit D with K2 supplement and Calcium rich diet, weight bearing exercises  Need for influenza vaccine High dose quadrivalent administered without complication today     Orders Placed This Encounter  Procedures   CBC with Differential/Platelet   COMPLETE METABOLIC PANEL WITH GFR   Magnesium   Lipid panel   VITAMIN D 25 Hydroxy (Vit-D Deficiency, Fractures)      Over 30 minutes of face to face exam, counseling, chart review and critical decision making was performed Future Appointments  Date Time Provider [place]  06/23/2021  9:00 AM [facility] DX DEXA 1 [facility]  11/17/2021 11:00 AM Unk Pinto, MD [facility] None  05/19/2022  2:40 PM [name], MD [facility] None  05/30/2022 10:30 AM [name], NP [facility] None    Plan:   During the course of the visit the patient was educated and counseled about appropriate screening and preventive services including:   Pneumococcal vaccine  Prevnar 13 Influenza vaccine Td vaccine Screening electrocardiogram Bone densitometry screening Colorectal cancer screening Diabetes screening Glaucoma screening Nutrition counseling  Advanced directives: requested    Subjective:  Latoya [name] is a 70 y.o. female who presents for AWV and follow up. She has Esophageal reflux; Generalized anxiety disorder; Chronic cough; Essential hypertension; Hyperlipidemia, mixed; Abnormal glucose; Vitamin D deficiency; Medication management; Aortic atherosclerosis ([place]) by Abd CT scan 02/24/2015; CKD (chronic kidney disease) stage 3, GFR 30-59 ml/min ([place]); Hiatal hernia; Chronic rhinitis; Laryngopharyngeal reflux (LPR); Multiple lentigines syndrome ([place]); FHx: heart disease; Osteoporosis; Recurrent acute serous otitis media of right ear; History of eustachian  tube dysfunction; TB lung, latent; Tenderness of both  temporomandibular joints; Multiple thyroid nodules; and H/O thyroid disease on their problem list.    Husband passed in 2021 with late stage lung disease, has 2 grown daughters.  She  has a diagnosis of depression/anxiety and is currently on zoloft 100 mg daily, xanax 0.5 mg PRN, use varies, avoids taking daily. She feels doing better recently.   She has GERD/LPR, had EGD 10/2017, on pepcid 40 mg daily per Dr. Carlean Purl; she also has dx of chronic cough syndrome per Dr. Melvyn Novas after extensive workup for persistent cough. She was notably evaluated by [facility] Dr. Pietro Cassis 05/12/2020 due to recurrent serous otitis and ear complaints; he diagnosed with eustachian tube dysfunction. He recommended she utilize Flonase and astalin nasal sprays PRN. She admits hasn't been using allergy meds, but does have at home to start if sx are bothersome.   BMI is Body mass index is 21.24 kg/m., she is very active around yard but admits appetite hasn't been good, forgets to eat. Knows she needs to do better.  Wt Readings from Last 3 Encounters:  05/26/21 121 lb 12.8 oz (55.2 kg)  05/13/21 125 lb 6.4 oz (56.9 kg)  05/05/21 124 lb 12.8 oz (56.6 kg)   Her blood pressure has been controlled at home, today their BP is BP: 128/74  She does workout, walking, yard work, house work. She denies chest pain, shortness of breath, dizziness.  She has aortic atherosclerosis per CT 02/2015.    She is on cholesterol medication (pravastatin 40 mg daily) and denies myalgias. Her cholesterol is at LDL goal. The cholesterol last visit was:   Lab Results  Component Value Date   CHOL 161 11/08/2020   HDL 53 11/08/2020   LDLCALC 88 11/08/2020   TRIG 100 11/08/2020   CHOLHDL 3.0 11/08/2020    She has been working on diet and exercise for glucose management, and denies paresthesia of the feet, polydipsia, polyuria and visual disturbances. Last A1C in the office was:  Lab Results   Component Value Date   HGBA1C 5.3 11/08/2020   Has had CKD IIIa range predating 2015 per lab review, though last check did improve to stage 2 range with pushing fluids, generally admits to poor water intake. Last GFR: Lab Results  Component Value Date   GFRNONAA >60 12/25/2020   GFRNONAA 58 (L) 12/23/2020   GFRNONAA 50 (L) 11/08/2020   Patient is on Vitamin D supplement for deficency.   Lab Results  Component Value Date   VD25OH 39 11/08/2020     She has multinodular thyroid, last [place] 11/2020, now following with Dr. Cruzita Lederer, planning annual [place] follow up. Recent thyroid panel was normal.  Lab Results  Component Value Date   TSH 2.22 05/13/2021     Medication Review: Current Outpatient Medications on File Prior to Visit  Medication Sig   ALPRAZolam (XANAX) 0.5 MG tablet Take     1/2  to 1 tablet        2 to 3 x /day         as needed for Nerves   aspirin 81 MG tablet Take 81 mg by mouth at bedtime.    cholecalciferol (VITAMIN D) 1000 units tablet Take 10,000 Units by mouth daily.   diltiazem (CARDIZEM) 120 MG tablet Take  1 tablet  2 x /day (every 12 hours)  for BP   famotidine (PEPCID) 20 MG tablet 20 mg daily. As needed.   furosemide (LASIX) 20 MG tablet Take 1 tablet (20 mg total) by  mouth daily as needed for edema.   gabapentin (NEURONTIN) 300 MG capsule Take 1 capsule  3 x /day  for Pain   meloxicam (MOBIC) 15 MG tablet Take 1 tablet (15 mg total) by mouth daily. (Patient taking differently: Take 15 mg by mouth as needed.)   Mouthwash Compounding Base LIQD Take 5 mLs by mouth every 2 (two) hours as needed.   polyethylene glycol powder (GLYCOLAX/MIRALAX) 17 GM/SCOOP powder Take 17 g by mouth daily.   potassium chloride SA (KLOR-CON) 20 MEQ tablet TAKE 1 TABLET BY MOUTH TWICE DAILY FOR POTASSIUM   pravastatin (PRAVACHOL) 40 MG tablet Take  1 tablet  at Bedtime  for Cholesterol   sertraline (ZOLOFT) 100 MG tablet TAKE 1 TABLET BY MOUTH DAILY FOR MOOD WITH FOOD   No current  facility-administered medications on file prior to visit.     Allergies  Allergen Reactions   Other     All pain medications: Unknown/ CODEINE   Prednisone Other (See Comments)    DYSPHORIA   Vioxx [Rofecoxib]     unknown   Entex T [Pseudoephedrine-Guaifenesin] Palpitations   Levaquin [Levofloxacin] Rash    Tolerated Avelox without adverse effect 11/08/15.   Sulfa Antibiotics Rash   Trovan [Alatrofloxacin] Rash    Current Problems (verified) Patient Active Problem List   Diagnosis Date Noted   Multiple thyroid nodules 05/13/2021   H/O thyroid disease 05/13/2021   Tenderness of both temporomandibular joints 10/13/2020   TB lung, latent 06/21/2020   Recurrent acute serous otitis media of right ear 05/19/2020   History of eustachian tube dysfunction 05/19/2020   Osteoporosis 06/23/2019   FHx: heart disease 11/27/2017   Multiple lentigines syndrome ([place]) 06/20/2017   Laryngopharyngeal reflux (LPR) 10/12/2016   Chronic rhinitis 10/09/2016   Hiatal hernia 07/14/2016   Aortic atherosclerosis ([place]) by Abd CT scan 02/24/2015 06/30/2016   CKD (chronic kidney disease) stage 3, GFR 30-59 ml/min (HCC) 06/30/2016   Abnormal glucose 05/01/2014   Vitamin D deficiency 05/01/2014   Medication management 05/01/2014   Essential hypertension 11/26/2013   Hyperlipidemia, mixed 11/26/2013   Chronic cough 09/19/2013   Generalized anxiety disorder 07/11/2013   Esophageal reflux 11/29/2012    Screening Tests Immunization History  Administered Date(s) Administered   Influenza Split 06/04/2014, 06/05/2015   Influenza, High Dose Seasonal PF 05/30/2017, 05/22/2018, 05/21/2019   Influenza-Unspecified 06/06/2013, 06/13/2016   Pneumococcal Conjugate-13 06/05/2015   Pneumococcal Polysaccharide-23 04/06/2014   Pneumococcal-Unspecified 09/04/1996, 09/04/2016   Td 09/04/2005   Tdap 03/22/2017   Preventative care: Last colonoscopy: 12/2012 Carlean Purl, 10 year follow up EGD 10/2017  Last mammogram:  06/23/2020 Last pap smear/pelvic exam: 2016 normal  DEXA: 06/2019 osteoporosis, T -2.7, fosamax was initiated but admits hasn't been taking. Has follow up DEXA scheduled 06/23/2021.   US thyroid 11/19/2020, multinodular thyroid, Dr. Cruzita Lederer now following with annual [place]  Prior vaccinations: TD or Tdap: 2018  Influenza: 2020 TODAY Pneumococcal: 2015 Prevnar13: 2016 Shingles/Zostavax: check with insurance  Covid 19: has had 2/2, 2021, + booster - record requested  Names of Other Physician/Practitioners you currently use: 1. [facility] here for primary care 2. [facility], eye doctor, last visit 2021, has schedulee 12/2020 3. [facility], dentist, last visit 2020, Due, encouraged to schedule   Patient Care Team: Unk Pinto, MD as PCP - General (Internal Medicine) Aquilla Hacker, MD as Referring Physician (Psychiatry) Jannette Spanner, MD as Referring Physician (Dermatology)  SURGICAL HISTORY She  has a past surgical history that includes  Abdominal hysterectomy; Tubal ligation; Foot surgery (Left, 2008); Colonoscopy; Upper gastrointestinal endoscopy; Oophorectomy (Right); Carpometacarpel [facility]) suspension plasty (Right, 12/08/2013); I & D extremity (Right, 01/20/2017); Hand surgery (Right); and Cataract extraction (Right, 2020). FAMILY HISTORY Her family history includes Asthma in her mother; CVA in her brother; Dementia in her brother; Heart Problems in her maternal grandfather; Heart attack in her brother; Heart disease in her brother and father; Other in her brother; Pancreatic cancer in her brother; Tuberculosis in her daughter. SOCIAL HISTORY She  reports that she has never smoked. She has been exposed to tobacco smoke. She has never used smokeless tobacco. She reports that she does not drink alcohol and does not use drugs.  Depression/mood screen:   Depression screen [facility] 2/9 05/26/2021  Decreased Interest 0  Down, Depressed, Hopeless 1  PHQ  - 2 Score 1  Altered sleeping -  Tired, decreased energy -  Change in appetite -  Feeling bad or failure about yourself  -  Trouble concentrating -  Moving slowly or fidgety/restless -  Suicidal thoughts -  PHQ-9 Score -  Difficult doing work/chores -  Some recent data might be hidden      Review of Systems  Constitutional:  Negative for malaise/fatigue and weight loss.  HENT:  Positive for hearing loss (R, has number to schedule for hearing aids). Negative for tinnitus.   Eyes:  Negative for blurred vision and double vision.  Respiratory:  Negative for cough, shortness of breath and wheezing.   Cardiovascular:  Negative for chest pain, palpitations, orthopnea, claudication and leg swelling.  Gastrointestinal:  Negative for abdominal pain, blood in stool, constipation, diarrhea, heartburn, melena, nausea and vomiting.  Genitourinary: Negative.   Musculoskeletal:  Negative for joint pain and myalgias.  Skin:  Negative for rash.  Neurological:  Negative for dizziness, tingling, sensory change, weakness and headaches.  Endo/Heme/Allergies:  Positive for environmental allergies (mild). Negative for polydipsia.  Psychiatric/Behavioral: Negative.  Negative for depression (improved with meds), memory loss, substance abuse and suicidal ideas. The patient is not nervous/anxious and does not have insomnia.   All other systems reviewed and are negative.  Objective:     Today's Vitals   05/26/21 1037 05/26/21 1101  BP: (!) 142/80 128/74  Pulse: 80   Temp: 97.7 F (36.5 C)   SpO2: 99%   Weight: 121 lb 12.8 oz (55.2 kg)   Height: 5' 3.5" (1.613 m)    Body mass index is 21.24 kg/m.  General appearance: alert, no distress, WD/WN, female HEENT: normocephalic, sclerae anicteric, TMs pearly, nares patent, no discharge or erythema, pharynx normal Oral cavity: MMM, no lesions Neck: supple, no lymphadenopathy, no thyromegaly, no masses Heart: RRR, normal S1, S2, no murmurs Lungs: CTA  bilaterally, no wheezes, rhonchi, or rales Abdomen: +bs, soft, non-tender, non distended, no masses, no hepatomegaly,  Splenomegaly noted.   Musculoskeletal: Full ROM all extremities, nontender, no swelling, no obvious deformity. Non-antalgic gait.  Extremities: no edema, no cyanosis, no clubbing Pulses: 2+ symmetric, upper and lower extremities, normal cap refill Neurological: alert, oriented x 3, CN2-12 intact, strength normal upper extremities and lower extremities, sensation normal throughout, DTRs 2+  no cerebellar signs. Psychiatric: Normal affect, behavior normal, pleasant  Breasts: declines GU: no concerns, defer  The patient's weight, height, BMI, and visual acuity have been recorded in the chart.  I have made referrals, counseling, and provided education to the patient based on review of the above and I have provided the patient with a written personalized care plan  for preventive services.     Izora Ribas, NP 11:35 AM [name]

## 2021-05-26 ENCOUNTER — Ambulatory Visit (INDEPENDENT_AMBULATORY_CARE_PROVIDER_SITE_OTHER): Payer: Medicare Other | Admitting: Adult Health

## 2021-05-26 ENCOUNTER — Other Ambulatory Visit: Payer: Self-pay

## 2021-05-26 ENCOUNTER — Encounter: Payer: Self-pay | Admitting: Adult Health

## 2021-05-26 VITALS — BP 128/74 | HR 80 | Temp 97.7°F | Ht 63.5 in | Wt 121.8 lb

## 2021-05-26 DIAGNOSIS — E042 Nontoxic multinodular goiter: Secondary | ICD-10-CM

## 2021-05-26 DIAGNOSIS — K449 Diaphragmatic hernia without obstruction or gangrene: Secondary | ICD-10-CM

## 2021-05-26 DIAGNOSIS — M810 Age-related osteoporosis without current pathological fracture: Secondary | ICD-10-CM | POA: Diagnosis not present

## 2021-05-26 DIAGNOSIS — R6889 Other general symptoms and signs: Secondary | ICD-10-CM | POA: Diagnosis not present

## 2021-05-26 DIAGNOSIS — L814 Other melanin hyperpigmentation: Secondary | ICD-10-CM

## 2021-05-26 DIAGNOSIS — K219 Gastro-esophageal reflux disease without esophagitis: Secondary | ICD-10-CM

## 2021-05-26 DIAGNOSIS — I10 Essential (primary) hypertension: Secondary | ICD-10-CM | POA: Diagnosis not present

## 2021-05-26 DIAGNOSIS — Z8669 Personal history of other diseases of the nervous system and sense organs: Secondary | ICD-10-CM | POA: Diagnosis not present

## 2021-05-26 DIAGNOSIS — J310 Chronic rhinitis: Secondary | ICD-10-CM | POA: Diagnosis not present

## 2021-05-26 DIAGNOSIS — N1831 Chronic kidney disease, stage 3a: Secondary | ICD-10-CM

## 2021-05-26 DIAGNOSIS — R7309 Other abnormal glucose: Secondary | ICD-10-CM

## 2021-05-26 DIAGNOSIS — Z0001 Encounter for general adult medical examination with abnormal findings: Secondary | ICD-10-CM | POA: Diagnosis not present

## 2021-05-26 DIAGNOSIS — Z0000 Encounter for general adult medical examination without abnormal findings: Secondary | ICD-10-CM

## 2021-05-26 DIAGNOSIS — I700 Atherosclerosis of aorta: Secondary | ICD-10-CM

## 2021-05-26 DIAGNOSIS — E559 Vitamin D deficiency, unspecified: Secondary | ICD-10-CM

## 2021-05-26 DIAGNOSIS — E782 Mixed hyperlipidemia: Secondary | ICD-10-CM

## 2021-05-26 DIAGNOSIS — F411 Generalized anxiety disorder: Secondary | ICD-10-CM

## 2021-05-26 DIAGNOSIS — Z23 Encounter for immunization: Secondary | ICD-10-CM

## 2021-05-26 DIAGNOSIS — Q858 Other phakomatoses, not elsewhere classified: Secondary | ICD-10-CM

## 2021-05-26 DIAGNOSIS — Q8789 Other specified congenital malformation syndromes, not elsewhere classified: Secondary | ICD-10-CM

## 2021-05-26 DIAGNOSIS — Z79899 Other long term (current) drug therapy: Secondary | ICD-10-CM

## 2021-05-26 NOTE — Addendum Note (Signed)
Addended by: Chancy Hurter on: 05/26/2021 12:19 PM   Modules accepted: Orders

## 2021-05-26 NOTE — Patient Instructions (Addendum)
      Look for vitamin D supplement with vitamin K2.   Check with insurance if they cover the shingles vaccine - shingrix Can get at [facility] or [facility] - 2 shots 2-6 months   Please have [facility] send me your covid 19 vaccine record   Please schedule overdue dental appointment

## 2021-05-27 ENCOUNTER — Telehealth: Payer: Self-pay

## 2021-05-27 LAB — LIPID PANEL
Cholesterol: 178 mg/dL (ref <200)
HDL: 65 mg/dL (ref >=50)
LDL Cholesterol (Calc): 94 mg/dL (calc)
Non-HDL Cholesterol (Calc): 113 mg/dL (calc) (ref <130)
Total CHOL/HDL Ratio: 2.7 (calc) (ref <5.0)
Triglycerides: 92 mg/dL (ref <150)

## 2021-05-27 LAB — CBC WITH DIFFERENTIAL/PLATELET
Absolute Monocytes: 501 cells/uL (ref 200–950)
Basophils Absolute: 22 cells/uL (ref 0–200)
Basophils Relative: 0.4 %
Eosinophils Absolute: 83 cells/uL (ref 15–500)
Eosinophils Relative: 1.5 %
HCT: 42.8 % (ref 35.0–45.0)
Hemoglobin: 13.9 g/dL (ref 11.7–15.5)
Lymphs Abs: 1414 cells/uL (ref 850–3900)
MCH: 29.7 pg (ref 27.0–33.0)
MCHC: 32.5 g/dL (ref 32.0–36.0)
MCV: 91.5 fL (ref 80.0–100.0)
MPV: 9.8 fL (ref 7.5–12.5)
Monocytes Relative: 9.1 %
Neutro Abs: 3482 cells/uL (ref 1500–7800)
Neutrophils Relative %: 63.3 %
Platelets: 163 10*3/uL (ref 140–400)
RBC: 4.68 10*6/uL (ref 3.80–5.10)
RDW: 13.5 % (ref 11.0–15.0)
Total Lymphocyte: 25.7 %
WBC: 5.5 10*3/uL (ref 3.8–10.8)

## 2021-05-27 LAB — COMPLETE METABOLIC PANEL WITH GFR
AG Ratio: 2.1 (calc) (ref 1.0–2.5)
ALT: 15 U/L (ref 6–29)
AST: 23 U/L (ref 10–35)
Albumin: 4.1 g/dL (ref 3.6–5.1)
Alkaline phosphatase (APISO): 56 U/L (ref 37–153)
BUN/Creatinine Ratio: 19 (calc) (ref 6–22)
BUN: 19 mg/dL (ref 7–25)
CO2: 27 mmol/L (ref 20–32)
Calcium: 9.8 mg/dL (ref 8.6–10.4)
Chloride: 109 mmol/L (ref 98–110)
Creat: 1.02 mg/dL — ABNORMAL HIGH (ref 0.60–1.00)
Globulin: 2 g/dL (calc) (ref 1.9–3.7)
Glucose, Bld: 83 mg/dL (ref 65–99)
Potassium: 3.7 mmol/L (ref 3.5–5.3)
Sodium: 142 mmol/L (ref 135–146)
Total Bilirubin: 0.4 mg/dL (ref 0.2–1.2)
Total Protein: 6.1 g/dL (ref 6.1–8.1)
eGFR: 59 mL/min/{1.73_m2} — ABNORMAL LOW (ref >=60)

## 2021-05-27 LAB — MAGNESIUM: Magnesium: 2.3 mg/dL (ref 1.5–2.5)

## 2021-05-27 LAB — VITAMIN D 25 HYDROXY (VIT D DEFICIENCY, FRACTURES): Vit D, 25-Hydroxy: 85 ng/mL (ref 30–100)

## 2021-05-27 NOTE — Telephone Encounter (Signed)
Patient was recently tested for Hepatitis C. Her test result returned as Non-Reactive.

## 2021-05-31 ENCOUNTER — Telehealth: Payer: Self-pay

## 2021-05-31 NOTE — Telephone Encounter (Signed)
Patient states that her right ear is hurting, head is all congested. Requesting a referral for an ENT. Please advise.

## 2021-06-18 ENCOUNTER — Other Ambulatory Visit: Payer: Self-pay

## 2021-06-18 ENCOUNTER — Emergency Department (HOSPITAL_COMMUNITY)
Admission: EM | Admit: 2021-06-18 | Discharge: 2021-06-18 | Disposition: A | Discharge status: Home / Self Care | Payer: Medicare Other | Attending: Emergency Medicine | Admitting: Emergency Medicine

## 2021-06-18 ENCOUNTER — Emergency Department (HOSPITAL_COMMUNITY): Payer: Medicare Other

## 2021-06-18 DIAGNOSIS — Y92410 Unspecified street and highway as the place of occurrence of the external cause: Secondary | ICD-10-CM | POA: Insufficient documentation

## 2021-06-18 DIAGNOSIS — S161XXA Strain of muscle, fascia and tendon at neck level, initial encounter: Secondary | ICD-10-CM | POA: Diagnosis not present

## 2021-06-18 DIAGNOSIS — N183 Chronic kidney disease, stage 3 unspecified: Secondary | ICD-10-CM | POA: Diagnosis not present

## 2021-06-18 DIAGNOSIS — S0990XA Unspecified injury of head, initial encounter: Secondary | ICD-10-CM | POA: Diagnosis not present

## 2021-06-18 DIAGNOSIS — Z79899 Other long term (current) drug therapy: Secondary | ICD-10-CM | POA: Insufficient documentation

## 2021-06-18 DIAGNOSIS — Z7982 Long term (current) use of aspirin: Secondary | ICD-10-CM | POA: Diagnosis not present

## 2021-06-18 DIAGNOSIS — I129 Hypertensive chronic kidney disease with stage 1 through stage 4 chronic kidney disease, or unspecified chronic kidney disease: Secondary | ICD-10-CM | POA: Diagnosis not present

## 2021-06-18 DIAGNOSIS — G319 Degenerative disease of nervous system, unspecified: Secondary | ICD-10-CM | POA: Diagnosis not present

## 2021-06-18 DIAGNOSIS — S199XXA Unspecified injury of neck, initial encounter: Secondary | ICD-10-CM | POA: Diagnosis present

## 2021-06-18 DIAGNOSIS — M542 Cervicalgia: Secondary | ICD-10-CM | POA: Diagnosis not present

## 2021-06-18 MED ORDER — METHOCARBAMOL 500 MG PO TABS: 250.0000 mg | ORAL_TABLET | Freq: Three times a day (TID) | ORAL | 0 refills | Status: DC | PRN
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
## 2021-06-18 NOTE — ED Notes (Signed)
Patient transported to CT 
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
## 2021-06-21 DIAGNOSIS — H9201 Otalgia, right ear: Secondary | ICD-10-CM | POA: Diagnosis not present

## 2021-06-23 ENCOUNTER — Ambulatory Visit
Admission: RE | Admit: 2021-06-23 | Discharge: 2021-06-23 | Disposition: A | Discharge status: Home / Self Care | Payer: Medicare Other | Source: Ambulatory Visit | Attending: Adult Health | Admitting: Adult Health

## 2021-06-23 ENCOUNTER — Other Ambulatory Visit: Payer: Self-pay

## 2021-06-23 DIAGNOSIS — M8588 Other specified disorders of bone density and structure, other site: Secondary | ICD-10-CM | POA: Diagnosis not present

## 2021-06-23 DIAGNOSIS — M810 Age-related osteoporosis without current pathological fracture: Secondary | ICD-10-CM | POA: Diagnosis not present

## 2021-06-28 ENCOUNTER — Other Ambulatory Visit: Payer: Self-pay | Admitting: Adult Health

## 2021-06-28 DIAGNOSIS — M810 Age-related osteoporosis without current pathological fracture: Secondary | ICD-10-CM

## 2021-06-28 MED ORDER — ALENDRONATE SODIUM 70 MG PO TABS: 70.0000 mg | ORAL_TABLET | ORAL | 3 refills | Status: DC

## 2021-07-06 ENCOUNTER — Encounter: Payer: Self-pay | Admitting: Nurse Practitioner

## 2021-07-06 ENCOUNTER — Ambulatory Visit: Payer: Medicare Other | Admitting: Internal Medicine

## 2021-07-06 ENCOUNTER — Ambulatory Visit (INDEPENDENT_AMBULATORY_CARE_PROVIDER_SITE_OTHER): Payer: Medicare Other | Admitting: Nurse Practitioner

## 2021-07-06 ENCOUNTER — Other Ambulatory Visit: Payer: Self-pay

## 2021-07-06 VITALS — BP 140/90 | HR 94 | Temp 97.9°F | Wt 121.8 lb

## 2021-07-06 DIAGNOSIS — H6981 Other specified disorders of Eustachian tube, right ear: Secondary | ICD-10-CM

## 2021-07-06 DIAGNOSIS — R0781 Pleurodynia: Secondary | ICD-10-CM

## 2021-07-06 MED ORDER — DEXAMETHASONE 0.5 MG PO TABS: ORAL_TABLET | ORAL | 0 refills | Status: DC

## 2021-07-06 NOTE — Patient Instructions (Signed)
Eustachian Tube Dysfunction °Eustachian tube dysfunction refers to a condition in which a blockage develops in the narrow passage that connects the middle ear to the back of the nose (eustachian tube). The eustachian tube regulates air pressure in the middle ear by letting air move between the ear and nose. It also helps to drain fluid from the middle ear space. °Eustachian tube dysfunction can affect one or both ears. When the eustachian tube does not function properly, air pressure, fluid, or both can build up in the middle ear. °What are the causes? °This condition occurs when the eustachian tube becomes blocked or cannot open normally. Common causes of this condition include: °Ear infections. °Colds and other infections that affect the nose, mouth, and throat (upper respiratory tract). °Allergies. °Irritation from cigarette smoke. °Irritation from stomach acid coming up into the esophagus (gastroesophageal reflux). The esophagus is the part of the body that moves food from the mouth to the stomach. °Sudden changes in air pressure, such as from descending in an airplane or scuba diving. °Abnormal growths in the nose or throat, such as: °Growths that line the nose (nasal polyps). °Abnormal growth of cells (tumors). °Enlarged tissue at the back of the throat (adenoids). °What increases the risk? °You are more likely to develop this condition if: °You smoke. °You are overweight. °You are a child who has: °Certain birth defects of the mouth, such as cleft palate. °Large tonsils or adenoids. °What are the signs or symptoms? °Common symptoms of this condition include: °A feeling of fullness in the ear. °Ear pain. °Clicking or popping noises in the ear. °Ringing in the ear (tinnitus). °Hearing loss. °Loss of balance. °Dizziness. °Symptoms may get worse when the air pressure around you changes, such as when you travel to an area of high elevation, fly on an airplane, or go scuba diving. °How is this diagnosed? °This  condition may be diagnosed based on: °Your symptoms. °A physical exam of your ears, nose, and throat. °Tests, such as those that measure: °The movement of your eardrum. °Your hearing (audiometry). °How is this treated? °Treatment depends on the cause and severity of your condition. °In mild cases, you may relieve your symptoms by moving air into your ears. This is called "popping the ears." °In more severe cases, or if you have symptoms of fluid in your ears, treatment may include: °Medicines to relieve congestion (decongestants). °Medicines that treat allergies (antihistamines). °Nasal sprays or ear drops that contain medicines that reduce swelling (steroids). °A procedure to drain the fluid in your eardrum. In this procedure, a small tube may be placed in the eardrum to: °Drain the fluid. °Restore the air in the middle ear space. °A procedure to insert a balloon device through the nose to inflate the opening of the eustachian tube (balloon dilation). °Follow these instructions at home: °Lifestyle °Do not do any of the following until your health care provider approves: °Travel to high altitudes. °Fly in airplanes. °Work in a pressurized cabin or room. °Scuba dive. °Do not use any products that contain nicotine or tobacco. These products include cigarettes, chewing tobacco, and vaping devices, such as e-cigarettes. If you need help quitting, ask your health care provider. °Keep your ears dry. Wear fitted earplugs during showering and bathing. Dry your ears completely after. °General instructions °Take over-the-counter and prescription medicines only as told by your health care provider. °Use techniques to help pop your ears as recommended by your health care provider. These may include: °Chewing gum. °Yawning. °Frequent, forceful swallowing. °Closing   your mouth, holding your nose closed, and gently blowing as if you are trying to blow air out of your nose. °Keep all follow-up visits. This is important. °Contact a  health care provider if: °Your symptoms do not go away after treatment. °Your symptoms come back after treatment. °You are unable to pop your ears. °You have: °A fever. °Pain in your ear. °Pain in your head or neck. °Fluid draining from your ear. °Your hearing suddenly changes. °You become very dizzy. °You lose your balance. °Get help right away if: °You have a sudden, severe increase in any of your symptoms. °Summary °Eustachian tube dysfunction refers to a condition in which a blockage develops in the eustachian tube. °It can be caused by ear infections, allergies, inhaled irritants, or abnormal growths in the nose or throat. °Symptoms may include ear pain or fullness, hearing loss, or ringing in the ears. °Mild cases are treated with techniques to unblock the ears, such as yawning or chewing gum. °More severe cases are treated with medicines or procedures. °This information is not intended to replace advice given to you by your health care provider. Make sure you discuss any questions you have with your health care provider. °Document Revised: 11/01/2020 Document Reviewed: 11/01/2020 °Elsevier Patient Education © 2022 [employer] Inc[employer] ° °
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
## 2021-07-11 NOTE — Progress Notes (Deleted)
Future Appointments  Date Time Provider [place]  07/12/2021 11:30 AM Unk Pinto, MD [facility] None  11/17/2021     CPE  11:00 AM Unk Pinto, MD [facility] None  05/19/2022  2:40 PM [name], MD [facility] None  05/30/2022     Wellness 10:30 AM [name], NP [facility] None    History of Present Illness:     Patient is a very nice 70 yo WWF  with HTN, HLD, PreDM, Vit D Deficiency CKD3 wo presents with c/o "Dizziness".     Medications    alendronate (FOSAMAX) 70 MG tablet, Take 1 tablet (70 mg total) by mouth once a week. Take on empty stomach with full glass of water and avoid reclining for 30 min after taking med.   diltiazem (CARDIZEM) 120 MG tablet, Take  1 tablet  2 x /day (every 12 hours)  for BP   furosemide (LASIX) 20 MG tablet, Take 1 tablet (20 mg total) by mouth daily as needed for edema.   pravastatin (PRAVACHOL) 40 MG tablet, Take  1 tablet  at Bedtime  for Cholesterol   aspirin 81 MG tablet, Take 81 mg by mouth at bedtime.    meloxicam (MOBIC) 15 MG tablet, Take 1 tablet (15 mg total) by mouth daily. (Patient taking differently: Take 15 mg by mouth as needed.)   vitamin B-12 (CYANOCOBALAMIN) 250 MCG tablet, Take 250 mcg by mouth daily.  Current Outpatient Medications (Other):    ALPRAZolam (XANAX) 0.5 MG tablet, Take     1/2  to 1 tablet        2 to 3 x /day         as needed for Nerves   Ascorbic Acid (VITAMIN C) 1000 MG tablet, Take 1,000 mg by mouth daily.   cholecalciferol (VITAMIN D) 1000 units tablet, Take 10,000 Units by mouth daily.   famotidine (PEPCID) 20 MG tablet, 20 mg daily. As needed.   gabapentin (NEURONTIN) 300 MG capsule, Take 1 capsule  3 x /day  for Pain   methocarbamol (ROBAXIN) 500 MG tablet, Take 0.5-1 tablets (250-500 mg total) by mouth every 8 (eight) hours as needed for muscle spasms. (Patient not taking: Reported on 07/06/2021)   Mouthwash Compounding Base LIQD, Take 5 mLs by mouth every 2 (two) hours as  needed.   polyethylene glycol powder (GLYCOLAX/MIRALAX) 17 GM/SCOOP powder, Take 17 g by mouth daily. (Patient not taking: Reported on 07/06/2021)   potassium chloride SA (KLOR-CON) 20 MEQ tablet, TAKE 1 TABLET BY MOUTH TWICE DAILY FOR POTASSIUM   sertraline (ZOLOFT) 100 MG tablet, TAKE 1 TABLET BY MOUTH DAILY FOR MOOD WITH FOOD  Problem list She has Esophageal reflux; Generalized anxiety disorder; Chronic cough; Essential hypertension; Hyperlipidemia, mixed; Abnormal glucose; Vitamin D deficiency; Medication management; Aortic atherosclerosis ([place]) by Abd CT scan 02/24/2015; CKD (chronic kidney disease) stage 3, GFR 30-59 ml/min ([place]); Hiatal hernia; Chronic rhinitis; Laryngopharyngeal reflux (LPR); Multiple lentigines syndrome; FHx: heart disease; Osteoporosis; Recurrent acute serous otitis media of right ear; History of eustachian tube dysfunction; TB lung, latent; Tenderness of both temporomandibular joints; Multiple thyroid nodules; and H/O thyroid disease on their problem list.   Observations/Objective:  There were no vitals taken for this visit.  HEENT - WNL. Neck - supple.  Chest - Clear equal BS. Cor - Nl HS. RRR w/o sig MGR. PP 1(+). No edema. MS- FROM w/o deformities.  Gait Nl. Neuro -  Nl w/o focal abnormalities.   Assessment and Plan:  Follow Up Instructions:        I discussed the assessment and treatment plan with the patient. The patient was provided an opportunity to ask questions and all were answered. The patient agreed with the plan and demonstrated an understanding of the instructions.       The patient was advised to call back or seek an in-person evaluation if the symptoms worsen or if the condition fails to improve as anticipated.    Kirtland Bouchard, MD

## 2021-07-12 ENCOUNTER — Encounter: Payer: Self-pay | Admitting: Nurse Practitioner

## 2021-07-12 ENCOUNTER — Ambulatory Visit: Payer: Medicare Other | Admitting: Internal Medicine

## 2021-07-12 ENCOUNTER — Ambulatory Visit (INDEPENDENT_AMBULATORY_CARE_PROVIDER_SITE_OTHER): Payer: Medicare Other | Admitting: Nurse Practitioner

## 2021-07-12 ENCOUNTER — Other Ambulatory Visit: Payer: Self-pay

## 2021-07-12 VITALS — BP 140/90 | HR 97 | Temp 97.7°F | Wt 119.8 lb

## 2021-07-12 DIAGNOSIS — R42 Dizziness and giddiness: Secondary | ICD-10-CM

## 2021-07-12 DIAGNOSIS — J3089 Other allergic rhinitis: Secondary | ICD-10-CM | POA: Diagnosis not present

## 2021-07-12 MED ORDER — MECLIZINE HCL 25 MG PO TABS
ORAL_TABLET | ORAL | 0 refills | Status: DC
Start: 2021-07-12 — End: 2022-09-27

## 2021-07-12 NOTE — Patient Instructions (Addendum)
Use Mucinex DM every 12 hours as needed Change from Zyrtec to Allegra Allergic Rhinitis, Adult Allergic rhinitis is an allergic reaction that affects the mucous membrane inside the nose. The mucous membrane is the tissue that produces mucus. There are two types of allergic rhinitis: Seasonal. This type is also called hay fever and happens only during certain seasons. Perennial. This type can happen at any time of the year. Allergic rhinitis cannot be spread from person to person. This condition can be mild, moderate, or severe. It can develop at any age and may be outgrown. What are the causes? This condition is caused by allergens. These are things that can cause an allergic reaction. Allergens may differ for seasonal allergic rhinitis and perennial allergic rhinitis. Seasonal allergic rhinitis is triggered by pollen. Pollen can come from grasses, trees, and weeds. Perennial allergic rhinitis may be triggered by: Dust mites. Proteins in a pet's urine, saliva, or dander. Dander is dead skin cells from a pet. Smoke, mold, or car fumes. What increases the risk? You are more likely to develop this condition if you have a family history of allergies or other conditions related to allergies, including: Allergic conjunctivitis. This is inflammation of parts of the eyes and eyelids. Asthma. This condition affects the lungs and makes it hard to breathe. Atopic dermatitis or eczema. This is long term (chronic) inflammation of the skin. Food allergies. What are the signs or symptoms? Symptoms of this condition include: Sneezing or coughing. A stuffy nose (nasal congestion), itchy nose, or nasal discharge. Itchy eyes and tearing of the eyes. A feeling of mucus dripping down the back of your throat (postnasal drip). Trouble sleeping. Tiredness or fatigue. Headache. Sore throat. How is this diagnosed? This condition may be diagnosed with your symptoms, medical history, and physical exam. Your  health care provider may check for related conditions, such as: Asthma. Pink eye. This is eye inflammation caused by infection (conjunctivitis). Ear infection. Upper respiratory infection. This is an infection in the nose, throat, or upper airways. You may also have tests to find out which allergens trigger your symptoms. These may include skin tests or blood tests. How is this treated? There is no cure for this condition, but treatment can help control symptoms. Treatment may include: Taking medicines that block allergy symptoms, such as corticosteroids and antihistamines. Medicine may be given as a shot, nasal spray, or pill. Avoiding any allergens. Being exposed again and again to tiny amounts of allergens to help you build a defense against allergens (immunotherapy). This is done if other treatments have not helped. It may include: Allergy shots. These are injected medicines that have small amounts of allergen in them. Sublingual immunotherapy. This involves taking small doses of a medicine with allergen in it under your tongue. If these treatments do not work, your health care provider may prescribe newer, stronger medicines. Follow these instructions at home: Avoiding allergens Find out what you are allergic to and avoid those allergens. These are some things you can do to help avoid allergens: If you have perennial allergies: Replace carpet with wood, tile, or vinyl flooring. Carpet can trap dander and dust. Do not smoke. Do not allow smoking in your home. Change your heating and air conditioning filters at least once a month. If you have seasonal allergies, take these steps during allergy season: Keep windows closed as much as possible. Plan outdoor activities when pollen counts are lowest. Check pollen counts before you plan outdoor activities. When coming indoors, change clothing and  shower before sitting on furniture or bedding. If you have a pet in the house that produces  allergens: Keep the pet out of the bedroom. Vacuum, sweep, and dust regularly. General instructions Take over-the-counter and prescription medicines only as told by your health care provider. Drink enough fluid to keep your urine pale yellow. Keep all follow-up visits as told by your health care provider. This is important. Where to find more information American Academy of Allergy, Asthma & Immunology: www.aaaai.org Contact a health care provider if: You have a fever. You develop a cough that does not go away. You make whistling sounds when you breathe (wheeze). Your symptoms slow you down or stop you from doing your normal activities each day. Get help right away if: You have shortness of breath. This symptom may represent a serious problem that is an emergency. Do not wait to see if the symptom will go away. Get medical help right away. Call your local emergency services (911 in the [place] Do not drive yourself to the hospital. Summary Allergic rhinitis may be managed by taking medicines as directed and avoiding allergens. If you have seasonal allergies, keep windows closed as much as possible during allergy season. Contact your health care provider if you develop a fever or a cough that does not go away. This information is not intended to replace advice given to you by your health care provider. Make sure you discuss any questions you have with your health care provider. Document Revised: 10/10/2019 Document Reviewed: 08/19/2019 Elsevier Patient Education  2022 [employer].

## 2021-07-12 NOTE — Progress Notes (Signed)
Assessment and Plan: Latoya [name] was seen today for acute visit.  Diagnoses and all orders for this visit:  Non-seasonal allergic rhinitis, unspecified trigger Change from Zyrtec to Allegra Mucinex DM q 12 hours as needed Monitor sypmtoms  Dizziness -     meclizine (ANTIVERT) 25 MG tablet; 1/2-1 pill up to 3 times daily for motion sickness/dizziness Meclizine as needed for dizziness, advised can make drowsy- only take when going to be home      Further disposition pending results of labs. Discussed med's effects and SE's.   Over 30 minutes of exam, counseling, chart review, and critical decision making was performed.   Future Appointments  Date Time Provider [place]  11/17/2021 10:00 AM [name], NP [facility] None  05/19/2022  2:40 PM [name], MD [facility] None  05/30/2022 11:00 AM [name], NP [facility] None    ------------------------------------------------------------------------------------------------------------------   HPI BP 140/90   Pulse 97   Temp 97.7 F (36.5 C)   Wt 119 lb 12.8 oz (54.3 kg)   SpO2 97%   BMI 21.22 kg/m  70 y.o.female presents for dizziness  Pt states she was feeling better and then it returned with head congestion. Has dizziness associated with congestion. Mild cough, clear nasal drainage.  Denies headaches, sore throat, and wheezing  Blood pressure has been running in normal range BP Readings from Last 3 Encounters:  07/12/21 140/90  07/06/21 140/90  06/18/21 (!) 148/88      Past Medical History:  Diagnosis Date   Anxiety and depression    Arthritis    Colon polyps    GERD (gastroesophageal reflux disease)    Hemorrhoids    Hiatal hernia    History of kidney stones    passed   Hyperlipemia    Mixed hyperlipidemia 11/26/2013   Panic disorder    Passive smoke exposure 07/17/2018   Renal insufficiency    stage 3 kidney disease per her PCP   Stomach ulcer    Unspecified essential hypertension  11/26/2013     Allergies  Allergen Reactions   Other     All pain medications: Unknown/ CODEINE   Prednisone Other (See Comments)    DYSPHORIA   Vioxx [Rofecoxib]     unknown   Entex T [Pseudoephedrine-Guaifenesin] Palpitations   Levaquin [Levofloxacin] Rash    Tolerated Avelox without adverse effect 11/08/15.   Sulfa Antibiotics Rash   Trovan [Alatrofloxacin] Rash    Current Outpatient Medications on File Prior to Visit  Medication Sig   alendronate (FOSAMAX) 70 MG tablet Take 1 tablet (70 mg total) by mouth once a week. Take on empty stomach with full glass of water and avoid reclining for 30 min after taking med.   ALPRAZolam (XANAX) 0.5 MG tablet Take     1/2  to 1 tablet        2 to 3 x /day         as needed for Nerves   Ascorbic Acid (VITAMIN C) 1000 MG tablet Take 1,000 mg by mouth daily.   aspirin 81 MG tablet Take 81 mg by mouth at bedtime.    cholecalciferol (VITAMIN D) 1000 units tablet Take 10,000 Units by mouth daily.   diltiazem (CARDIZEM) 120 MG tablet Take  1 tablet  2 x /day (every 12 hours)  for BP   famotidine (PEPCID) 20 MG tablet 20 mg daily. As needed.   furosemide (LASIX) 20 MG tablet Take 1 tablet (20 mg total) by mouth daily as needed for  edema.   gabapentin (NEURONTIN) 300 MG capsule Take 1 capsule  3 x /day  for Pain   meloxicam (MOBIC) 15 MG tablet Take 1 tablet (15 mg total) by mouth daily. (Patient taking differently: Take 15 mg by mouth as needed.)   Mouthwash Compounding Base LIQD Take 5 mLs by mouth every 2 (two) hours as needed.   potassium chloride SA (KLOR-CON) 20 MEQ tablet TAKE 1 TABLET BY MOUTH TWICE DAILY FOR POTASSIUM   pravastatin (PRAVACHOL) 40 MG tablet Take  1 tablet  at Bedtime  for Cholesterol   sertraline (ZOLOFT) 100 MG tablet TAKE 1 TABLET BY MOUTH DAILY FOR MOOD WITH FOOD   vitamin B-12 (CYANOCOBALAMIN) 250 MCG tablet Take 250 mcg by mouth daily.   dexamethasone (DECADRON) 0.5 MG tablet Take 1 tablet PO TID for 3 days, then take 1  tablet PO BID for 3 days, then take 1 tablet PO for 5 days. (Patient not taking: Reported on 07/12/2021)   No current facility-administered medications on file prior to visit.    ROS: all negative except above.   Physical Exam:  BP 140/90   Pulse 97   Temp 97.7 F (36.5 C)   Wt 119 lb 12.8 oz (54.3 kg)   SpO2 97%   BMI 21.22 kg/m   General Appearance: Well nourished, in no apparent distress. Eyes: PERRLA, EOMs, conjunctiva no swelling or erythema Sinuses: No Frontal/maxillary tenderness ENT/Mouth: Ext aud canals clear, TMs without erythema, bulging. No erythema, swelling, or exudate on post pharynx.  Tonsils not swollen or erythematous. Hearing normal.  Neck: Supple, thyroid normal.  Respiratory: Respiratory effort normal, BS equal bilaterally without rales, rhonchi, wheezing or stridor.  Cardio: RRR with no MRGs. Brisk peripheral pulses without edema.  Abdomen: Soft, + BS.  Non tender, no guarding, rebound, hernias, masses. Lymphatics: Non tender without lymphadenopathy.  Musculoskeletal: Full ROM, 5/5 strength, normal gait.  Skin: Warm, dry without rashes, lesions, ecchymosis.  Neuro: Cranial nerves intact. Normal muscle tone, no cerebellar symptoms. Sensation intact.  Psych: Awake and oriented X 3, normal affect, Insight and Judgment appropriate.     [name], NP 11:58 AM [name]

## 2021-07-15 ENCOUNTER — Other Ambulatory Visit: Payer: Self-pay | Admitting: Nurse Practitioner

## 2021-07-15 DIAGNOSIS — R110 Nausea: Secondary | ICD-10-CM

## 2021-07-15 MED ORDER — ONDANSETRON HCL 4 MG PO TABS: 4.0000 mg | ORAL_TABLET | Freq: Every day | ORAL | 1 refills | Status: DC | PRN

## 2021-07-18 NOTE — Progress Notes (Signed)
Assessment and Plan:  Latoya [name] was seen today for acute visit.  Diagnoses and all orders for this visit:  Cellulitis of right hand -     doxycycline (ADOXA) 100 MG tablet; Take 1 tablet (100 mg total) by mouth 2 (two) times daily for 7 days.  If she is not noticing a decrease in redness within three days, she develops fever, bones/joints start to hurt, muscle aches/malaise she is to call the office.      Further disposition pending results of labs. Discussed med's effects and SE's.   Over 30 minutes of exam, counseling, chart review, and critical decision making was performed.   Future Appointments  Date Time Provider [place]  11/08/2021 10:00 AM [name], NP [facility] None  05/19/2022  2:40 PM [name], MD [facility] None  05/30/2022 11:00 AM [name], NP [facility] None    ------------------------------------------------------------------------------------------------------------------   HPI BP 114/60   Pulse 67   Temp (!) 97.5 F (36.4 C)   Wt 119 lb 12.8 oz (54.3 kg)   SpO2 99%   BMI 21.22 kg/m  70 y.o.female presents for Dog scratch right arm 1 week ago  One week ago her dog jumped up on her and scratched her right arm in 5 places, Scabbed over.  Her right lower arm and hand are red and swollen.  Past Medical History:  Diagnosis Date   Anxiety and depression    Arthritis    Colon polyps    GERD (gastroesophageal reflux disease)    Hemorrhoids    Hiatal hernia    History of kidney stones    passed   Hyperlipemia    Mixed hyperlipidemia 11/26/2013   Panic disorder    Passive smoke exposure 07/17/2018   Renal insufficiency    stage 3 kidney disease per her PCP   Stomach ulcer    Unspecified essential hypertension 11/26/2013     Allergies  Allergen Reactions   Other     All pain medications: Unknown/ CODEINE   Prednisone Other (See Comments)    DYSPHORIA   Vioxx [Rofecoxib]     unknown   Entex T [Pseudoephedrine-Guaifenesin]  Palpitations   Levaquin [Levofloxacin] Rash    Tolerated Avelox without adverse effect 11/08/15.   Sulfa Antibiotics Rash   Trovan [Alatrofloxacin] Rash    Current Outpatient Medications on File Prior to Visit  Medication Sig   alendronate (FOSAMAX) 70 MG tablet Take 1 tablet (70 mg total) by mouth once a week. Take on empty stomach with full glass of water and avoid reclining for 30 min after taking med.   ALPRAZolam (XANAX) 0.5 MG tablet Take     1/2  to 1 tablet        2 to 3 x /day         as needed for Nerves   Ascorbic Acid (VITAMIN C) 1000 MG tablet Take 1,000 mg by mouth daily.   aspirin 81 MG tablet Take 81 mg by mouth at bedtime.    cholecalciferol (VITAMIN D) 1000 units tablet Take 10,000 Units by mouth daily.   diltiazem (CARDIZEM) 120 MG tablet Take  1 tablet  2 x /day (every 12 hours)  for BP   famotidine (PEPCID) 20 MG tablet 20 mg daily. As needed.   furosemide (LASIX) 20 MG tablet Take 1 tablet (20 mg total) by mouth daily as needed for edema.   gabapentin (NEURONTIN) 300 MG capsule Take 1 capsule  3 x /day  for Pain   meclizine (  ANTIVERT) 25 MG tablet 1/2-1 pill up to 3 times daily for motion sickness/dizziness   meloxicam (MOBIC) 15 MG tablet Take 1 tablet (15 mg total) by mouth daily. (Patient taking differently: Take 15 mg by mouth as needed.)   Mouthwash Compounding Base LIQD Take 5 mLs by mouth every 2 (two) hours as needed.   ondansetron (ZOFRAN) 4 MG tablet Take 1 tablet (4 mg total) by mouth daily as needed for nausea or vomiting.   potassium chloride SA (KLOR-CON) 20 MEQ tablet TAKE 1 TABLET BY MOUTH TWICE DAILY FOR POTASSIUM   pravastatin (PRAVACHOL) 40 MG tablet Take  1 tablet  at Bedtime  for Cholesterol   sertraline (ZOLOFT) 100 MG tablet TAKE 1 TABLET BY MOUTH DAILY FOR MOOD WITH FOOD   vitamin B-12 (CYANOCOBALAMIN) 250 MCG tablet Take 250 mcg by mouth daily.   No current facility-administered medications on file prior to visit.   Immunization History   Administered Date(s) Administered   Influenza Split 06/04/2014, 06/05/2015   Influenza, High Dose Seasonal PF 05/30/2017, 05/22/2018, 05/21/2019, 05/26/2021   Influenza-Unspecified 06/06/2013, 06/13/2016   Pneumococcal Conjugate-13 06/05/2015   Pneumococcal Polysaccharide-23 04/06/2014   Pneumococcal-Unspecified 09/04/1996, 09/04/2016   Td 09/04/2005   Tdap 03/22/2017     ROS: all negative except above.   Physical Exam:  BP 114/60   Pulse 67   Temp (!) 97.5 F (36.4 C)   Wt 119 lb 12.8 oz (54.3 kg)   SpO2 99%   BMI 21.22 kg/m   General Appearance: Well nourished, in no apparent distress. Eyes: PERRLA, EOMs, conjunctiva no swelling or erythema Sinuses: No Frontal/maxillary tenderness ENT/Mouth: Ext aud canals clear, TMs without erythema, bulging. No erythema, swelling, or exudate on post pharynx.  Tonsils not swollen or erythematous. Hearing normal.  Neck: Supple, thyroid normal.  Respiratory: Respiratory effort normal, BS equal bilaterally without rales, rhonchi, wheezing or stridor.  Cardio: RRR with no MRGs. Brisk peripheral pulses   Abdomen: Soft, + BS.  Non tender, no guarding, rebound, hernias, masses. Lymphatics: Non tender without lymphadenopathy.  Musculoskeletal: Full ROM, 5/5 strength, normal gait.  Skin: Warm, dry . Right forearm has 5 scabbed areas, redness running from elbow down to fingers on right hand. 1+  nonpitting edema of right hand Neuro: Cranial nerves intact. Normal muscle tone, no cerebellar symptoms. Sensation intact.  Psych: Awake and oriented X 3, normal affect, Insight and Judgment appropriate.     [name], NP 10:26 AM [facility]

## 2021-07-19 ENCOUNTER — Other Ambulatory Visit: Payer: Self-pay

## 2021-07-19 ENCOUNTER — Encounter: Payer: Self-pay | Admitting: Nurse Practitioner

## 2021-07-19 ENCOUNTER — Ambulatory Visit (INDEPENDENT_AMBULATORY_CARE_PROVIDER_SITE_OTHER): Payer: Medicare Other | Admitting: Nurse Practitioner

## 2021-07-19 VITALS — BP 114/60 | HR 67 | Temp 97.5°F | Wt 119.8 lb

## 2021-07-19 DIAGNOSIS — L03113 Cellulitis of right upper limb: Secondary | ICD-10-CM

## 2021-07-19 MED ORDER — DOXYCYCLINE MONOHYDRATE 100 MG PO TABS: 100.0000 mg | ORAL_TABLET | Freq: Two times a day (BID) | ORAL | 0 refills | Status: AC

## 2021-07-19 NOTE — Patient Instructions (Signed)

## 2021-08-15 ENCOUNTER — Other Ambulatory Visit: Payer: Self-pay | Admitting: Internal Medicine

## 2021-08-15 DIAGNOSIS — Z1231 Encounter for screening mammogram for malignant neoplasm of breast: Secondary | ICD-10-CM

## 2021-08-22 ENCOUNTER — Telehealth: Payer: Self-pay

## 2021-08-22 ENCOUNTER — Other Ambulatory Visit: Payer: Self-pay | Admitting: Nurse Practitioner

## 2021-08-22 DIAGNOSIS — R600 Localized edema: Secondary | ICD-10-CM

## 2021-08-22 MED ORDER — FUROSEMIDE 20 MG PO TABS: 20.0000 mg | ORAL_TABLET | Freq: Every day | ORAL | 1 refills | Status: DC | PRN

## 2021-08-22 MED ORDER — MOUTHWASH COMPOUNDING BASE PO LIQD: 5.0000 mL | ORAL | 1 refills | Status: DC | PRN

## 2021-08-22 NOTE — Telephone Encounter (Signed)
States that she was taking OTC cold medicine and now her tongue is sore. Requesting a prescription for Miracle Mouthwash, was told she needed to be seen. Doesn't want to come in stating that we prescribed it for her last November.

## 2021-08-22 NOTE — Telephone Encounter (Signed)
Patient states that her ankles are swelling and can't find her Lasix. Requesting a prescription for this.

## 2021-09-06 ENCOUNTER — Encounter: Payer: Self-pay | Admitting: Internal Medicine

## 2021-09-06 ENCOUNTER — Ambulatory Visit (INDEPENDENT_AMBULATORY_CARE_PROVIDER_SITE_OTHER): Payer: Medicare Other | Admitting: Internal Medicine

## 2021-09-06 ENCOUNTER — Other Ambulatory Visit: Payer: Self-pay

## 2021-09-06 VITALS — BP 140/78 | HR 65 | Temp 97.9°F | Resp 16 | Ht 63.0 in | Wt 122.8 lb

## 2021-09-06 DIAGNOSIS — N390 Urinary tract infection, site not specified: Secondary | ICD-10-CM | POA: Diagnosis not present

## 2021-09-06 NOTE — Patient Instructions (Signed)
Urinary Tract Infection, Adult A urinary tract infection (UTI) is an infection of any part of the urinary tract. The urinary tract includes the kidneys, ureters, bladder, and urethra. These organs make, store, and get rid of urine in the body. An upper UTI affects the ureters and kidneys. A lower UTI affects the bladder and urethra. What are the causes? Most urinary tract infections are caused by bacteria in your genital area around your urethra, where urine leaves your body. These bacteria grow and cause inflammation of your urinary tract. What increases the risk? You are more likely to develop this condition if: You have a urinary catheter that stays in place. You are not able to control when you urinate or have a bowel movement (incontinence). You are female and you: Use a spermicide or diaphragm for birth control. Have low estrogen levels. Are pregnant. You have certain genes that increase your risk. You are sexually active. You take antibiotic medicines. You have a condition that causes your flow of urine to slow down, such as: An enlarged prostate, if you are female. Blockage in your urethra. A kidney stone. A nerve condition that affects your bladder control (neurogenic bladder). Not getting enough to drink, or not urinating often. You have certain medical conditions, such as: Diabetes. A weak disease-fighting system (immunesystem). Sickle cell disease. Gout. Spinal cord injury. What are the signs or symptoms? Symptoms of this condition include: Needing to urinate right away (urgency). Frequent urination. This may include small amounts of urine each time you urinate. Pain or burning with urination. Blood in the urine. Urine that smells bad or unusual. Trouble urinating. Cloudy urine. Vaginal discharge, if you are female. Pain in the abdomen or the lower back. You may also have: Vomiting or a decreased appetite. Confusion. Irritability or tiredness. A fever or  chills. Diarrhea. The first symptom in older adults may be confusion. In some cases, they may not have any symptoms until the infection has worsened. How is this diagnosed? This condition is diagnosed based on your medical history and a physical exam. You may also have other tests, including: Urine tests. Blood tests. Tests for STIs (sexually transmitted infections). If you have had more than one UTI, a cystoscopy or imaging studies may be done to determine the cause of the infections. How is this treated? Treatment for this condition includes: Antibiotic medicine. Over-the-counter medicines to treat discomfort. Drinking enough water to stay hydrated. If you have frequent infections or have other conditions such as a kidney stone, you may need to see a health care provider who specializes in the urinary tract (urologist). In rare cases, urinary tract infections can cause sepsis. Sepsis is a life-threatening condition that occurs when the body responds to an infection. Sepsis is treated in the hospital with IV antibiotics, fluids, and other medicines. Follow these instructions at home: Medicines Take over-the-counter and prescription medicines only as told by your health care provider. If you were prescribed an antibiotic medicine, take it as told by your health care provider. Do not stop using the antibiotic even if you start to feel better. General instructions Make sure you: Empty your bladder often and completely. Do not hold urine for long periods of time. Empty your bladder after sex. Wipe from front to back after urinating or having a bowel movement if you are female. Use each tissue only one time when you wipe. Drink enough fluid to keep your urine pale yellow. Keep all follow-up visits. This is important. Contact a health care provider   if: Your symptoms do not get better after 1-2 days. Your symptoms go away and then return. Get help right away if: You have severe pain in your  back or your lower abdomen. You have a fever or chills. You have nausea or vomiting. Summary A urinary tract infection (UTI) is an infection of any part of the urinary tract, which includes the kidneys, ureters, bladder, and urethra. Most urinary tract infections are caused by bacteria in your genital area. Treatment for this condition often includes antibiotic medicines. If you were prescribed an antibiotic medicine, take it as told by your health care provider. Do not stop using the antibiotic even if you start to feel better. Keep all follow-up visits. This is important. This information is not intended to replace advice given to you by your health care provider. Make sure you discuss any questions you have with your health care provider. Document Revised: 04/02/2020 Document Reviewed: 04/02/2020 Elsevier Patient Education  2022 [employer]  

## 2021-09-06 NOTE — Progress Notes (Addendum)
Future Appointments  Date Time Provider [place]  09/20/2021  9:20 AM [facility]  11/08/2021 10:00 AM [name], NP [facility] None  05/19/2022  2:40 PM [name], MD [facility] None  05/30/2022 11:00 AM [name], NP [facility] None    History of Present Illness:          Patient is a very nice 71 yo recently [facility] who presents with c/o dark yellow urine. Having ? mild  dysuria.   Medications  Current Outpatient Medications (Endocrine & Metabolic):    alendronate (FOSAMAX) 70 MG tablet, Take 1 tablet (70 mg total) by mouth once a week. Take on empty stomach with full glass of water and avoid reclining for 30 min after taking med. (Patient not taking: Reported on 09/06/2021)  Current Outpatient Medications (Cardiovascular):    diltiazem (CARDIZEM) 120 MG tablet, Take  1 tablet  2 x /day (every 12 hours)  for BP   furosemide (LASIX) 20 MG tablet, Take 1 tablet (20 mg total) by mouth daily as needed for edema.   pravastatin (PRAVACHOL) 40 MG tablet, Take  1 tablet  at Bedtime  for Cholesterol   Current Outpatient Medications (Analgesics):    aspirin 81 MG tablet, Take 81 mg by mouth at bedtime.    meloxicam (MOBIC) 15 MG tablet, Take 1 tablet (15 mg total) by mouth daily. (Patient not taking: Reported on 09/06/2021)  Current Outpatient Medications (Hematological):    vitamin B-12 (CYANOCOBALAMIN) 250 MCG tablet, Take 250 mcg by mouth daily.  Current Outpatient Medications (Other):    ALPRAZolam (XANAX) 0.5 MG tablet, Take     1/2  to 1 tablet        2 to 3 x /day         as needed for Nerves   Ascorbic Acid (VITAMIN C) 1000 MG tablet, Take 1,000 mg by mouth daily.   cholecalciferol (VITAMIN D) 1000 units tablet, Take 10,000 Units by mouth daily.   famotidine (PEPCID) 20 MG tablet, 20 mg daily. As needed.   gabapentin (NEURONTIN) 300 MG capsule, Take 1 capsule  3 x /day  for Pain   Mouthwash Compounding Base LIQD, Take 5 mLs by mouth  every 2 (two) hours as needed.   potassium chloride SA (KLOR-CON) 20 MEQ tablet, TAKE 1 TABLET BY MOUTH TWICE DAILY FOR POTASSIUM   sertraline (ZOLOFT) 100 MG tablet, TAKE 1 TABLET BY MOUTH DAILY FOR MOOD WITH FOOD   meclizine (ANTIVERT) 25 MG tablet, 1/2-1 pill up to 3 times daily for motion sickness/dizziness (Patient not taking: Reported on 09/06/2021)   ondansetron (ZOFRAN) 4 MG tablet, Take 1 tablet (4 mg total) by mouth daily as needed for nausea or vomiting. (Patient not taking: Reported on 09/06/2021)  Problem list She has Esophageal reflux; Generalized anxiety disorder; Chronic cough; Essential hypertension; Hyperlipidemia, mixed; Abnormal glucose; Vitamin D deficiency; Medication management; Aortic atherosclerosis ([place]) by Abd CT scan 02/24/2015; CKD (chronic kidney disease) stage 3, GFR 30-59 ml/min ([place]); Hiatal hernia; Chronic rhinitis; Laryngopharyngeal reflux (LPR); Multiple lentigines syndrome; FHx: heart disease; Osteoporosis; Recurrent acute serous otitis media of right ear; History of eustachian tube dysfunction; TB lung, latent; Tenderness of both temporomandibular joints; Multiple thyroid nodules; and H/O thyroid disease on their problem list.   Observations/Objective:  BP 140/78    Pulse 65    Temp 97.9 F (36.6 C)    Resp 16    Ht 5\' 3"  (1.6 m)    Wt 122  lb 12.8 oz (55.7 kg)    SpO2 98%    BMI 21.75 kg/m   HEENT - WNL. Neck - supple.  Chest - Clear equal BS. Cor - Nl HS. RRR w/o sig MGR. PP 1(+). No edema. MS- FROM w/o deformities.  Gait Nl. Neuro -  Nl w/o focal abnormalities.   Assessment and Plan:   1. Urinary tract infection without hematuria,   - Urinalysis, Routine w reflex microscopic - Urine Culture   Follow Up Instructions:        I discussed the assessment and treatment plan with the patient. The patient was provided an opportunity to ask questions and all were answered. The patient agreed with the plan and demonstrated an understanding of the  instructions.       The patient was advised to call back or seek an in-person evaluation if the symptoms worsen or if the condition fails to improve as anticipated.    Kirtland Bouchard, MD

## 2021-09-07 LAB — URINALYSIS, ROUTINE W REFLEX MICROSCOPIC
Bacteria, UA: NONE SEEN /HPF
Bilirubin Urine: NEGATIVE
Glucose, UA: NEGATIVE
Hgb urine dipstick: NEGATIVE
Hyaline Cast: NONE SEEN /LPF
Ketones, ur: NEGATIVE
Nitrite: NEGATIVE
Protein, ur: NEGATIVE
RBC / HPF: NONE SEEN /HPF (ref 0–2)
Specific Gravity, Urine: 1.019 (ref 1.001–1.035)
Squamous Epithelial / HPF: NONE SEEN /HPF (ref <=5)
pH: 6.5 (ref 5.0–8.0)

## 2021-09-07 LAB — URINE CULTURE
MICRO NUMBER:: 12820912
Result:: NO GROWTH
SPECIMEN QUALITY:: ADEQUATE

## 2021-09-07 LAB — MICROSCOPIC MESSAGE

## 2021-09-07 NOTE — Progress Notes (Signed)
============================================================ °============================================================ ° °-    U/C - >  is "OK"  - No infection & No Abx needed   ============================================================ ============================================================

## 2021-09-09 NOTE — Progress Notes (Signed)
Lvm for pt to call back for results

## 2021-09-09 NOTE — Progress Notes (Signed)
Spoke to pt, she is aware of results and recommendations. Did not have any questions for provider or nurse

## 2021-09-20 ENCOUNTER — Ambulatory Visit: Payer: Medicare Other

## 2021-09-22 DIAGNOSIS — M25561 Pain in right knee: Secondary | ICD-10-CM | POA: Diagnosis not present

## 2021-10-19 DIAGNOSIS — H6983 Other specified disorders of Eustachian tube, bilateral: Secondary | ICD-10-CM | POA: Diagnosis not present

## 2021-10-19 DIAGNOSIS — R0981 Nasal congestion: Secondary | ICD-10-CM | POA: Diagnosis not present

## 2021-10-27 DIAGNOSIS — M25561 Pain in right knee: Secondary | ICD-10-CM | POA: Diagnosis not present

## 2021-10-27 DIAGNOSIS — M25511 Pain in right shoulder: Secondary | ICD-10-CM | POA: Diagnosis not present

## 2021-11-04 NOTE — Progress Notes (Unsigned)
CPE and follow up  Assessment and Plan:   Encounter for Annual Physical Exam with abnormal findings Due annually  Health Maintenance reviewed Healthy lifestyle reviewed and goals set  - requested covid 19 vaccine record  Essential hypertension - continue medications, DASH diet, exercise and monitor at home. Call if greater than 130/80.  -     CBC with Differential/Platelet -     CMP/GFR -     TSH - defer, just had  Mixed hyperlipidemia -continue medications, check lipids, decrease fatty foods, increase activity.  -     Lipid panel  Aortic atherosclerosis ([place]) - per CT 02/2015 Control blood pressure, cholesterol, glucose, increase exercise.   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 Cruzita Lederer is now following annually  -Recommended 5 years of annual thyroid US  CKD (chronic kidney disease) stage 3, GFR 30-59 ml/min (HCC) Increase fluids, avoid NSAIDS, monitor sugars, will monitor  Other abnormal glucose Discussed general issues about diabetes pathophysiology and management., Educational material distributed., Suggested low cholesterol diet., Encouraged aerobic exercise., Discussed foot care., Reminded to get yearly retinal exam. - A1C annually   Hiatal hernia Continue GERD medication, follow up GI as needed Encouraged small meals, avoid fatty meals, avoid lying down after meals   Chronic rhinitis Allergy pill daily, nasal sprays, increase H20, allergy hygiene explained.  Esophageal reflux/LPR Continue H2 blocker, lifestyle discussed  Generalized anxiety disorder Continue medications; doing well limiting benzo *** stress management techniques discussed, increase water, good sleep hygiene discussed, increase exercise, and increase veggies.   Vitamin D deficiency continue to recommend supplementation for goal of 50-100 Check vitamin D level annually and as needed  Chronic cough syndrome Continue GERD meds, follow up Dr.  Melvyn Novas as needed.   Osteoporosis DEXA due 06/2021, - has scheduled *** Has been declining alendronate, would consider pending results, otherwise will defer to Dr. Cruzita Lederer for consideration of prolia Discussed Vit D with K2 supplement and Calcium rich diet, weight bearing exercises  CKD IIIa (HCC) Increase fluids, avoid NSAIDS, monitor sugars, will monitor - CMP/GFR, UA, microalbumin     No orders of the defined types were placed in this encounter.     Over 30 minutes of face to face exam, counseling, chart review and critical decision making was performed Future Appointments  Date Time Provider [place]  11/08/2021 10:00 AM [name], NP [facility] None  05/19/2022  2:40 PM [name], MD [facility] None  05/30/2022 11:00 AM [name], NP [facility] None    Plan:   During the course of the visit the patient was educated and counseled about appropriate screening and preventive services including:   Pneumococcal vaccine  Prevnar 13 Influenza vaccine Td vaccine Screening electrocardiogram Bone densitometry screening Colorectal cancer screening Diabetes screening Glaucoma screening Nutrition counseling  Advanced directives: requested    Subjective:  Latoya [name] is a 71 y.o. female who presents for CPE and follow up. She has Esophageal reflux; Generalized anxiety disorder; Chronic cough; Essential hypertension; Hyperlipidemia, mixed; Abnormal glucose; Vitamin D deficiency; Medication management; Aortic atherosclerosis ([place]) by Abd CT scan 02/24/2015; CKD (chronic kidney disease) stage 3, GFR 30-59 ml/min ([place]); Hiatal hernia; Chronic rhinitis; Laryngopharyngeal reflux (LPR); Multiple lentigines syndrome; FHx: heart disease; Osteoporosis; Recurrent acute serous otitis media of right ear; History of eustachian tube dysfunction; TB lung, latent; Tenderness of both temporomandibular joints; Multiple thyroid nodules; and H/O thyroid disease on their problem  list.    Husband passed in 2021 with late stage  lung disease, has 2 grown daughters, some family strife.   She  has a diagnosis of depression/anxiety and is currently on zoloft 100 mg daily, xanax 0.5 mg PRN, use varies, avoids taking daily.  She feels doing better recently.   She has GERD/LPR, had EGD 10/2017, on pepcid 40 mg daily per Dr. Carlean Purl; she also has dx of chronic cough syndrome per Dr. Melvyn Novas after extensive workup for persistent cough. She was notably evaluated by [facility]NT Dr. Pietro Cassis 05/12/2020 due to recurrent serous otitis and ear complaints; he diagnosed with eustachian tube dysfunction. He recommended she utilize Flonase and astalin nasal sprays PRN. She admits hasn't been using allergy meds, but does have at home to start if sx are bothersome.   BMI is There is no height or weight on file to calculate BMI., she is very active around yard but admits appetite hasn't been good, forgets to eat. Knows she needs to do better.  Wt Readings from Last 3 Encounters:  09/06/21 122 lb 12.8 oz (55.7 kg)  07/19/21 119 lb 12.8 oz (54.3 kg)  07/12/21 119 lb 12.8 oz (54.3 kg)   Her blood pressure has been controlled at home, today their BP is    She does workout, walking, yard work, house work. She denies chest pain, shortness of breath, dizziness.  She has aortic atherosclerosis per CT 02/2015.    She is on cholesterol medication (pravastatin 40 mg daily) and denies myalgias. Her cholesterol is at LDL goal. The cholesterol last visit was:   Lab Results  Component Value Date   CHOL 178 05/26/2021   HDL 65 05/26/2021   LDLCALC 94 05/26/2021   TRIG 92 05/26/2021   CHOLHDL 2.7 05/26/2021    She has been working on diet and exercise for glucose management, and denies paresthesia of the feet, polydipsia, polyuria and visual disturbances. Last A1C in the office was:  Lab Results  Component Value Date   HGBA1C 5.3 11/08/2020   Has had CKD IIIa range predating 2015 per lab review,  intermittently improves with pushed fluids, generally admits to poor water intake. Last GFR: Lab Results  Component Value Date   EGFR 59 (L) 05/26/2021   Patient is on Vitamin D supplement for deficency.   Lab Results  Component Value Date   VD25OH 44 05/26/2021     She has multinodular thyroid, last [place] 11/2020, now following with Dr. Cruzita Lederer, planning annual [place] follow up~ 5 years.  Recent TSHs have been normal.  Lab Results  Component Value Date   TSH 2.22 05/13/2021     Medication Review: Current Outpatient Medications on File Prior to Visit  Medication Sig   alendronate (FOSAMAX) 70 MG tablet Take 1 tablet (70 mg total) by mouth once a week. Take on empty stomach with full glass of water and avoid reclining for 30 min after taking med. (Patient not taking: Reported on 09/06/2021)   ALPRAZolam (XANAX) 0.5 MG tablet Take     1/2  to 1 tablet        2 to 3 x /day         as needed for Nerves   Ascorbic Acid (VITAMIN C) 1000 MG tablet Take 1,000 mg by mouth daily.   aspirin 81 MG tablet Take 81 mg by mouth at bedtime.    cholecalciferol (VITAMIN D) 1000 units tablet Take 10,000 Units by mouth daily.   diltiazem (CARDIZEM) 120 MG tablet Take  1 tablet  2 x /day (every 12 hours)  for BP   famotidine (PEPCID) 20 MG tablet 20 mg daily. As needed.   furosemide (LASIX) 20 MG tablet Take 1 tablet (20 mg total) by mouth daily as needed for edema.   gabapentin (NEURONTIN) 300 MG capsule Take 1 capsule  3 x /day  for Pain   meclizine (ANTIVERT) 25 MG tablet 1/2-1 pill up to 3 times daily for motion sickness/dizziness (Patient not taking: Reported on 09/06/2021)   meloxicam (MOBIC) 15 MG tablet Take 1 tablet (15 mg total) by mouth daily. (Patient not taking: Reported on 09/06/2021)   Mouthwash Compounding Base LIQD Take 5 mLs by mouth every 2 (two) hours as needed.   ondansetron (ZOFRAN) 4 MG tablet Take 1 tablet (4 mg total) by mouth daily as needed for nausea or vomiting. (Patient not taking:  Reported on 09/06/2021)   potassium chloride SA (KLOR-CON) 20 MEQ tablet TAKE 1 TABLET BY MOUTH TWICE DAILY FOR POTASSIUM   pravastatin (PRAVACHOL) 40 MG tablet Take  1 tablet  at Bedtime  for Cholesterol   sertraline (ZOLOFT) 100 MG tablet TAKE 1 TABLET BY MOUTH DAILY FOR MOOD WITH FOOD   vitamin B-12 (CYANOCOBALAMIN) 250 MCG tablet Take 250 mcg by mouth daily.   No current facility-administered medications on file prior to visit.     Allergies  Allergen Reactions   Other     All pain medications: Unknown/ CODEINE   Prednisone Other (See Comments)    DYSPHORIA   Vioxx [Rofecoxib]     unknown   Entex T [Pseudoephedrine-Guaifenesin] Palpitations   Levaquin [Levofloxacin] Rash    Tolerated Avelox without adverse effect 11/08/15.   Sulfa Antibiotics Rash   Trovan [Alatrofloxacin] Rash    Current Problems (verified) Patient Active Problem List   Diagnosis Date Noted   Multiple thyroid nodules 05/13/2021   H/O thyroid disease 05/13/2021   Tenderness of both temporomandibular joints 10/13/2020   TB lung, latent 06/21/2020   Recurrent acute serous otitis media of right ear 05/19/2020   History of eustachian tube dysfunction 05/19/2020   Osteoporosis 06/23/2019   FHx: heart disease 11/27/2017   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   Laryngopharyngeal reflux (LPR) 10/12/2016   Chronic rhinitis 10/09/2016   Hiatal hernia 07/14/2016   Aortic atherosclerosis ([place]) by Abd CT scan 02/24/2015 06/30/2016   CKD (chronic kidney disease) stage 3, GFR 30-59 ml/min (HCC) 06/30/2016   Abnormal glucose 05/01/2014   Vitamin D deficiency 05/01/2014   Medication management 05/01/2014   Essential hypertension 11/26/2013   Hyperlipidemia, mixed 11/26/2013   Chronic cough 09/19/2013   Generalized anxiety disorder 07/11/2013   Esophageal reflux 11/29/2012    Health Maintenance:   Immunization History  Administered Date(s) Administered   Influenza Split 06/04/2014, 06/05/2015   Influenza,  High Dose Seasonal PF 05/30/2017, 05/22/2018, 05/21/2019, 05/26/2021   Influenza-Unspecified 06/06/2013, 06/13/2016   Pneumococcal Conjugate-13 06/05/2015   Pneumococcal Polysaccharide-23 04/06/2014   Pneumococcal-Unspecified 09/04/1996, 09/04/2016   Td 09/04/2005   Tdap 03/22/2017   Health Maintenance  Topic Date Due   COVID-19 Vaccine (1) Never done   Zoster Vaccines- Shingrix (1 of 2) Never done   Pneumonia Vaccine 57+ Years old (71) 04/07/2019   MAMMOGRAM  06/23/2022   COLONOSCOPY (Pts 45-51yr Insurance coverage will need to be confirmed)  12/13/2022   TETANUS/TDAP  03/23/2027   INFLUENZA VACCINE  Completed   DEXA SCAN  Completed   Hepatitis C Screening  Completed   HPV VACCINES  Aged Out   Last colonoscopy: 12/2012 GCarlean Purl 10 year follow up EGD  10/2017  Last mammogram: breast center DEXA: 06/2019 osteoporosis, T -2.7, fosamax was prescribed but never started; DEXA 06/2021 R fem T -2.9, fosamax ***   US thyroid 11/19/2020, multinodular thyroid, Dr. Cruzita Lederer now following with annual [place]  Shingles/Zostavax: check with insurance   Names of Other Physician/Practitioners you currently use: 1. [facility] here for primary care 2. [facility], eye doctor, last visit 2021, has schedulee 12/2020 3. Ladell Pier, dentist, last visit 2020, Due, encouraged to schedule   Patient Care Team: Unk Pinto, MD as PCP - General (Internal Medicine) Aquilla Hacker, MD as Referring Physician (Psychiatry) Jannette Spanner, MD as Referring Physician (Dermatology)  SURGICAL HISTORY She  has a past surgical history that includes Abdominal hysterectomy; Tubal ligation; Foot surgery (Left, 2008); Colonoscopy; Upper gastrointestinal endoscopy; Oophorectomy (Right); Carpometacarpel [facility]) suspension plasty (Right, 12/08/2013); I & D extremity (Right, 01/20/2017); Hand surgery (Right); and Cataract extraction (Right, 2020). FAMILY HISTORY Her family history  includes Asthma in her mother; CVA in her brother; Dementia in her brother; Heart Problems in her maternal grandfather; Heart attack in her brother; Heart disease in her brother and father; Other in her brother; Pancreatic cancer in her brother; Tuberculosis in her daughter. SOCIAL HISTORY She  reports that she has never smoked. She has been exposed to tobacco smoke. She has never used smokeless tobacco. She reports that she does not drink alcohol and does not use drugs.  Depression/mood screen:   Depression screen [facility] 2/9 05/26/2021  Decreased Interest 0  Down, Depressed, Hopeless 1  PHQ - 2 Score 1  Altered sleeping -  Tired, decreased energy -  Change in appetite -  Feeling bad or failure about yourself  -  Trouble concentrating -  Moving slowly or fidgety/restless -  Suicidal thoughts -  PHQ-9 Score -  Difficult doing work/chores -  Some recent data might be hidden      Review of Systems  Constitutional:  Negative for malaise/fatigue and weight loss.  HENT:  Positive for hearing loss (R, has number to schedule for hearing aids). Negative for tinnitus.   Eyes:  Negative for blurred vision and double vision.  Respiratory:  Negative for cough, shortness of breath and wheezing.   Cardiovascular:  Negative for chest pain, palpitations, orthopnea, claudication and leg swelling.  Gastrointestinal:  Negative for abdominal pain, blood in stool, constipation, diarrhea, heartburn, melena, nausea and vomiting.  Genitourinary: Negative.   Musculoskeletal:  Negative for joint pain and myalgias.  Skin:  Negative for rash.  Neurological:  Negative for dizziness, tingling, sensory change, weakness and headaches.  Endo/Heme/Allergies:  Positive for environmental allergies (mild). Negative for polydipsia.  Psychiatric/Behavioral: Negative.  Negative for depression (improved with meds), memory loss, substance abuse and suicidal ideas. The patient is not nervous/anxious and does not have insomnia.    All other systems reviewed and are negative.  Objective:     There were no vitals filed for this visit.  There is no height or weight on file to calculate BMI.  General appearance: alert, no distress, WD/WN, female HEENT: normocephalic, sclerae anicteric, TMs pearly, nares patent, no discharge or erythema, pharynx normal Oral cavity: MMM, no lesions Neck: supple, no lymphadenopathy, no thyromegaly, no masses Heart: RRR, normal S1, S2, no murmurs Lungs: CTA bilaterally, no wheezes, rhonchi, or rales Abdomen: +bs, soft, non-tender, non distended, no masses, no hepatomegaly,  Splenomegaly noted.   Musculoskeletal: Full ROM all extremities, nontender, no swelling, no obvious deformity. Non-antalgic gait.  Extremities: no edema,  no cyanosis, no clubbing Pulses: 2+ symmetric, upper and lower extremities, normal cap refill Neurological: alert, oriented x 3, CN2-12 intact, strength normal upper extremities and lower extremities, sensation normal throughout, DTRs 2+  no cerebellar signs. Psychiatric: Normal affect, behavior normal, pleasant  Breasts: declines *** GU: no concerns, defer  EKG: ***  Izora Ribas, NP 1:06 PM [facility]

## 2021-11-08 ENCOUNTER — Encounter: Payer: Medicare Other | Admitting: Adult Health

## 2021-11-08 DIAGNOSIS — N1831 Chronic kidney disease, stage 3a: Secondary | ICD-10-CM

## 2021-11-08 DIAGNOSIS — F411 Generalized anxiety disorder: Secondary | ICD-10-CM

## 2021-11-08 DIAGNOSIS — Z131 Encounter for screening for diabetes mellitus: Secondary | ICD-10-CM

## 2021-11-08 DIAGNOSIS — Z8249 Family history of ischemic heart disease and other diseases of the circulatory system: Secondary | ICD-10-CM

## 2021-11-08 DIAGNOSIS — M810 Age-related osteoporosis without current pathological fracture: Secondary | ICD-10-CM

## 2021-11-08 DIAGNOSIS — R7309 Other abnormal glucose: Secondary | ICD-10-CM

## 2021-11-08 DIAGNOSIS — I700 Atherosclerosis of aorta: Secondary | ICD-10-CM

## 2021-11-08 DIAGNOSIS — Z8639 Personal history of other endocrine, nutritional and metabolic disease: Secondary | ICD-10-CM

## 2021-11-08 DIAGNOSIS — Z6821 Body mass index (BMI) 21.0-21.9, adult: Secondary | ICD-10-CM

## 2021-11-08 DIAGNOSIS — Z79899 Other long term (current) drug therapy: Secondary | ICD-10-CM

## 2021-11-08 DIAGNOSIS — I10 Essential (primary) hypertension: Secondary | ICD-10-CM

## 2021-11-08 DIAGNOSIS — E782 Mixed hyperlipidemia: Secondary | ICD-10-CM

## 2021-11-08 DIAGNOSIS — E559 Vitamin D deficiency, unspecified: Secondary | ICD-10-CM

## 2021-11-08 DIAGNOSIS — Z0001 Encounter for general adult medical examination with abnormal findings: Secondary | ICD-10-CM

## 2021-11-09 ENCOUNTER — Ambulatory Visit (INDEPENDENT_AMBULATORY_CARE_PROVIDER_SITE_OTHER): Payer: Medicare Other | Admitting: Adult Health

## 2021-11-09 ENCOUNTER — Other Ambulatory Visit: Payer: Self-pay

## 2021-11-09 ENCOUNTER — Encounter: Payer: Self-pay | Admitting: Adult Health

## 2021-11-09 VITALS — BP 138/84 | HR 90 | Temp 97.5°F | Ht 63.0 in | Wt 121.0 lb

## 2021-11-09 DIAGNOSIS — Z0001 Encounter for general adult medical examination with abnormal findings: Secondary | ICD-10-CM

## 2021-11-09 DIAGNOSIS — I10 Essential (primary) hypertension: Secondary | ICD-10-CM | POA: Diagnosis not present

## 2021-11-09 DIAGNOSIS — E782 Mixed hyperlipidemia: Secondary | ICD-10-CM | POA: Diagnosis not present

## 2021-11-09 DIAGNOSIS — Z0000 Encounter for general adult medical examination without abnormal findings: Secondary | ICD-10-CM

## 2021-11-09 DIAGNOSIS — E559 Vitamin D deficiency, unspecified: Secondary | ICD-10-CM | POA: Diagnosis not present

## 2021-11-09 DIAGNOSIS — Z136 Encounter for screening for cardiovascular disorders: Secondary | ICD-10-CM

## 2021-11-09 DIAGNOSIS — R7309 Other abnormal glucose: Secondary | ICD-10-CM | POA: Diagnosis not present

## 2021-11-09 DIAGNOSIS — Z8249 Family history of ischemic heart disease and other diseases of the circulatory system: Secondary | ICD-10-CM | POA: Diagnosis not present

## 2021-11-09 DIAGNOSIS — Z79899 Other long term (current) drug therapy: Secondary | ICD-10-CM

## 2021-11-09 DIAGNOSIS — M810 Age-related osteoporosis without current pathological fracture: Secondary | ICD-10-CM

## 2021-11-09 DIAGNOSIS — I700 Atherosclerosis of aorta: Secondary | ICD-10-CM

## 2021-11-09 DIAGNOSIS — Z131 Encounter for screening for diabetes mellitus: Secondary | ICD-10-CM

## 2021-11-09 DIAGNOSIS — L814 Other melanin hyperpigmentation: Secondary | ICD-10-CM

## 2021-11-09 DIAGNOSIS — E042 Nontoxic multinodular goiter: Secondary | ICD-10-CM

## 2021-11-09 DIAGNOSIS — F411 Generalized anxiety disorder: Secondary | ICD-10-CM

## 2021-11-09 DIAGNOSIS — Z1329 Encounter for screening for other suspected endocrine disorder: Secondary | ICD-10-CM

## 2021-11-09 DIAGNOSIS — N1831 Chronic kidney disease, stage 3a: Secondary | ICD-10-CM | POA: Diagnosis not present

## 2021-11-09 MED ORDER — MOUTHWASH COMPOUNDING BASE PO LIQD: 5.0000 mL | ORAL | 1 refills | Status: DC | PRN

## 2021-11-09 NOTE — Progress Notes (Signed)
CPE and follow up  Assessment and Plan:   Encounter for Annual Physical Exam with abnormal findings Due annually  Health Maintenance reviewed Healthy lifestyle reviewed and goals set  - requested covid 19 vaccine record - schedule overdue mammogram - order in system - increase water intake - check about shingrix coverage  Essential hypertension - continue medications, DASH diet, exercise and monitor at home. Call if greater than 130/80.  -     CBC with Differential/Platelet -     CMP/GFR -     TSH - defer, just had  Mixed hyperlipidemia -continue medications, check lipids, decrease fatty foods, increase activity.  -     Lipid panel  Aortic atherosclerosis ([place]) - per CT 02/2015 Control blood pressure, cholesterol, glucose, increase exercise.   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 Cruzita Lederer is now following annually  -Recommended 5 years of annual thyroid US  CKD (chronic kidney disease) stage 3, GFR 30-59 ml/min (HCC) Increase fluids, avoid NSAIDS, monitor sugars, will monitor  Other abnormal glucose Discussed general issues about diabetes pathophysiology and management., Educational material distributed., Suggested low cholesterol diet., Encouraged aerobic exercise., Discussed foot care., Reminded to get yearly retinal exam. - A1C annually   Hiatal hernia Continue GERD medication, follow up GI as needed Encouraged small meals, avoid fatty meals, avoid lying down after meals   Chronic rhinitis Allergy pill daily, nasal sprays, increase H20, allergy hygiene explained.  Esophageal reflux/LPR Continue H2 blocker, lifestyle discussed  Generalized anxiety disorder Continue medications; doing well limiting benzo  stress management techniques discussed, increase water, good sleep hygiene discussed, increase exercise, and increase veggies.   Vitamin D deficiency continue to recommend supplementation for goal of  50-100 Check vitamin D level annually and as needed  Chronic cough syndrome Continue GERD meds, follow up Dr. Melvyn Novas as needed.   Osteoporosis DEXA q2y;  Forgetting alendronate - strategies discussed, set reminder or visual cues Discussed Vit D with K2 supplement and Calcium rich diet, weight bearing exercises  CKD IIIa (HCC) Increase fluids, avoid NSAIDS, monitor sugars, will monitor - CMP/GFR, UA, microalbumin     Orders Placed This Encounter  Procedures   CBC with Differential/Platelet   COMPLETE METABOLIC PANEL WITH GFR   Magnesium   Lipid panel   TSH   Hemoglobin A1c   VITAMIN D 25 Hydroxy (Vit-D Deficiency, Fractures)   Microalbumin / creatinine urine ratio   Urinalysis, Routine w reflex microscopic   EKG 12-Lead    Over 30 minutes of face to face exam, counseling, chart review and critical decision making was performed Future Appointments  Date Time Provider [place]  05/19/2022  2:40 PM [name], MD [facility] None  05/30/2022 11:00 AM [name], NP [facility] None  11/10/2022 10:00 AM [name], NP [facility] None    Plan:   During the course of the visit the patient was educated and counseled about appropriate screening and preventive services including:   Pneumococcal vaccine  Prevnar 13 Influenza vaccine Td vaccine Screening electrocardiogram Bone densitometry screening Colorectal cancer screening Diabetes screening Glaucoma screening Nutrition counseling  Advanced directives: requested    Subjective:  Latoya [name] is a 71 y.o. female who presents for CPE and follow up. She has Esophageal reflux; Generalized anxiety disorder; Chronic cough; Essential hypertension; Hyperlipidemia, mixed; Abnormal glucose; Vitamin D deficiency; Medication management; Aortic atherosclerosis ([place]) by Abd CT scan 02/24/2015; CKD (chronic kidney disease) stage 3, GFR 30-59 ml/min ([place]); Hiatal hernia; Chronic rhinitis;  Laryngopharyngeal reflux  (LPR); Multiple lentigines syndrome; FHx: heart disease; Osteoporosis; Recurrent acute serous otitis media of right ear; History of eustachian tube dysfunction; TB lung, latent; Tenderness of both temporomandibular joints; Multiple thyroid nodules; and H/O thyroid disease on their problem list.    Husband passed in 2021 with late stage lung disease, has 2 grown daughters, Arbie Cookey causes some drama. No concerns this year.   She  has a diagnosis of depression/anxiety and is currently on zoloft 100 mg daily, xanax 0.5 mg PRN, use varies, avoids taking daily.  She feels doing better recently. Last refill over 10 months ago.   She has GERD/LPR, had EGD 10/2017, on pepcid 40 mg daily per Dr. Carlean Purl; she also has dx of chronic cough syndrome per Dr. Melvyn Novas after extensive workup for persistent cough. She was notably evaluated by [facility]NT Dr. Pietro Cassis 05/12/2020 due to recurrent serous otitis and ear complaints; he diagnosed with eustachian tube dysfunction. He recommended she utilize Flonase and astalin nasal sprays PRN. She admits hasn't been using allergy meds, but does have at home to start if sx are bothersome.   BMI is Body mass index is 21.43 kg/m., she is very active around yard. She is working to eat more regularly, at least 2 meals per day. She cooks fresh, avoids processed meats, minimal red meat. She drinks unsweet tea, limits sodas but admits some days not even 1 bottle.  Wt Readings from Last 3 Encounters:  11/09/21 121 lb (54.9 kg)  09/06/21 122 lb 12.8 oz (55.7 kg)  07/19/21 119 lb 12.8 oz (54.3 kg)   Her blood pressure has been controlled at home, today their BP is BP: 138/84  She does workout, walking, yard work, house work. She denies chest pain, shortness of breath, dizziness.  She has aortic atherosclerosis per CT 02/2015.    She is on cholesterol medication (pravastatin 40 mg daily) and denies myalgias. Her cholesterol is at LDL goal. The cholesterol last visit was:   Lab Results   Component Value Date   CHOL 178 05/26/2021   HDL 65 05/26/2021   LDLCALC 94 05/26/2021   TRIG 92 05/26/2021   CHOLHDL 2.7 05/26/2021    She has been working on diet and exercise for glucose management, and denies paresthesia of the feet, polydipsia, polyuria and visual disturbances. Last A1C in the office was:  Lab Results  Component Value Date   HGBA1C 5.3 11/08/2020   Has had CKD IIIa range predating 2015 per lab review, intermittently improves with pushed fluids, generally admits to poor water intake. Last GFR: Lab Results  Component Value Date   EGFR 59 (L) 05/26/2021   Patient is on Vitamin D supplement for deficency.   Lab Results  Component Value Date   VD25OH 76 05/26/2021     She has multinodular thyroid, last [place] 11/2020, now following with Dr. Cruzita Lederer, planning annual [place] follow up~ 5 years.  Recent TSHs have been normal.  Lab Results  Component Value Date   TSH 2.22 05/13/2021     Medication Review: Current Outpatient Medications on File Prior to Visit  Medication Sig   ALPRAZolam (XANAX) 0.5 MG tablet Take     1/2  to 1 tablet        2 to 3 x /day         as needed for Nerves   Ascorbic Acid (VITAMIN C) 1000 MG tablet Take 1,000 mg by mouth daily.   aspirin 81 MG tablet Take 81 mg by  mouth at bedtime.    cholecalciferol (VITAMIN D) 1000 units tablet Take 10,000 Units by mouth daily.   diltiazem (CARDIZEM) 120 MG tablet Take  1 tablet  2 x /day (every 12 hours)  for BP   famotidine (PEPCID) 20 MG tablet 20 mg daily. As needed.   furosemide (LASIX) 20 MG tablet Take 1 tablet (20 mg total) by mouth daily as needed for edema.   potassium chloride SA (KLOR-CON) 20 MEQ tablet TAKE 1 TABLET BY MOUTH TWICE DAILY FOR POTASSIUM   pravastatin (PRAVACHOL) 40 MG tablet Take  1 tablet  at Bedtime  for Cholesterol   sertraline (ZOLOFT) 100 MG tablet TAKE 1 TABLET BY MOUTH DAILY FOR MOOD WITH FOOD   vitamin B-12 (CYANOCOBALAMIN) 250 MCG tablet Take 250 mcg by mouth daily.    alendronate (FOSAMAX) 70 MG tablet Take 1 tablet (70 mg total) by mouth once a week. Take on empty stomach with full glass of water and avoid reclining for 30 min after taking med. (Patient not taking: Reported on 09/06/2021)   meclizine (ANTIVERT) 25 MG tablet 1/2-1 pill up to 3 times daily for motion sickness/dizziness (Patient not taking: Reported on 09/06/2021)   No current facility-administered medications on file prior to visit.     Allergies  Allergen Reactions   Other     All pain medications: Unknown/ CODEINE   Prednisone Other (See Comments)    DYSPHORIA   Vioxx [Rofecoxib]     unknown   Entex T [Pseudoephedrine-Guaifenesin] Palpitations   Levaquin [Levofloxacin] Rash    Tolerated Avelox without adverse effect 11/08/15.   Sulfa Antibiotics Rash   Trovan [Alatrofloxacin] Rash    Current Problems (verified) Patient Active Problem List   Diagnosis Date Noted   Multiple thyroid nodules 05/13/2021   H/O thyroid disease 05/13/2021   Tenderness of both temporomandibular joints 10/13/2020   TB lung, latent 06/21/2020   Recurrent acute serous otitis media of right ear 05/19/2020   History of eustachian tube dysfunction 05/19/2020   Osteoporosis 06/23/2019   FHx: heart disease 11/27/2017   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   Laryngopharyngeal reflux (LPR) 10/12/2016   Chronic rhinitis 10/09/2016   Hiatal hernia 07/14/2016   Aortic atherosclerosis ([place]) by Abd CT scan 02/24/2015 06/30/2016   CKD (chronic kidney disease) stage 3, GFR 30-59 ml/min (HCC) 06/30/2016   Abnormal glucose 05/01/2014   Vitamin D deficiency 05/01/2014   Medication management 05/01/2014   Essential hypertension 11/26/2013   Hyperlipidemia, mixed 11/26/2013   Chronic cough 09/19/2013   Generalized anxiety disorder 07/11/2013   Esophageal reflux 11/29/2012    Health Maintenance:   Immunization History  Administered Date(s) Administered   Influenza Split 06/04/2014, 06/05/2015   Influenza, High  Dose Seasonal PF 05/30/2017, 05/22/2018, 05/21/2019, 05/26/2021   Influenza-Unspecified 06/06/2013, 06/13/2016   Pneumococcal Conjugate-13 06/05/2015   Pneumococcal Polysaccharide-23 04/06/2014   Pneumococcal-Unspecified 09/04/1996, 09/04/2016   Td 09/04/2005   Tdap 03/22/2017   Health Maintenance  Topic Date Due   COVID-19 Vaccine (1) Never done   Zoster Vaccines- Shingrix (1 of 2) Never done   MAMMOGRAM  06/23/2022   COLONOSCOPY (Pts 45-39yr Insurance coverage will need to be confirmed)  12/13/2022   DEXA SCAN  06/24/2023   TETANUS/TDAP  03/23/2027   INFLUENZA VACCINE  Completed   Hepatitis C Screening  Completed   HPV VACCINES  Aged Out   Pneumonia Vaccine 71 Years old  Discontinued   Last colonoscopy: 12/2012 GCarlean Purl 10 year follow up EGD 10/2017  Last mammogram: breast  center DEXA: 06/2019 osteoporosis, T -2.7, fosamax was prescribed but never started; DEXA 06/2021 R fem T -2.9, fosamax and resent fosamax    US thyroid 11/19/2020, multinodular thyroid, Dr. Cruzita Lederer now following with annual [place]  Shingles/Zostavax: check with insurance  Requested covid 19 vaccine dates  Names of Other Physician/Practitioners you currently use: 1. [facility] here for primary care 2. [facility], eye doctor, last visit 12/2020 3. [facility], dentist, last visit 2020, Due, encouraged to schedule   Patient Care Team: Unk Pinto, MD as PCP - General (Internal Medicine) Aquilla Hacker, MD as Referring Physician (Psychiatry) Jannette Spanner, MD as Referring Physician (Dermatology)  SURGICAL HISTORY She  has a past surgical history that includes Abdominal hysterectomy; Tubal ligation; Foot surgery (Left, 2008); Colonoscopy; Upper gastrointestinal endoscopy; Oophorectomy (Right); Carpometacarpel [facility]) suspension plasty (Right, 12/08/2013); I & D extremity (Right, 01/20/2017); and Hand surgery (Right). FAMILY HISTORY Her family history includes  Asthma in her mother; CVA in her brother; Dementia in her brother; Heart Problems in her maternal grandfather; Heart attack in her brother; Heart disease in her brother and father; Other in her brother; Pancreatic cancer in her brother; Tuberculosis in her daughter. SOCIAL HISTORY She  reports that she has never smoked. She has been exposed to tobacco smoke. She has never used smokeless tobacco. She reports that she does not drink alcohol and does not use drugs.  Depression/mood screen:   Depression screen [facility] 2/9 11/09/2021  Decreased Interest 0  Down, Depressed, Hopeless 0  PHQ - 2 Score 0  Altered sleeping -  Tired, decreased energy -  Change in appetite -  Feeling bad or failure about yourself  -  Trouble concentrating -  Moving slowly or fidgety/restless -  Suicidal thoughts -  PHQ-9 Score -  Difficult doing work/chores -  Some recent data might be hidden      Review of Systems  Constitutional:  Negative for malaise/fatigue and weight loss.  HENT:  Positive for hearing loss (R, has number to schedule for hearing aids). Negative for tinnitus.   Eyes:  Negative for blurred vision and double vision.  Respiratory:  Negative for cough, shortness of breath and wheezing.   Cardiovascular:  Negative for chest pain, palpitations, orthopnea, claudication and leg swelling.  Gastrointestinal:  Negative for abdominal pain, blood in stool, constipation, diarrhea, heartburn, melena, nausea and vomiting.  Genitourinary: Negative.   Musculoskeletal:  Negative for joint pain and myalgias.  Skin:  Negative for rash.  Neurological:  Negative for dizziness, tingling, sensory change, weakness and headaches.  Endo/Heme/Allergies:  Positive for environmental allergies (mild). Negative for polydipsia.  Psychiatric/Behavioral: Negative.  Negative for depression (improved with meds), memory loss, substance abuse and suicidal ideas. The patient is not nervous/anxious and does not have insomnia.   All other  systems reviewed and are negative.  Objective:     Today's Vitals   11/09/21 1009 11/09/21 1028  BP: (!) 156/92 138/84  Pulse: 90   Temp: (!) 97.5 F (36.4 C)   SpO2: 98%   Weight: 121 lb (54.9 kg)   Height: 5' 3" (1.6 m)    Body mass index is 21.43 kg/m.  General appearance: alert, no distress, WD/WN, female HEENT: normocephalic, sclerae anicteric, TMs pearly, nares patent, no discharge or erythema, pharynx normal Oral cavity: MMM, no lesions Neck: supple, no lymphadenopathy, no thyromegaly, no masses Heart: RRR, normal S1, S2, no murmurs Lungs: CTA bilaterally, no wheezes, rhonchi, or rales Abdomen: +bs, soft, non-tender, non  distended, no masses, no hepatomegaly,  Splenomegaly noted.   Musculoskeletal: Full ROM all extremities, nontender, no swelling, no obvious deformity. Non-antalgic gait.  Extremities: no edema, no cyanosis, no clubbing Pulses: 2+ symmetric, upper and lower extremities, normal cap refill Neurological: alert, oriented x 3, CN2-12 intact, strength normal upper extremities and lower extremities, sensation normal throughout, DTRs 2+  no cerebellar signs. Psychiatric: Normal affect, behavior normal, pleasant  Breasts: declines, mammogram order in system, states will schedule, no concerns on self exams GU: no concerns, defer  EKG: NSR, NSCPT  Izora Ribas, NP 12:51 PM [facility]

## 2021-11-09 NOTE — Patient Instructions (Addendum)
?  Ms. Schrom , ?Thank you for taking time to come for your Annual Wellness Visit. I appreciate your ongoing commitment to your health goals. Please review the following plan we discussed and let me know if I can assist you in the future.  ? ?These are the goals we discussed: ? Goals   ? ?  DIET - INCREASE WATER INTAKE   ?  65-80+ ?  ?  Exercise 150 min/wk Moderate Activity   ?  Exercises to help prevent osteoporosis daily  ?  ? ?  ?  ?This is a list of the screening recommended for you and due dates:  ?Health Maintenance  ?Topic Date Due  ? COVID-19 Vaccine (1) Never done  ? Zoster (Shingles) Vaccine (1 of 2) Never done  ? Pneumonia Vaccine (3) 04/07/2019  ? Mammogram  06/23/2022  ? Colon Cancer Screening  12/13/2022  ? DEXA scan (bone density measurement)  06/24/2023  ? Tetanus Vaccine  03/23/2027  ? Flu Shot  Completed  ? Hepatitis C Screening: USPSTF Recommendation to screen - Ages 49-79 yo.  Completed  ? HPV Vaccine  Aged Out  ? ? ?Please call to schedule mammogram -  ? ?[facility]  ?7 a.m.-6:30 p.m., Monday ?7 a.m.-5 p.m., Tuesday-Friday ?Schedule an appointment by calling [phone]. ? ? ? ?Please call back with your covid 19 vaccine dates ? ?Please check with pharmacy/insurance to see if they cover shingrix vaccines - can get at the pharmacy ? ?Please work to drink at least 2-3 bottles of water ? ?Please set a reminder for weekly alendronate/fosamax  ? ?Please schedule dental and eye exams ? ?

## 2021-11-10 LAB — COMPLETE METABOLIC PANEL WITH GFR
AG Ratio: 1.5 (calc) (ref 1.0–2.5)
ALT: 24 U/L (ref 6–29)
AST: 23 U/L (ref 10–35)
Albumin: 3.6 g/dL (ref 3.6–5.1)
Alkaline phosphatase (APISO): 52 U/L (ref 37–153)
BUN: 20 mg/dL (ref 7–25)
CO2: 22 mmol/L (ref 20–32)
Calcium: 9.7 mg/dL (ref 8.6–10.4)
Chloride: 108 mmol/L (ref 98–110)
Creat: 0.89 mg/dL (ref 0.60–1.00)
Globulin: 2.4 g/dL (calc) (ref 1.9–3.7)
Glucose, Bld: 79 mg/dL (ref 65–99)
Potassium: 4 mmol/L (ref 3.5–5.3)
Sodium: 144 mmol/L (ref 135–146)
Total Bilirubin: 0.3 mg/dL (ref 0.2–1.2)
Total Protein: 6 g/dL — ABNORMAL LOW (ref 6.1–8.1)
eGFR: 69 mL/min/{1.73_m2} (ref >=60)

## 2021-11-10 LAB — CBC WITH DIFFERENTIAL/PLATELET
Absolute Monocytes: 917 cells/uL (ref 200–950)
Basophils Absolute: 42 cells/uL (ref 0–200)
Basophils Relative: 0.3 %
Eosinophils Absolute: 153 cells/uL (ref 15–500)
Eosinophils Relative: 1.1 %
HCT: 40.7 % (ref 35.0–45.0)
Hemoglobin: 13.8 g/dL (ref 11.7–15.5)
Lymphs Abs: 723 cells/uL — ABNORMAL LOW (ref 850–3900)
MCH: 31 pg (ref 27.0–33.0)
MCHC: 33.9 g/dL (ref 32.0–36.0)
MCV: 91.5 fL (ref 80.0–100.0)
MPV: 9.8 fL (ref 7.5–12.5)
Monocytes Relative: 6.6 %
Neutro Abs: 12065 cells/uL — ABNORMAL HIGH (ref 1500–7800)
Neutrophils Relative %: 86.8 %
Platelets: 185 10*3/uL (ref 140–400)
RBC: 4.45 10*6/uL (ref 3.80–5.10)
RDW: 13.4 % (ref 11.0–15.0)
Total Lymphocyte: 5.2 %
WBC: 13.9 10*3/uL — ABNORMAL HIGH (ref 3.8–10.8)

## 2021-11-10 LAB — URINALYSIS, ROUTINE W REFLEX MICROSCOPIC
Bilirubin Urine: NEGATIVE
Glucose, UA: NEGATIVE
Hgb urine dipstick: NEGATIVE
Ketones, ur: NEGATIVE
Leukocytes,Ua: NEGATIVE
Nitrite: NEGATIVE
Protein, ur: NEGATIVE
Specific Gravity, Urine: 1.021 (ref 1.001–1.035)
pH: 6 (ref 5.0–8.0)

## 2021-11-10 LAB — HEMOGLOBIN A1C
Hgb A1c MFr Bld: 5.7 % of total Hgb — ABNORMAL HIGH (ref <5.7)
Mean Plasma Glucose: 117 mg/dL
eAG (mmol/L): 6.5 mmol/L

## 2021-11-10 LAB — MICROALBUMIN / CREATININE URINE RATIO
Creatinine, Urine: 137 mg/dL (ref 20–275)
Microalb Creat Ratio: 20 mcg/mg creat (ref <30)
Microalb, Ur: 2.8 mg/dL

## 2021-11-10 LAB — VITAMIN D 25 HYDROXY (VIT D DEFICIENCY, FRACTURES): Vit D, 25-Hydroxy: 65 ng/mL (ref 30–100)

## 2021-11-10 LAB — LIPID PANEL
Cholesterol: 232 mg/dL — ABNORMAL HIGH (ref <200)
HDL: 67 mg/dL (ref >=50)
LDL Cholesterol (Calc): 138 mg/dL (calc) — ABNORMAL HIGH
Non-HDL Cholesterol (Calc): 165 mg/dL (calc) — ABNORMAL HIGH (ref <130)
Total CHOL/HDL Ratio: 3.5 (calc) (ref <5.0)
Triglycerides: 144 mg/dL (ref <150)

## 2021-11-10 LAB — MAGNESIUM: Magnesium: 2.3 mg/dL (ref 1.5–2.5)

## 2021-11-10 LAB — TSH: TSH: 1.31 mIU/L (ref 0.40–4.50)

## 2021-11-17 ENCOUNTER — Encounter: Payer: Medicare Other | Admitting: Adult Health

## 2021-12-20 ENCOUNTER — Encounter: Payer: Self-pay | Admitting: Adult Health

## 2021-12-20 ENCOUNTER — Ambulatory Visit (INDEPENDENT_AMBULATORY_CARE_PROVIDER_SITE_OTHER): Payer: Medicare Other | Admitting: Adult Health

## 2021-12-20 VITALS — BP 142/90 | HR 77 | Temp 97.2°F | Wt 120.0 lb

## 2021-12-20 DIAGNOSIS — L03119 Cellulitis of unspecified part of limb: Secondary | ICD-10-CM

## 2021-12-20 DIAGNOSIS — S80869A Insect bite (nonvenomous), unspecified lower leg, initial encounter: Secondary | ICD-10-CM

## 2021-12-20 MED ORDER — CEPHALEXIN 500 MG PO CAPS: 500.0000 mg | ORAL_CAPSULE | Freq: Three times a day (TID) | ORAL | 0 refills | Status: DC

## 2021-12-20 MED ORDER — TRIAMCINOLONE ACETONIDE 0.5 % EX CREA: 1.0000 "application " | TOPICAL_CREAM | Freq: Two times a day (BID) | CUTANEOUS | 0 refills | Status: DC | PRN

## 2021-12-20 NOTE — Progress Notes (Signed)
Assessment and Plan: ? ?Ta[place]iana was seen today for insect bite. ? ?Diagnoses and all orders for this visit: ? ?Cellulitis of lower extremity, unspecified laterality ?Insect bite of lower leg, unspecified laterality, initial encounter ?Start prescriptions as prescribed; marked borders around some infected appearing insect bite wounds and advised to call in 2-3 days if redness extending past the lines/not improving. Otherwise suggested H2i PRN itching, sparingly can use topical steroid cream on areas not open. Follow up in 1 week or sooner if needed.  ?-     cephALEXin (KEFLEX) 500 MG capsule; Take 1 capsule (500 mg total) by mouth 3 (three) times daily for 10 days. ?-     triamcinolone cream (KENALOG) 0.5 %; Apply 1 application. topically 2 (two) times daily as needed. For itching. ? ? ?Further disposition pending results of labs. Discussed med's effects and SE's.   ?Over 20 minutes of exam, counseling, chart review, and critical decision making was performed.  ? ?Future Appointments  ?Date Time Provider [place]  ?02/22/2022  9:30 AM Unk Pinto, MD [facility] None  ?05/19/2022  2:40 PM [name], MD [facility] None  ?05/30/2022 11:00 AM [name], NP [facility] None  ?11/15/2022 10:00 AM Unk Pinto, MD [facility] None  ? ? ?------------------------------------------------------------------------------------------------------------------ ? ? ?HPI ?BP (!) 142/90   Pulse 77   Temp (!) 97.2 ?F (36.2 ?C)   Wt 120 lb (54.4 kg)   SpO2 99%   BMI 21.26 kg/m?  ?71 y.o.female presents for evaluation of ant bites to legs.  ? ?She reports was working in the garden last week and disturbed a red ant hill, had bilateral legs with itching, has been scratching, has used an OTC topical for insect bites and reports this has helped but redness worsening.  ? ?Past Medical History:  ?Diagnosis Date  ? Anxiety and depression   ? Arthritis   ? Colon polyps   ? GERD (gastroesophageal reflux disease)   ?  Hemorrhoids   ? Hiatal hernia   ? History of kidney stones   ? passed  ? Hyperlipemia   ? Mixed hyperlipidemia 11/26/2013  ? Panic disorder   ? Passive smoke exposure 07/17/2018  ? Renal insufficiency   ? stage 3 kidney disease per her PCP  ? Stomach ulcer   ? Unspecified essential hypertension 11/26/2013  ?  ? ?Allergies  ?Allergen Reactions  ? Other   ?  All pain medications: Unknown/ CODEINE  ? Prednisone Other (See Comments)  ?  DYSPHORIA  ? Vioxx [Rofecoxib]   ?  unknown  ? Entex T [Pseudoephedrine-Guaifenesin] Palpitations  ? Levaquin [Levofloxacin] Rash  ?  Tolerated Avelox without adverse effect 11/08/15.  ? Sulfa Antibiotics Rash  ? Trovan [Alatrofloxacin] Rash  ? ? ?Current Outpatient Medications on File Prior to Visit  ?Medication Sig  ? ALPRAZolam (XANAX) 0.5 MG tablet Take     1/2  to 1 tablet        2 to 3 x /day         as needed for Nerves  ? Ascorbic Acid (VITAMIN C) 1000 MG tablet Take 1,000 mg by mouth daily.  ? aspirin 81 MG tablet Take 81 mg by mouth at bedtime.   ? cholecalciferol (VITAMIN D) 1000 units tablet Take 10,000 Units by mouth daily.  ? diltiazem (CARDIZEM) 120 MG tablet Take  1 tablet  2 x /day (every 12 hours)  for BP  ? famotidine (PEPCID) 20 MG tablet 20 mg daily. As needed.  ? furosemide (  LASIX) 20 MG tablet Take 1 tablet (20 mg total) by mouth daily as needed for edema.  ? meclizine (ANTIVERT) 25 MG tablet 1/2-1 pill up to 3 times daily for motion sickness/dizziness  ? Mouthwash Compounding Base LIQD Take 5 mLs by mouth every 2 (two) hours as needed.  ? potassium chloride SA (KLOR-CON) 20 MEQ tablet TAKE 1 TABLET BY MOUTH TWICE DAILY FOR POTASSIUM  ? pravastatin (PRAVACHOL) 40 MG tablet Take  1 tablet  at Bedtime  for Cholesterol  ? sertraline (ZOLOFT) 100 MG tablet TAKE 1 TABLET BY MOUTH DAILY FOR MOOD WITH FOOD  ? vitamin B-12 (CYANOCOBALAMIN) 250 MCG tablet Take 250 mcg by mouth daily.  ? alendronate (FOSAMAX) 70 MG tablet Take 1 tablet (70 mg total) by mouth once a week. Take on  empty stomach with full glass of water and avoid reclining for 30 min after taking med. (Patient not taking: Reported on 09/06/2021)  ? ?No current facility-administered medications on file prior to visit.  ? ? ?ROS: all negative except above.  ? ?Physical Exam: ? ?BP (!) 142/90   Pulse 77   Temp (!) 97.2 ?F (36.2 ?C)   Wt 120 lb (54.4 kg)   SpO2 99%   BMI 21.26 kg/m?  ? ?General Appearance: Well nourished, in no apparent distress. ?Eyes: PERRLA, conjunctiva no swelling or erythema ?ENT/Mouth: mask in place; Hearing normal.  ?Neck: Supple, thyroid normal.  ?Respiratory: Respiratory effort normal, BS equal bilaterally without rales, rhonchi, wheezing or stridor.  ?Cardio: RRR with no MRGs. Brisk peripheral pulses without edema.  ?Abdomen: Soft, + BS.  Non tender, no guarding. ?Lymphatics: Non tender without lymphadenopathy.  ?Musculoskeletal: no obvious deformity; normal gait.  ?Skin: Warm, dry; bil lower legs with numerous excoriated/scabbed areas with surrounding erythema approx 1-1.5 cm; 1 area to R shin with scant purulent type discharge.  ?Neuro: Normal muscle tone ?Psych: Awake and oriented X 3, normal affect, Insight and Judgment appropriate.  ?  ? ?Izora Ribas, NP ?10:44 AM ?[facility] ? ?

## 2021-12-26 ENCOUNTER — Other Ambulatory Visit: Payer: Self-pay | Admitting: Adult Health

## 2021-12-28 ENCOUNTER — Encounter: Payer: Self-pay | Admitting: Adult Health

## 2021-12-28 ENCOUNTER — Ambulatory Visit (INDEPENDENT_AMBULATORY_CARE_PROVIDER_SITE_OTHER): Payer: Medicare Other | Admitting: Adult Health

## 2021-12-28 VITALS — BP 142/84 | HR 87 | Temp 97.7°F | Wt 116.0 lb

## 2021-12-28 DIAGNOSIS — W57XXXD Bitten or stung by nonvenomous insect and other nonvenomous arthropods, subsequent encounter: Secondary | ICD-10-CM

## 2021-12-28 DIAGNOSIS — W19XXXA Unspecified fall, initial encounter: Secondary | ICD-10-CM

## 2021-12-28 DIAGNOSIS — S80869D Insect bite (nonvenomous), unspecified lower leg, subsequent encounter: Secondary | ICD-10-CM

## 2021-12-28 DIAGNOSIS — I10 Essential (primary) hypertension: Secondary | ICD-10-CM

## 2021-12-28 MED ORDER — CEPHALEXIN 500 MG PO CAPS: 500.0000 mg | ORAL_CAPSULE | Freq: Three times a day (TID) | ORAL | 0 refills | Status: AC

## 2021-12-28 NOTE — Progress Notes (Signed)
Assessment and Plan: ? ?Latoya [name] was seen today for follow-up. ? ?Diagnoses and all orders for this visit: ? ?Insect bite of lower leg, unspecified laterality, subsequent encounter ?Cellulitis resolved with keflex;  ?Monitor for resolution, restart abx if any border erythema, purulent discharge ?Follow up as needed ? ?Fall, initial encounter ?Mechanical, superficial wound without complication, not suggestive of any underlying problems with knee joint. No xray recommended at this time.  ?Follow up if not improving as expected or recurrent falls ? ?Essential hypertension ?Improved on recheck; ?Monitor blood pressure at home; call if consistently over 130/80 ?Continue DASH diet.   ?Discussed stress reduction -  ?Reminder to go to the ER if any CP, SOB, nausea, dizziness, severe HA, changes vision/speech, left arm numbness and tingling and jaw pain. ? ? ?Further disposition pending results of labs. Discussed med's effects and SE's.   ?Over 20 minutes of exam, counseling, chart review, and critical decision making was performed.  ? ?Future Appointments  ?Date Time Provider [place]  ?02/22/2022  9:30 AM Unk Pinto, MD [facility] None  ?05/19/2022  2:40 PM [name], MD [facility] None  ?05/30/2022 11:00 AM [name], NP [facility] None  ?11/15/2022 10:00 AM Unk Pinto, MD [facility] None  ? ? ?------------------------------------------------------------------------------------------------------------------ ? ? ?HPI ?BP (!) 160/100   Pulse 87   Temp 97.7 ?F (36.5 ?C)   Wt 116 lb (52.6 kg)   SpO2 99%   BMI 20.55 kg/m?  ?71 y.o.female presents for follow up of ant bites to legs with mild cellulitis.   ? ?She was seen 12/20/2021 for numerous ant bites to legs with erythema suggestive of early cellulitis, she is completing 10 day course of keflex 500 mg BID, much improved. She did however fall on her knee after last visit (tripped over dog in garden), has large scabbed wound, reports has been  rinsing with hydrogen peroxide and applying OTC topical abx, improving. No pain with weight bearing.  ? ?She admits hasn't been checking BPs at home, historically labile but within goal range, today their BP was initially elevated at 160/100, improved to BP: (!) 142/84 on recheck after 10 min of rest. Reports was running late and rushing -  ?She denies chest pain, shortness of breath, dizziness. ? ? ?Past Medical History:  ?Diagnosis Date  ? Anxiety and depression   ? Arthritis   ? Colon polyps   ? GERD (gastroesophageal reflux disease)   ? Hemorrhoids   ? Hiatal hernia   ? History of kidney stones   ? passed  ? Hyperlipemia   ? Mixed hyperlipidemia 11/26/2013  ? Panic disorder   ? Passive smoke exposure 07/17/2018  ? Renal insufficiency   ? stage 3 kidney disease per her PCP  ? Stomach ulcer   ? Unspecified essential hypertension 11/26/2013  ?  ? ?Allergies  ?Allergen Reactions  ? Other   ?  All pain medications: Unknown/ CODEINE  ? Prednisone Other (See Comments)  ?  DYSPHORIA  ? Vioxx [Rofecoxib]   ?  unknown  ? Entex T [Pseudoephedrine-Guaifenesin] Palpitations  ? Levaquin [Levofloxacin] Rash  ?  Tolerated Avelox without adverse effect 11/08/15.  ? Sulfa Antibiotics Rash  ? Trovan [Alatrofloxacin] Rash  ? ? ?Current Outpatient Medications on File Prior to Visit  ?Medication Sig  ? ALPRAZolam (XANAX) 0.5 MG tablet Take     1/2  to 1 tablet        2 to 3 x /day         as  needed for Nerves  ? Ascorbic Acid (VITAMIN C) 1000 MG tablet Take 1,000 mg by mouth daily.  ? aspirin 81 MG tablet Take 81 mg by mouth at bedtime.   ? cephALEXin (KEFLEX) 500 MG capsule Take 1 capsule (500 mg total) by mouth 3 (three) times daily for 10 days.  ? cholecalciferol (VITAMIN D) 1000 units tablet Take 10,000 Units by mouth daily.  ? diltiazem (CARDIZEM) 120 MG tablet Take  1 tablet  2 x /day (every 12 hours)  for BP  ? famotidine (PEPCID) 20 MG tablet 20 mg daily. As needed.  ? furosemide (LASIX) 20 MG tablet Take 1 tablet (20 mg total)  by mouth daily as needed for edema.  ? meclizine (ANTIVERT) 25 MG tablet 1/2-1 pill up to 3 times daily for motion sickness/dizziness  ? Mouthwash Compounding Base LIQD Take 5 mLs by mouth every 2 (two) hours as needed.  ? potassium chloride SA (KLOR-CON) 20 MEQ tablet TAKE 1 TABLET BY MOUTH TWICE DAILY FOR POTASSIUM  ? pravastatin (PRAVACHOL) 40 MG tablet Take  1 tablet  at Bedtime  for Cholesterol  ? sertraline (ZOLOFT) 100 MG tablet TAKE 1 TABLET BY MOUTH DAILY WITH FOOD FOR MOOD  ? triamcinolone cream (KENALOG) 0.5 % Apply 1 application. topically 2 (two) times daily as needed. For itching.  ? vitamin B-12 (CYANOCOBALAMIN) 250 MCG tablet Take 250 mcg by mouth daily.  ? alendronate (FOSAMAX) 70 MG tablet Take 1 tablet (70 mg total) by mouth once a week. Take on empty stomach with full glass of water and avoid reclining for 30 min after taking med. (Patient not taking: Reported on 09/06/2021)  ? ?No current facility-administered medications on file prior to visit.  ? ? ?ROS: all negative except above.  ? ?Physical Exam: ? ?BP (!) 160/100   Pulse 87   Temp 97.7 ?F (36.5 ?C)   Wt 116 lb (52.6 kg)   SpO2 99%   BMI 20.55 kg/m?  ? ?General Appearance: Well nourished, in no apparent distress. ?Eyes: PERRLA, conjunctiva no swelling or erythema ?ENT/Mouth: mask in place; Hearing normal.  ?Neck: Supple, thyroid normal.  ?Respiratory: Respiratory effort normal, BS equal bilaterally without rales, rhonchi, wheezing or stridor.  ?Cardio: RRR with no MRGs. Brisk peripheral pulses without edema.  ?Abdomen: Soft, + BS.  Non tender, no guarding. ?Lymphatics: Non tender without lymphadenopathy.  ?Musculoskeletal: no obvious deformity; normal gait.  ?Skin: Warm, dry; bil lower legs with healing scabbed areas,  no erythema or discharge. She does have new scabbed area to R lateral knee, 2 cm x 3 cm, minimial border erythema, no purulent discharge.  ?Neuro: Normal muscle tone ?Psych: Awake and oriented X 3, normal affect, Insight  and Judgment appropriate.  ?  ?Izora Ribas, NP ?11:51 AM ?[facility] ? ?

## 2022-01-08 ENCOUNTER — Other Ambulatory Visit: Payer: Self-pay | Admitting: Adult Health

## 2022-01-08 DIAGNOSIS — I10 Essential (primary) hypertension: Secondary | ICD-10-CM

## 2022-01-14 ENCOUNTER — Other Ambulatory Visit: Payer: Self-pay | Admitting: Adult Health

## 2022-01-17 ENCOUNTER — Other Ambulatory Visit: Payer: Self-pay | Admitting: Adult Health

## 2022-01-17 ENCOUNTER — Telehealth: Payer: Self-pay | Admitting: Adult Health

## 2022-01-17 MED ORDER — MOUTHWASH COMPOUNDING BASE PO LIQD: 5.0000 mL | ORAL | 1 refills | Status: DC | PRN

## 2022-01-17 NOTE — Telephone Encounter (Signed)
Pt is requesting a refill on Mouthwash Compounding Base LIQD ? ? ?

## 2022-01-27 ENCOUNTER — Ambulatory Visit (INDEPENDENT_AMBULATORY_CARE_PROVIDER_SITE_OTHER): Payer: Medicare Other | Admitting: Adult Health

## 2022-01-27 ENCOUNTER — Encounter: Payer: Self-pay | Admitting: Adult Health

## 2022-01-27 VITALS — BP 148/84 | HR 74 | Temp 97.5°F | Wt 116.0 lb

## 2022-01-27 DIAGNOSIS — R609 Edema, unspecified: Secondary | ICD-10-CM

## 2022-01-27 DIAGNOSIS — W57XXXD Bitten or stung by nonvenomous insect and other nonvenomous arthropods, subsequent encounter: Secondary | ICD-10-CM

## 2022-01-27 DIAGNOSIS — R233 Spontaneous ecchymoses: Secondary | ICD-10-CM

## 2022-01-27 DIAGNOSIS — I776 Arteritis, unspecified: Secondary | ICD-10-CM | POA: Diagnosis not present

## 2022-01-27 DIAGNOSIS — L089 Local infection of the skin and subcutaneous tissue, unspecified: Secondary | ICD-10-CM

## 2022-01-27 MED ORDER — DEXAMETHASONE 1 MG PO TABS: ORAL_TABLET | ORAL | 0 refills | Status: DC

## 2022-01-27 MED ORDER — DOXYCYCLINE HYCLATE 100 MG PO CAPS
ORAL_CAPSULE | ORAL | 0 refills | Status: DC
Start: 2022-01-27 — End: 2022-02-14

## 2022-01-27 NOTE — Progress Notes (Addendum)
Assessment and Plan:  Latoya [name] was seen today for edema.  Diagnoses and all orders for this visit:  Insect bite of lower leg, unspecified laterality, subsequent encounter Infected wound Close follow up in 10-14 days or sooner if any worsening sx Poor wound healing - ongoing 6 weeks, denies new lesions Poor lifestyle/ ? Malnourished - discussed protein intake, add vit C 500 mg BID, zinc 20-30 mg daily, vit D supplement -     doxycycline (VIBRAMYCIN) 100 MG capsule; Take 1 capsule 2 x/day with food for 10 days.  Edema, unspecified type ? R/t poorly healing ant bites sustained 6 weeks ago and poor lifestyle - elevate legs TID, increase activity, increase water, decrease sodium intake.  Wear compression socks more routinely if available. Check labs. Take furosemide daily until follow up in 10-14 days.  -     CBC with Differential/Platelet -     BASIC METABOLIC PANEL WITH GFR -     Urinalysis, Routine w reflex microscopic  Petechial rash Suspect secondary to edema, no system sx but will r/o vasculitis -     CBC with Differential/Platelet -     Sedimentation rate -     C-reactive protein -     C3 and C4 -     dexamethasone (DECADRON) 1 MG tablet; Take 3 tab for 3 days, then 2 tabs for 3 days, then 1 tab daily. Take in the morning with food.  Further disposition pending results of labs. Discussed med's effects and SE's.   Over 30 minutes of exam, counseling, chart review, and critical decision making was performed.   Future Appointments  Date Time Provider [place]  02/08/2022  1:45 PM [name], NP [facility] None  02/22/2022  9:30 AM Unk Pinto, MD [facility] None  05/19/2022  2:40 PM [name], MD [facility] None  05/30/2022 11:00 AM [name], NP [facility] None  11/15/2022 10:00 AM Unk Pinto, MD [facility] None    ------------------------------------------------------------------------------------------------------------------   HPI BP (!)  148/84   Pulse 74   Temp (!) 97.5 F (36.4 C)   Wt 116 lb (52.6 kg)   SpO2 96%   BMI 20.55 kg/m  71 y.o.female presents for evaluation of bil ankle edema x 1 week.   Hx of intermittent mild depending ankle edema, takes furosemide 20 mg PRN irregularly. She reports increased swelling persistent in the last week, still taking furosemide intermittently, limited benefit.   Notably she was bitten by ants in her garden 6 weeks ago, itching has resolved, comleted keflex but lesions have persisted. She denies knowledge of new lesions. Denies itching/scratching.   Denies CP, dyspnea, PND, palpitations, dizziness, HA, vision changes. No hx of CHF Admittedly with poor lifestyle, will not eat, drink minimal water then go outside and work all day in her garden. Lifestyle has been poor since husband passed. Only 1 bottle of water some days. She endorses ongoing intermittent fatigue.  Wt Readings from Last 3 Encounters:  01/27/22 116 lb (52.6 kg)  12/28/21 116 lb (52.6 kg)  12/20/21 120 lb (54.4 kg)     Past Medical History:  Diagnosis Date   Anxiety and depression    Arthritis    Colon polyps    GERD (gastroesophageal reflux disease)    Hemorrhoids    Hiatal hernia    History of kidney stones    passed   Hyperlipemia    Mixed hyperlipidemia 11/26/2013   Panic disorder    Passive smoke exposure 07/17/2018   Renal insufficiency  stage 3 kidney disease per her PCP   Stomach ulcer    Unspecified essential hypertension 11/26/2013     Allergies  Allergen Reactions   Other     All pain medications: Unknown/ CODEINE   Prednisone Other (See Comments)    DYSPHORIA   Vioxx [Rofecoxib]     unknown   Entex T [Pseudoephedrine-Guaifenesin] Palpitations   Levaquin [Levofloxacin] Rash    Tolerated Avelox without adverse effect 11/08/15.   Sulfa Antibiotics Rash   Trovan [Alatrofloxacin] Rash    Current Outpatient Medications on File Prior to Visit  Medication Sig   alendronate (FOSAMAX) 70  MG tablet Take 1 tablet (70 mg total) by mouth once a week. Take on empty stomach with full glass of water and avoid reclining for 30 min after taking med.   ALPRAZolam (XANAX) 0.5 MG tablet Take     1/2  to 1 tablet        2 to 3 x /day         as needed for Nerves   Ascorbic Acid (VITAMIN C) 1000 MG tablet Take 1,000 mg by mouth daily.   aspirin 81 MG tablet Take 81 mg by mouth at bedtime.    cholecalciferol (VITAMIN D) 1000 units tablet Take 10,000 Units by mouth daily.   diltiazem (CARDIZEM) 120 MG tablet Take  1 tablet  2 x /day (every 12 hours)  for BP                                      /                 TAKE                           BY                     MOUTH   famotidine (PEPCID) 20 MG tablet 20 mg daily. As needed.   furosemide (LASIX) 20 MG tablet Take 1 tablet (20 mg total) by mouth daily as needed for edema.   meclizine (ANTIVERT) 25 MG tablet 1/2-1 pill up to 3 times daily for motion sickness/dizziness   Mouthwash Compounding Base LIQD Take 5 mLs by mouth every 2 (two) hours as needed.   potassium chloride SA (KLOR-CON) 20 MEQ tablet TAKE 1 TABLET BY MOUTH TWICE DAILY FOR POTASSIUM   pravastatin (PRAVACHOL) 40 MG tablet Take  1 tablet  at Bedtime  for Cholesterol   sertraline (ZOLOFT) 100 MG tablet Take  1 tablet  Daily for Mood                                              /                 TAKE                         BY                        MOUTH   triamcinolone cream (KENALOG) 0.5 % Apply 1 application. topically 2 (two) times daily as needed. For itching.   vitamin B-12 (CYANOCOBALAMIN) 250 MCG  tablet Take 250 mcg by mouth daily.   No current facility-administered medications on file prior to visit.    ROS: all negative except above.   Physical Exam:  BP (!) 148/84   Pulse 74   Temp (!) 97.5 F (36.4 C)   Wt 116 lb (52.6 kg)   SpO2 96%   BMI 20.55 kg/m   General Appearance: Well nourished, in no apparent distress. Eyes: PERRLA, conjunctiva no swelling or  erythema ENT/Mouth: No erythema, swelling, or exudate on post pharynx.  Tonsils not swollen or erythematous. Hearing normal.  Neck: Supple Respiratory: Respiratory effort normal, BS equal bilaterally without rales, rhonchi, wheezing or stridor.  Cardio: RRR with no MRGs. Brisk peripheral pulses. Bil ankles/lower legs with 2+ pitting edema, tight, shiny. No weeping.  Abdomen: Soft, + BS.  Non tender Lymphatics: Non tender without lymphadenopathy.  Musculoskeletal: Full ROM, 5/5 strength, normal gait.  Skin: Warm, dry; bil lower legs and lower arms with scabbed lesions, some with open surface, mild erythema surrounding lesions; has petichiae down bil shins. No rash or lesion to torso, upper arms or legs.  Neuro: Normal muscle tone, Sensation intact.  Psych: Awake and oriented X 3, normal affect, Insight and Judgment appropriate.        Izora Ribas, NP 12:43 PM [facility]

## 2022-01-27 NOTE — Patient Instructions (Signed)
   Please take full course of dexamethasone as prescribed -   Doxycycline - antibiotic - as prescribed  Please have at least 2 full meals - protein (eggs, chicken/fish, beans) with each meal, with plenty of fruits and veggies   Add citrus, sweet peppers for vitamin C  Add vitamin C 500 mg twice daily  Zinc 20-30 mg daily  Continue vitamin D supplement as previously advised   Please take furosemide daily while you have swelling  Can restart triamcinolone cream to legs twice daily and wrap   Avoiding sitting on bare ground, avoid scratching legs  Please sit with legs elevated whenever possible  Drink plenty of water and limit salt in diet

## 2022-01-28 LAB — CBC WITH DIFFERENTIAL/PLATELET
Absolute Monocytes: 662 cells/uL (ref 200–950)
Basophils Absolute: 38 cells/uL (ref 0–200)
Basophils Relative: 0.6 %
Eosinophils Absolute: 101 cells/uL (ref 15–500)
Eosinophils Relative: 1.6 %
HCT: 38 % (ref 35.0–45.0)
Hemoglobin: 12.8 g/dL (ref 11.7–15.5)
Lymphs Abs: 1121 cells/uL (ref 850–3900)
MCH: 31.5 pg (ref 27.0–33.0)
MCHC: 33.7 g/dL (ref 32.0–36.0)
MCV: 93.6 fL (ref 80.0–100.0)
MPV: 9.8 fL (ref 7.5–12.5)
Monocytes Relative: 10.5 %
Neutro Abs: 4379 cells/uL (ref 1500–7800)
Neutrophils Relative %: 69.5 %
Platelets: 214 10*3/uL (ref 140–400)
RBC: 4.06 10*6/uL (ref 3.80–5.10)
RDW: 12.7 % (ref 11.0–15.0)
Total Lymphocyte: 17.8 %
WBC: 6.3 10*3/uL (ref 3.8–10.8)

## 2022-01-28 LAB — C-REACTIVE PROTEIN: CRP: 1.2 mg/L (ref <8.0)

## 2022-01-28 LAB — C3 AND C4
C3 Complement: 123 mg/dL (ref 83–193)
C4 Complement: 31 mg/dL (ref 15–57)

## 2022-01-28 LAB — BASIC METABOLIC PANEL WITH GFR
BUN: 16 mg/dL (ref 7–25)
CO2: 26 mmol/L (ref 20–32)
Calcium: 9.4 mg/dL (ref 8.6–10.4)
Chloride: 110 mmol/L (ref 98–110)
Creat: 0.91 mg/dL (ref 0.60–1.00)
Glucose, Bld: 62 mg/dL — ABNORMAL LOW (ref 65–99)
Potassium: 3.5 mmol/L (ref 3.5–5.3)
Sodium: 145 mmol/L (ref 135–146)
eGFR: 67 mL/min/{1.73_m2} (ref >=60)

## 2022-01-28 LAB — URINALYSIS, ROUTINE W REFLEX MICROSCOPIC
Bilirubin Urine: NEGATIVE
Glucose, UA: NEGATIVE
Hgb urine dipstick: NEGATIVE
Ketones, ur: NEGATIVE
Leukocytes,Ua: NEGATIVE
Nitrite: NEGATIVE
Protein, ur: NEGATIVE
Specific Gravity, Urine: 1.016 (ref 1.001–1.035)
pH: 6 (ref 5.0–8.0)

## 2022-01-28 LAB — SEDIMENTATION RATE: Sed Rate: 22 mm/h (ref 0–30)

## 2022-01-31 ENCOUNTER — Other Ambulatory Visit: Payer: Self-pay | Admitting: Adult Health

## 2022-01-31 ENCOUNTER — Telehealth: Payer: Self-pay | Admitting: Internal Medicine

## 2022-01-31 ENCOUNTER — Other Ambulatory Visit: Payer: Self-pay

## 2022-01-31 ENCOUNTER — Other Ambulatory Visit: Payer: Self-pay | Admitting: Internal Medicine

## 2022-01-31 ENCOUNTER — Telehealth: Payer: Self-pay | Admitting: Adult Health

## 2022-01-31 DIAGNOSIS — M549 Dorsalgia, unspecified: Secondary | ICD-10-CM

## 2022-01-31 NOTE — Telephone Encounter (Signed)
Pt said pharmacy never got the prescription for [facility]. Please resend

## 2022-01-31 NOTE — Progress Notes (Signed)
  Chronic Care Management   Note  01/31/2022 Name: Latoya [name] MRN: [id number] DOB: 03/26/1951  Latoya [name] is a 71 y.o. year old female who is a primary care patient of Unk Pinto, MD. I reached out to Mechele Claude by phone today in response to a referral sent by [name] PCP, Unk Pinto, MD.   [name] was given information about Chronic Care Management services today including:  CCM service includes personalized support from designated clinical staff supervised by her physician, including individualized plan of care and coordination with other care providers 24/7 contact phone numbers for assistance for urgent and routine care needs. Service will only be billed when office clinical staff spend 20 minutes or more in a month to coordinate care. Only one practitioner may furnish and bill the service in a calendar month. The patient may stop CCM services at any time (effective at the end of the month) by phone call to the office staff.   Patient agreed to services and verbal consent obtained.   Follow up [place]

## 2022-02-01 NOTE — Progress Notes (Signed)
Patient is aware of lab results and instructions.  She states that she is requesting the mouthwash because when she eats, her tongue gets raw. She also states that she has misplaced her "nerve pills" and is wondering if we can send in another script. She hasn't slept any. -e. Judeth Horn

## 2022-02-01 NOTE — Telephone Encounter (Signed)
Left message on voice mail  to call back

## 2022-02-03 ENCOUNTER — Other Ambulatory Visit: Payer: Self-pay | Admitting: Adult Health

## 2022-02-03 MED ORDER — ALPRAZOLAM 0.5 MG PO TABS
ORAL_TABLET | ORAL | 0 refills | Status: DC
Start: 2022-02-03 — End: 2023-08-07

## 2022-02-03 NOTE — Progress Notes (Signed)
Future Appointments  Date Time Provider [place]  02/08/2022  1:45 PM [name], NP [facility] None  02/22/2022  9:30 AM Unk Pinto, MD [facility] None  02/28/2022  9:30 AM Carleene Mains, RPH [facility] None  05/19/2022  2:40 PM [name], MD [facility] None  05/30/2022 11:00 AM [name], NP [facility] None  11/15/2022 10:00 AM Unk Pinto, MD [facility] None   PDMP reviewed for alprazolam refill request.

## 2022-02-08 ENCOUNTER — Ambulatory Visit: Payer: Medicare Other | Admitting: Adult Health

## 2022-02-08 NOTE — Progress Notes (Deleted)
Assessment and Plan:  Latoya [name] was seen today for edema.  Diagnoses and all orders for this visit:  Insect bite of lower leg, unspecified laterality, subsequent encounter Infected wound Close follow up in 10-14 days or sooner if any worsening sx Poor wound healing - ongoing 6 weeks, denies new lesions Poor lifestyle/ ? Malnourished - discussed protein intake, add vit C 500 mg BID, zinc 20-30 mg daily, vit D supplement -     doxycycline (VIBRAMYCIN) 100 MG capsule; Take 1 capsule 2 x/day with food for 10 days.  Edema, unspecified type ? R/t poorly healing ant bites sustained 6 weeks ago and poor lifestyle - elevate legs TID, increase activity, increase water, decrease sodium intake.  Wear compression socks more routinely if available. Check labs. Take furosemide daily until follow up in 10-14 days.  -     CBC with Differential/Platelet -     BASIC METABOLIC PANEL WITH GFR -     Urinalysis, Routine w reflex microscopic  Petechial rash Suspect secondary to edema, no system sx but will r/o vasculitis -     CBC with Differential/Platelet -     Sedimentation rate -     C-reactive protein -     C3 and C4 -     dexamethasone (DECADRON) 1 MG tablet; Take 3 tab for 3 days, then 2 tabs for 3 days, then 1 tab daily. Take in the morning with food.  Further disposition pending results of labs. Discussed med's effects and SE's.   Over 30 minutes of exam, counseling, chart review, and critical decision making was performed.   Future Appointments  Date Time Provider [place]  02/08/2022  1:45 PM [name], NP [facility] None  02/22/2022  9:30 AM Unk Pinto, MD [facility] None  02/28/2022  9:30 AM Carleene Mains, RPH [facility] None  05/19/2022  2:40 PM [name], MD [facility] None  05/30/2022 11:00 AM Alycia Rossetti, NP [facility] None  11/15/2022 10:00 AM Unk Pinto, MD [facility] None     ------------------------------------------------------------------------------------------------------------------   HPI There were no vitals taken for this visit. 71 y.o.female presents for 10 day follow up on leg rash/insect bites/edema.   Hx of intermittent mild depending ankle edema, takes furosemide 20 mg PRN irregularly. She reports increased swelling persistent in the last week, still taking furosemide intermittently, limited benefit.   Notably she was bitten by ants in her garden 6 weeks ago, itching has resolved, comleted keflex but lesions have persisted. She denies knowledge of new lesions. Denies itching/scratching. Had new petichial rash last visit with persistent edema. Had normal CBC, BMP/GFR, UA, ESR/CRP, complements last visit.   Started on decadron taper, doxycycline *** Furosemide ***   Admittedly with poor lifestyle, will not eat, drink minimal water then go outside and work all day in her garden. Lifestyle has been poor since husband passed. Only 1 bottle of water some days. She endorses ongoing intermittent fatigue.  Wt Readings from Last 3 Encounters:  01/27/22 116 lb (52.6 kg)  12/28/21 116 lb (52.6 kg)  12/20/21 120 lb (54.4 kg)     Past Medical History:  Diagnosis Date   Anxiety and depression    Arthritis    Colon polyps    GERD (gastroesophageal reflux disease)    Hemorrhoids    Hiatal hernia    History of kidney stones    passed   Hyperlipemia    Mixed hyperlipidemia 11/26/2013   Panic disorder    Passive smoke exposure 07/17/2018   Renal  insufficiency    stage 3 kidney disease per her PCP   Stomach ulcer    Unspecified essential hypertension 11/26/2013     Allergies  Allergen Reactions   Other     All pain medications: Unknown/ CODEINE   Prednisone Other (See Comments)    DYSPHORIA   Vioxx [Rofecoxib]     unknown   Entex T [Pseudoephedrine-Guaifenesin] Palpitations   Levaquin [Levofloxacin] Rash    Tolerated Avelox without adverse  effect 11/08/15.   Sulfa Antibiotics Rash   Trovan [Alatrofloxacin] Rash    Current Outpatient Medications on File Prior to Visit  Medication Sig   alendronate (FOSAMAX) 70 MG tablet Take 1 tablet (70 mg total) by mouth once a week. Take on empty stomach with full glass of water and avoid reclining for 30 min after taking med.   ALPRAZolam (XANAX) 0.5 MG tablet Take 1/2  to 1 tablet 2 to 3 x /day as needed for Nerves   Ascorbic Acid (VITAMIN C) 1000 MG tablet Take 1,000 mg by mouth daily.   aspirin 81 MG tablet Take 81 mg by mouth at bedtime.    cholecalciferol (VITAMIN D) 1000 units tablet Take 10,000 Units by mouth daily.   dexamethasone (DECADRON) 1 MG tablet Take 3 tab for 3 days, then 2 tabs for 3 days, then 1 tab daily. Take in the morning with food.   diltiazem (CARDIZEM) 120 MG tablet Take  1 tablet  2 x /day (every 12 hours)  for BP                                      /                 TAKE                           BY                     MOUTH   doxycycline (VIBRAMYCIN) 100 MG capsule Take 1 capsule 2 x/day with food for 10 days.   famotidine (PEPCID) 20 MG tablet 20 mg daily. As needed.   furosemide (LASIX) 20 MG tablet Take 1 tablet (20 mg total) by mouth daily as needed for edema.   meclizine (ANTIVERT) 25 MG tablet 1/2-1 pill up to 3 times daily for motion sickness/dizziness   potassium chloride SA (KLOR-CON) 20 MEQ tablet TAKE 1 TABLET BY MOUTH TWICE DAILY FOR POTASSIUM   pravastatin (PRAVACHOL) 40 MG tablet Take  1 tablet  at Bedtime  for Cholesterol   sertraline (ZOLOFT) 100 MG tablet Take  1 tablet  Daily for Mood                                              /                 TAKE                         BY                        MOUTH   triamcinolone cream (KENALOG) 0.5 % APPLY TOPICALLY TO THE AFFECTED  AREA TWICE DAILY AS NEEDED FOR ITCHING   vitamin B-12 (CYANOCOBALAMIN) 250 MCG tablet Take 250 mcg by mouth daily.   No current facility-administered medications on file  prior to visit.    ROS: all negative except above.   Physical Exam:  There were no vitals taken for this visit.  General Appearance: Well nourished, in no apparent distress. Eyes: PERRLA, conjunctiva no swelling or erythema ENT/Mouth: No erythema, swelling, or exudate on post pharynx.  Tonsils not swollen or erythematous. Hearing normal.  Neck: Supple Respiratory: Respiratory effort normal, BS equal bilaterally without rales, rhonchi, wheezing or stridor.  Cardio: RRR with no MRGs. Brisk peripheral pulses. Bil ankles/lower legs with 2+ pitting edema, tight, shiny. No weeping.  Abdomen: Soft, + BS.  Non tender Lymphatics: Non tender without lymphadenopathy.  Musculoskeletal: Full ROM, 5/5 strength, normal gait.  Skin: Warm, dry; bil lower legs and lower arms with scabbed lesions, some with open surface, mild erythema surrounding lesions; has petichiae down bil shins. No rash or lesion to torso, upper arms or legs.  Neuro: Normal muscle tone, Sensation intact.  Psych: Awake and oriented X 3, normal affect, Insight and Judgment appropriate.        Izora Ribas, NP 1:19 PM [facility]

## 2022-02-14 ENCOUNTER — Encounter: Payer: Self-pay | Admitting: Adult Health

## 2022-02-14 ENCOUNTER — Telehealth: Payer: Self-pay | Admitting: Adult Health

## 2022-02-14 ENCOUNTER — Ambulatory Visit (INDEPENDENT_AMBULATORY_CARE_PROVIDER_SITE_OTHER): Payer: Medicare Other | Admitting: Adult Health

## 2022-02-14 VITALS — BP 130/82 | HR 83 | Temp 97.5°F | Wt 120.0 lb

## 2022-02-14 DIAGNOSIS — L089 Local infection of the skin and subcutaneous tissue, unspecified: Secondary | ICD-10-CM | POA: Diagnosis not present

## 2022-02-14 DIAGNOSIS — R609 Edema, unspecified: Secondary | ICD-10-CM

## 2022-02-14 MED ORDER — DOXYCYCLINE HYCLATE 100 MG PO CAPS: ORAL_CAPSULE | ORAL | 0 refills | Status: DC

## 2022-02-14 MED ORDER — TRIAMCINOLONE ACETONIDE 0.5 % EX CREA
TOPICAL_CREAM | CUTANEOUS | 1 refills | Status: DC
Start: 2022-02-14 — End: 2022-05-15

## 2022-02-14 NOTE — Telephone Encounter (Signed)
Pt forgot to mention pain in her R shoulder due to a fall she had over a week ago. Wanting to know if Latoya [name] thinks she should have an xray done or just let the pain subside on its own.

## 2022-02-14 NOTE — Progress Notes (Signed)
Assessment and Plan:  Latoya [name] was seen today for edema.  Diagnoses and all orders for this visit:  Insect bite of lower leg, unspecified laterality, subsequent encounter Infected wound Has improved with doxycycline, will refill to continue as she still has open lesions, some mild erythema, ? Scant purulence to 1 lesion Poor wound healing - ongoing 8 weeks, denies new lesions Poor lifestyle/ ? Malnourished - discussed protein intake, add vit C 500 mg BID, zinc 20-30 mg daily, vit D supplement -     doxycycline (VIBRAMYCIN) 100 MG capsule; Take 1 capsule 2 x/day with food for 10 days.  Edema, unspecified type ? R/t poorly healing ant bites sustained 8 weeks ago and poor lifestyle - elevate legs TID, increase activity, - advised to apply knee high compression hose daily in AM - advised low sodium, at least 4 bottles of water daily - had normal labs - take lasix 20 mg PRN if compression not controlling edema - follow up as scheduled   Petechial rash Resolved -   Discussed med's effects and SE's.   Over 20 minutes of exam, counseling, chart review, and critical decision making was performed.   Future Appointments  Date Time Provider Numa  02/22/2022  9:30 AM Unk Pinto, MD [facility] None  02/28/2022  9:30 AM Carleene Mains, RPH [facility] None  05/19/2022  2:40 PM [name], MD [facility] None  05/30/2022 11:00 AM Alycia Rossetti, NP [facility] None  11/15/2022 10:00 AM Unk Pinto, MD [facility] None    ------------------------------------------------------------------------------------------------------------------   HPI BP 130/82   Pulse 83   Temp (!) 97.5 F (36.4 C)   Wt 120 lb (54.4 kg)   SpO2 99%   BMI 21.26 kg/m  71 y.o.female presents for 2 week follow up on leg rash/insect bites/edema.   Notably she was bitten by ants in her garden 8 weeks ago, itching has resolved, comleted keflex but lesions have persisted. Was having 2+ pitting  edema, petichial rash last visit. She denied itching or new lesions.  Had normal CBC, BMP/GFR, UA, ESR/CRP, complements last visit.   She was prescribed decadron taper, doxycycline 100 mg BID 10 days, was advised compression hose, improved diet, vit C and zinc supplements. She reports did complete medications as prescribed but admits didn't implement other recommendations. Taking furosemide intermittently. She does feel rash/lesions/edema has improved some.   Admittedly with poor lifestyle, will not eat, drink minimal water then go outside and work all day in her garden. Lifestyle has been poor since husband passed. Only 1 bottle of water some days. She endorses ongoing intermittent fatigue.  Wt Readings from Last 3 Encounters:  02/14/22 120 lb (54.4 kg)  01/27/22 116 lb (52.6 kg)  12/28/21 116 lb (52.6 kg)     Past Medical History:  Diagnosis Date   Anxiety and depression    Arthritis    Colon polyps    GERD (gastroesophageal reflux disease)    Hemorrhoids    Hiatal hernia    History of kidney stones    passed   Hyperlipemia    Mixed hyperlipidemia 11/26/2013   Panic disorder    Passive smoke exposure 07/17/2018   Renal insufficiency    stage 3 kidney disease per her PCP   Stomach ulcer    Unspecified essential hypertension 11/26/2013     Allergies  Allergen Reactions   Other     All pain medications: Unknown/ CODEINE   Prednisone Other (See Comments)    DYSPHORIA   Vioxx [Rofecoxib]  unknown   Entex T [Pseudoephedrine-Guaifenesin] Palpitations   Levaquin [Levofloxacin] Rash    Tolerated Avelox without adverse effect 11/08/15.   Sulfa Antibiotics Rash   Trovan [Alatrofloxacin] Rash    Current Outpatient Medications on File Prior to Visit  Medication Sig   alendronate (FOSAMAX) 70 MG tablet Take 1 tablet (70 mg total) by mouth once a week. Take on empty stomach with full glass of water and avoid reclining for 30 min after taking med.   ALPRAZolam (XANAX) 0.5 MG  tablet Take 1/2  to 1 tablet 2 to 3 x /day as needed for Nerves   Ascorbic Acid (VITAMIN C) 1000 MG tablet Take 1,000 mg by mouth daily.   aspirin 81 MG tablet Take 81 mg by mouth at bedtime.    cholecalciferol (VITAMIN D) 1000 units tablet Take 10,000 Units by mouth daily.   dexamethasone (DECADRON) 1 MG tablet Take 3 tab for 3 days, then 2 tabs for 3 days, then 1 tab daily. Take in the morning with food.   diltiazem (CARDIZEM) 120 MG tablet Take  1 tablet  2 x /day (every 12 hours)  for BP                                      /                 TAKE                           BY                     MOUTH   doxycycline (VIBRAMYCIN) 100 MG capsule Take 1 capsule 2 x/day with food for 10 days.   famotidine (PEPCID) 20 MG tablet 20 mg daily. As needed.   furosemide (LASIX) 20 MG tablet Take 1 tablet (20 mg total) by mouth daily as needed for edema.   meclizine (ANTIVERT) 25 MG tablet 1/2-1 pill up to 3 times daily for motion sickness/dizziness   potassium chloride SA (KLOR-CON) 20 MEQ tablet TAKE 1 TABLET BY MOUTH TWICE DAILY FOR POTASSIUM   pravastatin (PRAVACHOL) 40 MG tablet Take  1 tablet  at Bedtime  for Cholesterol   sertraline (ZOLOFT) 100 MG tablet Take  1 tablet  Daily for Mood                                              /                 TAKE                         BY                        MOUTH   triamcinolone cream (KENALOG) 0.5 % APPLY TOPICALLY TO THE AFFECTED AREA TWICE DAILY AS NEEDED FOR ITCHING   vitamin B-12 (CYANOCOBALAMIN) 250 MCG tablet Take 250 mcg by mouth daily.   No current facility-administered medications on file prior to visit.    ROS: all negative except above.   Physical Exam:  BP 130/82   Pulse 83   Temp (!) 97.5 F (36.4 C)  Wt 120 lb (54.4 kg)   SpO2 99%   BMI 21.26 kg/m   General Appearance: Well nourished, in no apparent distress. Eyes: PERRLA, conjunctiva no swelling or erythema ENT/Mouth: No erythema, swelling, or exudate on post pharynx.   Tonsils not swollen or erythematous. Hearing normal.  Neck: Supple Respiratory: Respiratory effort normal, BS equal bilaterally without rales, rhonchi, wheezing or stridor.  Cardio: RRR with no MRGs. Brisk peripheral pulses. Bil ankles/lower legs with 1+ pitting edema, tight, shiny. No weeping.  Abdomen: Soft, + BS.  Non tender Lymphatics: Non tender without lymphadenopathy.  Musculoskeletal: Full ROM, 5/5 strength, normal gait.  Skin: Warm, dry; bil lower legs and lower arms with scabbed lesions, some with open surface, mild erythema surrounding lesions. No rash or lesion to torso, upper arms or legs.  Neuro: Normal muscle tone, Sensation intact.  Psych: Awake and oriented X 3, normal affect, Insight and Judgment appropriate.         Izora Ribas, NP 9:44 AM [name]

## 2022-02-14 NOTE — Patient Instructions (Signed)
Doxycycline - antibiotic - as prescribed Please have at least 2 full meals - protein (eggs, chicken/fish, beans) with each meal, with plenty of fruits and veggies Add citrus, sweet peppers for vitamin C Add vitamin C 500 mg tab twice daily Zinc 20-30 mg daily Continue vitamin D supplement as previously advised Please apply knee high compression socks daily in the morning, take off in the evening Push water intake, 4 bottles of water or sugar free clear soda/juice daily   Please take furosemide daily as needed while you have swelling Can restart triamcinolone cream to legs twice daily and wrap Avoiding sitting on bare ground, avoid scratching legs Please sit with legs elevated whenever possible Drink plenty of water and limit salt in diet

## 2022-02-15 ENCOUNTER — Other Ambulatory Visit: Payer: Self-pay | Admitting: Adult Health

## 2022-02-15 DIAGNOSIS — M25511 Pain in right shoulder: Secondary | ICD-10-CM

## 2022-02-15 NOTE — Telephone Encounter (Signed)
Patient notified about getting an xray

## 2022-02-20 NOTE — Progress Notes (Unsigned)
Future Appointments  Date Time Provider Department  02/22/2022                3 mo OV   9:30 AM Unk Pinto, MD [facility]  02/28/2022  9:30 AM Carleene Mains, [facility]  05/19/2022  2:40 PM [name], MD [facility]  05/30/2022               Wellness  11:00 AM Alycia Rossetti, NP [facility]  11/15/2022                CPE  10:00 AM Unk Pinto, MD [facility]    History of Present Illness:       This very nice 71 y.o.  WWF presents for 6 month follow up with HTN, HLD, Pre-Diabetes and Vitamin D Deficiency.        Patient has hx/o Chronic Anxiety & Depression & has been followed by Dr Casimiro Needle in the past.      Patient is treated for HTN (1985)  & BP has been controlled at home. Today's BP is at goal - 124/82. Patient has had no complaints of any cardiac type chest pain, palpitations, dyspnea / orthopnea / PND, dizziness, claudication, or dependent edema.      Hyperlipidemia is  NOT controlled with diet & meds. Patient denies myalgias or other med SE's. Last Lipids were NOT at goal:  Lab Results  Component Value Date   CHOL 232 (H) 11/09/2021   HDL 67 11/09/2021   LDLCALC 138 (H) 11/09/2021   TRIG 144 11/09/2021   CHOLHDL 3.5 11/09/2021    Also, the patient has history of PreDiabetes (A1c 5.8% /2014 & 5.7% /2015 w/elevated Insulin 40)  and has had no symptoms of reactive hypoglycemia, diabetic polys, paresthesias or visual blurring.  Last A1c was  near goal:  Lab Results  Component Value Date   HGBA1C 5.7 (H) 11/09/2021                                                       Further, the patient also has history of Vitamin D Deficiency and supplements vitamin D without any suspected side-effects. Last vitamin D was at goal:  Lab Results  Component Value Date   VD25OH 44 11/09/2021        Current Outpatient Medications on File Prior to Visit  Medication Sig   alendronate (FOSAMAX) 70 MG tablet Take 1 tablet  1 x /week    aspirin 81 MG tablet Take  81 mg by mouth at bedtime.    cholecalciferol (VITAMIN D) 1000 units tablet Take 10,000 Units by mouth daily.   clonazePAM (KLONOPIN) 1 MG tablet clonazepam 1 mg tablet   diltiazem (CARDIZEM) 120 MG tablet Take 1 tablet 2 x /day for BP   famotidine (PEPCID) 20 MG tablet 20 mg daily. As needed.   fexofenadine (ALLEGRA) 180 MG tablet Take 1 tablet (180 mg total) by mouth daily.   FLONASE  nasal spray 2 SPRAYS AT BEDTIME    furosemide (LASIX) 20 MG tablet Take 1 tablet ( daily as needed for edema.   meclizine (ANTIVERT) 25 MG tablet 1/2-1 pill up to 3 times daily    K-DUR 20 MEQ tablet Take 1 tablet 2 x /day for Potassium   pravastatin (PRAVACHOL) 40 MG tablet Take 1 tablet Daily for  Cholesterol   promethazine-DM) 6.25-15 MG/5ML syrup TAKE 5 ML  4 x / DAILY    propranolol (INDERAL) 10 MG tablet TAKE 1 TABLET TWICE DAILY FOR ANXIETY   sertraline (ZOLOFT) 100 MG tablet Take 1 tablet Daily for  Mood     Allergies  Allergen Reactions   Other     All pain medications: Unknown/ CODEINE   Prednisone Other (See Comments)    DYSPHORIA   Vioxx [Rofecoxib]     unknown   Entex T [Pseudoephedrine-Guaifenesin] Palpitations   Levaquin [Levofloxacin] Rash    Tolerated Avelox without adverse effect 11/08/15.   Sulfa Antibiotics Rash   Trovan [Alatrofloxacin] Rash    PMHx:   Past Medical History:  Diagnosis Date   Anxiety and depression    Arthritis    Colon polyps    GERD (gastroesophageal reflux disease)    Hemorrhoids    Hiatal hernia    History of kidney stones    passed   Hyperlipemia    Mixed hyperlipidemia 11/26/2013   Panic disorder    Passive smoke exposure 07/17/2018   Renal insufficiency    stage 3 kidney disease per her PCP   Stomach ulcer    Unspecified essential hypertension 11/26/2013    Immunization History  Administered Date(s) Administered   Influenza Split 06/04/2014, 06/05/2015   Influenza, High Dose Seasonal PF 05/30/2017, 05/22/2018, 05/21/2019    Influenza-Unspecified 06/06/2013, 06/13/2016   Pneumococcal Conjugate-13 06/05/2015   Pneumococcal Polysaccharide-23 04/06/2014   Pneumococcal-Unspecified 09/04/1996, 09/04/2016   Td 09/04/2005   Tdap 03/22/2017    Past Surgical History:  Procedure Laterality Date   ABDOMINAL HYSTERECTOMY     CARPOMETACARPEL SUSPENSION PLASTY Right 12/08/2013   Procedure: CARPOMETACARPEL [facility]) SUSPENSION PLASTY;  Surgeon: Jolyn Nap, MD;  Location: [place];  Service: Orthopedics;  Laterality: Right;   CATARACT EXTRACTION Right 2020   Dr. Pandora Leiter    COLONOSCOPY     FOOT SURGERY Left 2008   Bunion and pulled tendons   HAND SURGERY Right    Dog Bite   I & D EXTREMITY Right 01/20/2017   Procedure: RIGHT HAND IRRIGATION AND DEBRIDEMENT AND REPAIR AS INDICATED;  Surgeon: Iran Planas, MD;  Location: [place];  Service: Orthopedics;  Laterality: Right;   OOPHORECTOMY Right    TUBAL LIGATION     UPPER GASTROINTESTINAL ENDOSCOPY      FHx:    Reviewed / unchanged  SHx:    Reviewed / unchanged   Systems Review:  Constitutional: Denies fever, chills, wt changes, headaches, insomnia, fatigue, night sweats, change in appetite. Eyes: Denies redness, blurred vision, diplopia, discharge, itchy, watery eyes.  ENT: Denies discharge, congestion, post nasal drip, epistaxis, sore throat, earache, hearing loss, dental pain, tinnitus, vertigo, sinus pain, snoring.  CV: Denies chest pain, palpitations, irregular heartbeat, syncope, dyspnea, diaphoresis, orthopnea, PND, claudication or edema. Respiratory: denies cough, dyspnea, DOE, pleurisy, hoarseness, laryngitis, wheezing.  Gastrointestinal: Denies dysphagia, odynophagia, heartburn, reflux, water brash, abdominal pain or cramps, nausea, vomiting, bloating, diarrhea, constipation, hematemesis, melena, hematochezia  or hemorrhoids. Genitourinary: Denies dysuria, frequency, urgency, nocturia, hesitancy, discharge, hematuria or flank  pain. Musculoskeletal: Denies arthralgias, myalgias, stiffness, jt. swelling, pain, limping or strain/sprain.  Skin: Denies pruritus, rash, hives, warts, acne, eczema or change in skin lesion(s). Neuro: No weakness, tremor, incoordination, spasms, paresthesia or pain. Psychiatric: Denies confusion, memory loss or sensory loss. Endo: Denies change in weight, skin or hair change.  Heme/Lymph: No excessive bleeding, bruising or enlarged lymph nodes.  Physical Exam  There  were no vitals taken for this visit.  Appears  well nourished, well groomed  and in no distress.  Eyes: PERRLA, EOMs, conjunctiva no swelling or erythema. Sinuses: No frontal/maxillary tenderness ENT/Mouth: EAC's clear, TM's nl w/o erythema, bulging. Nares clear w/o erythema, swelling, exudates. Oropharynx clear without erythema or exudates. Oral hygiene is good. Tongue normal, non obstructing. Hearing intact.  Neck: Supple. Thyroid not palpable. Car 2+/2+ without bruits, nodes or JVD. Chest: Respirations nl with BS clear & equal w/o rales, rhonchi, wheezing or stridor.  Cor: Heart sounds normal w/ regular rate and rhythm without sig. murmurs, gallops, clicks or rubs. Peripheral pulses normal and equal  without edema.  Abdomen: Soft & bowel sounds normal. Non-tender w/o guarding, rebound, hernias, masses or organomegaly.  Lymphatics: Unremarkable.  Musculoskeletal: Full ROM all peripheral extremities, joint stability, 5/5 strength and normal gait.  Skin: Warm, dry without exposed rashes, lesions or ecchymosis apparent.  Neuro: Cranial nerves intact, reflexes equal bilaterally. Sensory-motor testing grossly intact. Tendon reflexes grossly intact.  Pysch: Alert & oriented x 3.  Insight and judgement nl & appropriate. No ideations.  Assessment and Plan:  1. Essential hypertension  - Continue medication, monitor blood pressure at home.  - Continue DASH diet.  Reminder to go to the ER if any CP,  SOB, nausea, dizziness,  severe HA, changes vision/speech.  - Magnesium - TSH  2. Hyperlipidemia, mixed  - Continue diet/meds, exercise,& lifestyle modifications.  - Continue monitor periodic cholesterol/liver & renal functions   - Lipid panel - TSH  3. Abnormal glucose  - Continue diet, exercise  - Lifestyle modifications.  - Monitor appropriate labs.  - Hemoglobin A1c - Insulin, random  4. Vitamin D deficiency  - Continue supplementation.  - VITAMIN D 25 Hydroxy  5. Medication management  - Magnesium - Lipid panel - TSH - Hemoglobin A1c - Insulin, random - VITAMIN D 25 Hydroxy         Discussed  regular exercise, BP monitoring, weight control to achieve/maintain BMI less than 25 and discussed med and SE's. Recommended labs to assess and monitor clinical status with further disposition pending results of labs.  I discussed the assessment and treatment plan with the patient. The patient was provided an opportunity to ask questions and all were answered. The patient agreed with the plan and demonstrated an understanding of the instructions.  I provided over 30 minutes of exam, counseling, chart review and  complex critical decision making.   Kirtland Bouchard, MD

## 2022-02-22 ENCOUNTER — Ambulatory Visit (INDEPENDENT_AMBULATORY_CARE_PROVIDER_SITE_OTHER): Payer: Medicare Other | Admitting: Internal Medicine

## 2022-02-22 ENCOUNTER — Encounter: Payer: Self-pay | Admitting: Internal Medicine

## 2022-02-22 VITALS — BP 138/90 | HR 68 | Temp 97.7°F | Ht 63.0 in | Wt 121.0 lb

## 2022-02-22 DIAGNOSIS — I10 Essential (primary) hypertension: Secondary | ICD-10-CM | POA: Diagnosis not present

## 2022-02-22 DIAGNOSIS — R7309 Other abnormal glucose: Secondary | ICD-10-CM | POA: Diagnosis not present

## 2022-02-22 DIAGNOSIS — Z79899 Other long term (current) drug therapy: Secondary | ICD-10-CM

## 2022-02-22 DIAGNOSIS — E559 Vitamin D deficiency, unspecified: Secondary | ICD-10-CM

## 2022-02-22 DIAGNOSIS — E782 Mixed hyperlipidemia: Secondary | ICD-10-CM

## 2022-02-22 MED ORDER — TORSEMIDE 20 MG PO TABS: ORAL_TABLET | ORAL | 3 refills | Status: DC

## 2022-02-22 NOTE — Addendum Note (Signed)
Addended by: Unk Pinto on: 02/22/2022 10:34 AM   Modules accepted: Orders

## 2022-02-22 NOTE — Patient Instructions (Signed)

## 2022-02-23 ENCOUNTER — Other Ambulatory Visit: Payer: Self-pay | Admitting: Internal Medicine

## 2022-02-23 ENCOUNTER — Telehealth: Payer: Self-pay

## 2022-02-23 ENCOUNTER — Other Ambulatory Visit: Payer: Self-pay | Admitting: Nurse Practitioner

## 2022-02-23 DIAGNOSIS — M25562 Pain in left knee: Secondary | ICD-10-CM | POA: Diagnosis not present

## 2022-02-23 DIAGNOSIS — E782 Mixed hyperlipidemia: Secondary | ICD-10-CM

## 2022-02-23 DIAGNOSIS — M25511 Pain in right shoulder: Secondary | ICD-10-CM | POA: Diagnosis not present

## 2022-02-23 DIAGNOSIS — M25561 Pain in right knee: Secondary | ICD-10-CM | POA: Diagnosis not present

## 2022-02-23 LAB — CBC WITH DIFFERENTIAL/PLATELET
Absolute Monocytes: 455 cells/uL (ref 200–950)
Basophils Absolute: 32 cells/uL (ref 0–200)
Basophils Relative: 0.7 %
Eosinophils Absolute: 113 cells/uL (ref 15–500)
Eosinophils Relative: 2.5 %
HCT: 38.7 % (ref 35.0–45.0)
Hemoglobin: 12.7 g/dL (ref 11.7–15.5)
Lymphs Abs: 1220 cells/uL (ref 850–3900)
MCH: 30.5 pg (ref 27.0–33.0)
MCHC: 32.8 g/dL (ref 32.0–36.0)
MCV: 92.8 fL (ref 80.0–100.0)
MPV: 9.9 fL (ref 7.5–12.5)
Monocytes Relative: 10.1 %
Neutro Abs: 2682 cells/uL (ref 1500–7800)
Neutrophils Relative %: 59.6 %
Platelets: 219 10*3/uL (ref 140–400)
RBC: 4.17 10*6/uL (ref 3.80–5.10)
RDW: 12.6 % (ref 11.0–15.0)
Total Lymphocyte: 27.1 %
WBC: 4.5 10*3/uL (ref 3.8–10.8)

## 2022-02-23 LAB — TSH: TSH: 2.59 mIU/L (ref 0.40–4.50)

## 2022-02-23 LAB — COMPLETE METABOLIC PANEL WITH GFR
AG Ratio: 1.9 (calc) (ref 1.0–2.5)
ALT: 15 U/L (ref 6–29)
AST: 23 U/L (ref 10–35)
Albumin: 3.8 g/dL (ref 3.6–5.1)
Alkaline phosphatase (APISO): 69 U/L (ref 37–153)
BUN: 22 mg/dL (ref 7–25)
CO2: 24 mmol/L (ref 20–32)
Calcium: 9.5 mg/dL (ref 8.6–10.4)
Chloride: 110 mmol/L (ref 98–110)
Creat: 0.9 mg/dL (ref 0.60–1.00)
Globulin: 2 g/dL (calc) (ref 1.9–3.7)
Glucose, Bld: 73 mg/dL (ref 65–99)
Potassium: 3.5 mmol/L (ref 3.5–5.3)
Sodium: 144 mmol/L (ref 135–146)
Total Bilirubin: 0.4 mg/dL (ref 0.2–1.2)
Total Protein: 5.8 g/dL — ABNORMAL LOW (ref 6.1–8.1)
eGFR: 68 mL/min/{1.73_m2} (ref >=60)

## 2022-02-23 LAB — LIPID PANEL
Cholesterol: 170 mg/dL (ref <200)
HDL: 57 mg/dL (ref >=50)
LDL Cholesterol (Calc): 94 mg/dL (calc)
Non-HDL Cholesterol (Calc): 113 mg/dL (calc) (ref <130)
Total CHOL/HDL Ratio: 3 (calc) (ref <5.0)
Triglycerides: 96 mg/dL (ref <150)

## 2022-02-23 LAB — MAGNESIUM: Magnesium: 2.1 mg/dL (ref 1.5–2.5)

## 2022-02-23 LAB — INSULIN, RANDOM: Insulin: 4.6 u[IU]/mL

## 2022-02-23 LAB — VITAMIN D 25 HYDROXY (VIT D DEFICIENCY, FRACTURES): Vit D, 25-Hydroxy: 74 ng/mL (ref 30–100)

## 2022-02-23 LAB — HEMOGLOBIN A1C
Hgb A1c MFr Bld: 5.2 % of total Hgb (ref <5.7)
Mean Plasma Glucose: 103 mg/dL
eAG (mmol/L): 5.7 mmol/L

## 2022-02-23 MED ORDER — ROSUVASTATIN CALCIUM 20 MG PO TABS: ORAL_TABLET | ORAL | 3 refills | Status: DC

## 2022-02-23 NOTE — Telephone Encounter (Signed)
LM-02/23/22-Calling pt. To confirm CP initial visit for 6/27 at 9:30AM for office visit. Unable to reach pt. Call was dropped. Will try calling patient at another time.  Total time spent: 2 min.

## 2022-02-24 NOTE — Telephone Encounter (Signed)
LM-02/24/22-Calling pt to confirm cp visit for 6/27 at 9:30AM. Unable to reach X2. Will try calling pt. At another time.  Total time spent: 2 min

## 2022-02-28 ENCOUNTER — Ambulatory Visit (INDEPENDENT_AMBULATORY_CARE_PROVIDER_SITE_OTHER): Payer: Medicare Other | Admitting: Nurse Practitioner

## 2022-02-28 ENCOUNTER — Ambulatory Visit
Admission: RE | Admit: 2022-02-28 | Discharge: 2022-02-28 | Disposition: A | Discharge status: Home / Self Care | Payer: Medicare Other | Source: Ambulatory Visit | Attending: Nurse Practitioner | Admitting: Nurse Practitioner

## 2022-02-28 ENCOUNTER — Encounter: Payer: Self-pay | Admitting: Nurse Practitioner

## 2022-02-28 ENCOUNTER — Ambulatory Visit: Payer: Medicare Other | Admitting: Pharmacy Technician

## 2022-02-28 ENCOUNTER — Ambulatory Visit
Admission: RE | Admit: 2022-02-28 | Discharge: 2022-02-28 | Disposition: A | Discharge status: Home / Self Care | Payer: Medicare Other | Source: Ambulatory Visit | Attending: Adult Health | Admitting: Adult Health

## 2022-02-28 VITALS — BP 129/77 | HR 74 | Temp 97.9°F | Resp 16 | Wt 115.6 lb

## 2022-02-28 DIAGNOSIS — M25561 Pain in right knee: Secondary | ICD-10-CM | POA: Diagnosis not present

## 2022-02-28 DIAGNOSIS — R600 Localized edema: Secondary | ICD-10-CM

## 2022-02-28 DIAGNOSIS — M25531 Pain in right wrist: Secondary | ICD-10-CM | POA: Diagnosis not present

## 2022-02-28 DIAGNOSIS — W19XXXA Unspecified fall, initial encounter: Secondary | ICD-10-CM

## 2022-02-28 DIAGNOSIS — M25511 Pain in right shoulder: Secondary | ICD-10-CM

## 2022-03-02 ENCOUNTER — Encounter: Payer: Self-pay | Admitting: Internal Medicine

## 2022-03-02 DIAGNOSIS — M25561 Pain in right knee: Secondary | ICD-10-CM | POA: Diagnosis not present

## 2022-03-02 DIAGNOSIS — M25511 Pain in right shoulder: Secondary | ICD-10-CM | POA: Diagnosis not present

## 2022-03-02 DIAGNOSIS — N182 Chronic kidney disease, stage 2 (mild): Secondary | ICD-10-CM | POA: Insufficient documentation

## 2022-03-02 NOTE — Progress Notes (Signed)
Assessment and Plan:  Latoya [name] was seen today for an episodic visit.  Diagnoses and all order for this visit:  1. Accidental fall, initial encounter Continue to pay close attention to surroundings.  - DG Wrist Complete Right; Future - DG Hand Complete Right; Future - DG Knee Complete 4 Views Right; Future  2. Right wrist pain RICE Method. OTC analgesic as directed PRN  - DG Wrist Complete Right; Future - DG Hand Complete Right; Future  3. Acute pain of right knee RICE Method. OTC analgesic as directed PRN  - DG Knee Complete 4 Views Right; Future  4. Localized edema RICE Method.  - DG Wrist Complete Right; Future - DG Hand Complete Right; Future - DG Knee Complete 4 Views Right; Future   Notify office for further evaluation and treatment, questions or concerns if s/s fail to improve. The risks and benefits of my recommendations, as well as other treatment options were discussed with the patient today. Questions were answered.  Further disposition pending results of labs. Discussed med's effects and SE's.    Over 20 minutes of exam, counseling, chart review, and critical decision making was performed.   Future Appointments  Date Time Provider Latoya [name]  05/19/2022  2:40 PM [name], MD [facility] None  05/30/2022 11:00 AM Alycia Rossetti, NP [facility] None  11/15/2022 10:00 AM Unk Pinto, MD [facility] None    ------------------------------------------------------------------------------------------------------------------   HPI BP 129/77   Pulse 74   Temp 97.9 F (36.6 C)   Resp 16   Wt 115 lb 9.6 oz (52.4 kg)   SpO2 99%   BMI 20.48 kg/m   71 y.o.female presents for evaluation of fall that occurred yesterday when walking her dog.  She became tangled in the leash and tripped and fell on her right side catching herself with her right hand and right knee.  She complains today of right knee pain with swelling, along with right  hand pain with swelling and bruising.  She also has several scattered abrasions along her LLE and RLE.  She has been placing abx cream over these areas.    Past Medical History:  Diagnosis Date   Anxiety and depression    Arthritis    Colon polyps    GERD (gastroesophageal reflux disease)    Hemorrhoids    Hiatal hernia    History of kidney stones    passed   Hyperlipemia    Mixed hyperlipidemia 11/26/2013   Panic disorder    Passive smoke exposure 07/17/2018   Renal insufficiency    stage 3 kidney disease per her PCP   Stomach ulcer    Unspecified essential hypertension 11/26/2013     Allergies  Allergen Reactions   Other     All pain medications: Unknown/ CODEINE   Prednisone Other (See Comments)    DYSPHORIA   Vioxx [Rofecoxib]     unknown   Entex T [Pseudoephedrine-Guaifenesin] Palpitations   Levaquin [Levofloxacin] Rash    Tolerated Avelox without adverse effect 11/08/15.   Sulfa Antibiotics Rash   Trovan [Alatrofloxacin] Rash    Current Outpatient Medications on File Prior to Visit  Medication Sig   alendronate (FOSAMAX) 70 MG tablet Take 1 tablet (70 mg total) by mouth once a week. Take on empty stomach with full glass of water and avoid reclining for 30 min after taking med.   ALPRAZolam (XANAX) 0.5 MG tablet Take 1/2  to 1 tablet 2 to 3 x /day as needed for Nerves  Ascorbic Acid (VITAMIN C) 1000 MG tablet Take 1,000 mg by mouth daily.   aspirin 81 MG tablet Take 81 mg by mouth at bedtime.    cholecalciferol (VITAMIN D) 1000 units tablet Take 10,000 Units by mouth daily.   diltiazem (CARDIZEM) 120 MG tablet Take  1 tablet  2 x /day (every 12 hours)  for BP                                      /                 TAKE                           BY                     MOUTH   famotidine (PEPCID) 20 MG tablet 20 mg daily. As needed.   meclizine (ANTIVERT) 25 MG tablet 1/2-1 pill up to 3 times daily for motion sickness/dizziness   potassium chloride SA (KLOR-CON) 20 MEQ  tablet TAKE 1 TABLET BY MOUTH TWICE DAILY FOR POTASSIUM   rosuvastatin (CRESTOR) 20 MG tablet Take  1 tablet  Daily  for Cholesterol   sertraline (ZOLOFT) 100 MG tablet Take  1 tablet  Daily for Mood                                              /                 TAKE                         BY                        MOUTH   torsemide (DEMADEX) 20 MG tablet Take  1 tablet every morning for Fluid Retention & Leg Swelling   triamcinolone cream (KENALOG) 0.5 % APPLY TOPICALLY TO THE AFFECTED AREA TWICE DAILY AS NEEDED FOR ITCHING   vitamin B-12 (CYANOCOBALAMIN) 250 MCG tablet Take 250 mcg by mouth daily.   No current facility-administered medications on file prior to visit.    ROS: all negative except what is noted in the HPI.    Physical Exam:  BP 129/77   Pulse 74   Temp 97.9 F (36.6 C)   Resp 16   Wt 115 lb 9.6 oz (52.4 kg)   SpO2 99%   BMI 20.48 kg/m   General Appearance: NAD.  Awake, conversant and cooperative. Eyes: PERRLA, EOMs intact.  Sclera white.  Conjunctiva without erythema. Sinuses: No frontal/maxillary tenderness.  No nasal discharge. Nares patent.  ENT/Mouth: Ext aud canals clear.  Bilateral TMs w/DOL and without erythema or bulging. Hearing intact.  Posterior pharynx without swelling or exudate.  Tonsils without swelling or erythema.  Neck: Supple.  No masses, nodules or thyromegaly. Respiratory: Effort is regular with non-labored breathing. Breath sounds are equal bilaterally without rales, rhonchi, wheezing or stridor.  Cardio: RRR with no MRGs. Brisk peripheral pulses without edema.  Abdomen: Active BS in all four quadrants.  Soft and non-tender without guarding, rebound tenderness, hernias or masses. Lymphatics: Non tender without lymphadenopathy.  Musculoskeletal: Right hand  with moderate edema, erythema, ecchymosis.  Pain with inversion and eversion  Right knee with mild edema. Pain with flexion and extension. Favors right leg when walking. Skin: BLE with  scattered open superficial abrasions.  Surrounding skin WNL. Neuro: CN II-XII grossly normal. Normal muscle tone without cerebellar symptoms and intact sensation.   Psych: AO X 3,  appropriate mood and affect, insight and judgment.     Darrol Jump, NP 11:33 PM [facility]

## 2022-03-02 NOTE — Progress Notes (Signed)
Initial Pharmacist Visit (v 2.7.6)  Orser, Lori J 65 years, Female  DOB: Oct 24, 1950  M: [phone] __________________________________________________ Chronic Conditions Patient's Chronic Conditions: Hypertension (HTN), Osteopenia or Osteoporosis, Chronic Kidney Disease (CKD), Hyperlipidemia/Dyslipidemia (HLD), Anxiety, Allergic rhinitis, Diabetes (DM), Prediabetes, Vitamin D deficiency, Hiatal hernia, Chronic Rhinitis, Laryngopharygneal reflux, TB latent, Esophageal reflux, Multiple thyroid nodules, Recurrent acute serous otitis media of right ear, Eustachian tube dysfunction, Multiple lentigines syndrome, GAD, Chronic cough, Tenderness of both temporomandibular joints, thyroid disease  Summary for PCP:  1. Please capture CKD stage 2 2. LDL still above goal. PCP consider Crestor '40mg'$  daily at next AWV. 3. Patient had fall yesterday due to her dog and saw [place] today. Xrays ordered. 4. Needs close follow up due to non-compliance but has been a longstanding issue. Disease Assessments  Current BP: 138/90 Current HR: 68 taken on: 02/22/2022 Previous BP: 130/82 Previous HR: 83 taken on: 02/14/2022 Weight: 121 BMI: 21.43 Last GFR: 68 taken on: 02/22/2022 Visit Completed on: 02/28/2022 Did patient bring medications to appointment?: No What source (s) was used to reconcile medications this visit?: EMR list, Verbal recall from patient/caregiver Why did the patient present?: CCM Initial Marital status?: Widowed Details: Husband passed in 2019 What does the patient do during the day?: Likes to garden, spend time with dog Who does the patient spend their time with and what do they do?: Daughter Arbie Cookey with special needs lives with patient. Lifestyle habits such as diet and exercise?: Walks dog around neighborhood a few times per week. No formal diet Alcohol, tobacco, and illicit drug usage?: None What is the patient's sleep pattern?: No sleep issues How many hours per night does patient  typically sleep?: 6-8 Patient pleased with health care they are receiving?: Yes Factors that may affect medication adherence?: Pill burden Name and location of Current pharmacy: [facility] Current Rx insurance plan: [employer] Are meds synced by current pharmacy?: No Are meds delivered by current pharmacy?: No - delivery not available Would patient benefit from direct intervention of clinical lead in dispensing process to optimize clinical outcomes?: No Are [facility] pharmacy services available where patient lives?: Yes Is patient disadvantaged to use [facility]?: Yes Does patient experience delays in picking up medications due to transportation concerns (getting to pharmacy)?: No Any additional demeanor/mood notes?: Sweet lady presenting in office today for CCM initial visit. She did not bring medications into office today. We reviewed via EMR and patient recall. She has no copay barriers and uses a pill box for organization. She did have a fall yesterday and looks like an injury to her wrist and knee due to her dog knocking her down. PCP visit today as well for acute fall and X-rays. Overall she states she feels good.  SDOH: Accountable Health Communities Health-Related Social Needs Screening Tool ([email or web address]) SDOH questions were documented and reviewed (EMR or [employer]) within the past 3 months?: No What is your living situation today? (ref #1): I have a steady place to live Think about the place you live. Do you have problems with any of the following? (ref #2): None of the above Within the past 12 months, you worried that your food would run out before you got money to buy more (ref #3): Never true Within the past 12 months, the food you bought just didn't last and you didn't have money to get more (ref #4): Never true In the past 12 months, has lack of reliable transportation kept you from medical appointments, meetings, work or  from getting things needed  for daily living? (ref #5): No In the past 12 months, has the electric, gas, oil, or water company threatened to shut off services in your home? (ref #6): No How often does anyone, including family and friends, physically hurt you? (ref #7): Never (1) How often does anyone, including family and friends, insult or talk down to you? (ref #8): Never (1) How often does anyone, including friends and family, threaten you with harm? (ref #9): Never (1) How often does anyone, including family and friends, scream or curse at you? (ref #10): Never (1)  Hypertension (HTN) Discussed with patient today?: Yes Is patient able to obtain BP reading today?: Yes BP today is: 135/83 Heart Rate is: 71 Goal: <130/80 mmHG Hypertension Stage: Stage 1 (SBP: 130-139 or DBP: 80-89) Is Patient checking BP at home?: No How often does patient miss taking their blood pressure medications?: None Has patient experienced hypotension, dizziness, falls or bradycardia?: Yes Provide Details: Fall due to dog knocking her down Check present secondary causes (below) for HTN: CKD BP RPM device: Does patient qualify?: No We discussed: DASH diet:  following a diet emphasizing fruits and vegetables and low-fat dairy products along with whole grains, fish, poultry, and nuts. Reducing red meats and sugars., Reducing the amount of salt intake to '1500mg'$ /per day., Getting enough potassium in your diet equaling 3500-'5000mg'$ /day.  This helps to regulate BP by balancing out the effects of salt., Weight reduction- We discussed losing 5-10% of body weight., Proper Home BP Measurement, Hypertension pathophysiology, complications, treatment goals, and management with patient for 10-15 minutes, Increasing movement, Increasing exercise (walking, biking, swimming) to a goal of 30 minutes per day, as able based on current activity level and health or as directed by your healthcare provider., Contacting PCP office for signs and  symptoms of high or low blood pressure (hypotension, dizziness, falls, headaches, edema) Assessment:: Uncontrolled Drug: Diltiazem '120mg'$  BID Assessment: Appropriate, Query Effectiveness Additional Info: BP elevated today possibly due to pain. Plan to Review: CMP Plan to Counsel: Counseled on home BP monitoring, increasing water intake HC Follow up: HTN assessment in 1 month  Hyperlipidemia/Dyslipidemia (HLD) Last Lipid panel on: 02/22/2022 TC (Goal<200): 170 LDL: 94 HDL (Goal>40): 57 TG (Goal<150): 96 ASCVD 10-year risk?is:: Intermediate (7.5%-20%) ASCVD Risk Score: 15.4% Discussed with patient today?: Yes LDL Goal: <70 Has patient tried and failed any HLD Medications?: No We discussed: Complications of hyperlipidemia and prevention methods with patient for 5-10 minutes, Increasing exercise (walking, biking, swimming) to a goal of 30 minutes per day, as able based on current activity level and health or as directed by your healthcare provider, How a diet high in fruits/vegetables/nuts/whole grains/beans may help to reduce your cholesterol. Increasing soluble fiber intake.  Avoiding sugary foods and trans fat, limiting carbohydrates, and reducing portion sizes. Recommended increasing intake of healthy fats into their diet, Weight reduction- We discussed losing 5-10% of body weight Assessment:: Uncontrolled Drug: Crestor '20mg'$  daily Assessment: Appropriate, Query Effectiveness Plan to Modify: PCP consider Crestor '40mg'$  Plan to Review: Lipid panel Plan to Counsel: Counseled on increasing fruits/vegetables, increasing moderate intensity exercise Pharmacist Follow up: 6 months  Diabetes (DM) Current A1C: 5.2 taken on: 02/22/2022 Previous A1C: 5.2 taken on: 02/22/2022 Type: Pre-Diabetes/Impaired Fasting Glucose Discussed with patient today?: Yes Goal A1C: < 6.5 % Type: Pre-Diabetes/Impaired Fasting Glucose We discussed: Low carbohydrate eating plan with an emphasis on whole grains,  legumes, nuts, fruits, and vegetables and minimal refined and processed foods., Proper diabetic foot care including daily foot exams and wearing  closed toe shoes., Healthy eating habits for DM with patient for 44-03 minutes, Complications of Type 2 DM and preventative measures with patient for 5-10 minutes, Avoiding both sugar-sweetened and artificially (aspartame) sweetened beverages and increase water intake., Increasing exercise (walking, biking, swimming) to a goal of 30 minutes per day, as able based on current activity level and health, Scheduling a yearly eye exam, Scheduling a dental appointment twice yearly Assessment:: Controlled Drug: None Assessment: Query Appropriateness Plan to Review: A1c Pharmacist Follow up: Improved  Anxiety Previous GAD-7 Score: Unknown Discussed with patient today?: Yes Completing the GAD-7 Questionnaire today?: No In your opinion, how do you feel your anxiety symptoms have been controlled over the past 3 months?: Improved Have you ever had a panic attack?: No We discussed: Anxiety management techniques (e.g., relaxation, deep breathing, positive visualization, reassuring self-statements)., Proper use of medications to treat anxiety and the expected timeline of effects, side effects and to report back if symptoms persist despite medical treatment, Keeping a log of episodes of anxiety including precipitating factors and management techniques Assessment:: Controlled Drug: Sertraline '100mg'$  daily Assessment: Appropriate, Effective, Safe, Accessible Drug: Xanax 0.'5mg'$  1/2-1 tab BID-TID as needed Assessment: Appropriate, Effective, Safe, Accessible Additional Info: Patient rarely takes xanax unless she has trouble sleeping. Does not take daily HC Follow up: GAD-7 in 1 month  Chronic kidney disease (CKD) Previous GFR: 67 taken on: 01/27/2022 The current microalbumin ratio is: 20 tested on: 11/21/2021 Discussed with patient today?: Yes CKD Stage: Stage 2 (GFR  61-90 ml/min) Albuminuria Stage: A1 (<30) Contributing factors for developing CKD: HTN Is Patient taking statin medication: Yes Is patient taking ACEi / ARB?: No Renal dose adjustments recommended?: No We discussed: Limiting dietary sodium intake to less than 2000 mg / day, Maintaining blood pressure control, Maintaining blood glucose control, Avoidance of nephrotoxic drugs (NSAIDs) Assessment:: Controlled Plan to Review: GFR, SCR Plan to Counsel: Increase water intake, Avoid NSAIDs Pharmacist Follow up: Stable  Osteopenia or Osteoporosis Current T-score: -2.7 taken on: 06/23/2019 Previous T-score: Unknown Current Vitamin D 25-OH: 74 taken on: 02/22/2022 Previous Vitamin D 25-OH: 65 taken on: 11/09/2021 Discussed with patient today?: Yes Patient has: Osteoporosis In the past 12 months, have you fallen?: Yes What were the circumstances?: Dog knocked her down yesterday Are there any stairs in or around the home?: Yes Are there any stairs with handrails?: Yes Is the home free of loose throw rugs in walkways, pet beds, electrical cords, etc.?: Yes Is there adequate lighting in your home to reduce the risk of falls?: Yes Bisphosphonate start: 06/2021? Drug holiday dates: None We discussed: Weight bearing exercises (walking, light weights, resistance training), Dietary calcium intake, Fall prevention, Proper administration of bisphosphonate, Vitamin D supplementation Drug: Fosamax '70mg'$  weekly Assessment: Appropriate, Query Effectiveness Additional Info: Patient has a history of non-compliance Plan to Counsel: Counseled on proper administration. Plan to (other): DEXA in 2 years  Exercise, Diet and Non-Drug Coordination Needs Additional exercise counseling points. We discussed: incorporating flexibility, balance, and strength training exercises, decreasing sedentary behavior, targeting at least 150 minutes per week of moderate-intensity aerobic exercise. Additional diet counseling  points. We discussed: key components of the DASH diet, key components of a low-carb eating plan, aiming to consume at least 8 cups of water day Discussed Non-Drug Care Coordination Needs: Yes Does Patient have Medication financial barriers?: No  Engagement Notes Marda Stalker on 02/28/2022 03:50 PM CPP Prep: 11mn CPP OV: 454m CPP Doc: [id number] Care Plan: [id number] Summary Patient Risk: Moderate Next  CCM Follow Up: 6 months Next AWV: 05/30/22 Attestation Statement:: CCM Services:  This encounter meets complex CCM services and moderate to high medical decision making.  Prior to outreach and patient consent for Chronic Care Management, I referred this patient for services after reviewing the nominated patient list or from a personal encounter with the patient.  I have personally reviewed this encounter including the documentation in this note and have collaborated with the care management provider regarding care management and care coordination activities to include development and update of the comprehensive care plan I am certifying that I agree with the content of this note and encounter as supervising physician.  Marda Stalker, PharmD Clinical Pharmacist [email or web address] [phone]

## 2022-03-03 DIAGNOSIS — N183 Chronic kidney disease, stage 3 unspecified: Secondary | ICD-10-CM | POA: Diagnosis not present

## 2022-03-03 DIAGNOSIS — I10 Essential (primary) hypertension: Secondary | ICD-10-CM | POA: Diagnosis not present

## 2022-03-03 DIAGNOSIS — E782 Mixed hyperlipidemia: Secondary | ICD-10-CM | POA: Diagnosis not present

## 2022-04-15 ENCOUNTER — Other Ambulatory Visit: Payer: Self-pay | Admitting: Nurse Practitioner

## 2022-04-15 DIAGNOSIS — R600 Localized edema: Secondary | ICD-10-CM

## 2022-04-20 DIAGNOSIS — M25561 Pain in right knee: Secondary | ICD-10-CM | POA: Diagnosis not present

## 2022-04-20 DIAGNOSIS — M25511 Pain in right shoulder: Secondary | ICD-10-CM | POA: Diagnosis not present

## 2022-04-23 IMAGING — MG DIGITAL SCREENING BILAT W/ TOMO W/ CAD
8 series · 8 of 24 positions shown · non-contrast
Comparison: Previous exam(s).

CLINICAL DATA: Screening.

EXAM:
DIGITAL SCREENING BILATERAL MAMMOGRAM WITH TOMO AND CAD

[L MLO synth-2D]
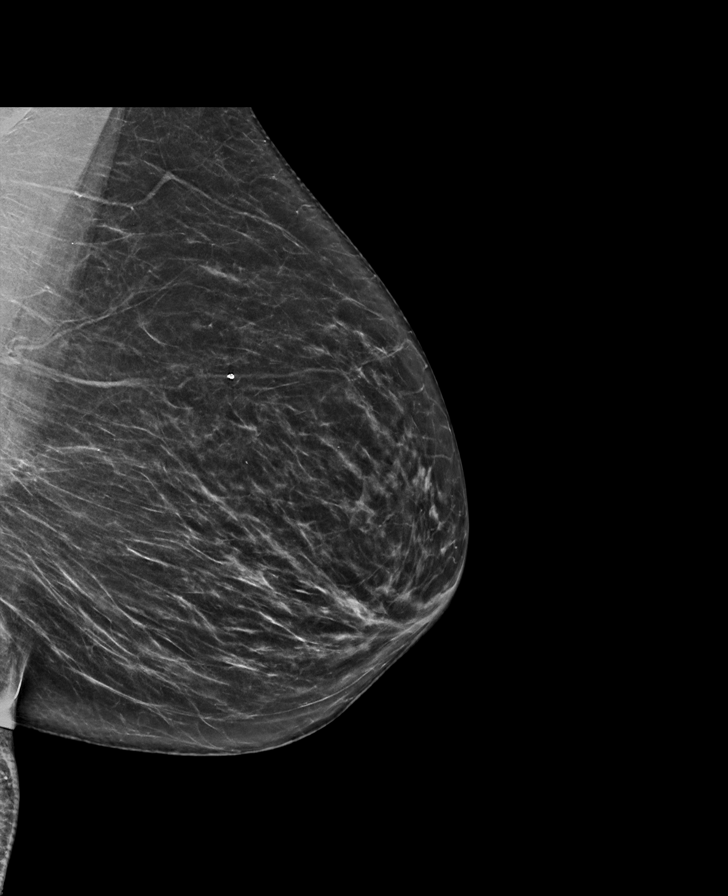

[R MLO synth-2D]
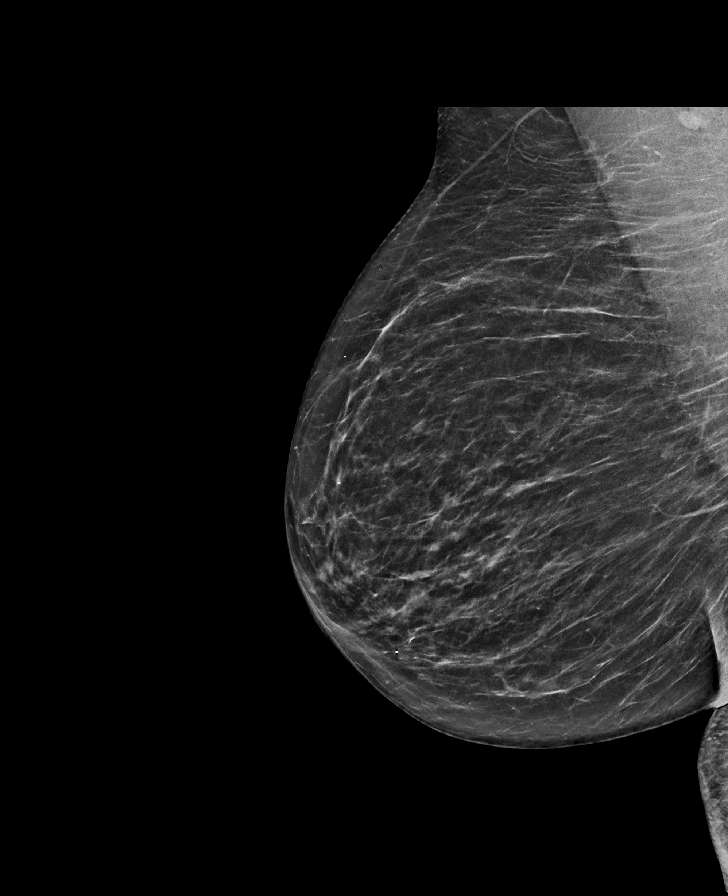

[L CC synth-2D]
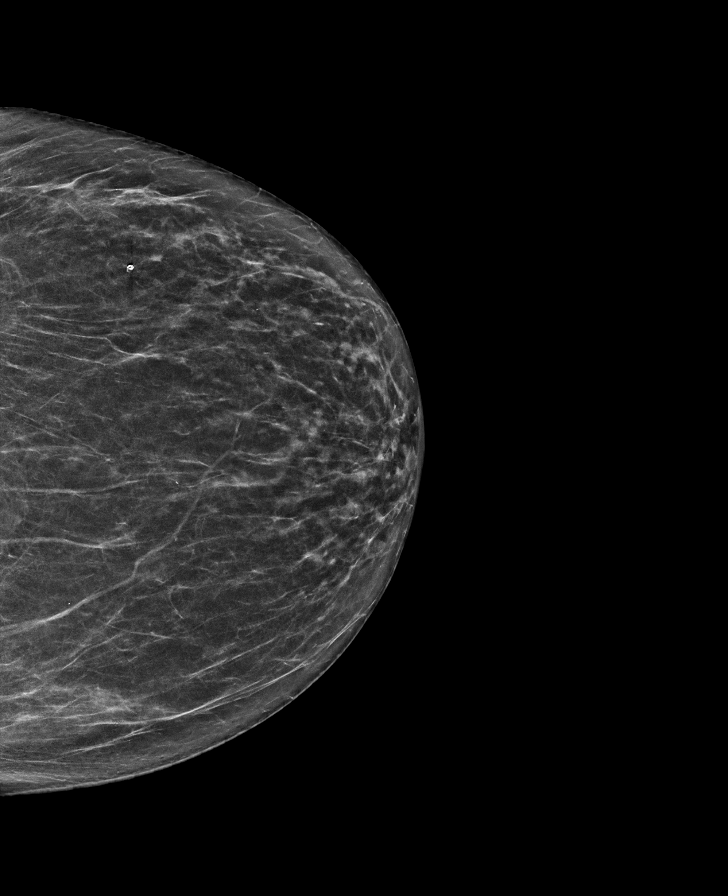

[R CC synth-2D]
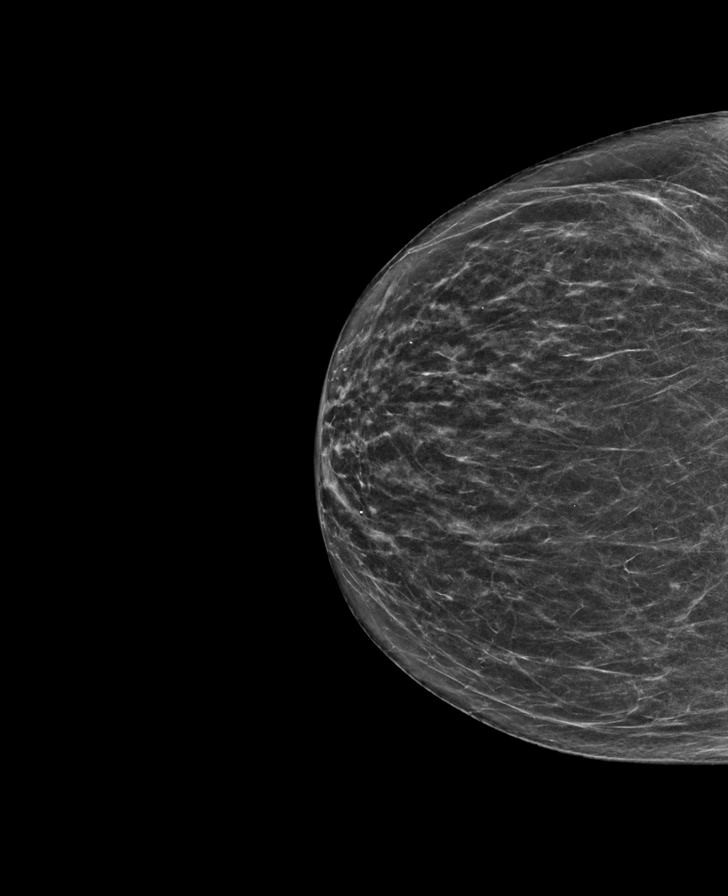

[L CC tomo · tomo slice 29/58.0]
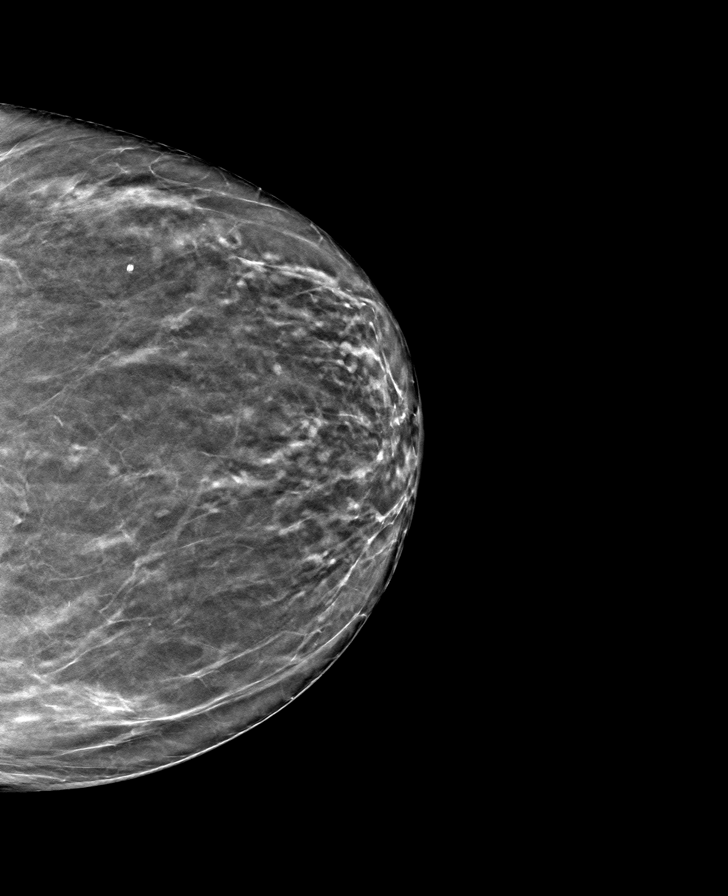

[L MLO tomo · tomo slice 33/65.0]
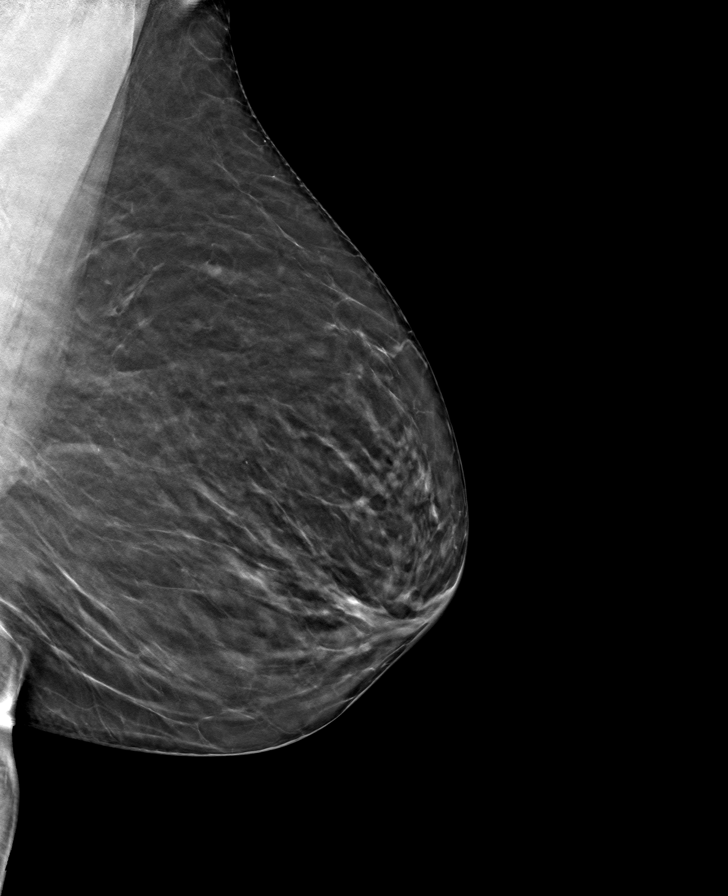

[R CC tomo · tomo slice 29/57.0]
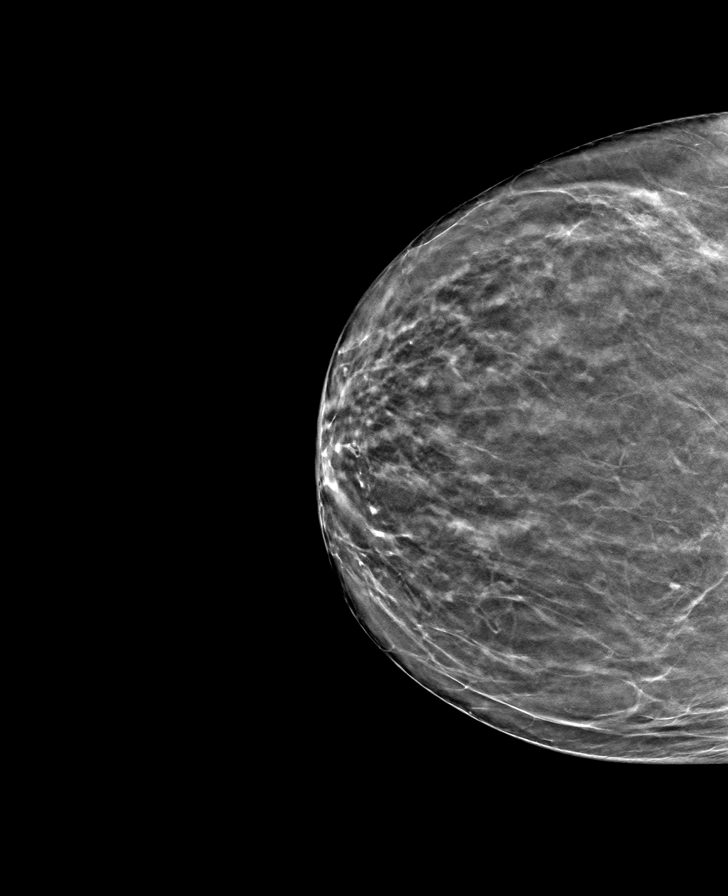

[R MLO tomo · tomo slice 33/64.0]
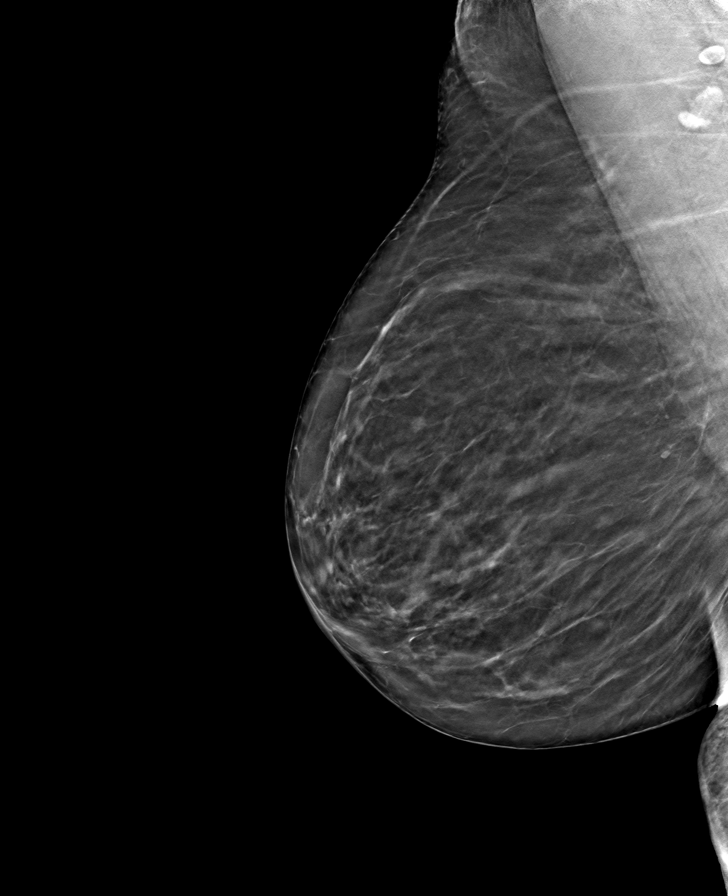

[8 of 24 positions shown; findings below may reference images not displayed]

ACR Breast Density Category b: There are scattered areas of
fibroglandular density.
FINDINGS: There are no findings suspicious for malignancy. Images were
processed with CAD.
IMPRESSION: No mammographic evidence of malignancy. A result letter of this
screening mammogram will be mailed directly to the patient.

RECOMMENDATION:
Screening mammogram in one year. (Code:[id number])

BI-RADS CATEGORY  1: Negative.

## 2022-05-15 ENCOUNTER — Ambulatory Visit (INDEPENDENT_AMBULATORY_CARE_PROVIDER_SITE_OTHER): Payer: Medicare Other | Admitting: Nurse Practitioner

## 2022-05-15 ENCOUNTER — Encounter: Payer: Self-pay | Admitting: Nurse Practitioner

## 2022-05-15 VITALS — BP 140/74 | HR 90 | Temp 97.7°F | Ht 63.0 in | Wt 113.4 lb

## 2022-05-15 DIAGNOSIS — N182 Chronic kidney disease, stage 2 (mild): Secondary | ICD-10-CM | POA: Diagnosis not present

## 2022-05-15 DIAGNOSIS — I10 Essential (primary) hypertension: Secondary | ICD-10-CM

## 2022-05-15 DIAGNOSIS — N3090 Cystitis, unspecified without hematuria: Secondary | ICD-10-CM

## 2022-05-15 MED ORDER — NITROFURANTOIN MONOHYD MACRO 100 MG PO CAPS: 100.0000 mg | ORAL_CAPSULE | Freq: Two times a day (BID) | ORAL | 0 refills | Status: AC

## 2022-05-15 NOTE — Progress Notes (Signed)
Assessment and Plan:  Latoya [name] was seen today for urinary tract infection.  Diagnoses and all orders for this visit:  Cystitis Strongly encouraged to push fluids, at least 48 ounces of water a day and try to work towards getting 64 ounces a day -     nitrofurantoin, macrocrystal-monohydrate, (MACROBID) 100 MG capsule; Take 1 capsule (100 mg total) by mouth 2 (two) times daily for 7 days. -     Urinalysis, Routine w reflex microscopic -     Urine Culture  Essential hypertension - continue medications, DASH diet, exercise and monitor at home. Call if greater than 130/80.  Go to the ER if any chest pain, shortness of breath, nausea, dizziness, severe HA, changes vision/speech  CKD (chronic kidney disease) stage 2, GFR 60-89 ml/min Encouraged to start drinking at lest 48 ounces and work to 64 ounces of water daily Do not use Ibuprofen or aleve      Further disposition pending results of labs. Discussed med's effects and SE's.   Over 30 minutes of exam, counseling, chart review, and critical decision making was performed.   Future Appointments  Date Time Provider [place]  05/19/2022  2:40 PM Latoya Kingdom, MD [facility] None  05/30/2022 11:00 AM Latoya Rossetti, NP [facility] None  11/15/2022 10:00 AM Latoya Pinto, MD [facility] None    ------------------------------------------------------------------------------------------------------------------   HPI BP (!) 140/74   Pulse 90   Temp 97.7 F (36.5 C)   Ht _0  (1.6 m)   Wt 113 lb 6.4 oz (51.4 kg)   SpO2 98%   BMI 20.09 kg/m   71 y.o.female presents for burning on urination and frequency of urination. Denies blood in urine, back pain and fever. Symptoms have been present for 3 days.  She is in Stage 2 CKD.  States she drinks very little throughout the day Lab Results  Component Value Date   EGFR 68 02/22/2022     She is currently on Cardizem 120 mg BID, torsemide 20 mg qd.  Denies headaches, chest  pain, shortness of breath and dizziness BP Readings from Last 3 Encounters:  05/15/22 (!) 140/74  02/28/22 129/77  02/22/22 138/90     Past Medical History:  Diagnosis Date   Anxiety and depression    Arthritis    Colon polyps    GERD (gastroesophageal reflux disease)    Hemorrhoids    Hiatal hernia    History of kidney stones    passed   Hyperlipemia    Mixed hyperlipidemia 11/26/2013   Panic disorder    Passive smoke exposure 07/17/2018   Renal insufficiency    stage 3 kidney disease per her PCP   Stomach ulcer    Unspecified essential hypertension 11/26/2013     Allergies  Allergen Reactions   Other     All pain medications: Unknown/ CODEINE   Prednisone Other (See Comments)    DYSPHORIA   Vioxx [Rofecoxib]     unknown   Entex T [Pseudoephedrine-Guaifenesin] Palpitations   Levaquin [Levofloxacin] Rash    Tolerated Avelox without adverse effect 11/08/15.   Sulfa Antibiotics Rash   Trovan [Alatrofloxacin] Rash    Current Outpatient Medications on File Prior to Visit  Medication Sig   ALPRAZolam (XANAX) 0.5 MG tablet Take 1/2  to 1 tablet 2 to 3 x /day as needed for Nerves   Ascorbic Acid (VITAMIN C) 1000 MG tablet Take 1,000 mg by mouth daily.   aspirin 81 MG tablet Take 81 mg by mouth at bedtime.  cholecalciferol (VITAMIN D) 1000 units tablet Take 10,000 Units by mouth daily.   diltiazem (CARDIZEM) 120 MG tablet Take  1 tablet  2 x /day (every 12 hours)  for BP                                      /                 TAKE                           BY                     MOUTH   furosemide (LASIX) 20 MG tablet TAKE 1 TABLET(20 MG) BY MOUTH DAILY AS NEEDED FOR SWELLING   meclizine (ANTIVERT) 25 MG tablet 1/2-1 pill up to 3 times daily for motion sickness/dizziness   rosuvastatin (CRESTOR) 20 MG tablet Take  1 tablet  Daily  for Cholesterol   torsemide (DEMADEX) 20 MG tablet Take  1 tablet every morning for Fluid Retention & Leg Swelling   vitamin B-12  (CYANOCOBALAMIN) 250 MCG tablet Take 250 mcg by mouth daily.   alendronate (FOSAMAX) 70 MG tablet Take 1 tablet (70 mg total) by mouth once a week. Take on empty stomach with full glass of water and avoid reclining for 30 min after taking med. (Patient not taking: Reported on 05/15/2022)   famotidine (PEPCID) 20 MG tablet 20 mg daily. As needed. (Patient not taking: Reported on 05/15/2022)   potassium chloride SA (KLOR-CON) 20 MEQ tablet TAKE 1 TABLET BY MOUTH TWICE DAILY FOR POTASSIUM (Patient not taking: Reported on 05/15/2022)   sertraline (ZOLOFT) 100 MG tablet Take  1 tablet  Daily for Mood                                              /                 TAKE                         BY                        MOUTH (Patient not taking: Reported on 05/15/2022)   triamcinolone cream (KENALOG) 0.5 % APPLY TOPICALLY TO THE AFFECTED AREA TWICE DAILY AS NEEDED FOR ITCHING (Patient not taking: Reported on 05/15/2022)   No current facility-administered medications on file prior to visit.    ROS: all negative except above.   Physical Exam:  BP (!) 140/74   Pulse 90   Temp 97.7 F (36.5 C)   Ht _0  (1.6 m)   Wt 113 lb 6.4 oz (51.4 kg)   SpO2 98%   BMI 20.09 kg/m   General Appearance: Well nourished, in no apparent distress. Eyes: PERRLA, EOMs, conjunctiva no swelling or erythema Neck: Supple, thyroid normal.  Respiratory: Respiratory effort normal, BS equal bilaterally without rales, rhonchi, wheezing or stridor.  Cardio: RRR with no MRGs. Brisk peripheral pulses without edema.  Abdomen: Soft, + BS.  Non tender, no guarding, rebound, hernias, masses. Lymphatics: Non tender without lymphadenopathy.  Musculoskeletal: Full ROM, 5/5 strength, normal gait. No  CVA tenderness Skin: Warm, dry without rashes, lesions, ecchymosis.  Neuro: Cranial nerves intact. Normal muscle tone, no cerebellar symptoms. Sensation intact.  Psych: Awake and oriented X 3, normal affect, Insight and Judgment appropriate.      Latoya Rossetti, NP 11:19 AM [name]

## 2022-05-16 LAB — URINALYSIS, ROUTINE W REFLEX MICROSCOPIC
Bacteria, UA: NONE SEEN /HPF
Bilirubin Urine: NEGATIVE
Glucose, UA: NEGATIVE
Hgb urine dipstick: NEGATIVE
Nitrite: NEGATIVE
RBC / HPF: NONE SEEN /HPF (ref 0–2)
Specific Gravity, Urine: 1.027 (ref 1.001–1.035)
WBC, UA: NONE SEEN /HPF (ref 0–5)
pH: 5.5 (ref 5.0–8.0)

## 2022-05-16 LAB — URINE CULTURE
MICRO NUMBER:: 13899625
SPECIMEN QUALITY:: ADEQUATE

## 2022-05-16 LAB — MICROSCOPIC MESSAGE

## 2022-05-19 ENCOUNTER — Encounter: Payer: Self-pay | Admitting: Internal Medicine

## 2022-05-19 ENCOUNTER — Ambulatory Visit: Payer: Medicare Other | Admitting: Internal Medicine

## 2022-05-19 VITALS — BP 120/80 | HR 92 | Ht 63.0 in | Wt 112.8 lb

## 2022-05-19 DIAGNOSIS — Z8639 Personal history of other endocrine, nutritional and metabolic disease: Secondary | ICD-10-CM

## 2022-05-19 DIAGNOSIS — E042 Nontoxic multinodular goiter: Secondary | ICD-10-CM

## 2022-05-19 NOTE — Patient Instructions (Addendum)
We will need another thyroid U/S.  Please come back for a follow-up appointment in 2 years.

## 2022-05-19 NOTE — Progress Notes (Signed)
Patient ID: Latoya [name], female   DOB: 01/26/1951, 71 y.o.   MRN: [id number]   HPI  Latoya [name] is a 71 y.o.-year-old female, initially referred by her PCP, Latoya Pinto, MD, returning for follow-up for thyroid nodule and h/o thyroid ds.  Last visit 1 year ago.  Interim history: At last visit she described insomnia (Xanax was helping), also tremors and sweating.  She still describes insomnia now, and feels that she has too much energy. She is still grieving for her husband, which she married at 71 years old and he died in 64/1583 due to complications from COPD.  Reviewed history: She was told she had thyroid ds. and started on thyroid medications for "many years", then stopped long time ago.    She also has a history of thyroid nodules: Thyroid U/S (10/23/2018): 4 nodules, of which 1 met criteria for surveillance: COMPARISON:  05/28/2017 Parenchymal Echotexture: Mildly heterogenous Isthmus: 0.3 cm Right lobe: 4.0 cm x 1.7 cm x 1.3 cm Left lobe: 3.7 cm x 1.0 cm x 1.1 cm __________________________________________________   Nodule # 1: Location: Right; Superior Maximum size: 1.2 cm; Other 2 dimensions: 0.8 cm x 0.8 cm. Previous measurement is greatest 1.4 cm Composition: solid/almost completely solid (2) Echogenicity: isoechoic (1) Echogenic foci: punctate echogenic foci (3) Nodule meets criteria for surveillance _________________________________________________________   Nodule # 2: Location: Right; Inferior Maximum size: 0.6 cm; Other 2 dimensions: 0.4 cm x 0.3 cm Composition: spongiform (0) Spongiform nodule does not meet criteria for surveillance or biopsy _______________________________________________________   Nodule # 3: Location: Left; Superior Maximum size: 1.0 cm; Other 2 dimensions: 0.8 cm x 0.4 cm Composition: cystic/almost completely cystic (0) Echogenicity: anechoic (0) Cystic nodule does not meet criteria for surveillance or  biopsy _________________________________________________________   Nodule # 4: Location: Left; Inferior Maximum size: 0.6 cm; Other 2 dimensions: 0.4 cm x 0.3 cm Composition: spongiform (0) Spongiform nodule does not meet criteria for surveillance or biopsy  ____________________________________________________   No adenopathy   IMPRESSION: Right superior thyroid nodule (labeled 1) meets criteria for surveillance, as designated by the newly established ACR TI-RADS criteria. Surveillance ultrasound study recommended to be performed annually up to 5 years.   No other thyroid nodule meets criteria for biopsy or surveillance, as designated by the newly established ACR TI-RADS criteria.  Thyroid U/S (11/20/2020): 2 dominant nodules of which 1 met criteria for surveillance:  Parenchymal Echotexture: Moderately heterogenous Isthmus: 0.2 cm Right lobe: 4.0 cm x 1.3 cm x 1.6 cm Left lobe: 3.6 cm x 1.1 cm x 1.4 cm  _________________________________________________________   Nodule labeled 1 in the superior right thyroid, measures slightly smaller than previous, now 1.3 cm. This remains TR 4 and meets criteria for surveillance   Nodule labeled 2, superior left thyroid, with cystic changes, TR 2) and does not meet criteria for surveillance or biopsy.   No adenopathy   IMPRESSION: Right superior thyroid nodule (labeled 1, TR 4, 1.3 cm) meets criteria for surveillance, as designated by the newly established ACR TI-RADS criteria. Surveillance ultrasound study recommended to be performed annually up to 5 years.  Pt denies: - feeling nodules in neck - hoarseness - dysphagia - choking Occasional food getting stuck in the throat - dry mouth.  I reviewed pt's thyroid tests: Lab Results  Component Value Date   TSH 2.59 02/22/2022   TSH 1.31 11/09/2021   TSH 2.22 05/13/2021   TSH 1.28 11/08/2020   TSH 3.18 05/25/2020   TSH 1.69 11/06/2019  TSH 2.08 08/07/2019   TSH 1.49  05/06/2019   TSH 3.25 01/01/2019   TSH 1.90 10/03/2018   FREET4 0.71 05/13/2021   FREET4 1.0 01/01/2019    Her thyroid antibodies were not elevated: Component     Latest Ref Rng & Units 01/01/2019  Thyroperoxidase Ab SerPl-aCnc     <9 IU/mL <1  Thyroglobulin Ab     < or = 1 IU/mL <1   She was on 10,000 mcg Biotin daily but she came off in the past before tests.  She previously complained of: - + fatigue, but she is usually very active - + hair loss - + irritability - + heat intolerance + sweating - + occasionally  tremors - + occasionally palpitations - + both anxiety/depression - chronic - no hyperdefecation/+ constipation - no signif. weight loss/weight gain - + dry skin - chronic  She continues to have most of the above symptoms, except, she did lose weight since last visit.  No FH of thyroid ds.: daughter with hypothyroidism. No FH of thyroid cancer. No h/o radiation tx to head or neck. No steroid use. No herbal supplements.   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  ROS: + see HPI  Past Medical History:  Diagnosis Date   Anxiety and depression    Arthritis    Colon polyps    GERD (gastroesophageal reflux disease)    Hemorrhoids    Hiatal hernia    History of kidney stones    passed   Hyperlipemia    Mixed hyperlipidemia 11/26/2013   Panic disorder    Passive smoke exposure 07/17/2018   Renal insufficiency    stage 3 kidney disease per her PCP   Stomach ulcer    Unspecified essential hypertension 11/26/2013   Past Surgical History:  Procedure Laterality Date   ABDOMINAL HYSTERECTOMY     CARPOMETACARPEL SUSPENSION PLASTY Right 12/08/2013   Procedure: CARPOMETACARPEL [facility]) SUSPENSION PLASTY;  Surgeon: Jolyn Nap, MD;  Location: [facility];  Service: Orthopedics;  Laterality: Right;   COLONOSCOPY     FOOT SURGERY Left 2008   Bunion and pulled tendons   HAND SURGERY Right    Dog  Bite   I & D EXTREMITY Right 01/20/2017   Procedure: RIGHT HAND IRRIGATION AND DEBRIDEMENT AND REPAIR AS INDICATED;  Surgeon: Iran Planas, MD;  Location: [place];  Service: Orthopedics;  Laterality: Right;   OOPHORECTOMY Right    TUBAL LIGATION     UPPER GASTROINTESTINAL ENDOSCOPY     Social History   Socioeconomic History   Marital status: Widowed    Spouse name: Not on file   Number of children: 2   Years of education: Not on file   Highest education level: Not on file  Occupational History   Occupation: Housewife  Tobacco Use   Smoking status: Never    Passive exposure: Yes   Smokeless tobacco: Never   Tobacco comments:    Husband & Father smoked  Vaping Use   Vaping Use: Never used  Substance and Sexual Activity   Alcohol use: No    Alcohol/week: 0.0 standard drinks of alcohol   Drug use: No   Sexual activity: Not Currently    Partners: Male    Birth control/protection: Post-menopausal  Other Topics Concern   Not on file  Social History Narrative   Originally from [place]. Has always lived in [place]. Previously has traveled to [place].  No international travel. Enjoys planting gardens and working in her flowers. 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 No bird, mold, or hot tub exposure.    Social Determinants of Health   Financial Resource Strain: Not on file  Food Insecurity: Not on file  Transportation Needs: Not on file  Physical Activity: Sufficiently Active (04/01/2018)   Exercise Vital Sign    Days of Exercise per Week: 7 days    Minutes of Exercise per Session: 30 min  Stress: Stress Concern Present (04/01/2018)   [place]    Feeling of Stress : To some extent  Social Connections: Not on file  Intimate Partner Violence: Not on file   Current Outpatient Medications on File Prior to Visit  Medication Sig Dispense Refill   ALPRAZolam (XANAX) 0.5 MG tablet Take 1/2  to 1 tablet 2 to 3 x  /day as needed for Nerves 60 tablet 0   Ascorbic Acid (VITAMIN C) 1000 MG tablet Take 1,000 mg by mouth daily.     aspirin 81 MG tablet Take 81 mg by mouth at bedtime.      cholecalciferol (VITAMIN D) 1000 units tablet Take 10,000 Units by mouth daily.     diltiazem (CARDIZEM) 120 MG tablet Take  1 tablet  2 x /day (every 12 hours)  for BP                                      /                 TAKE                           BY                     MOUTH 180 tablet 3   furosemide (LASIX) 20 MG tablet TAKE 1 TABLET(20 MG) BY MOUTH DAILY AS NEEDED FOR SWELLING 90 tablet 3   meclizine (ANTIVERT) 25 MG tablet 1/2-1 pill up to 3 times daily for motion sickness/dizziness 30 tablet 0   nitrofurantoin, macrocrystal-monohydrate, (MACROBID) 100 MG capsule Take 1 capsule (100 mg total) by mouth 2 (two) times daily for 7 days. 14 capsule 0   potassium chloride SA (KLOR-CON) 20 MEQ tablet TAKE 1 TABLET BY MOUTH TWICE DAILY FOR POTASSIUM (Patient not taking: Reported on 05/15/2022) 180 tablet 3   rosuvastatin (CRESTOR) 20 MG tablet Take  1 tablet  Daily  for Cholesterol 90 tablet 3   torsemide (DEMADEX) 20 MG tablet Take  1 tablet every morning for Fluid Retention & Leg Swelling 90 tablet 3   vitamin B-12 (CYANOCOBALAMIN) 250 MCG tablet Take 250 mcg by mouth daily.     No current facility-administered medications on file prior to visit.   Allergies  Allergen Reactions   Other     All pain medications: Unknown/ CODEINE   Prednisone Other (See Comments)    DYSPHORIA   Vioxx [Rofecoxib]     unknown   Entex T [Pseudoephedrine-Guaifenesin] Palpitations   Levaquin [Levofloxacin] Rash    Tolerated Avelox without adverse effect 11/08/15.   Sulfa Antibiotics Rash   Trovan [Alatrofloxacin] Rash   Family History  Problem Relation Age of Onset   Heart disease Brother        x2   Pancreatic cancer Brother  Dementia Brother    Heart attack Brother        26s-50s, was on drugs   Heart disease Father    Asthma  Mother    Heart Problems Maternal Grandfather    CVA Brother    Other Brother        "lots of health problems" - unsure specifics   Tuberculosis Daughter    Colon cancer Neg Hx    Stomach cancer Neg Hx    Breast cancer Neg Hx    PE: BP 120/80 (BP Location: Left Arm, Patient Position: Sitting, Cuff Size: Normal)   Pulse 92   Ht _0  (1.6 m)   Wt 112 lb 12.8 oz (51.2 kg)   SpO2 97%   BMI 19.98 kg/m  Wt Readings from Last 3 Encounters:  05/19/22 112 lb 12.8 oz (51.2 kg)  05/15/22 113 lb 6.4 oz (51.4 kg)  02/28/22 115 lb 9.6 oz (52.4 kg)   Constitutional:  Normal weight, in NAD Eyes: EOMI, no exophthalmos ENT: moist mucous membranes, no thyromegaly, no cervical lymphadenopathy Cardiovascular: RRR, No MRG Respiratory: CTA B Musculoskeletal: no deformities Skin: warm, no rashes Neurological: + tremor with outstretched hands  ASSESSMENT: 1. Thyroid nodules  2. H/o thyroid ds.  PLAN: 1. Thyroid nodule -Patient with history of several thyroid nodules, returning at last visit after a longer absence.  She now returns after 1 year.  Before last visit, she had a thyroid ultrasound checked in 11/2020, which showed that the dominant right superior thyroid nodule was fairly stable in size and there was a recommendation to continue to follow this.  The rest of the nodules were not worrisome.  Particularly, the left superior thyroid nodule, which had cystic changes, did not require any further follow-up.  On the previous thyroid ultrasound from 10/2018, she had several small nodules without microcalcifications, without internal blood flow, more wide than tall and well the limited from the surrounding tissues, and therefore, low risk. -She does not have a family history of thyroid cancer or personal history of radiation therapy to head or neck, favoring benignity -At today's visit, denies neck compression symptoms -I do not feel any masses on palpation of her neck -We will check another  ultrasound of her neck in follow-up for her nodules - RTC in 2 years  2. H/o thyroid ds. -It is unclear what type of thyroid disease she had before -She remembers that she was on medication for thyroid the past but off for many years -TFTs were normal at last check and these were checked again in 02/2022 with normal results. -No signs or symptoms of thyrotoxicosis or hypothyroidism, but she did have weight loss since last visit: ~12 lbs - she tells me she is not hungry -For now, we will continue to follow her conservatively  -we will check a TSH at today's visit -per her preference  Thyroid U/S (05/26/2022): Parenchymal Echotexture: Mildly heterogenous  Isthmus: 0.3 cm  Right lobe: 3.6 cm x 1.2 cm x 1.5 cm  Left lobe: 4.0 cm x 1.0 cm x 1.4 cm  _________________________________________________________   Estimated total number of nodules >/= 1 cm: 2 _________________________________________________________   Nodule labeled 1 superior right thyroid, 1.2 cm. Nodule remains TR 4. Nodule has been unchanged for 5 years now dating to study 05/28/2017.   Nodule labeled 2, superior left thyroid, 1.3 cm. Nodule again has cystic changes and does not meet criteria for surveillance.   No adenopathy   IMPRESSION: Unchanged appearance of heterogeneous thyroid  with right-sided TR 4 nodule. This nodule has now been unchanged for 5 years which meets the threshold to discontinue surveillance, as designated by the newly established ACR TI-RADS criteria.   The thyroid nodules do not appear to be worrisome.  No intervention needed for now.  [name], MD PhD [facility]

## 2022-05-26 ENCOUNTER — Ambulatory Visit
Admission: RE | Admit: 2022-05-26 | Discharge: 2022-05-26 | Disposition: A | Discharge status: Home / Self Care | Payer: Medicare Other | Source: Ambulatory Visit | Attending: Internal Medicine | Admitting: Internal Medicine

## 2022-05-26 DIAGNOSIS — E042 Nontoxic multinodular goiter: Secondary | ICD-10-CM | POA: Diagnosis not present

## 2022-05-29 NOTE — Progress Notes (Deleted)
AWV and follow up  Assessment and Plan:   Annual Medicare Wellness Visit Due annually  Health maintenance reviewed - requested covid 19 vaccine record  Essential hypertension - continue medications, DASH diet, exercise and monitor at home. Call if greater than 130/80.  -     CBC with Differential/Platelet -     CMP/GFR -     TSH - defer, just had  Mixed hyperlipidemia -continue medications, check lipids, decrease fatty foods, increase activity.  -     Lipid panel  Aortic atherosclerosis ([place]) - per CT 02/2015 Control blood pressure, cholesterol, glucose, increase exercise.   Multiple lentigines syndrome  Continue to monitor  Multiple thyroid nodules Dr. Cruzita Lederer is now following annually  -Recommended 5 years of annual thyroid US  CKD (chronic kidney disease) stage 2 Increase fluids, avoid NSAIDS, monitor sugars, will monitor  Other abnormal glucose Discussed general issues about diabetes pathophysiology and management., Educational material distributed., Suggested low cholesterol diet., Encouraged aerobic exercise., Discussed foot care., Reminded to get yearly retinal exam.  Hiatal hernia Continue GERD medication, follow up GI as needed Encouraged small meals, avoid fatty meals, avoid lying down after meals   Chronic rhinitis Allergy pill daily, nasal sprays, increase H20, allergy hygiene explained.  Gastroesophageal reflux disease, esophagitis presence not specified/LPR Continue H2 blocker, Add diet discussed  Generalized anxiety disorder Continue medications; doing well limiting benzo  stress management techniques discussed, increase water, good sleep hygiene discussed, increase exercise, and increase veggies.   Vitamin D deficiency At goal at recent check; continue to recommend supplementation for goal of 70-100 Defer vitamin D level  Chronic cough syndrome Continue GERD meds, follow up Dr. Melvyn Novas as needed.   Osteoporosis DEXA due 06/2021, - has  scheduled Has been declining alendronate, would consider pending results, otherwise will defer to Dr. Cruzita Lederer for consideration of prolia Discussed Vit D with K2 supplement and Calcium rich diet, weight bearing exercises  Need for influenza vaccine High dose quadrivalent administered without complication today     No orders of the defined types were placed in this encounter.     Over 30 minutes of face to face exam, counseling, chart review and critical decision making was performed Future Appointments  Date Time Provider [place]  05/30/2022 11:00 AM Alycia Rossetti, NP [facility] None  11/15/2022 10:00 AM Unk Pinto, MD [facility] None    Plan:   During the course of the visit the patient was educated and counseled about appropriate screening and preventive services including:   Pneumococcal vaccine  Prevnar 13 Influenza vaccine Td vaccine Screening electrocardiogram Bone densitometry screening Colorectal cancer screening Diabetes screening Glaucoma screening Nutrition counseling  Advanced directives: requested    Subjective:  Latoya [name] is a 71 y.o. female who presents for AWV and follow up. She has Esophageal reflux; Generalized anxiety disorder; Chronic cough; Essential hypertension; Hyperlipidemia, mixed; Abnormal glucose (prediabetes); Vitamin D deficiency; Medication management; Aortic atherosclerosis ([place]) by Abd CT scan 02/24/2015; Hiatal hernia; Chronic rhinitis; Laryngopharyngeal reflux (LPR); Multiple lentigines syndrome; FHx: heart disease; Osteoporosis; Recurrent acute serous otitis media of right ear; History of eustachian tube dysfunction; TB lung, latent; Tenderness of both temporomandibular joints; Multiple thyroid nodules; H/O thyroid disease; and CKD (chronic kidney disease) stage 2, GFR 68 ml/min on their problem list.    Husband passed in 2021 with late stage lung disease, has 2 grown daughters.  She  has a diagnosis of  depression/anxiety and is currently on zoloft 100 mg daily, xanax 0.5 mg PRN, use  varies, avoids taking daily. She feels doing better recently.   She has GERD/LPR, had EGD 10/2017, on pepcid 40 mg daily per Dr. Carlean Purl; she also has dx of chronic cough syndrome per Dr. Melvyn Novas after extensive workup for persistent cough. She was notably evaluated by [facility]NT Dr. Pietro Cassis 05/12/2020 due to recurrent serous otitis and ear complaints; he diagnosed with eustachian tube dysfunction. He recommended she utilize Flonase and astalin nasal sprays PRN. She admits hasn't been using allergy meds, but does have at home to start if sx are bothersome.   BMI is There is no height or weight on file to calculate BMI., she is very active around yard but admits appetite hasn't been good, forgets to eat. Knows she needs to do better.  Wt Readings from Last 3 Encounters:  05/19/22 112 lb 12.8 oz (51.2 kg)  05/15/22 113 lb 6.4 oz (51.4 kg)  02/28/22 115 lb 9.6 oz (52.4 kg)   Her blood pressure has been controlled at home, today their BP is    She does workout, walking, yard work, house work. She denies chest pain, shortness of breath, dizziness.  She has aortic atherosclerosis per CT 02/2015.    She is on cholesterol medication (pravastatin 40 mg daily) and denies myalgias. Her cholesterol is at LDL goal. The cholesterol last visit was:   Lab Results  Component Value Date   CHOL 170 02/22/2022   HDL 57 02/22/2022   LDLCALC 94 02/22/2022   TRIG 96 02/22/2022   CHOLHDL 3.0 02/22/2022    She has been working on diet and exercise for glucose management, and denies paresthesia of the feet, polydipsia, polyuria and visual disturbances. Last A1C in the office was:  Lab Results  Component Value Date   HGBA1C 5.2 02/22/2022   Has had CKD IIIa range predating 2015 per lab review, though last check did improve to stage 2 range with pushing fluids, generally admits to poor water intake. Last GFR: Lab Results  Component  Value Date   GFRNONAA >60 12/25/2020   GFRNONAA 58 (L) 12/23/2020   GFRNONAA 50 (L) 11/08/2020   Patient is on Vitamin D supplement for deficency.   Lab Results  Component Value Date   VD25OH 50 02/22/2022     She has multinodular thyroid, last [place] 11/2020, now following with Dr. Cruzita Lederer, planning annual [place] follow up. Recent thyroid panel was normal.  Lab Results  Component Value Date   TSH 2.59 02/22/2022     Medication Review: Current Outpatient Medications on File Prior to Visit  Medication Sig   ALPRAZolam (XANAX) 0.5 MG tablet Take 1/2  to 1 tablet 2 to 3 x /day as needed for Nerves   Ascorbic Acid (VITAMIN C) 1000 MG tablet Take 1,000 mg by mouth daily.   aspirin 81 MG tablet Take 81 mg by mouth at bedtime.    cholecalciferol (VITAMIN D) 1000 units tablet Take 10,000 Units by mouth daily.   diltiazem (CARDIZEM) 120 MG tablet Take  1 tablet  2 x /day (every 12 hours)  for BP                                      /                 TAKE  BY                     MOUTH   furosemide (LASIX) 20 MG tablet TAKE 1 TABLET(20 MG) BY MOUTH DAILY AS NEEDED FOR SWELLING   meclizine (ANTIVERT) 25 MG tablet 1/2-1 pill up to 3 times daily for motion sickness/dizziness   potassium chloride SA (KLOR-CON) 20 MEQ tablet TAKE 1 TABLET BY MOUTH TWICE DAILY FOR POTASSIUM (Patient not taking: Reported on 05/15/2022)   rosuvastatin (CRESTOR) 20 MG tablet Take  1 tablet  Daily  for Cholesterol   torsemide (DEMADEX) 20 MG tablet Take  1 tablet every morning for Fluid Retention & Leg Swelling   vitamin B-12 (CYANOCOBALAMIN) 250 MCG tablet Take 250 mcg by mouth daily.   No current facility-administered medications on file prior to visit.     Allergies  Allergen Reactions   Other     All pain medications: Unknown/ CODEINE   Prednisone Other (See Comments)    DYSPHORIA   Vioxx [Rofecoxib]     unknown   Entex T [Pseudoephedrine-Guaifenesin] Palpitations   Levaquin [Levofloxacin]  Rash    Tolerated Avelox without adverse effect 11/08/15.   Sulfa Antibiotics Rash   Trovan [Alatrofloxacin] Rash    Current Problems (verified) Patient Active Problem List   Diagnosis Date Noted   CKD (chronic kidney disease) stage 2, GFR 68 ml/min 03/02/2022   Multiple thyroid nodules 05/13/2021   H/O thyroid disease 05/13/2021   Tenderness of both temporomandibular joints 10/13/2020   TB lung, latent 06/21/2020   Recurrent acute serous otitis media of right ear 05/19/2020   History of eustachian tube dysfunction 05/19/2020   Osteoporosis 06/23/2019   FHx: heart disease 11/27/2017   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   Laryngopharyngeal reflux (LPR) 10/12/2016   Chronic rhinitis 10/09/2016   Hiatal hernia 07/14/2016   Aortic atherosclerosis ([place]) by Abd CT scan 02/24/2015 06/30/2016   Abnormal glucose (prediabetes) 05/01/2014   Vitamin D deficiency 05/01/2014   Medication management 05/01/2014   Essential hypertension 11/26/2013   Hyperlipidemia, mixed 11/26/2013   Chronic cough 09/19/2013   Generalized anxiety disorder 07/11/2013   Esophageal reflux 11/29/2012    Screening Tests Immunization History  Administered Date(s) Administered   Influenza Split 06/04/2014, 06/05/2015   Influenza, High Dose Seasonal PF 05/30/2017, 05/22/2018, 05/21/2019, 05/26/2021   Influenza-Unspecified 06/06/2013, 06/13/2016   Pneumococcal Conjugate-13 06/05/2015   Pneumococcal Polysaccharide-23 04/06/2014   Pneumococcal-Unspecified 09/04/1996, 09/04/2016   Td 09/04/2005   Tdap 03/22/2017   Preventative care: Last colonoscopy: 12/2012 Carlean Purl, 10 year follow up EGD 10/2017  Last mammogram: 06/23/2020 Last pap smear/pelvic exam: 2016 normal  DEXA: 06/2019 osteoporosis, T -2.7, fosamax was initiated but admits hasn't been taking. Has follow up DEXA scheduled 06/23/2021.   US thyroid 11/19/2020, multinodular thyroid, Dr. Cruzita Lederer now following with annual [place]  Prior vaccinations: TD or  Tdap: 2018  Influenza: 2020 TODAY Pneumococcal: 2015 Prevnar13: 2016 Shingles/Zostavax: check with insurance  Covid 19: has had 2/2, 2021, + booster - record requested  Names of Other Physician/Practitioners you currently use: 1. [facility] here for primary care 2. [facility], eye doctor, last visit 2021, has schedulee 12/2020 3. [facility], dentist, last visit 2020, Due, encouraged to schedule   Patient Care Team: Unk Pinto, MD as PCP - General (Internal Medicine) Aquilla Hacker, MD as Referring Physician (Psychiatry) Jannette Spanner, MD as Referring Physician (Dermatology) Carleene Mains, [facility] as Pharmacist (Pharmacist)  SURGICAL HISTORY She  has a past  surgical history that includes Abdominal hysterectomy; Tubal ligation; Foot surgery (Left, 2008); Colonoscopy; Upper gastrointestinal endoscopy; Oophorectomy (Right); Carpometacarpel [facility]) suspension plasty (Right, 12/08/2013); I & D extremity (Right, 01/20/2017); and Hand surgery (Right). FAMILY HISTORY Her family history includes Asthma in her mother; CVA in her brother; Dementia in her brother; Heart Problems in her maternal grandfather; Heart attack in her brother; Heart disease in her brother and father; Other in her brother; Pancreatic cancer in her brother; Tuberculosis in her daughter. SOCIAL HISTORY She  reports that she has never smoked. She has been exposed to tobacco smoke. She has never used smokeless tobacco. She reports that she does not drink alcohol and does not use drugs.  Depression/mood screen:      11/09/2021   10:21 AM  Depression screen PHQ 2/9  Decreased Interest 0  Down, Depressed, Hopeless 0  PHQ - 2 Score 0      Review of Systems  Constitutional:  Negative for malaise/fatigue and weight loss.  HENT:  Positive for hearing loss (R, has number to schedule for hearing aids). Negative for tinnitus.   Eyes:  Negative for blurred vision and double vision.   Respiratory:  Negative for cough, shortness of breath and wheezing.   Cardiovascular:  Negative for chest pain, palpitations, orthopnea, claudication and leg swelling.  Gastrointestinal:  Negative for abdominal pain, blood in stool, constipation, diarrhea, heartburn, melena, nausea and vomiting.  Genitourinary: Negative.   Musculoskeletal:  Negative for joint pain and myalgias.  Skin:  Negative for rash.  Neurological:  Negative for dizziness, tingling, sensory change, weakness and headaches.  Endo/Heme/Allergies:  Positive for environmental allergies (mild). Negative for polydipsia.  Psychiatric/Behavioral: Negative.  Negative for depression (improved with meds), memory loss, substance abuse and suicidal ideas. The patient is not nervous/anxious and does not have insomnia.   All other systems reviewed and are negative.   Objective:     There were no vitals filed for this visit.  There is no height or weight on file to calculate BMI.  General appearance: alert, no distress, WD/WN, female HEENT: normocephalic, sclerae anicteric, TMs pearly, nares patent, no discharge or erythema, pharynx normal Oral cavity: MMM, no lesions Neck: supple, no lymphadenopathy, no thyromegaly, no masses Heart: RRR, normal S1, S2, no murmurs Lungs: CTA bilaterally, no wheezes, rhonchi, or rales Abdomen: +bs, soft, non-tender, non distended, no masses, no hepatomegaly,  Splenomegaly noted.   Musculoskeletal: Full ROM all extremities, nontender, no swelling, no obvious deformity. Non-antalgic gait.  Extremities: no edema, no cyanosis, no clubbing Pulses: 2+ symmetric, upper and lower extremities, normal cap refill Neurological: alert, oriented x 3, CN2-12 intact, strength normal upper extremities and lower extremities, sensation normal throughout, DTRs 2+  no cerebellar signs. Psychiatric: Normal affect, behavior normal, pleasant  Breasts: declines GU: no concerns, defer  The patient's weight, height, BMI,  and visual acuity have been recorded in the chart.  I have made referrals, counseling, and provided education to the patient based on review of the above and I have provided the patient with a written personalized care plan for preventive services.     Alycia Rossetti, NP 12:19 PM [facility]

## 2022-05-30 ENCOUNTER — Ambulatory Visit: Payer: Medicare Other | Admitting: Nurse Practitioner

## 2022-05-30 DIAGNOSIS — I700 Atherosclerosis of aorta: Secondary | ICD-10-CM

## 2022-05-30 DIAGNOSIS — E042 Nontoxic multinodular goiter: Secondary | ICD-10-CM

## 2022-05-30 DIAGNOSIS — R053 Chronic cough: Secondary | ICD-10-CM

## 2022-05-30 DIAGNOSIS — Z79899 Other long term (current) drug therapy: Secondary | ICD-10-CM

## 2022-05-30 DIAGNOSIS — R7309 Other abnormal glucose: Secondary | ICD-10-CM

## 2022-05-30 DIAGNOSIS — J310 Chronic rhinitis: Secondary | ICD-10-CM

## 2022-05-30 DIAGNOSIS — Q8789 Other specified congenital malformation syndromes, not elsewhere classified: Secondary | ICD-10-CM

## 2022-05-30 DIAGNOSIS — E559 Vitamin D deficiency, unspecified: Secondary | ICD-10-CM

## 2022-05-30 DIAGNOSIS — K219 Gastro-esophageal reflux disease without esophagitis: Secondary | ICD-10-CM

## 2022-05-30 DIAGNOSIS — E782 Mixed hyperlipidemia: Secondary | ICD-10-CM

## 2022-05-30 DIAGNOSIS — F411 Generalized anxiety disorder: Secondary | ICD-10-CM

## 2022-05-30 DIAGNOSIS — K449 Diaphragmatic hernia without obstruction or gangrene: Secondary | ICD-10-CM

## 2022-05-30 DIAGNOSIS — M810 Age-related osteoporosis without current pathological fracture: Secondary | ICD-10-CM

## 2022-05-30 DIAGNOSIS — I10 Essential (primary) hypertension: Secondary | ICD-10-CM

## 2022-05-30 DIAGNOSIS — N182 Chronic kidney disease, stage 2 (mild): Secondary | ICD-10-CM

## 2022-05-30 DIAGNOSIS — Z0000 Encounter for general adult medical examination without abnormal findings: Secondary | ICD-10-CM

## 2022-06-01 ENCOUNTER — Encounter: Payer: Self-pay | Admitting: Nurse Practitioner

## 2022-06-01 ENCOUNTER — Ambulatory Visit (INDEPENDENT_AMBULATORY_CARE_PROVIDER_SITE_OTHER): Payer: Medicare Other | Admitting: Nurse Practitioner

## 2022-06-01 VITALS — BP 108/68 | HR 82 | Temp 97.5°F | Ht 63.0 in | Wt 112.6 lb

## 2022-06-01 DIAGNOSIS — R350 Frequency of micturition: Secondary | ICD-10-CM | POA: Diagnosis not present

## 2022-06-01 DIAGNOSIS — L03116 Cellulitis of left lower limb: Secondary | ICD-10-CM

## 2022-06-01 DIAGNOSIS — I10 Essential (primary) hypertension: Secondary | ICD-10-CM | POA: Diagnosis not present

## 2022-06-01 DIAGNOSIS — R600 Localized edema: Secondary | ICD-10-CM | POA: Diagnosis not present

## 2022-06-01 DIAGNOSIS — S81812A Laceration without foreign body, left lower leg, initial encounter: Secondary | ICD-10-CM

## 2022-06-01 MED ORDER — CEPHALEXIN 500 MG PO CAPS: 500.0000 mg | ORAL_CAPSULE | Freq: Two times a day (BID) | ORAL | 0 refills | Status: DC

## 2022-06-01 NOTE — Progress Notes (Signed)
Assessment and Plan:  Latoya [name] was seen today for urinary tract infection.  Diagnoses and all orders for this visit:  Essential hypertension - continue medications, DASH diet, exercise and monitor at home. Call if greater than 130/80.   Laceration of left lower leg, initial encounter Do not put mercurochrome, use neosporin If redness worsens or develops drainage notify the office -     cephALEXin (KEFLEX) 500 MG capsule; Take 1 capsule (500 mg total) by mouth 2 (two) times daily for 7 days.  Cellulitis of left lower leg Do not put mercurochrome, use neosporin If redness worsens or develops drainage notify the office UTD on tetanus from 2018 -     cephALEXin (KEFLEX) 500 MG capsule; Take 1 capsule (500 mg total) by mouth 2 (two) times daily for 7 days. -     CBC with Differential/Platelet  Urinary frequency -     CBC with Differential/Platelet -     Urine Culture -     Urinalysis, Routine w reflex microscopic Will treat pending results    Edema Is only taeing Torsemide and is doing so infrequently, is not taking Furosemide or Potassium Stressed the importance of limiting use of this medication  Further disposition pending results of labs. Discussed med's effects and SE's.   Over 30 minutes of exam, counseling, chart review, and critical decision making was performed.   Future Appointments  Date Time Provider [place]  06/07/2022  3:30 PM Alycia Rossetti, NP [facility] None  11/15/2022 10:00 AM Unk Pinto, MD [facility] None    ------------------------------------------------------------------------------------------------------------------   HPI BP 108/68   Pulse 82   Temp (!) 97.5 F (36.4 C)   Ht 5' 3" (1.6 m)   Wt 112 lb 9.6 oz (51.1 kg)   SpO2 97%   BMI 19.95 kg/m   71 y.o.female presents for she hit her left lower leg on a piece of wood 3 days ago .  The area is more red and tender. She has been putting mercurochrome on her leg .  She is  having some lower back pain that radiates to her right lower abdomen. Has also noted some urinary frequency.  Denies dysuria, hematuria.   BP is currently well controlled with Cardizem 120 mg daily.  Denies headaches, chest pain, shortness of breath and dizziness. She does use Torsemide infrequently- has stopped taking Potassium and does not take Lasix  BP Readings from Last 3 Encounters:  06/01/22 108/68  05/19/22 120/80  05/15/22 (!) 140/74   Lab Results  Component Value Date   EGFR 68 02/22/2022   BMI is Body mass index is 19.95 kg/m., Wt Readings from Last 3 Encounters:  06/01/22 112 lb 9.6 oz (51.1 kg)  05/19/22 112 lb 12.8 oz (51.2 kg)  05/15/22 113 lb 6.4 oz (51.4 kg)    Immunization History  Administered Date(s) Administered   Influenza Split 06/04/2014, 06/05/2015   Influenza, High Dose Seasonal PF 05/30/2017, 05/22/2018, 05/21/2019, 05/26/2021   Influenza-Unspecified 06/06/2013, 06/13/2016   Pneumococcal Conjugate-13 06/05/2015   Pneumococcal Polysaccharide-23 04/06/2014   Pneumococcal-Unspecified 09/04/1996, 09/04/2016   Td 09/04/2005   Tdap 03/22/2017     Past Medical History:  Diagnosis Date   Anxiety and depression    Arthritis    Colon polyps    GERD (gastroesophageal reflux disease)    Hemorrhoids    Hiatal hernia    History of kidney stones    passed   Hyperlipemia    Mixed hyperlipidemia 11/26/2013   Panic disorder  Passive smoke exposure 07/17/2018   Renal insufficiency    stage 3 kidney disease per her PCP   Stomach ulcer    Unspecified essential hypertension 11/26/2013     Allergies  Allergen Reactions   Other     All pain medications: Unknown/ CODEINE   Prednisone Other (See Comments)    DYSPHORIA   Vioxx [Rofecoxib]     unknown   Entex T [Pseudoephedrine-Guaifenesin] Palpitations   Levaquin [Levofloxacin] Rash    Tolerated Avelox without adverse effect 11/08/15.   Sulfa Antibiotics Rash   Trovan [Alatrofloxacin] Rash    Current  Outpatient Medications on File Prior to Visit  Medication Sig   ALPRAZolam (XANAX) 0.5 MG tablet Take 1/2  to 1 tablet 2 to 3 x /day as needed for Nerves   Ascorbic Acid (VITAMIN C) 1000 MG tablet Take 1,000 mg by mouth daily.   aspirin 81 MG tablet Take 81 mg by mouth at bedtime.    cholecalciferol (VITAMIN D) 1000 units tablet Take 10,000 Units by mouth daily.   diltiazem (CARDIZEM) 120 MG tablet Take  1 tablet  2 x /day (every 12 hours)  for BP                                      /                 TAKE                           BY                     MOUTH   furosemide (LASIX) 20 MG tablet TAKE 1 TABLET(20 MG) BY MOUTH DAILY AS NEEDED FOR SWELLING   meclizine (ANTIVERT) 25 MG tablet 1/2-1 pill up to 3 times daily for motion sickness/dizziness   potassium chloride SA (KLOR-CON) 20 MEQ tablet TAKE 1 TABLET BY MOUTH TWICE DAILY FOR POTASSIUM (Patient not taking: Reported on 05/15/2022)   rosuvastatin (CRESTOR) 20 MG tablet Take  1 tablet  Daily  for Cholesterol   torsemide (DEMADEX) 20 MG tablet Take  1 tablet every morning for Fluid Retention & Leg Swelling   vitamin B-12 (CYANOCOBALAMIN) 250 MCG tablet Take 250 mcg by mouth daily.   No current facility-administered medications on file prior to visit.    ROS: all negative except above.   Physical Exam:  BP 108/68   Pulse 82   Temp (!) 97.5 F (36.4 C)   Ht 5' 3" (1.6 m)   Wt 112 lb 9.6 oz (51.1 kg)   SpO2 97%   BMI 19.95 kg/m   General Appearance: Well nourished, in no apparent distress. Eyes: PERRLA, EOMs, conjunctiva no swelling or erythema Sinuses: No Frontal/maxillary tenderness ENT/Mouth: Ext aud canals clear, TMs without erythema, bulging. No erythema, swelling, or exudate on post pharynx.  Tonsils not swollen or erythematous. Hearing normal.  Neck: Supple, thyroid normal.  Respiratory: Respiratory effort normal, BS equal bilaterally without rales, rhonchi, wheezing or stridor.  Cardio: RRR with no MRGs. Brisk peripheral  pulses without edema.  Abdomen: Soft, + BS.  Non tender, no guarding, rebound, hernias, masses. Lymphatics: Non tender without lymphadenopathy.  Musculoskeletal: Full ROM, 5/5 strength, normal gait. No CVA tenderness Skin: Warm, dry . 2-3 cm laceration on left shin, surrounding erythema, tender to touch Neuro: Cranial nerves intact. Normal  muscle tone, no cerebellar symptoms. Sensation intact.  Psych: Awake and oriented X 3, normal affect, Insight and Judgment appropriate.     Alycia Rossetti, NP 4:17 PM [facility]

## 2022-06-02 LAB — URINALYSIS, ROUTINE W REFLEX MICROSCOPIC
Bacteria, UA: NONE SEEN /HPF
Bilirubin Urine: NEGATIVE
Glucose, UA: NEGATIVE
Hgb urine dipstick: NEGATIVE
Ketones, ur: NEGATIVE
Leukocytes,Ua: NEGATIVE
Nitrite: NEGATIVE
RBC / HPF: NONE SEEN /HPF (ref 0–2)
Specific Gravity, Urine: 1.021 (ref 1.001–1.035)
Squamous Epithelial / HPF: NONE SEEN /HPF (ref <=5)
WBC, UA: NONE SEEN /HPF (ref 0–5)
pH: 6 (ref 5.0–8.0)

## 2022-06-02 LAB — CBC WITH DIFFERENTIAL/PLATELET
Absolute Monocytes: 568 cells/uL (ref 200–950)
Basophils Absolute: 32 cells/uL (ref 0–200)
Basophils Relative: 0.4 %
Eosinophils Absolute: 48 cells/uL (ref 15–500)
Eosinophils Relative: 0.6 %
HCT: 36.7 % (ref 35.0–45.0)
Hemoglobin: 11.9 g/dL (ref 11.7–15.5)
Lymphs Abs: 1160 cells/uL (ref 850–3900)
MCH: 30.2 pg (ref 27.0–33.0)
MCHC: 32.4 g/dL (ref 32.0–36.0)
MCV: 93.1 fL (ref 80.0–100.0)
MPV: 9.9 fL (ref 7.5–12.5)
Monocytes Relative: 7.1 %
Neutro Abs: 6192 cells/uL (ref 1500–7800)
Neutrophils Relative %: 77.4 %
Platelets: 204 10*3/uL (ref 140–400)
RBC: 3.94 10*6/uL (ref 3.80–5.10)
RDW: 13.4 % (ref 11.0–15.0)
Total Lymphocyte: 14.5 %
WBC: 8 10*3/uL (ref 3.8–10.8)

## 2022-06-02 LAB — URINE CULTURE
MICRO NUMBER:: 13982066
Result:: NO GROWTH
SPECIMEN QUALITY:: ADEQUATE

## 2022-06-02 LAB — MICROSCOPIC MESSAGE

## 2022-06-02 NOTE — Progress Notes (Signed)
Patient is aware of lab results. -e welch

## 2022-06-06 NOTE — Progress Notes (Addendum)
AWV and follow up  Assessment and Plan:   Annual Medicare Wellness Visit Due annually  Health maintenance reviewed Mammogram scheduled for 07/06/22 at 1:20 pm at [place] hypertension - continue medications, DASH diet, exercise and monitor at home. Call if greater than 130/80.  -     CBC with Differential/Platelet -     CMP/GFR   Mixed hyperlipidemia -continue medications, check lipids, decrease fatty foods, increase activity.  -     Lipid panel  Aortic atherosclerosis ([place]) - per CT 02/2015 Control blood pressure, cholesterol, glucose, increase exercise.   Multiple lentigines syndrome  Continue to monitor  Multiple thyroid nodules Dr. Cruzita Lederer is now following annually  -Recommended 5 years of annual thyroid US  CKD (chronic kidney disease) stage 2 Increase fluids, avoid NSAIDS, monitor sugars, will monitor - CMP, CBC  Other abnormal glucose Discussed general issues about diabetes pathophysiology and management., Educational material distributed., Suggested low cholesterol diet., Encouraged aerobic exercise., Discussed foot care., Reminded to get yearly retinal exam.  Hiatal hernia Continue GERD medication, follow up GI as needed Encouraged small meals, avoid fatty meals, avoid lying down after meals   Chronic rhinitis Allergy pill daily, nasal sprays, increase H20, allergy hygiene explained.  Gastroesophageal reflux disease, esophagitis presence not specified/LPR Continue H2 blocker, Add diet discussed - Magnesium  Generalized anxiety disorder Continue medications; doing well limiting benzo  stress management techniques discussed, increase water, good sleep hygiene discussed, increase exercise, and increase veggies.   Vitamin D deficiency At goal at recent check; continue to recommend supplementation for goal of 70-100 Defer vitamin D level  Chronic cough syndrome Continue GERD meds, follow up Dr. Melvyn Novas as needed.   Osteoporosis DEXA due  06/2021,T score -2.9 Has been declining alendronate, would consider pending results, otherwise will defer to Dr. Cruzita Lederer for consideration of prolia Discussed Vit D with K2 supplement and Calcium rich diet, weight bearing exercises  Senile Purpura(HCC) Moisturize skin, clothes protection Sent in triamcinolone to use as needed Monitor  Medication Management - Magnesium  Edema Pt is uncertain if she is taking Furosemide or Torsemide Advised to check when whe gets home and call with which prescription she uses so we can adjust in her chart     Over 30 minutes of face to face exam, counseling, chart review and critical decision making was performed Future Appointments  Date Time Provider Loiza  06/07/2022  3:30 PM Alycia Rossetti, NP [facility] None  11/15/2022 10:00 AM Unk Pinto, MD [facility] None    Plan:   During the course of the visit the patient was educated and counseled about appropriate screening and preventive services including:   Pneumococcal vaccine  Prevnar 13 Influenza vaccine Td vaccine Screening electrocardiogram Bone densitometry screening Colorectal cancer screening Diabetes screening Glaucoma screening Nutrition counseling  Advanced directives: requested    Subjective:  Latoya [name] is a 71 y.o. female who presents for AWV and follow up. She has Esophageal reflux; Generalized anxiety disorder; Chronic cough; Essential hypertension; Hyperlipidemia, mixed; Abnormal glucose (prediabetes); Vitamin D deficiency; Medication management; Aortic atherosclerosis ([place]) by Abd CT scan 02/24/2015; Hiatal hernia; Chronic rhinitis; Laryngopharyngeal reflux (LPR); Multiple lentigines syndrome; FHx: heart disease; Osteoporosis; Recurrent acute serous otitis media of right ear; History of eustachian tube dysfunction; TB lung, latent; Tenderness of both temporomandibular joints; Multiple thyroid nodules; H/O thyroid disease; and CKD (chronic kidney  disease) stage 2, GFR 68 ml/min on their problem list.    Husband passed in 2021 with late  stage lung disease, has 2 grown daughters.  She  has a diagnosis of depression/anxiety and is currently on xanax 0.5 mg PRN, uses rarely, avoids taking daily. She feels doing better recently. Last fill was 02/03/22 Aplrazolam 0.5 mg #60   PDMP appropriate  She is having some left lower back pain- feels like a sharp catch with some aching.  She took some aspirin with  minimal relief  She is having some sinus congestion but denies fever, headaches, nausea, vomiting or diarrhea. Appears to be related to environmental allergies   She has GERD/LPR, had EGD 10/2017, currently not on medication- was prescribed Famotidine through  Dr. Carlean Purl.  She also has dx of chronic cough syndrome per Dr. Melvyn Novas after extensive workup for persistent cough. She was notably evaluated by [facility] Dr. Pietro Cassis 05/12/2020 due to recurrent serous otitis and ear complaints; he diagnosed with eustachian tube dysfunction. He recommended she utilize Flonase and astalin nasal sprays PRN. She has not been using the medications but has been experiencing more dinus congestion  BMI is Body mass index is 20.02 kg/m., she is very active around yard but admits appetite hasn't been good, forgets to eat. Knows she needs to do better.  Wt Readings from Last 3 Encounters:  06/07/22 113 lb (51.3 kg)  06/01/22 112 lb 9.6 oz (51.1 kg)  05/19/22 112 lb 12.8 oz (51.2 kg)   Her blood pressure has been controlled at home, today their BP is BP: 118/70   BP Readings from Last 3 Encounters:  06/07/22 118/70  06/01/22 108/68  05/19/22 120/80  She does workout, walking, yard work, house work. She denies chest pain, shortness of breath, dizziness.  She has aortic atherosclerosis per CT 02/2015.   She does have senile purpura- ecchymoses of forearms and legs.   She is on cholesterol medication (rosuvastatin 20 mg daily) and denies myalgias. Her  cholesterol is at LDL goal. The cholesterol last visit was:   Lab Results  Component Value Date   CHOL 170 02/22/2022   HDL 57 02/22/2022   LDLCALC 94 02/22/2022   TRIG 96 02/22/2022   CHOLHDL 3.0 02/22/2022    She has been working on diet and exercise for glucose management, and denies paresthesia of the feet, polydipsia, polyuria and visual disturbances. Last A1C in the office was:  Lab Results  Component Value Date   HGBA1C 5.2 02/22/2022   Has had CKD IIIa range predating 2015 per lab review, though last check did improve to stage 2 range with pushing fluids, generally admits to poor water intake. Last GFR: Lab Results  Component Value Date   EGFR 68 02/22/2022    Patient is on Vitamin D supplement for deficency.   Lab Results  Component Value Date   VD25OH 16 02/22/2022     She has multinodular thyroid, last [place] 11/2020, now following with Dr. Cruzita Lederer, planning annual [place] follow up. Recent thyroid panel was normal.  Lab Results  Component Value Date   TSH 2.59 02/22/2022     Medication Review: Current Outpatient Medications on File Prior to Visit  Medication Sig   ALPRAZolam (XANAX) 0.5 MG tablet Take 1/2  to 1 tablet 2 to 3 x /day as needed for Nerves   Ascorbic Acid (VITAMIN C) 1000 MG tablet Take 1,000 mg by mouth daily.   aspirin 81 MG tablet Take 81 mg by mouth at bedtime.    cholecalciferol (VITAMIN D) 1000 units tablet Take 10,000 Units by mouth daily.   diltiazem (  CARDIZEM) 120 MG tablet Take  1 tablet  2 x /day (every 12 hours)  for BP                                      /                 TAKE                           BY                     MOUTH   furosemide (LASIX) 20 MG tablet TAKE 1 TABLET(20 MG) BY MOUTH DAILY AS NEEDED FOR SWELLING   meclizine (ANTIVERT) 25 MG tablet 1/2-1 pill up to 3 times daily for motion sickness/dizziness   rosuvastatin (CRESTOR) 20 MG tablet Take  1 tablet  Daily  for Cholesterol   torsemide (DEMADEX) 20 MG tablet Take  1 tablet  every morning for Fluid Retention & Leg Swelling   vitamin B-12 (CYANOCOBALAMIN) 250 MCG tablet Take 250 mcg by mouth daily.   potassium chloride SA (KLOR-CON) 20 MEQ tablet TAKE 1 TABLET BY MOUTH TWICE DAILY FOR POTASSIUM (Patient not taking: Reported on 05/15/2022)   No current facility-administered medications on file prior to visit.     Allergies  Allergen Reactions   Other     All pain medications: Unknown/ CODEINE   Prednisone Other (See Comments)    DYSPHORIA   Vioxx [Rofecoxib]     unknown   Entex T [Pseudoephedrine-Guaifenesin] Palpitations   Levaquin [Levofloxacin] Rash    Tolerated Avelox without adverse effect 11/08/15.   Sulfa Antibiotics Rash   Trovan [Alatrofloxacin] Rash    Current Problems (verified) Patient Active Problem List   Diagnosis Date Noted   CKD (chronic kidney disease) stage 2, GFR 68 ml/min 03/02/2022   Multiple thyroid nodules 05/13/2021   H/O thyroid disease 05/13/2021   Tenderness of both temporomandibular joints 10/13/2020   TB lung, latent 06/21/2020   Recurrent acute serous otitis media of right ear 05/19/2020   History of eustachian tube dysfunction 05/19/2020   Osteoporosis 06/23/2019   FHx: heart disease 11/27/2017   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   Laryngopharyngeal reflux (LPR) 10/12/2016   Chronic rhinitis 10/09/2016   Hiatal hernia 07/14/2016   Aortic atherosclerosis ([place]) by Abd CT scan 02/24/2015 06/30/2016   Abnormal glucose (prediabetes) 05/01/2014   Vitamin D deficiency 05/01/2014   Medication management 05/01/2014   Essential hypertension 11/26/2013   Hyperlipidemia, mixed 11/26/2013   Chronic cough 09/19/2013   Generalized anxiety disorder 07/11/2013   Esophageal reflux 11/29/2012    Screening Tests Immunization History  Administered Date(s) Administered   Influenza Split 06/04/2014, 06/05/2015   Influenza, High Dose Seasonal PF 05/30/2017, 05/22/2018, 05/21/2019, 05/26/2021, 06/06/2022    Influenza-Unspecified 06/06/2013, 06/13/2016   Pneumococcal Conjugate-13 06/05/2015   Pneumococcal Polysaccharide-23 04/06/2014   Pneumococcal-Unspecified 09/04/1996, 09/04/2016   Td 09/04/2005   Tdap 03/22/2017   Preventative care: Last colonoscopy: 12/2012 Carlean Purl, 10 year follow up EGD 10/2017  Last mammogram: 06/23/2020 Last pap smear/pelvic exam: 2016 normal  DEXA: 06/2019 osteoporosis, T -2.7, fosamax was initiated but admits hasn't been taking. Has follow up DEXA scheduled 06/23/2021.   US thyroid 11/19/2020, multinodular thyroid, Dr. Cruzita Lederer now following with annual [place] Health Maintenance  Topic Date Due   COVID-19 Vaccine (1) 06/23/2022 (Originally 05/03/1956)  Zoster Vaccines- Shingrix (1 of 2) 09/07/2022 (Originally 05/02/1970)   MAMMOGRAM  06/23/2022   COLONOSCOPY (Pts 45-67yr Insurance coverage will need to be confirmed)  12/13/2022   DEXA SCAN  06/24/2023   TETANUS/TDAP  03/23/2027   INFLUENZA VACCINE  Completed   Hepatitis C Screening  Completed   HPV VACCINES  Aged Out   Pneumonia Vaccine 71 Years old  Discontinued     Names of Other Physician/Practitioners you currently use: 1. [facility] here for primary care 2. [facility], eye doctor, last visit 24/2022 3. [facility], last visit 2020, Due, encouraged to schedule   Patient Care Team: MUnk Pinto MD as PCP - General (Internal Medicine) RAquilla Hacker MD as Referring Physician (Psychiatry) CJannette Spanner MD as Referring Physician (Dermatology) BCarleene Mains [facility]as Pharmacist (Pharmacist)  SURGICAL HISTORY She  has a past surgical history that includes Abdominal hysterectomy; Tubal ligation; Foot surgery (Left, 2008); Colonoscopy; Upper gastrointestinal endoscopy; Oophorectomy (Right); Carpometacarpel ([facility] suspension plasty (Right, 12/08/2013); I & D extremity (Right, 01/20/2017); and Hand surgery (Right). FAMILY HISTORY Her family history  includes Asthma in her mother; CVA in her brother; Dementia in her brother; Heart Problems in her maternal grandfather; Heart attack in her brother; Heart disease in her brother and father; Other in her brother; Pancreatic cancer in her brother; Tuberculosis in her daughter. SOCIAL HISTORY She  reports that she has never smoked. She has been exposed to tobacco smoke. She has never used smokeless tobacco. She reports that she does not drink alcohol and does not use drugs.  Depression/mood screen:      06/07/2022    2:41 PM  Depression screen PHQ 2/9  Decreased Interest 0  Down, Depressed, Hopeless 0  PHQ - 2 Score 0      Review of Systems  Constitutional:  Negative for malaise/fatigue and weight loss.  HENT:  Positive for hearing loss (R, has number to schedule for hearing aids). Negative for tinnitus.   Eyes:  Negative for blurred vision and double vision.  Respiratory:  Negative for cough, shortness of breath and wheezing.   Cardiovascular:  Negative for chest pain, palpitations, orthopnea, claudication and leg swelling.  Gastrointestinal:  Negative for abdominal pain, blood in stool, constipation, diarrhea, heartburn, melena, nausea and vomiting.  Genitourinary: Negative.   Musculoskeletal:  Negative for joint pain and myalgias. Back pain: RLQ intermittent. Skin:  Negative for rash.       ecchymoses  Neurological:  Negative for dizziness, tingling, sensory change, weakness and headaches.  Endo/Heme/Allergies:  Positive for environmental allergies (mild). Negative for polydipsia.  Psychiatric/Behavioral: Negative.  Negative for depression (improved with meds), memory loss, substance abuse and suicidal ideas. The patient is not nervous/anxious and does not have insomnia.   All other systems reviewed and are negative.   Objective:     Today's Vitals   06/07/22 1413  BP: 118/70  Pulse: 85  Temp: 97.7 F (36.5 C)  SpO2: 98%  Weight: 113 lb (51.3 kg)  Height: _0  (1.6 m)    Body mass index is 20.02 kg/m.  General appearance: alert, no distress, WD/WN, female HEENT: normocephalic, sclerae anicteric, TMs pearly, nares patent/ pale nasal mucosa, no discharge or erythema, pharynx normal Oral cavity: MMM, no lesions Neck: supple, no lymphadenopathy, no thyromegaly, no masses Heart: RRR, normal S1, S2, no murmurs Lungs: CTA bilaterally, no wheezes, rhonchi, or rales Abdomen: +bs, soft, non-tender, non distended, no masses, no hepatomegaly,  Splenomegaly noted.  Musculoskeletal: Full ROM all extremities, nontender, no swelling, no obvious deformity. Non-antalgic gait.  SKIN: warm, dry. Multiple skin ecchymoses and skin tears of forearms and shins Extremities: no edema, no cyanosis, no clubbing Pulses: 2+ symmetric, upper and lower extremities, normal cap refill Neurological: alert, oriented x 3, CN2-12 intact, strength normal upper extremities and lower extremities, sensation normal throughout, DTRs 2+  no cerebellar signs. Psychiatric: Normal affect, behavior normal, pleasant    The patient's weight, height, BMI, and visual acuity have been recorded in the chart.  I have made referrals, counseling, and provided education to the patient based on review of the above and I have provided the patient with a written personalized care plan for preventive services.     Alycia Rossetti, NP 2:46 PM [facility]

## 2022-06-07 ENCOUNTER — Ambulatory Visit (INDEPENDENT_AMBULATORY_CARE_PROVIDER_SITE_OTHER): Payer: Medicare Other | Admitting: Nurse Practitioner

## 2022-06-07 ENCOUNTER — Encounter: Payer: Self-pay | Admitting: Nurse Practitioner

## 2022-06-07 ENCOUNTER — Other Ambulatory Visit: Payer: Self-pay | Admitting: Internal Medicine

## 2022-06-07 VITALS — BP 118/70 | HR 85 | Temp 97.7°F | Ht 63.0 in | Wt 113.0 lb

## 2022-06-07 DIAGNOSIS — D692 Other nonthrombocytopenic purpura: Secondary | ICD-10-CM | POA: Diagnosis not present

## 2022-06-07 DIAGNOSIS — R7309 Other abnormal glucose: Secondary | ICD-10-CM

## 2022-06-07 DIAGNOSIS — I10 Essential (primary) hypertension: Secondary | ICD-10-CM | POA: Diagnosis not present

## 2022-06-07 DIAGNOSIS — Z1231 Encounter for screening mammogram for malignant neoplasm of breast: Secondary | ICD-10-CM

## 2022-06-07 DIAGNOSIS — E782 Mixed hyperlipidemia: Secondary | ICD-10-CM

## 2022-06-07 DIAGNOSIS — F411 Generalized anxiety disorder: Secondary | ICD-10-CM | POA: Diagnosis not present

## 2022-06-07 DIAGNOSIS — R6889 Other general symptoms and signs: Secondary | ICD-10-CM | POA: Diagnosis not present

## 2022-06-07 DIAGNOSIS — E559 Vitamin D deficiency, unspecified: Secondary | ICD-10-CM | POA: Diagnosis not present

## 2022-06-07 DIAGNOSIS — Z0000 Encounter for general adult medical examination without abnormal findings: Secondary | ICD-10-CM

## 2022-06-07 DIAGNOSIS — Q8789 Other specified congenital malformation syndromes, not elsewhere classified: Secondary | ICD-10-CM

## 2022-06-07 DIAGNOSIS — N1831 Chronic kidney disease, stage 3a: Secondary | ICD-10-CM

## 2022-06-07 DIAGNOSIS — K449 Diaphragmatic hernia without obstruction or gangrene: Secondary | ICD-10-CM

## 2022-06-07 DIAGNOSIS — K2100 Gastro-esophageal reflux disease with esophagitis, without bleeding: Secondary | ICD-10-CM | POA: Diagnosis not present

## 2022-06-07 DIAGNOSIS — I700 Atherosclerosis of aorta: Secondary | ICD-10-CM

## 2022-06-07 DIAGNOSIS — Z0001 Encounter for general adult medical examination with abnormal findings: Secondary | ICD-10-CM | POA: Diagnosis not present

## 2022-06-07 DIAGNOSIS — Z79899 Other long term (current) drug therapy: Secondary | ICD-10-CM | POA: Diagnosis not present

## 2022-06-07 DIAGNOSIS — M810 Age-related osteoporosis without current pathological fracture: Secondary | ICD-10-CM

## 2022-06-07 DIAGNOSIS — L814 Other melanin hyperpigmentation: Secondary | ICD-10-CM

## 2022-06-07 DIAGNOSIS — R609 Edema, unspecified: Secondary | ICD-10-CM

## 2022-06-07 MED ORDER — TRIAMCINOLONE ACETONIDE 0.5 % EX CREA: TOPICAL_CREAM | CUTANEOUS | 0 refills | Status: DC

## 2022-06-07 NOTE — Addendum Note (Signed)
Addended by: Lorenda Peck E on: 06/07/2022 03:00 PM   Modules accepted: Orders

## 2022-06-07 NOTE — Patient Instructions (Signed)
Wear compression socks daily Push water throughout the day and take outside with you Please call office and advise if you are taking Furosemide or Torsemide as needed for swelling  Use generic Zyrtec and flonase daily for the next month for allergies   Edema  Edema is when you have too much fluid in your body or under your skin. Edema may make your legs, feet, and ankles swell. Swelling often happens in looser tissues, such as around your eyes. This is a common condition. It gets more common as you get older. There are many possible causes of edema. These include: Eating too much salt (sodium). Being on your feet or sitting for a long time. Certain medical conditions, such as: Pregnancy. Heart failure. Liver disease. Kidney disease. Cancer. Hot weather may make edema worse. Edema is usually painless. Your skin may look swollen or shiny. Follow these instructions at home: Medicines Take over-the-counter and prescription medicines only as told by your doctor. Your doctor may prescribe a medicine to help your body get rid of extra water (diuretic). Take this medicine if you are told to take it. Eating and drinking Eat a low-salt (low-sodium) diet as told by your doctor. Sometimes, eating less salt may reduce swelling. Depending on the cause of your swelling, you may need to limit how much fluid you drink (fluid restriction). General instructions Raise the injured area above the level of your heart while you are sitting or lying down. Do not sit still or stand for a long time. Do not wear tight clothes. Do not wear garters on your upper legs. Exercise your legs. This can help the swelling go down. Wear compression stockings as told by your doctor. It is important that these are the right size. These should be prescribed by your doctor to prevent possible injuries. If elastic bandages or wraps are recommended, use them as told by your doctor. Contact a doctor if: Treatment is not  working. You have heart, liver, or kidney disease and have symptoms of edema. You have sudden and unexplained weight gain. Get help right away if: You have shortness of breath or chest pain. You cannot breathe when you lie down. You have pain, redness, or warmth in the swollen areas. You have heart, liver, or kidney disease and get edema all of a sudden. You have a fever and your symptoms get worse all of a sudden. These symptoms may be an emergency. Get help right away. Call 911. Do not wait to see if the symptoms will go away. Do not drive yourself to the hospital. Summary Edema is when you have too much fluid in your body or under your skin. Edema may make your legs, feet, and ankles swell. Swelling often happens in looser tissues, such as around your eyes. Raise the injured area above the level of your heart while you are sitting or lying down. Follow your doctor's instructions about diet and how much fluid you can drink. This information is not intended to replace advice given to you by your health care provider. Make sure you discuss any questions you have with your health care provider. Document Revised: 04/25/2021 Document Reviewed: 04/25/2021 Elsevier Patient Education  [employer].

## 2022-06-08 ENCOUNTER — Other Ambulatory Visit: Payer: Self-pay | Admitting: Nurse Practitioner

## 2022-06-08 DIAGNOSIS — E876 Hypokalemia: Secondary | ICD-10-CM

## 2022-06-08 LAB — COMPLETE METABOLIC PANEL WITH GFR
AG Ratio: 2.2 (calc) (ref 1.0–2.5)
ALT: 14 U/L (ref 6–29)
AST: 26 U/L (ref 10–35)
Albumin: 3.9 g/dL (ref 3.6–5.1)
Alkaline phosphatase (APISO): 58 U/L (ref 37–153)
BUN: 15 mg/dL (ref 7–25)
CO2: 26 mmol/L (ref 20–32)
Calcium: 8.9 mg/dL (ref 8.6–10.4)
Chloride: 108 mmol/L (ref 98–110)
Creat: 1 mg/dL (ref 0.60–1.00)
Globulin: 1.8 g/dL (calc) — ABNORMAL LOW (ref 1.9–3.7)
Glucose, Bld: 91 mg/dL (ref 65–99)
Potassium: 3.3 mmol/L — ABNORMAL LOW (ref 3.5–5.3)
Sodium: 144 mmol/L (ref 135–146)
Total Bilirubin: 0.3 mg/dL (ref 0.2–1.2)
Total Protein: 5.7 g/dL — ABNORMAL LOW (ref 6.1–8.1)
eGFR: 60 mL/min/{1.73_m2} (ref >=60)

## 2022-06-08 LAB — CBC WITH DIFFERENTIAL/PLATELET
Absolute Monocytes: 502 cells/uL (ref 200–950)
Basophils Absolute: 19 cells/uL (ref 0–200)
Basophils Relative: 0.3 %
Eosinophils Absolute: 68 cells/uL (ref 15–500)
Eosinophils Relative: 1.1 %
HCT: 35.3 % (ref 35.0–45.0)
Hemoglobin: 11.8 g/dL (ref 11.7–15.5)
Lymphs Abs: 849 cells/uL — ABNORMAL LOW (ref 850–3900)
MCH: 30.3 pg (ref 27.0–33.0)
MCHC: 33.4 g/dL (ref 32.0–36.0)
MCV: 90.7 fL (ref 80.0–100.0)
MPV: 9.8 fL (ref 7.5–12.5)
Monocytes Relative: 8.1 %
Neutro Abs: 4762 cells/uL (ref 1500–7800)
Neutrophils Relative %: 76.8 %
Platelets: 170 10*3/uL (ref 140–400)
RBC: 3.89 10*6/uL (ref 3.80–5.10)
RDW: 13.1 % (ref 11.0–15.0)
Total Lymphocyte: 13.7 %
WBC: 6.2 10*3/uL (ref 3.8–10.8)

## 2022-06-08 LAB — LIPID PANEL
Cholesterol: 129 mg/dL (ref <200)
HDL: 53 mg/dL (ref >=50)
LDL Cholesterol (Calc): 54 mg/dL (calc)
Non-HDL Cholesterol (Calc): 76 mg/dL (calc) (ref <130)
Total CHOL/HDL Ratio: 2.4 (calc) (ref <5.0)
Triglycerides: 138 mg/dL (ref <150)

## 2022-06-08 LAB — MAGNESIUM: Magnesium: 2.3 mg/dL (ref 1.5–2.5)

## 2022-06-08 MED ORDER — POTASSIUM CHLORIDE CRYS ER 20 MEQ PO TBCR: EXTENDED_RELEASE_TABLET | ORAL | 3 refills | Status: DC

## 2022-06-19 ENCOUNTER — Other Ambulatory Visit: Payer: Self-pay | Admitting: Nurse Practitioner

## 2022-06-19 ENCOUNTER — Telehealth: Payer: Self-pay | Admitting: Nurse Practitioner

## 2022-06-19 DIAGNOSIS — F411 Generalized anxiety disorder: Secondary | ICD-10-CM

## 2022-06-19 MED ORDER — SERTRALINE HCL 100 MG PO TABS: 100.0000 mg | ORAL_TABLET | Freq: Every day | ORAL | 3 refills | Status: AC

## 2022-06-19 NOTE — Telephone Encounter (Signed)
Pt is requesting a refill on Zoloft 90 day supply to go to [facility] on [address]

## 2022-06-19 NOTE — Telephone Encounter (Signed)
Done

## 2022-06-30 DIAGNOSIS — M25561 Pain in right knee: Secondary | ICD-10-CM | POA: Diagnosis not present

## 2022-06-30 DIAGNOSIS — M25562 Pain in left knee: Secondary | ICD-10-CM | POA: Diagnosis not present

## 2022-06-30 DIAGNOSIS — M25511 Pain in right shoulder: Secondary | ICD-10-CM | POA: Diagnosis not present

## 2022-06-30 DIAGNOSIS — M25512 Pain in left shoulder: Secondary | ICD-10-CM | POA: Diagnosis not present

## 2022-06-30 DIAGNOSIS — M542 Cervicalgia: Secondary | ICD-10-CM | POA: Diagnosis not present

## 2022-07-05 ENCOUNTER — Ambulatory Visit: Payer: Medicare Other | Admitting: Nurse Practitioner

## 2022-07-06 ENCOUNTER — Ambulatory Visit: Payer: Medicare Other

## 2022-07-11 ENCOUNTER — Ambulatory Visit (INDEPENDENT_AMBULATORY_CARE_PROVIDER_SITE_OTHER): Payer: Medicare Other | Admitting: Nurse Practitioner

## 2022-07-11 ENCOUNTER — Encounter: Payer: Self-pay | Admitting: Nurse Practitioner

## 2022-07-11 VITALS — BP 122/70 | HR 68 | Temp 98.0°F | Resp 18 | Ht 63.0 in | Wt 113.0 lb

## 2022-07-11 DIAGNOSIS — Z1152 Encounter for screening for COVID-19: Secondary | ICD-10-CM | POA: Diagnosis not present

## 2022-07-11 DIAGNOSIS — R5383 Other fatigue: Secondary | ICD-10-CM | POA: Diagnosis not present

## 2022-07-11 DIAGNOSIS — R0981 Nasal congestion: Secondary | ICD-10-CM | POA: Diagnosis not present

## 2022-07-11 DIAGNOSIS — J029 Acute pharyngitis, unspecified: Secondary | ICD-10-CM | POA: Diagnosis not present

## 2022-07-11 DIAGNOSIS — R053 Chronic cough: Secondary | ICD-10-CM

## 2022-07-11 LAB — POC COVID19 BINAXNOW

## 2022-07-11 MED ORDER — AZITHROMYCIN 250 MG PO TABS: ORAL_TABLET | ORAL | 1 refills | Status: DC

## 2022-07-11 NOTE — Patient Instructions (Signed)

## 2022-07-11 NOTE — Progress Notes (Signed)
Assessment and Plan:  Latoya [name] was seen today for an episodic visit.  Diagnoses and all order for this visit:  1. Encounter for screening for COVID-19 Negative Start Azithromycin as directed  2. Chronic cough Stay well hydrated Coricidin OTC if cough fails to improve.  3. Nasal congestion Stay well hydrated to keep mucus thin and productive Neti pot or hot steamy shower  4. Sore throat Warm salt water gargles several times thorughout the day. OTC Chloraseptic spray PRN  5. Other fatigue Rest  Orders Placed This Encounter  Procedures   POC COVID-19    Order Specific Question:   Previously tested for COVID-19    Answer:   No    Order Specific Question:   Resident in a congregate (group) care setting    Answer:   No    Order Specific Question:   Employed in healthcare setting    Answer:   No    Order Specific Question:   Pregnant    Answer:   No   Meds ordered this encounter  Medications   azithromycin (ZITHROMAX) 250 MG tablet    Sig: Take 2 tablets (500 mg) on Day 1 followed by 1 tablet  (250 mg) daily until complete.    Dispense:  6 each    Refill:  1    Order Specific Question:   Supervising Provider    Answer:   Unk Pinto [phone]   Notify office for further evaluation and treatment, questions or concerns if s/s fail to improve. The risks and benefits of my recommendations, as well as other treatment options were discussed with the patient today. Questions were answered.  Further disposition pending results of labs. Discussed med's effects and SE's.    Over 15 minutes of exam, counseling, chart review, and critical decision making was performed.   Future Appointments  Date Time Provider [place]  08/23/2022  9:40 AM [facility] 2 [facility]  09/08/2022 10:30 AM Alycia Rossetti, NP [facility] None  11/15/2022 10:00 AM Unk Pinto, MD [facility] None     ------------------------------------------------------------------------------------------------------------------   HPI BP 122/70   Pulse 68   Temp 98 F (36.7 C)   Resp 18   Ht '5\' 3"'$  (1.6 m)   Wt 113 lb (51.3 kg)   SpO2 98%   BMI 20.02 kg/m   71 y.o.female presents for evaluation of cough with sore throat and nasal congestion that started 2 days ago.  She also endorses fatigue, feeling as though she has no energy.  She denies being around any other person that has been sick.  She denies fever and chills. She has been taking Sudafed without much relief.     Past Medical History:  Diagnosis Date   Anxiety and depression    Arthritis    Colon polyps    GERD (gastroesophageal reflux disease)    Hemorrhoids    Hiatal hernia    History of kidney stones    passed   Hyperlipemia    Mixed hyperlipidemia 11/26/2013   Panic disorder    Passive smoke exposure 07/17/2018   Renal insufficiency    stage 3 kidney disease per her PCP   Stomach ulcer    Unspecified essential hypertension 11/26/2013     Allergies  Allergen Reactions   Other     All pain medications: Unknown/ CODEINE   Prednisone Other (See Comments)    DYSPHORIA   Vioxx [Rofecoxib]     unknown   Entex T [Pseudoephedrine-Guaifenesin] Palpitations  Levaquin [Levofloxacin] Rash    Tolerated Avelox without adverse effect 11/08/15.   Sulfa Antibiotics Rash   Trovan [Alatrofloxacin] Rash    Current Outpatient Medications on File Prior to Visit  Medication Sig   ALPRAZolam (XANAX) 0.5 MG tablet Take 1/2  to 1 tablet 2 to 3 x /day as needed for Nerves   Ascorbic Acid (VITAMIN C) 1000 MG tablet Take 1,000 mg by mouth daily.   aspirin 81 MG tablet Take 81 mg by mouth at bedtime.    cholecalciferol (VITAMIN D) 1000 units tablet Take 10,000 Units by mouth daily.   diltiazem (CARDIZEM) 120 MG tablet Take  1 tablet  2 x /day (every 12 hours)  for BP                                      /                 TAKE                            BY                     MOUTH   furosemide (LASIX) 20 MG tablet TAKE 1 TABLET(20 MG) BY MOUTH DAILY AS NEEDED FOR SWELLING   meclizine (ANTIVERT) 25 MG tablet 1/2-1 pill up to 3 times daily for motion sickness/dizziness   potassium chloride SA (KLOR-CON M) 20 MEQ tablet TAKE 1 TABLET BY MOUTH  DAILY FOR POTASSIUM   rosuvastatin (CRESTOR) 20 MG tablet Take  1 tablet  Daily  for Cholesterol   sertraline (ZOLOFT) 100 MG tablet Take 1 tablet (100 mg total) by mouth daily.   triamcinolone cream (KENALOG) 0.5 % APPLY TOPICALLY TO THE AFFECTED AREA TWICE DAILY AS NEEDED FOR ITCHING   vitamin B-12 (CYANOCOBALAMIN) 250 MCG tablet Take 250 mcg by mouth daily.   No current facility-administered medications on file prior to visit.    ROS: all negative except what is noted in the HPI.   Physical Exam:  BP 122/70   Pulse 68   Temp 98 F (36.7 C)   Resp 18   Ht '5\' 3"'$  (1.6 m)   Wt 113 lb (51.3 kg)   SpO2 98%   BMI 20.02 kg/m   General Appearance: NAD.  Awake, conversant and cooperative. Eyes: PERRLA, EOMs intact.  Sclera white.  Conjunctiva without erythema. Sinuses: No frontal/maxillary tenderness.  No nasal discharge. Nares patent.  ENT/Mouth: Ext aud canals clear.  Bilateral TMs w/DOL and without erythema or bulging. Hearing intact.  Posterior pharynx without swelling or exudate.  Tonsils without swelling or erythema.  Neck: Supple.  No masses, nodules or thyromegaly. Respiratory: Effort is regular with non-labored breathing. Breath sounds are equal bilaterally without rales, rhonchi, wheezing or stridor.  Cardio: RRR with no MRGs. Brisk peripheral pulses without edema.  Abdomen: Active BS in all four quadrants.  Soft and non-tender without guarding, rebound tenderness, hernias or masses. Lymphatics: Non tender without lymphadenopathy.  Musculoskeletal: Full ROM, 5/5 strength, normal ambulation.  No clubbing or cyanosis. Skin: Appropriate color for ethnicity. Warm without  rashes, lesions, ecchymosis, ulcers.  Neuro: CN II-XII grossly normal. Normal muscle tone without cerebellar symptoms and intact sensation.   Psych: AO X 3,  appropriate mood and affect, insight and judgment.     Darrol Jump, NP 2:48 PM  [facility]

## 2022-07-14 ENCOUNTER — Telehealth: Payer: Self-pay | Admitting: Nurse Practitioner

## 2022-07-14 NOTE — Telephone Encounter (Signed)
Patient called and states that she has completed to antbiotics you prescribed and she has gotten worse instead of better and has concerns with pneumonia. Will you put order in for chest xray?  (---as I was speaking with her over the phone, she could barely speak without coughing and it sounded horrible)

## 2022-07-14 NOTE — Telephone Encounter (Signed)
Pt called back and states that she had "lost" the second round of abx given to her on 11/7 and has found them in her car. She is wanting to try that and then call back next week if she is not better.

## 2022-07-17 ENCOUNTER — Other Ambulatory Visit: Payer: Self-pay | Admitting: Nurse Practitioner

## 2022-07-17 ENCOUNTER — Ambulatory Visit
Admission: RE | Admit: 2022-07-17 | Discharge: 2022-07-17 | Disposition: A | Discharge status: Home / Self Care | Payer: Medicare Other | Source: Ambulatory Visit | Attending: Nurse Practitioner | Admitting: Nurse Practitioner

## 2022-07-17 ENCOUNTER — Telehealth: Payer: Self-pay | Admitting: Nurse Practitioner

## 2022-07-17 DIAGNOSIS — R0602 Shortness of breath: Secondary | ICD-10-CM

## 2022-07-17 DIAGNOSIS — R059 Cough, unspecified: Secondary | ICD-10-CM | POA: Diagnosis not present

## 2022-07-17 DIAGNOSIS — R053 Chronic cough: Secondary | ICD-10-CM

## 2022-07-17 NOTE — Telephone Encounter (Signed)
Patient states she has been taking her antibiotic but she isn't any better. Still has a really bad cough that has moved into her chest and she is concerned about bronchitis or pneumonia. Please advise.Marland KitchenMarland Kitchen

## 2022-07-17 NOTE — Telephone Encounter (Signed)
Latoya [name] ordered a chest x-ray. Patient aware and is getting ready to go now.

## 2022-07-20 ENCOUNTER — Other Ambulatory Visit: Payer: Self-pay | Admitting: Nurse Practitioner

## 2022-07-20 MED ORDER — DOXYCYCLINE HYCLATE 100 MG PO CAPS: ORAL_CAPSULE | ORAL | 0 refills | Status: DC

## 2022-08-10 ENCOUNTER — Ambulatory Visit (INDEPENDENT_AMBULATORY_CARE_PROVIDER_SITE_OTHER): Payer: Medicare Other | Admitting: Nurse Practitioner

## 2022-08-10 VITALS — BP 138/72 | HR 94 | Temp 97.5°F | Ht 63.0 in | Wt 112.2 lb

## 2022-08-10 DIAGNOSIS — J320 Chronic maxillary sinusitis: Secondary | ICD-10-CM | POA: Diagnosis not present

## 2022-08-10 DIAGNOSIS — J3089 Other allergic rhinitis: Secondary | ICD-10-CM | POA: Diagnosis not present

## 2022-08-10 DIAGNOSIS — R053 Chronic cough: Secondary | ICD-10-CM | POA: Diagnosis not present

## 2022-08-10 MED ORDER — AZITHROMYCIN 500 MG PO TABS: 500.0000 mg | ORAL_TABLET | Freq: Every day | ORAL | 0 refills | Status: AC

## 2022-08-10 NOTE — Patient Instructions (Signed)
Allergic Rhinitis, Adult  Allergic rhinitis is an allergic reaction that affects the mucous membrane inside the nose. The mucous membrane is the tissue that produces mucus. There are two types of allergic rhinitis: Seasonal. This type is also called hay fever and happens only during certain seasons. Perennial. This type can happen at any time of the year. Allergic rhinitis cannot be spread from person to person. This condition can be mild, moderate, or severe. It can develop at any age and may be outgrown. What are the causes? This condition is caused by allergens. These are things that can cause an allergic reaction. Allergens may differ for seasonal allergic rhinitis and perennial allergic rhinitis. Seasonal allergic rhinitis is triggered by pollen. Pollen can come from grasses, trees, and weeds. Perennial allergic rhinitis may be triggered by: Dust mites. Proteins in a pet's urine, saliva, or dander. Dander is dead skin cells from a pet. Smoke, mold, or car fumes. What increases the risk? You are more likely to develop this condition if you have a family history of allergies or other conditions related to allergies, including: Allergic conjunctivitis. This is inflammation of parts of the eyes and eyelids. Asthma. This condition affects the lungs and makes it hard to breathe. Atopic dermatitis or eczema. This is long term (chronic) inflammation of the skin. Food allergies. What are the signs or symptoms? Symptoms of this condition include: Sneezing or coughing. A stuffy nose (nasal congestion), itchy nose, or nasal discharge. Itchy eyes and tearing of the eyes. A feeling of mucus dripping down the back of your throat (postnasal drip). Trouble sleeping. Tiredness or fatigue. Headache. Sore throat. How is this diagnosed? This condition may be diagnosed with your symptoms, medical history, and physical exam. Your health care provider may check for related conditions, such  as: Asthma. Pink eye. This is eye inflammation caused by infection (conjunctivitis). Ear infection. Upper respiratory infection. This is an infection in the nose, throat, or upper airways. You may also have tests to find out which allergens trigger your symptoms. These may include skin tests or blood tests. How is this treated? There is no cure for this condition, but treatment can help control symptoms. Treatment may include: Taking medicines that block allergy symptoms, such as corticosteroids and antihistamines. Medicine may be given as a shot, nasal spray, or pill. Avoiding any allergens. Being exposed again and again to tiny amounts of allergens to help you build a defense against allergens (immunotherapy). This is done if other treatments have not helped. It may include: Allergy shots. These are injected medicines that have small amounts of allergen in them. Sublingual immunotherapy. This involves taking small doses of a medicine with allergen in it under your tongue. If these treatments do not work, your health care provider may prescribe newer, stronger medicines. Follow these instructions at home: Avoiding allergens Find out what you are allergic to and avoid those allergens. These are some things you can do to help avoid allergens: If you have perennial allergies: Replace carpet with wood, tile, or vinyl flooring. Carpet can trap dander and dust. Do not smoke. Do not allow smoking in your home. Change your heating and air conditioning filters at least once a month. If you have seasonal allergies, take these steps during allergy season: Keep windows closed as much as possible. Plan outdoor activities when pollen counts are lowest. Check pollen counts before you plan outdoor activities. When coming indoors, change clothing and shower before sitting on furniture or bedding. If you have a pet   in the house that produces allergens: Keep the pet out of the bedroom. Vacuum, sweep, and  dust regularly. General instructions Take over-the-counter and prescription medicines only as told by your health care provider. Drink enough fluid to keep your urine pale yellow. Keep all follow-up visits as told by your health care provider. This is important. Where to find more information American Academy of Allergy, Asthma & Immunology: www.aaaai.org Contact a health care provider if: You have a fever. You develop a cough that does not go away. You make whistling sounds when you breathe (wheeze). Your symptoms slow you down or stop you from doing your normal activities each day. Get help right away if: You have shortness of breath. This symptom may represent a serious problem that is an emergency. Do not wait to see if the symptom will go away. Get medical help right away. Call your local emergency services (911 in the [place] Do not drive yourself to the hospital. Summary Allergic rhinitis may be managed by taking medicines as directed and avoiding allergens. If you have seasonal allergies, keep windows closed as much as possible during allergy season. Contact your health care provider if you develop a fever or a cough that does not go away. This information is not intended to replace advice given to you by your health care provider. Make sure you discuss any questions you have with your health care provider. Document Revised: 02/02/2022 Document Reviewed: 08/19/2019 Elsevier Patient Education  2023 Elsevier Inc.  

## 2022-08-10 NOTE — Progress Notes (Signed)
Assessment and Plan:  Latoya [name] was seen today for an episodic visit.  Diagnoses and all order for this visit:  1. Chronic maxillary sinusitis Start Zyrtec 10 mg daily - samples provided Stay well hydrated to keep mucus thin and productive.  - azithromycin (ZITHROMAX) 500 MG tablet; Take 1 tablet (500 mg total) by mouth daily for 10 days.  Dispense: 10 tablet; Refill: 0  2. Non-seasonal allergic rhinitis, unspecified trigger Discussed importance of taking daily antihistamine Avoid triggers  3. Chronic cough Stay well hydrated. Continue to monitor    Notify office for further evaluation and treatment, questions or concerns if s/s fail to improve. The risks and benefits of my recommendations, as well as other treatment options were discussed with the patient today. Questions were answered.  Further disposition pending results of labs. Discussed med's effects and SE's.    Over 15 minutes of exam, counseling, chart review, and critical decision making was performed.   Future Appointments  Date Time Provider [place]  08/23/2022  9:40 AM [facility] MM 2 [facility]  09/08/2022 10:30 AM Alycia Rossetti, NP [facility] None  11/15/2022 10:00 AM Unk Pinto, MD [facility] None    ------------------------------------------------------------------------------------------------------------------   HPI BP 138/72   Pulse 94   Temp (!) 97.5 F (36.4 C)   Ht '5\' 3"'$  (1.6 m)   Wt 112 lb 3.2 oz (50.9 kg)   SpO2 98%   BMI 19.88 kg/m   Subjective:     Latoya [name] is a 71 y.o. female who presents for evaluation of chronic sinus problems. Symptoms include allergies, cough, facial pain, facial pressure, post nasal drip, throat clearing, and upper respiratory infection. Patient has had approximately 5 acute sinus infections in the past year. Patient admits to a history of allergic rhinitis. Patient denies chronic use of OTC nasal decongestant sprays. Previous  treatment has included nasal saline sprays/rinses, oral decongestants, and repeated oral antibiotic courses, which have given minimal relief. Patient is a non-smoker.  She has a hx of latent TB.  Completed tx remotely, has seen ID, no further interventions at this time, monitor   She has also followed with ENT in the past with no significant diagnosis.   Past Medical History:  Diagnosis Date   Anxiety and depression    Arthritis    Colon polyps    GERD (gastroesophageal reflux disease)    Hemorrhoids    Hiatal hernia    History of kidney stones    passed   Hyperlipemia    Mixed hyperlipidemia 11/26/2013   Panic disorder    Passive smoke exposure 07/17/2018   Renal insufficiency    stage 3 kidney disease per her PCP   Stomach ulcer    Unspecified essential hypertension 11/26/2013     Allergies  Allergen Reactions   Other     All pain medications: Unknown/ CODEINE   Prednisone Other (See Comments)    DYSPHORIA   Vioxx [Rofecoxib]     unknown   Entex T [Pseudoephedrine-Guaifenesin] Palpitations   Levaquin [Levofloxacin] Rash    Tolerated Avelox without adverse effect 11/08/15.   Sulfa Antibiotics Rash   Trovan [Alatrofloxacin] Rash    Current Outpatient Medications on File Prior to Visit  Medication Sig   ALPRAZolam (XANAX) 0.5 MG tablet Take 1/2  to 1 tablet 2 to 3 x /day as needed for Nerves   Ascorbic Acid (VITAMIN C) 1000 MG tablet Take 1,000 mg by mouth daily.   aspirin 81 MG tablet Take  81 mg by mouth at bedtime.    cholecalciferol (VITAMIN D) 1000 units tablet Take 10,000 Units by mouth daily.   diltiazem (CARDIZEM) 120 MG tablet Take  1 tablet  2 x /day (every 12 hours)  for BP                                      /                 TAKE                           BY                     MOUTH   furosemide (LASIX) 20 MG tablet TAKE 1 TABLET(20 MG) BY MOUTH DAILY AS NEEDED FOR SWELLING   meclizine (ANTIVERT) 25 MG tablet 1/2-1 pill up to 3 times daily for motion  sickness/dizziness   potassium chloride SA (KLOR-CON M) 20 MEQ tablet TAKE 1 TABLET BY MOUTH  DAILY FOR POTASSIUM   rosuvastatin (CRESTOR) 20 MG tablet Take  1 tablet  Daily  for Cholesterol   sertraline (ZOLOFT) 100 MG tablet Take 1 tablet (100 mg total) by mouth daily.   triamcinolone cream (KENALOG) 0.5 % APPLY TOPICALLY TO THE AFFECTED AREA TWICE DAILY AS NEEDED FOR ITCHING   vitamin B-12 (CYANOCOBALAMIN) 250 MCG tablet Take 250 mcg by mouth daily.   doxycycline (VIBRAMYCIN) 100 MG capsule Take 1 capsule 2 x /day with meals for Infection x 1 week   No current facility-administered medications on file prior to visit.    ROS: all negative except what is noted in the HPI.   Physical Exam:  BP 138/72   Pulse 94   Temp (!) 97.5 F (36.4 C)   Ht '5\' 3"'$  (1.6 m)   Wt 112 lb 3.2 oz (50.9 kg)   SpO2 98%   BMI 19.88 kg/m   General Appearance: NAD.  Awake, conversant and cooperative. Eyes: PERRLA, EOMs intact.  Sclera white.  Conjunctiva without erythema. Sinuses: No frontal/maxillary tenderness.  No nasal discharge. Nares patent.  ENT/Mouth: Ext aud canals clear.  Bilateral TMs w/DOL and without erythema or bulging. Hearing intact.  Posterior pharynx without swelling or exudate.  Tonsils without swelling or erythema.  Neck: Supple.  No masses, nodules or thyromegaly. Respiratory: Effort is regular with non-labored breathing. Breath sounds are equal bilaterally without rales, rhonchi, wheezing or stridor.  Cardio: RRR with no MRGs. Brisk peripheral pulses without edema.  Abdomen: Active BS in all four quadrants.  Soft and non-tender without guarding, rebound tenderness, hernias or masses. Lymphatics: Non tender without lymphadenopathy.  Musculoskeletal: Full ROM, 5/5 strength, normal ambulation.  No clubbing or cyanosis. Skin: Appropriate color for ethnicity. Warm without rashes, lesions, ecchymosis, ulcers.  Neuro: CN II-XII grossly normal. Normal muscle tone without cerebellar symptoms  and intact sensation.   Psych: AO X 3,  appropriate mood and affect, insight and judgment.     Darrol Jump, NP 3:14 PM [facility]

## 2022-08-16 ENCOUNTER — Encounter: Payer: Self-pay | Admitting: Internal Medicine

## 2022-08-23 ENCOUNTER — Ambulatory Visit: Payer: Medicare Other

## 2022-09-06 NOTE — Progress Notes (Signed)
3 month follow up  Assessment and Plan:    Essential hypertension - continue medications, DASH diet, exercise and monitor at home. Call if greater than 130/80.  -     CBC with Differential/Platelet -     CMP/GFR -     TSH   Adult BMI < 19 Focus on high caloric and protein dense foods.   Mixed hyperlipidemia -continue medications, check lipids, decrease fatty foods, increase activity.  -     Lipid panel  Aortic atherosclerosis ([place]) - per CT 02/2015 Control blood pressure, cholesterol, glucose, increase exercise.   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 Cruzita Lederer is now following annually  -Recommended 5 years of annual thyroid US  CKD (chronic kidney disease) stage 3, GFR 30-59 ml/min (HCC) Increase fluids, avoid NSAIDS, monitor sugars, will monitor  Other abnormal glucose Discussed general issues about diabetes pathophysiology and management., Educational material distributed., Suggested low cholesterol diet., Encouraged aerobic exercise., Discussed foot care., Reminded to get yearly retinal exam. - A1C annually   Mild protein calorie malnutrition Encourage high caloric, high protein foods At least 2 boost or ensure daily Follow up in 4 weeks to recheck  Generalized anxiety disorder Continue medications; doing well limiting benzo  stress management techniques discussed, increase water, good sleep hygiene discussed, increase exercise, and increase veggies.   Vitamin D deficiency continue to recommend supplementation for goal of 50-100 Check vitamin D level annually and as needed  Eustachian tube dysfunction Mucinex DM every 8 hours as needed Take generic Zyrtec of allegra daily   Over 30 minutes of face to face exam, counseling, chart review and critical decision making was performed Future Appointments  Date Time Provider [place]  10/23/2022 12:40 PM [facility] 2 [facility]  11/15/2022 10:00 AM  Unk Pinto, MD [facility] None      Subjective:  Latoya [name] is a 72 y.o. female who presents for CPE and follow up. She has Esophageal reflux; Generalized anxiety disorder; Chronic cough; Essential hypertension; Hyperlipidemia, mixed; Abnormal glucose (prediabetes); Vitamin D deficiency; Medication management; Aortic atherosclerosis ([place]) by Abd CT scan 02/24/2015; Hiatal hernia; Chronic rhinitis; Laryngopharyngeal reflux (LPR); Multiple lentigines syndrome; FHx: heart disease; Osteoporosis; Recurrent acute serous otitis media of right ear; History of eustachian tube dysfunction; TB lung, latent; Tenderness of both temporomandibular joints; Multiple thyroid nodules; H/O thyroid disease; and CKD (chronic kidney disease) stage 2, GFR 68 ml/min on their problem list.    Husband passed in 2021 with late stage lung disease, has 2 grown daughter. No concerns this year.   She is having some head congestion, no nasal drainage, no cough.   She  has a diagnosis of depression/anxiety and is currently on zoloft 100 mg daily, xanax 0.5 mg PRN, use varies, avoids taking daily.  She feels doing better recently. Last refill Alprazolam 0.31m #60 02/03/22.   She has GERD/LPR, had EGD 10/2017, on pepcid 40 mg daily per Dr. GCarlean Purl she also has dx of chronic cough syndrome per Dr. WMelvyn Novasafter extensive workup for persistent cough. [facility] DP[facility]ro Cassis[facility]/2020 due to recurrent serous otitis and ear complaints; he diagnosed with eustachian tube dysfunction. He recommended she utilize Flonase and astalin nasal sprays PRN. She admits hasn't been using allergy meds, but does have at home to start if sx are bothersome.   BMI is Body mass index is 18.81 kg/m., she is very active around yard.  Wt Readings from Last  3 Encounters:  09/08/22 106 lb 3.2 oz (48.2 kg)  08/10/22 112 lb 3.2 oz (50.9 kg)  07/11/22 113 lb (51.3 kg)   Her blood pressure has been controlled at home,  Currently on diliazem 120 mg BID, today their BP is BP: 138/80  BP Readings from Last 3 Encounters:  09/08/22 138/80  08/10/22 138/72  07/11/22 122/70   She does workout, walking, yard work, house work. She denies chest pain, shortness of breath, dizziness.  She has aortic atherosclerosis per CT 02/2015.    She is on cholesterol medication (Rosuvastatin 20 mg daily) and denies myalgias. Her cholesterol is at LDL goal. The cholesterol last visit was:   Lab Results  Component Value Date   CHOL 129 06/07/2022   HDL 53 06/07/2022   LDLCALC 54 06/07/2022   TRIG 138 06/07/2022   CHOLHDL 2.4 06/07/2022    She has been working on diet and exercise for glucose management, and denies paresthesia of the feet, polydipsia, polyuria and visual disturbances. Last A1C in the office was:  Lab Results  Component Value Date   HGBA1C 5.2 02/22/2022   Has had CKD IIIa range predating 2015 per lab review, intermittently improves with pushed fluids, generally admits to poor water intake. Last GFR: Lab Results  Component Value Date   EGFR 60 06/07/2022   Patient is on Vitamin D supplement for deficency.   Lab Results  Component Value Date   VD25OH 48 02/22/2022     She has multinodular thyroid, last [place] 11/2020, now following with Dr. Cruzita Lederer, planning annual [place] follow up~ 5 years.  Recent TSHs have been normal.  Lab Results  Component Value Date   TSH 2.59 02/22/2022     Medication Review: Current Outpatient Medications on File Prior to Visit  Medication Sig   ALPRAZolam (XANAX) 0.5 MG tablet Take 1/2  to 1 tablet 2 to 3 x /day as needed for Nerves   Ascorbic Acid (VITAMIN C) 1000 MG tablet Take 1,000 mg by mouth daily.   aspirin 81 MG tablet Take 81 mg by mouth at bedtime.    cholecalciferol (VITAMIN D) 1000 units tablet Take 10,000 Units by mouth daily.   diltiazem (CARDIZEM) 120 MG tablet Take  1 tablet  2 x /day (every 12 hours)  for BP                                      /                  TAKE                           BY                     MOUTH   doxycycline (VIBRAMYCIN) 100 MG capsule Take 1 capsule 2 x /day with meals for Infection x 1 week   furosemide (LASIX) 20 MG tablet TAKE 1 TABLET(20 MG) BY MOUTH DAILY AS NEEDED FOR SWELLING   meclizine (ANTIVERT) 25 MG tablet 1/2-1 pill up to 3 times daily for motion sickness/dizziness   potassium chloride SA (KLOR-CON M) 20 MEQ tablet TAKE 1 TABLET BY MOUTH  DAILY FOR POTASSIUM   rosuvastatin (CRESTOR) 20 MG tablet Take  1 tablet  Daily  for Cholesterol   sertraline (ZOLOFT) 100 MG tablet Take 1 tablet (100 mg  total) by mouth daily.   triamcinolone cream (KENALOG) 0.5 % APPLY TOPICALLY TO THE AFFECTED AREA TWICE DAILY AS NEEDED FOR ITCHING   vitamin B-12 (CYANOCOBALAMIN) 250 MCG tablet Take 250 mcg by mouth daily.   No current facility-administered medications on file prior to visit.     Allergies  Allergen Reactions   Other     All pain medications: Unknown/ CODEINE   Prednisone Other (See Comments)    DYSPHORIA   Vioxx [Rofecoxib]     unknown   Entex T [Pseudoephedrine-Guaifenesin] Palpitations   Levaquin [Levofloxacin] Rash    Tolerated Avelox without adverse effect 11/08/15.   Sulfa Antibiotics Rash   Trovan [Alatrofloxacin] Rash    Current Problems (verified) Patient Active Problem List   Diagnosis Date Noted   CKD (chronic kidney disease) stage 2, GFR 68 ml/min 03/02/2022   Multiple thyroid nodules 05/13/2021   H/O thyroid disease 05/13/2021   Tenderness of both temporomandibular joints 10/13/2020   TB lung, latent 06/21/2020   Recurrent acute serous otitis media of right ear 05/19/2020   History of eustachian tube dysfunction 05/19/2020   Osteoporosis 06/23/2019   FHx: heart disease 11/27/2017   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   Laryngopharyngeal reflux (LPR) 10/12/2016   Chronic rhinitis 10/09/2016   Hiatal hernia 07/14/2016   Aortic atherosclerosis ([place]) by Abd CT scan 02/24/2015 06/30/2016    Abnormal glucose (prediabetes) 05/01/2014   Vitamin D deficiency 05/01/2014   Medication management 05/01/2014   Essential hypertension 11/26/2013   Hyperlipidemia, mixed 11/26/2013   Chronic cough 09/19/2013   Generalized anxiety disorder 07/11/2013   Esophageal reflux 11/29/2012    Health Maintenance:   Immunization History  Administered Date(s) Administered   Influenza Split 06/04/2014, 06/05/2015   Influenza, High Dose Seasonal PF 05/30/2017, 05/22/2018, 05/21/2019, 05/26/2021, 06/06/2022   Influenza-Unspecified 06/06/2013, 06/13/2016   Pneumococcal Conjugate-13 06/05/2015   Pneumococcal Polysaccharide-23 04/06/2014   Pneumococcal-Unspecified 09/04/1996, 09/04/2016   Td 09/04/2005   Tdap 03/22/2017   Health Maintenance  Topic Date Due   Medicare Annual Wellness (AWV)  Never done   COVID-19 Vaccine (1) Never done   Zoster Vaccines- Shingrix (1 of 2) Never done   MAMMOGRAM  06/23/2022   COLONOSCOPY (Pts 45-54yr Insurance coverage will need to be confirmed)  12/13/2022   DEXA SCAN  06/24/2023   DTaP/Tdap/Td (3 - Td or Tdap) 03/23/2027   INFLUENZA VACCINE  Completed   Hepatitis C Screening  Completed   HPV VACCINES  Aged Out   Pneumonia Vaccine 72 Years old  Discontinued   Last colonoscopy: 12/2012 GCarlean Purl 10 year follow up EGD 10/2017  Last mammogram: breast center DEXA: 06/2019 osteoporosis, T -2.7, fosamax was prescribed but never started; DEXA 06/2021 R fem T -2.9, fosamax and resent fosamax    [place]oreathyroid 11/19/2020, multinodular thyroid, Dr. GCruzita Lederernow following with annual [place] Shingles/Zostavax: check with insurance  Requested covid 19 vaccine dates  Names of Other Physician/Practitioners you currently use: 1. [facility] here for primary care 2. [facility], eye doctor, last visit 12/2020 3. [facility], last visit 2020, Due, encouraged to schedule   Patient Care Team: MUnk Pinto MD as PCP - General  (Internal Medicine) RAquilla Hacker MD as Referring Physician (Psychiatry) CJannette Spanner MD as Referring Physician (Dermatology) [facility]Carleene Mains [facility] (Pharmacist)  SURGICAL HISTORY She  has a past surgical history that includes Abdominal hysterectomy; Tubal ligation; Foot surgery (Left, 2008); Colonoscopy; Upper gastrointestinal endoscopy; Oophorectomy (Right); Carpometacarpel (CMC) suspension  plasty (Right, 12/08/2013); I & D extremity (Right, 01/20/2017); and Hand surgery (Right). FAMILY HISTORY Her family history includes Asthma in her mother; CVA in her brother; Dementia in her brother; Heart Problems in her maternal grandfather; Heart attack in her brother; Heart disease in her brother and father; Other in her brother; Pancreatic cancer in her brother; Tuberculosis in her daughter. SOCIAL HISTORY She  reports that she has never smoked. She has been exposed to tobacco smoke. She has never used smokeless tobacco. She reports that she does not drink alcohol and does not use drugs.    Review of Systems  Constitutional:  Negative for malaise/fatigue and weight loss.  HENT:  Positive for congestion. Negative for hearing loss and tinnitus.   Eyes:  Negative for blurred vision and double vision.  Respiratory:  Negative for cough, shortness of breath and wheezing.   Cardiovascular:  Negative for chest pain, palpitations, orthopnea, claudication and leg swelling.  Gastrointestinal:  Negative for abdominal pain, blood in stool, constipation, diarrhea, heartburn, melena, nausea and vomiting.  Genitourinary: Negative.   Musculoskeletal:  Negative for joint pain and myalgias.  Skin:  Negative for rash.  Neurological:  Negative for dizziness, tingling, sensory change, weakness and headaches.  Endo/Heme/Allergies:  Positive for environmental allergies (mild). Negative for polydipsia.  Psychiatric/Behavioral: Negative.  Negative for depression (improved with meds), memory loss,  substance abuse and suicidal ideas. The patient is not nervous/anxious and does not have insomnia.   All other systems reviewed and are negative.   Objective:     Today's Vitals   09/08/22 1046  BP: 138/80  Pulse: 91  Resp: 16  Temp: 97.9 F (36.6 C)  SpO2: 99%  Weight: 106 lb 3.2 oz (48.2 kg)  Height: _0  (1.6 m)   Body mass index is 18.81 kg/m.  General appearance: Very thin pleasant female HEENT: normocephalic, sclerae anicteric, TMs pearly, nares patent, no discharge or erythema, pharynx normal Oral cavity: MMM, no lesions. Poor dentition Neck: supple, no lymphadenopathy, no thyromegaly, no masses Heart: RRR, normal S1, S2, no murmurs Lungs: CTA bilaterally, no wheezes, rhonchi, or rales Abdomen: +bs, soft, non-tender, non distended, no masses, no hepatomegaly,  Splenomegaly noted.   Musculoskeletal: Full ROM all extremities, nontender, no swelling, no obvious deformity. Non-antalgic gait.  Extremities: no edema, no cyanosis, no clubbing Pulses: 2+ symmetric, upper and lower extremities, normal cap refill Neurological: alert, oriented x 3, CN2-12 intact, strength normal upper extremities and lower extremities, sensation normal throughout, DTRs 2+  no cerebellar signs. Psychiatric: Normal affect, behavior normal, pleasant       Latoya Govea Mikki Santee, NP 10:47 AM [facility]

## 2022-09-08 ENCOUNTER — Ambulatory Visit (INDEPENDENT_AMBULATORY_CARE_PROVIDER_SITE_OTHER): Payer: Medicare Other | Admitting: Nurse Practitioner

## 2022-09-08 ENCOUNTER — Encounter: Payer: Self-pay | Admitting: Nurse Practitioner

## 2022-09-08 VITALS — BP 138/80 | HR 91 | Temp 97.9°F | Resp 16 | Ht 63.0 in | Wt 106.2 lb

## 2022-09-08 DIAGNOSIS — D692 Other nonthrombocytopenic purpura: Secondary | ICD-10-CM | POA: Diagnosis not present

## 2022-09-08 DIAGNOSIS — Q8789 Other specified congenital malformation syndromes, not elsewhere classified: Secondary | ICD-10-CM | POA: Diagnosis not present

## 2022-09-08 DIAGNOSIS — R7309 Other abnormal glucose: Secondary | ICD-10-CM

## 2022-09-08 DIAGNOSIS — E441 Mild protein-calorie malnutrition: Secondary | ICD-10-CM

## 2022-09-08 DIAGNOSIS — I10 Essential (primary) hypertension: Secondary | ICD-10-CM | POA: Diagnosis not present

## 2022-09-08 DIAGNOSIS — N1831 Chronic kidney disease, stage 3a: Secondary | ICD-10-CM | POA: Diagnosis not present

## 2022-09-08 DIAGNOSIS — Z79899 Other long term (current) drug therapy: Secondary | ICD-10-CM | POA: Diagnosis not present

## 2022-09-08 DIAGNOSIS — E876 Hypokalemia: Secondary | ICD-10-CM | POA: Diagnosis not present

## 2022-09-08 DIAGNOSIS — E782 Mixed hyperlipidemia: Secondary | ICD-10-CM

## 2022-09-08 DIAGNOSIS — L814 Other melanin hyperpigmentation: Secondary | ICD-10-CM

## 2022-09-08 DIAGNOSIS — E559 Vitamin D deficiency, unspecified: Secondary | ICD-10-CM | POA: Diagnosis not present

## 2022-09-08 DIAGNOSIS — I700 Atherosclerosis of aorta: Secondary | ICD-10-CM

## 2022-09-08 DIAGNOSIS — H6993 Unspecified Eustachian tube disorder, bilateral: Secondary | ICD-10-CM

## 2022-09-08 DIAGNOSIS — F411 Generalized anxiety disorder: Secondary | ICD-10-CM

## 2022-09-08 NOTE — Patient Instructions (Addendum)
Mucinex DM every 8 hours as needed Take generic Zyrtec or allegra daily  Eustachian Tube Dysfunction  Eustachian tube dysfunction refers to a condition in which a blockage develops in the narrow passage that connects the middle ear to the back of the nose (eustachian tube). The eustachian tube regulates air pressure in the middle ear by letting air move between the ear and nose. It also helps to drain fluid from the middle ear space. Eustachian tube dysfunction can affect one or both ears. When the eustachian tube does not function properly, air pressure, fluid, or both can build up in the middle ear. What are the causes? This condition occurs when the eustachian tube becomes blocked or cannot open normally. Common causes of this condition include: Ear infections. Colds and other infections that affect the nose, mouth, and throat (upper respiratory tract). Allergies. Irritation from cigarette smoke. Irritation from stomach acid coming up into the esophagus (gastroesophageal reflux). The esophagus is the part of the body that moves food from the mouth to the stomach. Sudden changes in air pressure, such as from descending in an airplane or scuba diving. Abnormal growths in the nose or throat, such as: Growths that line the nose (nasal polyps). Abnormal growth of cells (tumors). Enlarged tissue at the back of the throat (adenoids). What increases the risk? You are more likely to develop this condition if: You smoke. You are overweight. You are a child who has: Certain birth defects of the mouth, such as cleft palate. Large tonsils or adenoids. What are the signs or symptoms? Common symptoms of this condition include: A feeling of fullness in the ear. Ear pain. Clicking or popping noises in the ear. Ringing in the ear (tinnitus). Hearing loss. Loss of balance. Dizziness. Symptoms may get worse when the air pressure around you changes, such as when you travel to an area of high  elevation, fly on an airplane, or go scuba diving. How is this diagnosed? This condition may be diagnosed based on: Your symptoms. A physical exam of your ears, nose, and throat. Tests, such as those that measure: The movement of your eardrum. Your hearing (audiometry). How is this treated? Treatment depends on the cause and severity of your condition. In mild cases, you may relieve your symptoms by moving air into your ears. This is called "popping the ears." In more severe cases, or if you have symptoms of fluid in your ears, treatment may include: Medicines to relieve congestion (decongestants). Medicines that treat allergies (antihistamines). Nasal sprays or ear drops that contain medicines that reduce swelling (steroids). A procedure to drain the fluid in your eardrum. In this procedure, a small tube may be placed in the eardrum to: Drain the fluid. Restore the air in the middle ear space. A procedure to insert a balloon device through the nose to inflate the opening of the eustachian tube (balloon dilation). Follow these instructions at home: Lifestyle Do not do any of the following until your health care provider approves: Travel to high altitudes. Fly in airplanes. Work in a Pension scheme manager or room. Scuba dive. Do not use any products that contain nicotine or tobacco. These products include cigarettes, chewing tobacco, and vaping devices, such as e-cigarettes. If you need help quitting, ask your health care provider. Keep your ears dry. Wear fitted earplugs during showering and bathing. Dry your ears completely after. General instructions Take over-the-counter and prescription medicines only as told by your health care provider. Use techniques to help pop your ears as recommended  by your health care provider. These may include: Chewing gum. Yawning. Frequent, forceful swallowing. Closing your mouth, holding your nose closed, and gently blowing as if you are trying to blow  air out of your nose. Keep all follow-up visits. This is important. Contact a health care provider if: Your symptoms do not go away after treatment. Your symptoms come back after treatment. You are unable to pop your ears. You have: A fever. Pain in your ear. Pain in your head or neck. Fluid draining from your ear. Your hearing suddenly changes. You become very dizzy. You lose your balance. Get help right away if: You have a sudden, severe increase in any of your symptoms. Summary Eustachian tube dysfunction refers to a condition in which a blockage develops in the eustachian tube. It can be caused by ear infections, allergies, inhaled irritants, or abnormal growths in the nose or throat. Symptoms may include ear pain or fullness, hearing loss, or ringing in the ears. Mild cases are treated with techniques to unblock the ears, such as yawning or chewing gum. More severe cases are treated with medicines or procedures. This information is not intended to replace advice given to you by your health care provider. Make sure you discuss any questions you have with your health care provider. Document Revised: 11/01/2020 Document Reviewed: 11/01/2020 Elsevier Patient Education  [place].

## 2022-09-09 LAB — COMPLETE METABOLIC PANEL WITH GFR
AG Ratio: 2.1 (calc) (ref 1.0–2.5)
ALT: 17 U/L (ref 6–29)
AST: 29 U/L (ref 10–35)
Albumin: 4 g/dL (ref 3.6–5.1)
Alkaline phosphatase (APISO): 64 U/L (ref 37–153)
BUN/Creatinine Ratio: 13 (calc) (ref 6–22)
BUN: 16 mg/dL (ref 7–25)
CO2: 25 mmol/L (ref 20–32)
Calcium: 9.2 mg/dL (ref 8.6–10.4)
Chloride: 110 mmol/L (ref 98–110)
Creat: 1.2 mg/dL — ABNORMAL HIGH (ref 0.60–1.00)
Globulin: 1.9 g/dL (calc) (ref 1.9–3.7)
Glucose, Bld: 77 mg/dL (ref 65–99)
Potassium: 3.7 mmol/L (ref 3.5–5.3)
Sodium: 145 mmol/L (ref 135–146)
Total Bilirubin: 0.3 mg/dL (ref 0.2–1.2)
Total Protein: 5.9 g/dL — ABNORMAL LOW (ref 6.1–8.1)
eGFR: 48 mL/min/{1.73_m2} — ABNORMAL LOW (ref >=60)

## 2022-09-09 LAB — CBC WITH DIFFERENTIAL/PLATELET
Absolute Monocytes: 437 cells/uL (ref 200–950)
Basophils Absolute: 48 cells/uL (ref 0–200)
Basophils Relative: 1 %
Eosinophils Absolute: 139 cells/uL (ref 15–500)
Eosinophils Relative: 2.9 %
HCT: 36.7 % (ref 35.0–45.0)
Hemoglobin: 12.1 g/dL (ref 11.7–15.5)
Lymphs Abs: 1430 cells/uL (ref 850–3900)
MCH: 29.4 pg (ref 27.0–33.0)
MCHC: 33 g/dL (ref 32.0–36.0)
MCV: 89.3 fL (ref 80.0–100.0)
MPV: 10.4 fL (ref 7.5–12.5)
Monocytes Relative: 9.1 %
Neutro Abs: 2746 cells/uL (ref 1500–7800)
Neutrophils Relative %: 57.2 %
Platelets: 175 10*3/uL (ref 140–400)
RBC: 4.11 10*6/uL (ref 3.80–5.10)
RDW: 12.8 % (ref 11.0–15.0)
Total Lymphocyte: 29.8 %
WBC: 4.8 10*3/uL (ref 3.8–10.8)

## 2022-09-09 LAB — LIPID PANEL
Cholesterol: 136 mg/dL (ref <200)
HDL: 58 mg/dL (ref >=50)
LDL Cholesterol (Calc): 60 mg/dL (calc)
Non-HDL Cholesterol (Calc): 78 mg/dL (calc) (ref <130)
Total CHOL/HDL Ratio: 2.3 (calc) (ref <5.0)
Triglycerides: 94 mg/dL (ref <150)

## 2022-09-09 LAB — TSH: TSH: 2.18 mIU/L (ref 0.40–4.50)

## 2022-09-26 NOTE — Progress Notes (Deleted)
Assessment and Plan:  There are no diagnoses linked to this encounter.    Further disposition pending results of labs. Discussed med's effects and SE's.   Over 30 minutes of exam, counseling, chart review, and critical decision making was performed.   Future Appointments  Date Time Provider [place]  09/27/2022  9:00 AM Alycia Rossetti, NP [facility] None  09/28/2022 12:00 PM [facility] MM 2 [facility]  10/06/2022  9:30 AM Alycia Rossetti, NP [facility] None  11/15/2022 10:00 AM Unk Pinto, MD [facility] None    ------------------------------------------------------------------------------------------------------------------   HPI There were no vitals taken for this visit. 72 y.o.female presents for  Past Medical History:  Diagnosis Date   Anxiety and depression    Arthritis    Colon polyps    GERD (gastroesophageal reflux disease)    Hemorrhoids    Hiatal hernia    History of kidney stones    passed   Hyperlipemia    Mixed hyperlipidemia 11/26/2013   Panic disorder    Passive smoke exposure 07/17/2018   Renal insufficiency    stage 3 kidney disease per her PCP   Stomach ulcer    Unspecified essential hypertension 11/26/2013     Allergies  Allergen Reactions   Other     All pain medications: Unknown/ CODEINE   Prednisone Other (See Comments)    DYSPHORIA   Vioxx [Rofecoxib]     unknown   Entex T [Pseudoephedrine-Guaifenesin] Palpitations   Levaquin [Levofloxacin] Rash    Tolerated Avelox without adverse effect 11/08/15.   Sulfa Antibiotics Rash   Trovan [Alatrofloxacin] Rash    Current Outpatient Medications on File Prior to Visit  Medication Sig   ALPRAZolam (XANAX) 0.5 MG tablet Take 1/2  to 1 tablet 2 to 3 x /day as needed for Nerves   Ascorbic Acid (VITAMIN C) 1000 MG tablet Take 1,000 mg by mouth daily.   aspirin 81 MG tablet Take 81 mg by mouth at bedtime.    cholecalciferol (VITAMIN D) 1000 units tablet Take 10,000 Units by mouth  daily.   diltiazem (CARDIZEM) 120 MG tablet Take  1 tablet  2 x /day (every 12 hours)  for BP                                      /                 TAKE                           BY                     MOUTH   doxycycline (VIBRAMYCIN) 100 MG capsule Take 1 capsule 2 x /day with meals for Infection x 1 week   furosemide (LASIX) 20 MG tablet TAKE 1 TABLET(20 MG) BY MOUTH DAILY AS NEEDED FOR SWELLING   meclizine (ANTIVERT) 25 MG tablet 1/2-1 pill up to 3 times daily for motion sickness/dizziness   potassium chloride SA (KLOR-CON M) 20 MEQ tablet TAKE 1 TABLET BY MOUTH  DAILY FOR POTASSIUM   rosuvastatin (CRESTOR) 20 MG tablet Take  1 tablet  Daily  for Cholesterol   sertraline (ZOLOFT) 100 MG tablet Take 1 tablet (100 mg total) by mouth daily.   triamcinolone cream (KENALOG) 0.5 % APPLY TOPICALLY TO THE AFFECTED AREA TWICE DAILY AS  NEEDED FOR ITCHING   vitamin B-12 (CYANOCOBALAMIN) 250 MCG tablet Take 250 mcg by mouth daily.   No current facility-administered medications on file prior to visit.    ROS: all negative except above.   Physical Exam:  There were no vitals taken for this visit.  General Appearance: Well nourished, in no apparent distress. Eyes: PERRLA, EOMs, conjunctiva no swelling or erythema Sinuses: No Frontal/maxillary tenderness ENT/Mouth: Ext aud canals clear, TMs without erythema, bulging. No erythema, swelling, or exudate on post pharynx.  Tonsils not swollen or erythematous. Hearing normal.  Neck: Supple, thyroid normal.  Respiratory: Respiratory effort normal, BS equal bilaterally without rales, rhonchi, wheezing or stridor.  Cardio: RRR with no MRGs. Brisk peripheral pulses without edema.  Abdomen: Soft, + BS.  Non tender, no guarding, rebound, hernias, masses. Lymphatics: Non tender without lymphadenopathy.  Musculoskeletal: Full ROM, 5/5 strength, normal gait.  Skin: Warm, dry without rashes, lesions, ecchymosis.  Neuro: Cranial nerves intact. Normal muscle tone,  no cerebellar symptoms. Sensation intact.  Psych: Awake and oriented X 3, normal affect, Insight and Judgment appropriate.     Alycia Rossetti, NP 3:20 PM [facility]

## 2022-09-27 ENCOUNTER — Encounter: Payer: Self-pay | Admitting: Nurse Practitioner

## 2022-09-27 ENCOUNTER — Ambulatory Visit (INDEPENDENT_AMBULATORY_CARE_PROVIDER_SITE_OTHER): Payer: Medicare Other | Admitting: Nurse Practitioner

## 2022-09-27 ENCOUNTER — Ambulatory Visit: Payer: Medicare Other | Admitting: Nurse Practitioner

## 2022-09-27 ENCOUNTER — Telehealth: Payer: Self-pay | Admitting: Nurse Practitioner

## 2022-09-27 ENCOUNTER — Ambulatory Visit (HOSPITAL_COMMUNITY)
Admission: RE | Admit: 2022-09-27 | Discharge: 2022-09-27 | Disposition: A | Discharge status: Home / Self Care | Payer: Medicare Other | Source: Ambulatory Visit | Attending: Cardiovascular Disease | Admitting: Cardiovascular Disease

## 2022-09-27 VITALS — BP 124/78 | HR 76 | Temp 98.1°F | Ht 63.0 in | Wt 106.8 lb

## 2022-09-27 DIAGNOSIS — S81811A Laceration without foreign body, right lower leg, initial encounter: Secondary | ICD-10-CM

## 2022-09-27 DIAGNOSIS — S81812A Laceration without foreign body, left lower leg, initial encounter: Secondary | ICD-10-CM | POA: Diagnosis not present

## 2022-09-27 DIAGNOSIS — M7989 Other specified soft tissue disorders: Secondary | ICD-10-CM | POA: Insufficient documentation

## 2022-09-27 DIAGNOSIS — N1831 Chronic kidney disease, stage 3a: Secondary | ICD-10-CM | POA: Diagnosis not present

## 2022-09-27 MED ORDER — APIXABAN 5 MG PO TABS: 5.0000 mg | ORAL_TABLET | Freq: Two times a day (BID) | ORAL | 0 refills | Status: DC

## 2022-09-27 NOTE — Progress Notes (Signed)
Assessment and Plan:  Diagnoses and all orders for this visit:  Stage 3a chronic kidney disease (HCC) Increase fluids, avoid NSAIDS, monitor sugars, will monitor -     BASIC METABOLIC PANEL WITH GFR -     CBC with Differential/Platelet  Swelling of left lower extremity Stat doppler is ordered and pt is started on Eliquis 5 mg BID, will treat further pending results -     VAS [place] LOWER EXTREMITY VENOUS (DVT); Future   Skin tear of right lower leg without complication, initial encounter/Skin tear of left lower leg without complication, initial encounter Areas were dressed with peroxide  betadine and tegaderm Monitor for S/S of infection Follow up in office in 5 days  - dressing    Further disposition pending results of labs. Discussed med's effects and SE's.   Over 45 minutes of exam, counseling, chart review, and critical decision making was performed.   Future Appointments  Date Time Provider [place]  10/06/2022  9:30 AM Alycia Rossetti, NP [facility] None  11/15/2022 10:00 AM Unk Pinto, MD [facility] None    ------------------------------------------------------------------------------------------------------------------   HPI BP 124/78   Pulse 76   Temp 98.1 F (36.7 C)   Ht '5\' 3"'$  (1.6 m)   Wt 106 lb 12.8 oz (48.4 kg)   SpO2 94%   BMI 18.92 kg/m   72 y.o.female presents for fall 3 days ago when walking dog and he pulled her  She fell onto knees. Developed a skin tear of right knee and left shin.  Left lower leg is swollen and tender to the touch.    BMI is Body mass index is 18.92 kg/m., she has been working on diet and exercise. Trying to drink at least 1 ensure a day.  Wt Readings from Last 3 Encounters:  09/27/22 106 lb 12.8 oz (48.4 kg)  09/08/22 106 lb 3.2 oz (48.2 kg)  08/10/22 112 lb 3.2 oz (50.9 kg)   She has decreased kidney function and is trying to drink more fluids Limits use of torsemide Lab Results  Component Value Date   EGFR  48 (L) 09/08/2022    Past Medical History:  Diagnosis Date   Anxiety and depression    Arthritis    Colon polyps    GERD (gastroesophageal reflux disease)    Hemorrhoids    Hiatal hernia    History of kidney stones    passed   Hyperlipemia    Mixed hyperlipidemia 11/26/2013   Panic disorder    Passive smoke exposure 07/17/2018   Renal insufficiency    stage 3 kidney disease per her PCP   Stomach ulcer    Unspecified essential hypertension 11/26/2013     Allergies  Allergen Reactions   Other     All pain medications: Unknown/ CODEINE   Prednisone Other (See Comments)    DYSPHORIA   Vioxx [Rofecoxib]     unknown   Entex T [Pseudoephedrine-Guaifenesin] Palpitations   Levaquin [Levofloxacin] Rash    Tolerated Avelox without adverse effect 11/08/15.   Sulfa Antibiotics Rash   Trovan [Alatrofloxacin] Rash    Current Outpatient Medications on File Prior to Visit  Medication Sig   ALPRAZolam (XANAX) 0.5 MG tablet Take 1/2  to 1 tablet 2 to 3 x /day as needed for Nerves   aspirin 81 MG tablet Take 81 mg by mouth at bedtime.    cholecalciferol (VITAMIN D) 1000 units tablet Take 10,000 Units by mouth daily.   diltiazem (CARDIZEM) 120 MG tablet Take  1 tablet  2 x /day (every 12 hours)  for BP                                      /                 TAKE                           BY                     MOUTH   furosemide (LASIX) 20 MG tablet TAKE 1 TABLET(20 MG) BY MOUTH DAILY AS NEEDED FOR SWELLING   potassium chloride SA (KLOR-CON M) 20 MEQ tablet TAKE 1 TABLET BY MOUTH  DAILY FOR POTASSIUM   rosuvastatin (CRESTOR) 20 MG tablet Take  1 tablet  Daily  for Cholesterol   sertraline (ZOLOFT) 100 MG tablet Take 1 tablet (100 mg total) by mouth daily.   torsemide (DEMADEX) 20 MG tablet Take 20 mg by mouth every morning.   vitamin B-12 (CYANOCOBALAMIN) 250 MCG tablet Take 250 mcg by mouth daily.   Ascorbic Acid (VITAMIN C) 1000 MG tablet Take 1,000 mg by mouth daily.   doxycycline  (VIBRAMYCIN) 100 MG capsule Take 1 capsule 2 x /day with meals for Infection x 1 week (Patient not taking: Reported on 09/27/2022)   meclizine (ANTIVERT) 25 MG tablet 1/2-1 pill up to 3 times daily for motion sickness/dizziness (Patient not taking: Reported on 09/27/2022)   triamcinolone cream (KENALOG) 0.5 % APPLY TOPICALLY TO THE AFFECTED AREA TWICE DAILY AS NEEDED FOR ITCHING (Patient not taking: Reported on 09/27/2022)   No current facility-administered medications on file prior to visit.    Review of Systems  Constitutional:  Negative for chills, fever and weight loss.  HENT:  Negative for congestion and hearing loss.   Eyes:  Negative for blurred vision and double vision.  Respiratory:  Negative for cough, shortness of breath and wheezing.   Cardiovascular:  Positive for leg swelling (left lower leg with pain). Negative for chest pain, palpitations and orthopnea.  Gastrointestinal:  Negative for abdominal pain, constipation, diarrhea, heartburn, nausea and vomiting.  Musculoskeletal:  Negative for falls, joint pain and myalgias.  Skin:  Negative for rash.       Skin tear right knee and left shin  Neurological:  Negative for dizziness, tingling, tremors, loss of consciousness and headaches.  Psychiatric/Behavioral:  Negative for depression, memory loss and suicidal ideas.      Physical Exam:  BP 124/78   Pulse 76   Temp 98.1 F (36.7 C)   Ht '5\' 3"'$  (1.6 m)   Wt 106 lb 12.8 oz (48.4 kg)   SpO2 94%   BMI 18.92 kg/m   General Appearance: Well nourished, in no apparent distress. Eyes: PERRLA, EOMs, conjunctiva no swelling or erythema Sinuses: No Frontal/maxillary tenderness ENT/Mouth: Ext aud canals clear, TMs without erythema, bulging. No erythema, swelling, or exudate on post pharynx.  Tonsils not swollen or erythematous. Hearing normal.  Neck: Supple, thyroid normal.  Respiratory: Respiratory effort normal, BS equal bilaterally without rales, rhonchi, wheezing or stridor.   Cardio: RRR with no MRGs. Brisk peripheral pulses without edema.  Abdomen: Soft, + BS.  Non tender, no guarding, rebound, hernias, masses. Lymphatics: Non tender without lymphadenopathy.  Musculoskeletal: Full ROM, 5/5 strength, antalgic gait. Left lower leg has  2+ pitting edema, positive homans sign and mild erythema Skin: Warm, dry.  5 cm diameter superficial skin tear of right knee. 1 cm skin tear of left shin. Both areas were cleansed with peroxide and betadine and tegaderm dressing was applied.   Neuro: Cranial nerves intact. Normal muscle tone, no cerebellar symptoms. Sensation intact.  Psych: Awake and oriented X 3, normal affect, Insight and Judgment appropriate.     Alycia Rossetti, NP 12:18 PM [facility]

## 2022-09-27 NOTE — Telephone Encounter (Signed)
Patient wants to know if she should be taking the Torsemide '20mg'$  in AM and PM to help with swelling?

## 2022-09-27 NOTE — Telephone Encounter (Signed)
Patient aware to take one in the AM and to make sure she is drinking lots of water so she doesn't get dehydrated.

## 2022-09-28 ENCOUNTER — Ambulatory Visit: Payer: Medicare Other

## 2022-09-28 LAB — CBC WITH DIFFERENTIAL/PLATELET
Absolute Monocytes: 567 cells/uL (ref 200–950)
Basophils Absolute: 31 cells/uL (ref 0–200)
Basophils Relative: 0.5 %
Eosinophils Absolute: 122 cells/uL (ref 15–500)
Eosinophils Relative: 2 %
HCT: 38.6 % (ref 35.0–45.0)
Hemoglobin: 12.7 g/dL (ref 11.7–15.5)
Lymphs Abs: 1141 cells/uL (ref 850–3900)
MCH: 29.7 pg (ref 27.0–33.0)
MCHC: 32.9 g/dL (ref 32.0–36.0)
MCV: 90.2 fL (ref 80.0–100.0)
MPV: 9.7 fL (ref 7.5–12.5)
Monocytes Relative: 9.3 %
Neutro Abs: 4240 cells/uL (ref 1500–7800)
Neutrophils Relative %: 69.5 %
Platelets: 195 10*3/uL (ref 140–400)
RBC: 4.28 10*6/uL (ref 3.80–5.10)
RDW: 12.8 % (ref 11.0–15.0)
Total Lymphocyte: 18.7 %
WBC: 6.1 10*3/uL (ref 3.8–10.8)

## 2022-09-28 LAB — BASIC METABOLIC PANEL WITH GFR
BUN/Creatinine Ratio: 11 (calc) (ref 6–22)
BUN: 11 mg/dL (ref 7–25)
CO2: 29 mmol/L (ref 20–32)
Calcium: 9.6 mg/dL (ref 8.6–10.4)
Chloride: 106 mmol/L (ref 98–110)
Creat: 1.02 mg/dL — ABNORMAL HIGH (ref 0.60–1.00)
Glucose, Bld: 74 mg/dL (ref 65–99)
Potassium: 4 mmol/L (ref 3.5–5.3)
Sodium: 144 mmol/L (ref 135–146)
eGFR: 58 mL/min/{1.73_m2} — ABNORMAL LOW (ref >=60)

## 2022-10-03 ENCOUNTER — Ambulatory Visit (INDEPENDENT_AMBULATORY_CARE_PROVIDER_SITE_OTHER): Payer: Medicare Other | Admitting: Nurse Practitioner

## 2022-10-03 VITALS — BP 152/88 | HR 87 | Temp 98.1°F | Ht 63.0 in | Wt 108.8 lb

## 2022-10-03 DIAGNOSIS — M79642 Pain in left hand: Secondary | ICD-10-CM | POA: Diagnosis not present

## 2022-10-03 DIAGNOSIS — R58 Hemorrhage, not elsewhere classified: Secondary | ICD-10-CM

## 2022-10-03 DIAGNOSIS — R600 Localized edema: Secondary | ICD-10-CM

## 2022-10-03 DIAGNOSIS — W19XXXA Unspecified fall, initial encounter: Secondary | ICD-10-CM

## 2022-10-03 DIAGNOSIS — S81811A Laceration without foreign body, right lower leg, initial encounter: Secondary | ICD-10-CM

## 2022-10-03 DIAGNOSIS — S81812D Laceration without foreign body, left lower leg, subsequent encounter: Secondary | ICD-10-CM

## 2022-10-03 DIAGNOSIS — M7989 Other specified soft tissue disorders: Secondary | ICD-10-CM

## 2022-10-03 NOTE — Patient Instructions (Signed)

## 2022-10-03 NOTE — Progress Notes (Signed)
Assessment and Plan:  Diagnoses and all orders for this visit:  Fall, initial encounter Suggested stool for sitting in garden to help prevent falling over Change positions slowly  - DG Hand Complete Left; Future  Left hand pain/edema/ecchymosis RICE Method Tylenol for pain  - DG Hand Complete Left; Future  Localized edema/BLE Continue compression stockings Elevate feet Monitor salt and fluid intake Keep BP well controlled  - DG Hand Complete Left; Future  Skin tear of right lower leg without complication Healing well Continue to monitor   Notify office for further evaluation and treatment, questions or concerns if any reported s/s fail to improve.   The patient was advised to call back or seek an in-person evaluation if any symptoms worsen or if the condition fails to improve as anticipated.   Further disposition pending results of labs. Discussed med's effects and SE's.    I discussed the assessment and treatment plan with the patient. The patient was provided an opportunity to ask questions and all were answered. The patient agreed with the plan and demonstrated an understanding of the instructions.  Discussed med's effects and SE's. Screening labs and tests as requested with regular follow-up as recommended.  I provided 20 minutes of face-to-face time during this encounter including counseling, chart review, and critical decision making was preformed.   Future Appointments  Date Time Provider [place]  10/09/2022  4:00 PM Unk Pinto, MD [facility] None  11/15/2022 10:00 AM Unk Pinto, MD [facility] None    ------------------------------------------------------------------------------------------------------------------   HPI BP (!) 152/88   Pulse 87   Temp 98.1 F (36.7 C)   Ht '5\' 3"'$  (1.6 m)   Wt 108 lb 12.8 oz (49.4 kg)   SpO2 99%   BMI 19.27 kg/m   72 y.o.female presents for follow up after being seen in our office 09/27/22 for  swelling of her LLE.  She reported that when walking her dog he pulled her down.  She fell onto knees. She had skin tear of right knee and left shin.  A STAT doppler was ordered and patient was provided Apixaban 5 mg to start. She was found to have NO DVT ON ULTRASOUND but was positive for a bakers cyst.   She was advised to wear compression socks and RTC in 5 days to reevaluate.  The skin tears were treated and dressed with peroxide betadine and tegaderm.  The areas are now healed over with scabs. Provider did note that the LLE was swollen and tender to the touch. She continues to have BLE edema and pain has mostly improved. She does have BLE +2 edema. Noted to be chronic in nature and not responsive to low dose lasix.  Of note, she follows with [facility] for arthritic pain.  She saw them on 8/23 for right knee and right shoulder pain and a large joint injection was completed. She takes a daily diltiazem and furosemide PRN and torsemide.  She has followed with Dr. Stanford Breed in the past but has not recently seen since 2019.  Today she is most concerned for her left wrist.  Reports another fall 2 days ago while working in her garden.  Fell backward and landed with palm out onto left wrist Today area is swollen and painful with movement.  No break in skin.    She is part of CCM and needs close f/u d/t non-compliance but has long standing issue.  BP is elevated in clinic today d/t pain: BP Readings from Last 3 Encounters:  10/03/22 (!) 152/88  09/27/22 124/78  09/08/22 138/80    Past Medical History:  Diagnosis Date   Anxiety and depression    Arthritis    Colon polyps    GERD (gastroesophageal reflux disease)    Hemorrhoids    Hiatal hernia    History of kidney stones    passed   Hyperlipemia    Mixed hyperlipidemia 11/26/2013   Panic disorder    Passive smoke exposure 07/17/2018   Renal insufficiency    stage 3 kidney disease per her PCP   Stomach ulcer    Unspecified essential  hypertension 11/26/2013     Allergies  Allergen Reactions   Other     All pain medications: Unknown/ CODEINE   Prednisone Other (See Comments)    DYSPHORIA   Vioxx [Rofecoxib]     unknown   Entex T [Pseudoephedrine-Guaifenesin] Palpitations   Levaquin [Levofloxacin] Rash    Tolerated Avelox without adverse effect 11/08/15.   Sulfa Antibiotics Rash   Trovan [Alatrofloxacin] Rash    Current Outpatient Medications on File Prior to Visit  Medication Sig   ALPRAZolam (XANAX) 0.5 MG tablet Take 1/2  to 1 tablet 2 to 3 x /day as needed for Nerves   apixaban (ELIQUIS) 5 MG TABS tablet Take 1 tablet (5 mg total) by mouth 2 (two) times daily.   Ascorbic Acid (VITAMIN C) 1000 MG tablet Take 1,000 mg by mouth daily.   cholecalciferol (VITAMIN D) 1000 units tablet Take 10,000 Units by mouth daily.   diltiazem (CARDIZEM) 120 MG tablet Take  1 tablet  2 x /day (every 12 hours)  for BP                                      /                 TAKE                           BY                     MOUTH   furosemide (LASIX) 20 MG tablet TAKE 1 TABLET(20 MG) BY MOUTH DAILY AS NEEDED FOR SWELLING   potassium chloride SA (KLOR-CON M) 20 MEQ tablet TAKE 1 TABLET BY MOUTH  DAILY FOR POTASSIUM   rosuvastatin (CRESTOR) 20 MG tablet Take  1 tablet  Daily  for Cholesterol   sertraline (ZOLOFT) 100 MG tablet Take 1 tablet (100 mg total) by mouth daily.   torsemide (DEMADEX) 20 MG tablet Take 20 mg by mouth every morning.   vitamin B-12 (CYANOCOBALAMIN) 250 MCG tablet Take 250 mcg by mouth daily.   No current facility-administered medications on file prior to visit.    ROS:  A complete ROS was performed with pertinent positives/negatives noted in the HPI. The remainder of the ROS are negative.  Physical Exam:  BP (!) 152/88   Pulse 87   Temp 98.1 F (36.7 C)   Ht '5\' 3"'$  (1.6 m)   Wt 108 lb 12.8 oz (49.4 kg)   SpO2 99%   BMI 19.27 kg/m   General Appearance: Well nourished, in no apparent distress. Eyes:  PERRLA, EOMs, conjunctiva no swelling or erythema Sinuses: No Frontal/maxillary tenderness ENT/Mouth: Ext aud canals clear, TMs without erythema, bulging. No erythema, swelling, or exudate on post pharynx.  Tonsils  not swollen or erythematous. Hearing normal.  Neck: Supple, thyroid normal.  Respiratory: Respiratory effort normal, BS equal bilaterally without rales, rhonchi, wheezing or stridor.  Cardio: RRR with no MRGs. Brisk peripheral pulses with +2 BLE edema.  Abdomen: Soft, + BS.  Non tender, no guarding, rebound, hernias, masses. Lymphatics: Non tender without lymphadenopathy.  Musculoskeletal: Moderate edema to left lateral wrist along 4th and 5th metacarpal joints.  LROM d/t pain.  Tender to palpation.  BLE knees with well healing abrasions, surrounding skin intact and WNL. Mild ecchymosis.  No erythema. Skin: Warm, dry.  5 cm diameter superficial skin tear of right knee. 1 cm skin tear of left shin. Both areas were cleansed with peroxide and betadine and tegaderm dressing was applied.   Neuro: Cranial nerves intact. Normal muscle tone, no cerebellar symptoms. Sensation intact.  Psych: Awake and oriented X 3, normal affect, Insight and Judgment appropriate.     Darrol Jump, NP 10:44 AM [facility]

## 2022-10-04 ENCOUNTER — Ambulatory Visit
Admission: RE | Admit: 2022-10-04 | Discharge: 2022-10-04 | Disposition: A | Discharge status: Home / Self Care | Payer: Medicare Other | Source: Ambulatory Visit | Attending: Nurse Practitioner | Admitting: Nurse Practitioner

## 2022-10-04 DIAGNOSIS — M79642 Pain in left hand: Secondary | ICD-10-CM | POA: Diagnosis not present

## 2022-10-04 DIAGNOSIS — W19XXXA Unspecified fall, initial encounter: Secondary | ICD-10-CM

## 2022-10-04 DIAGNOSIS — R600 Localized edema: Secondary | ICD-10-CM | POA: Diagnosis not present

## 2022-10-04 DIAGNOSIS — M25531 Pain in right wrist: Secondary | ICD-10-CM

## 2022-10-04 DIAGNOSIS — M19031 Primary osteoarthritis, right wrist: Secondary | ICD-10-CM | POA: Diagnosis not present

## 2022-10-04 DIAGNOSIS — R58 Hemorrhage, not elsewhere classified: Secondary | ICD-10-CM

## 2022-10-04 DIAGNOSIS — M25741 Osteophyte, right hand: Secondary | ICD-10-CM | POA: Diagnosis not present

## 2022-10-05 ENCOUNTER — Telehealth: Payer: Self-pay | Admitting: Nurse Practitioner

## 2022-10-05 NOTE — Telephone Encounter (Signed)
She is wanting advice from [facility] for how what she should do about her ankles swelling. If she may need an x-ray on those as well post her fall.

## 2022-10-06 ENCOUNTER — Ambulatory Visit: Payer: Medicare Other | Admitting: Nurse Practitioner

## 2022-10-09 ENCOUNTER — Encounter: Payer: Self-pay | Admitting: Internal Medicine

## 2022-10-09 ENCOUNTER — Ambulatory Visit (INDEPENDENT_AMBULATORY_CARE_PROVIDER_SITE_OTHER): Payer: Medicare Other | Admitting: Internal Medicine

## 2022-10-09 VITALS — BP 160/90 | HR 96 | Temp 97.5°F | Ht 63.0 in | Wt 108.0 lb

## 2022-10-09 DIAGNOSIS — R634 Abnormal weight loss: Secondary | ICD-10-CM | POA: Diagnosis not present

## 2022-10-09 DIAGNOSIS — I872 Venous insufficiency (chronic) (peripheral): Secondary | ICD-10-CM | POA: Diagnosis not present

## 2022-10-09 DIAGNOSIS — I10 Essential (primary) hypertension: Secondary | ICD-10-CM | POA: Diagnosis not present

## 2022-10-09 DIAGNOSIS — F339 Major depressive disorder, recurrent, unspecified: Secondary | ICD-10-CM

## 2022-10-09 MED ORDER — OLMESARTAN MEDOXOMIL 20 MG PO TABS: ORAL_TABLET | ORAL | 1 refills | Status: DC

## 2022-10-09 MED ORDER — BISOPROLOL FUMARATE 10 MG PO TABS: ORAL_TABLET | ORAL | 3 refills | Status: AC

## 2022-10-09 NOTE — Progress Notes (Signed)
Future Appointments  Date Time Provider Department  10/30/2022 11:30 AM Unk Pinto, MD [facility]  11/15/2022 10:00 AM Unk Pinto, MD [facility]    History of Present Illness:      This very nice 72 y.o.  Latoya [name]  with HTN, HLD, Pre-Diabetes and Vitamin D Deficiency presents with c/o bilat LE  edema.  She denies anysx's suspect for  CHF.  Apparently on Jan 24 , she was started on Eliquis for suspected DVT , but Dopplers returned Negative.  She has been on Diltiazem  ( d/c'd today)  for BP which is suspected  contributory to her edema.  BP is noted elevated today at 160/90. Other problems is she is noted to have lost 13# from 121# to current 108# which she attributes to depression & lack of appetite since her husband died last year.    Current Outpatient Medications on File Prior to Visit  Medication Sig   ALPRAZolam (XANAX) 0.5 MG tablet Take 1/2  to 1 tablet 2 to 3 x /day as needed for Nerves   apixaban (ELIQUIS) 5 MG TABS tablet Take 1 tablet (5 mg total) by mouth 2 (two) times daily.   Ascorbic Acid (VITAMIN C) 1000 MG tablet Take 1,000 mg by mouth daily.   cholecalciferol (VITAMIN D) 1000 units tablet Take 10,000 Units by mouth daily.   potassium chloride  20 MEQ tablet TAKE 1 TABLET DAILY    rosuvastatin (CRESTOR) 20 MG tablet Take  1 tablet  Daily  for Cholesterol   sertraline (ZOLOFT) 100 MG tablet Take 1 tablet (100 mg total) by mouth daily.   torsemide (DEMADEX) 20 MG tablet Take 20 mg by mouth every morning.   vitamin B-12  250 MCG tablet Take 250 mcg by mouth daily.     Allergies  Allergen Reactions   Other     All pain medications: Unknown/ CODEINE   Prednisone Other (See Comments)    DYSPHORIA   Vioxx [Rofecoxib]     unknown   Entex T [Pseudoephedrine-Guaifenesin] Palpitations   Levaquin [Levofloxacin] Rash    Tolerated Avelox without adverse effect 11/08/15.   Sulfa Antibiotics Rash   Trovan [Alatrofloxacin] Rash     Problem list She has Esophageal  reflux; Generalized anxiety disorder; Chronic cough; Essential hypertension; Hyperlipidemia, mixed; Abnormal glucose (prediabetes); Vitamin D deficiency; Medication management; Aortic atherosclerosis ([place]) by Abd CT scan 02/24/2015; Hiatal hernia; Chronic rhinitis; Laryngopharyngeal reflux (LPR); Multiple lentigines syndrome; FHx: heart disease; Osteoporosis; Recurrent acute serous otitis media of right ear; History of eustachian tube dysfunction; TB lung, latent; Tenderness of both temporomandibular joints; Multiple thyroid nodules; H/O thyroid disease; and CKD (chronic kidney disease) stage 2, GFR 68 ml/min on their problem list.   Observations/Objective:  BP (!) 160/90   Pulse 96   Temp (!) 97.5 F (36.4 C)   Ht '5\' 3"'$  (1.6 m)   Wt 108 lb (49 kg)   SpO2 99%   BMI 19.13 kg/m   HEENT - WNL. Neck - supple.  Chest - Clear equal BS. Cor - Nl HS. RRR w/o sig MGR. PP 1(+).  1-2 + pretibial  & ankle edema . MS- FROM w/o deformities.  Gait Nl. Neuro -  Nl w/o focal abnormalities. Flat affect.    Assessment and Plan:   1. Essential hypertension  -D/C Diltiazem & Eliquis  - olmesartan (BENICAR) 20 MG tablet;  Take  1 tablet  every Night for  BP   Dispense: 90 tablet; Refill: 1  - bisoprolol (  ZEBETA) 10 MG tablet;  Take 1 tablet every morning for  BP  Dispense: 90 tablet; Refill: 3  2. Edema of both lower extremities due to peripheral venous insufficiency    Follow Up Instructions:                              ROV 3 weeks to recheck                               I discussed the assessment and treatment plan with the patient. The patient was provided an opportunity to ask questions and all were answered. The patient agreed with the plan and demonstrated an understanding of the instructions.                               The patient was advised to call back or seek an in-person evaluation if the symptoms worsen or if the condition fails to improve as anticipated.    Kirtland Bouchard, MD

## 2022-10-09 NOTE — Telephone Encounter (Signed)
Patient is coming into the office today to follow up on her legs hurting with edema

## 2022-10-09 NOTE — Patient Instructions (Addendum)
Peripheral Edema  Peripheral edema is swelling that is caused by a buildup of fluid. Peripheral edema most often affects the lower legs, ankles, and feet. It can also develop in the arms, hands, and face. The area of the body that has peripheral edema will look swollen. It may also feel heavy or warm. Your clothes may start to feel tight. Pressing on the area may make a temporary dent in your skin (pitting edema). You may not be able to move your swollen arm or leg as much as usual. There are many causes of peripheral edema. It can happen because of a complication of other conditions such as heart failure, kidney disease, or a problem with your circulation. It also can be a side effect of certain medicines or happen because of an infection. It often happens to women during pregnancy. Sometimes, the cause is not known. Follow these instructions at home: Managing pain, stiffness, and swelling  Raise (elevate) your legs while you are sitting or lying down. Move around often to prevent stiffness and to reduce swelling. Do not sit or stand for long periods of time. Do not wear tight clothing. Do not wear garters on your upper legs. Exercise your legs to get your circulation going. This helps to move the fluid back into your blood vessels, and it may help the swelling go down. Wear compression stockings as told by your health care provider. These stockings help to prevent blood clots and reduce swelling in your legs. It is important that these are the correct size. These stockings should be prescribed by your doctor to prevent possible injuries. If elastic bandages or wraps are recommended, use them as told by your health care provider. Medicines Take over-the-counter and prescription medicines only as told by your health care provider. Your health care provider may prescribe medicine to help your body get rid of excess water (diuretic). Take this medicine if you are told to take it. General  instructions Eat a low-salt (low-sodium) diet as told by your health care provider. Sometimes, eating less salt may reduce swelling. Pay attention to any changes in your symptoms. Moisturize your skin daily to help prevent skin from cracking and draining. Keep all follow-up visits. This is important. Contact a health care provider if: You have a fever. You have swelling in only one leg. You have increased swelling, redness, or pain in one or both of your legs. You have drainage or sores at the area where you have edema. Get help right away if: You have edema that starts suddenly or is getting worse, especially if you are pregnant or have a medical condition. You develop shortness of breath, especially when you are lying down. You have pain in your chest or abdomen. You feel weak. You feel like you will faint. These symptoms may be an emergency. Get help right away. Call 911. Do not wait to see if the symptoms will go away. Do not drive yourself to the hospital. Summary Peripheral edema is swelling that is caused by a buildup of fluid. Peripheral edema most often affects the lower legs, ankles, and feet. Move around often to prevent stiffness and to reduce swelling. Do not sit or stand for long periods of time. Pay attention to any changes in your symptoms. Contact a health care provider if you have edema that starts suddenly or is getting worse, especially if you are pregnant or have a medical condition. Get help right away if you develop shortness of breath, especially when lying down. ================================================= ================================================= =================================================  Low-Sodium Eating Plan Sodium, which is an element that makes up salt, helps you maintain a healthy balance of fluids in your body. Too much sodium can increase your blood pressure and cause fluid and waste to be held in your body. Your health care provider  or dietitian may recommend following this plan if you have high blood pressure (hypertension), kidney disease, liver disease, or heart failure. Eating less sodium can help lower your blood pressure, reduce swelling, and protect your heart, liver, and kidneys. What are tips for following this plan? Reading food labels The Nutrition Facts label lists the amount of sodium in one serving of the food. If you eat more than one serving, you must multiply the listed amount of sodium by the number of servings. Choose foods with less than 140 mg of sodium per serving. Avoid foods with 300 mg of sodium or more per serving. Shopping  Look for lower-sodium products, often labeled as "low-sodium" or "no salt added." Always check the sodium content, even if foods are labeled as "unsalted" or "no salt added." Buy fresh foods. Avoid canned foods and pre-made or frozen meals. Avoid canned, cured, or processed meats. Buy breads that have less than 80 mg of sodium per slice. Cooking  Eat more home-cooked food and less restaurant, buffet, and fast food. Avoid adding salt when cooking. Use salt-free seasonings or herbs instead of table salt or sea salt. Check with your health care provider or pharmacist before using salt substitutes. Cook with plant-based oils, such as canola, sunflower, or olive oil. Meal planning When eating at a restaurant, ask that your food be prepared with less salt or no salt, if possible. Avoid dishes labeled as brined, pickled, cured, smoked, or made with soy sauce, miso, or teriyaki sauce. Avoid foods that contain MSG (monosodium glutamate). MSG is sometimes added to [place] food, bouillon, and some canned foods. Make meals that can be grilled, baked, poached, roasted, or steamed. These are generally made with less sodium. General information Most people on this plan should limit their sodium intake to 1,500-2,000 mg (milligrams) of sodium each day. What foods should I  eat? Fruits Fresh, frozen, or canned fruit. Fruit juice. Vegetables Fresh or frozen vegetables. "No salt added" canned vegetables. "No salt added" tomato sauce and paste. Low-sodium or reduced-sodium tomato and vegetable juice. Grains Low-sodium cereals, including oats, puffed wheat and rice, and shredded wheat. Low-sodium crackers. Unsalted rice. Unsalted pasta. Low-sodium bread. Whole-grain breads and whole-grain pasta. Meats and other proteins Fresh or frozen (no salt added) meat, poultry, seafood, and fish. Low-sodium canned tuna and salmon. Unsalted nuts. Dried peas, beans, and lentils without added salt. Unsalted canned beans. Eggs. Unsalted nut butters. Dairy Milk. Soy milk. Cheese that is naturally low in sodium, such as ricotta cheese, fresh mozzarella, or Swiss cheese. Low-sodium or reduced-sodium cheese. Cream cheese. Yogurt. Seasonings and condiments Fresh and dried herbs and spices. Salt-free seasonings. Low-sodium mustard and ketchup. Sodium-free salad dressing. Sodium-free light mayonnaise. Fresh or refrigerated horseradish. Lemon juice. Vinegar. Other foods Homemade, reduced-sodium, or low-sodium soups. Unsalted popcorn and pretzels. Low-salt or salt-free chips. The items listed above may not be a complete list of foods and beverages you can eat. Contact a dietitian for more information. What foods should I avoid? Vegetables Sauerkraut, pickled vegetables, and relishes. Olives. [place] fries. Onion rings. Regular canned vegetables (not low-sodium or reduced-sodium). Regular canned tomato sauce and paste (not low-sodium or reduced-sodium). Regular tomato and vegetable juice (not low-sodium or reduced-sodium). Frozen vegetables in sauces. Grains  Instant hot cereals. Bread stuffing, pancake, and biscuit mixes. Croutons. Seasoned rice or pasta mixes. Noodle soup cups. Boxed or frozen macaroni and cheese. Regular salted crackers. Self-rising flour. Meats and other proteins Meat or  fish that is salted, canned, smoked, spiced, or pickled. Precooked or cured meat, such as sausages or meat loaves. Berniece Salines. Ham. Pepperoni. Hot dogs. Corned beef. Chipped beef. Salt pork. Jerky. Pickled herring. Anchovies and sardines. Regular canned tuna. Salted nuts. Dairy Processed cheese and cheese spreads. Hard cheeses. Cheese curds. Blue cheese. Feta cheese. String cheese. Regular cottage cheese. Buttermilk. Canned milk. Fats and oils Salted butter. Regular margarine. Ghee. Bacon fat. Seasonings and condiments Onion salt, garlic salt, seasoned salt, table salt, and sea salt. Canned and packaged gravies. Worcestershire sauce. Tartar sauce. Barbecue sauce. Teriyaki sauce. Soy sauce, including reduced-sodium. Steak sauce. Fish sauce. Oyster sauce. Cocktail sauce. Horseradish that you find on the shelf. Regular ketchup and mustard. Meat flavorings and tenderizers. Bouillon cubes. Hot sauce. Pre-made or packaged marinades. Pre-made or packaged taco seasonings. Relishes. Regular salad dressings. Salsa. Other foods Salted popcorn and pretzels. Corn chips and puffs. Potato and tortilla chips. Canned or dried soups. Pizza. Frozen entrees and pot pies. The items listed above may not be a complete list of foods and beverages you should avoid. Contact a dietitian for more information. Summary Eating less sodium can help lower your blood pressure, reduce swelling, and protect your heart, liver, and kidneys. Most people on this plan should limit their sodium intake to 1,500-2,000 mg (milligrams) of sodium each day. Canned, boxed, and frozen foods are high in sodium. Restaurant foods, fast foods, and pizza are also very high in sodium. You also get sodium by adding salt to food. Try to cook at home, eat more fresh fruits and vegetables, and eat less fast food and canned, processed, or prepared foods. This information is not intended to replace advice given to you by your health care provider. Make sure you  discuss any questions you have with your health care provider. Document Revised: 09/26/2019 Document Reviewed: 07/23/2019 Elsevier Patient Education  [place].

## 2022-10-10 ENCOUNTER — Other Ambulatory Visit: Payer: Self-pay

## 2022-10-10 MED ORDER — MIRTAZAPINE 30 MG PO TABS: ORAL_TABLET | ORAL | 1 refills | Status: DC

## 2022-10-10 NOTE — Addendum Note (Signed)
Addended by: Unk Pinto on: 10/10/2022 01:39 AM   Modules accepted: Orders

## 2022-10-19 ENCOUNTER — Encounter: Payer: Self-pay | Admitting: Internal Medicine

## 2022-10-19 ENCOUNTER — Ambulatory Visit (INDEPENDENT_AMBULATORY_CARE_PROVIDER_SITE_OTHER): Payer: Medicare Other | Admitting: Internal Medicine

## 2022-10-19 VITALS — BP 138/78 | HR 81 | Temp 97.9°F | Resp 18 | Ht 63.0 in | Wt 106.2 lb

## 2022-10-19 DIAGNOSIS — I872 Venous insufficiency (chronic) (peripheral): Secondary | ICD-10-CM | POA: Diagnosis not present

## 2022-10-19 DIAGNOSIS — I10 Essential (primary) hypertension: Secondary | ICD-10-CM

## 2022-10-19 NOTE — Progress Notes (Signed)
Future Appointments  Date Time Provider Department  10/19/2022 11:30 AM Unk Pinto, MD [facility]  10/30/2022 11:30 AM Unk Pinto, MD [facility]  11/15/2022 10:00 AM Unk Pinto, MD [facility]    History of Present Illness:       This very nice 72 y.o.  Latoya [name]  with HTN, HLD, Pre-Diabetes and Vitamin D Deficiency presents for f/u after stopping Diltiazem 10 days ago for concern of precipitating worsening dependent edema. She continued her Demadex and weight is doen 2 more pounds from 108  -->> 106 #  with improvement in her ankle edema.    Current Outpatient Medications on File Prior to Visit  Medication Sig   ALPRAZolam  0.5 MG tablet Take 1/2  to 1 tablet 2 to 3 x /day as needed for Nerves   VITAMIN C 1000 MG tablet Take 1,000 mg daily.   bisoprolol 10 MG tablet Take 1 tablet every morning for  BP   VITAMIN D 1000 units  Take 10,000 Units daily.   mirtazapine 30 MG tablet Take 1/2 to 1 tablet 1 to 2 hours before Bedtime   olmesartan 20 MG tablet Take  1 tablet  every Night for  BP   potassium chloride  20 MEQ  TAKE 1 TABLET  DAILY    rosuvastatin ( 20 MG tablet Take  1 tablet  Daily     sertraline  100 MG tablet Take 1 tablet  daily.   torsemide (DEMADEX) 20 MG  Take  every morning.   vitamin B-12  250 MCG tablet Take daily.     Allergies  Allergen Reactions   Other     All pain medications: Unknown/ CODEINE   Prednisone Other (See Comments)    DYSPHORIA   Vioxx [Rofecoxib]     unknown   Entex T [Pseudoephedrine-Guaifenesin] Palpitations   Levaquin [Levofloxacin] Rash    Tolerated Avelox without adverse effect 11/08/15.   Sulfa Antibiotics Rash   Trovan [Alatrofloxacin] Rash     Problem list She has Esophageal reflux; Generalized anxiety disorder; Chronic cough; Essential hypertension; Hyperlipidemia, mixed; Abnormal glucose (prediabetes); Vitamin D deficiency; Medication management; Aortic atherosclerosis ([place]) by Abd CT scan 02/24/2015; Chronic  rhinitis; Laryngopharyngeal reflux (LPR); Multiple lentigines syndrome; Osteoporosis; Recurrent acute serous otitis media of right ear; History of eustachian tube dysfunction; TB lung, latent; Tenderness of both temporomandibular joints; Multiple thyroid nodules; H/O thyroid disease; and CKD (chronic kidney disease) stage 2, GFR 68 ml/min on their problem list.   Observations/Objective:  BP 138/78   Pulse 81   Temp 97.9 F (36.6 C)   Resp 18   Ht 5' 3"$  (1.6 m)   Wt 106 lb 3.2 oz (48.2 kg)   SpO2 96%   BMI 18.81 kg/m   HEENT - WNL. Neck - supple.  Chest - Clear equal BS. Cor - Nl HS. RRR w/o sig MGR. PP 1(+).  Trace 1 + ankle edema. MS- FROM w/o deformities.  Gait Nl. Neuro -  Nl w/o focal abnormalities.   Assessment and Plan:   1. Essential hypertension   2. Edema of both lower extremities due to peripheral venous insufficiency   Follow Up Instructions:        I discussed the assessment and treatment plan with the patient. The patient was provided an opportunity to ask questions and all were answered. The patient agreed with the plan and demonstrated an understanding of the instructions.  Over34mnutes of counseling,  chart review and critical decision making was performed  The patient was advised to call back or seek an in-person evaluation if the symptoms worsen or if the condition fails to improve as anticipated.    Kirtland Bouchard, MD

## 2022-10-23 ENCOUNTER — Encounter: Payer: Self-pay | Admitting: Internal Medicine

## 2022-10-23 ENCOUNTER — Ambulatory Visit: Payer: Medicare Other

## 2022-10-25 ENCOUNTER — Ambulatory Visit: Payer: Medicare Other

## 2022-10-29 NOTE — Progress Notes (Unsigned)
Future Appointments  Date Time Provider Department  10/30/2022 11:30 AM Unk Pinto, MD [facility]  11/15/2022 10:00 AM Unk Pinto, MD [facility]    History of Present Illness:   Patient is a 72 yo WWF  returning for 10 day f/u  of dependent ankle edema after d/c'ing  Diltiazem  and continuing Torsemide /Demadex . Her weight is actually up 3# over the last 10 days . She denies use of regu;lar salt, canned foods, etc.    Wt Readings from Last 3 Encounters:  10/30/22 109 lb 12.8 oz   10/19/22 106 lb 3.2 oz   10/09/22 108 lb      Current Outpatient Medications on File Prior to Visit  Medication Sig   ALPRAZolam (XANAX) 0.5 MG tablet Take 1/2  to 1 tablet 2 to 3 x /day as needed for Nerves   Ascorbic Acid (VITAMIN C) 1000 MG tablet Take 1,000 mg by mouth daily.   bisoprolol (ZEBETA) 10 MG tablet Take 1 tablet every morning for  BP   cholecalciferol (VITAMIN D) 1000 units tablet Take 10,000 Units by mouth daily.   mirtazapine (REMERON) 30 MG tablet Take 1/2 to 1 tablet 1 to 2 hours before Bedtime for Appetite , Depression & Sleep.   olmesartan (BENICAR) 20 MG tablet Take  1 tablet  every Night for  BP   potassium chloride SA (KLOR-CON M) 20 MEQ tablet TAKE 1 TABLET BY MOUTH  DAILY FOR POTASSIUM   rosuvastatin (CRESTOR) 20 MG tablet Take  1 tablet  Daily  for Cholesterol   sertraline (ZOLOFT) 100 MG tablet Take 1 tablet (100 mg total) by mouth daily.   torsemide (DEMADEX) 20 MG tablet Take 20 mg by mouth every morning.   vitamin B-12 250 MCG tablet Take 250 mcg by mouth daily.     Allergies  Allergen Reactions   Other     All pain medications: Unknown/ CODEINE   Prednisone Other (See Comments)    DYSPHORIA   Vioxx [Rofecoxib]     unknown   Entex T [Pseudoephedrine-Guaifenesin] Palpitations   Levaquin [Levofloxacin] Rash    Tolerated Avelox without adverse effect 11/08/15.   Sulfa Antibiotics Rash   Trovan [Alatrofloxacin] Rash     Problem list She has  Esophageal reflux; Generalized anxiety disorder; Chronic cough; Essential hypertension; Hyperlipidemia, mixed; Abnormal glucose (prediabetes); Vitamin D deficiency; Medication management; Aortic atherosclerosis ([place]) by Abd CT scan 02/24/2015; Chronic rhinitis; Laryngopharyngeal reflux (LPR); Multiple lentigines syndrome; Osteoporosis; Recurrent acute serous otitis media of right ear; History of eustachian tube dysfunction; TB lung, latent; Tenderness of both temporomandibular joints; Multiple thyroid nodules; H/O thyroid disease; and CKD (chronic kidney disease) stage 2, GFR 68 ml/min on their problem list.   Observations/Objective:  BP 130/78   Pulse 62   Temp (!) 97.3 F (36.3 C)   Ht '5\' 3"'$  (1.6 m)   Wt 109 lb 12.8 oz (49.8 kg)   SpO2 98%   BMI 19.45 kg/m   HEENT - WNL. Neck - supple.  Chest - Clear equal BS. Cor - Nl HS. RRR w/o sig MGR. PP 1(+). 1-2 (+) LE & ankle /pedal edema.   MS- FROM w/o deformities.  Gait Nl. Neuro -  Nl w/o focal abnormalities.   Assessment and Plan:   1. Edema of both lower extremities due to peripheral venous insufficiency  Increase  - torsemide (DEMADEX) 20 MG tablet;  Take  1 tablet   2 x / Day for BP & Fluid Retention /  Ankle Swelling   Dispense: 180 tablet; Refill: 3   Follow Up Instructions:        I discussed the assessment and treatment plan with the patient. The patient was provided an opportunity to ask questions and all were answered. The patient agreed with the plan and demonstrated an understanding of the instructions.       The patient was advised to call back or seek an in-person evaluation if the symptoms worsen or if the condition fails to improve as anticipated.    Kirtland Bouchard, MD

## 2022-10-30 ENCOUNTER — Encounter: Payer: Self-pay | Admitting: Internal Medicine

## 2022-10-30 ENCOUNTER — Ambulatory Visit (INDEPENDENT_AMBULATORY_CARE_PROVIDER_SITE_OTHER): Payer: Medicare Other | Admitting: Internal Medicine

## 2022-10-30 VITALS — BP 130/78 | HR 62 | Temp 97.3°F | Ht 63.0 in | Wt 109.8 lb

## 2022-10-30 DIAGNOSIS — I872 Venous insufficiency (chronic) (peripheral): Secondary | ICD-10-CM | POA: Diagnosis not present

## 2022-10-30 MED ORDER — TORSEMIDE 20 MG PO TABS: ORAL_TABLET | ORAL | 3 refills | Status: DC

## 2022-10-30 NOTE — Patient Instructions (Signed)
Low-Sodium Eating Plan Sodium, which is an element that makes up salt, helps you maintain a healthy balance of fluids in your body. Too much sodium can increase your blood pressure and cause fluid and waste to be held in your body. Your health care provider or dietitian may recommend following this plan if you have high blood pressure (hypertension), kidney disease, liver disease, or heart failure. Eating less sodium can help lower your blood pressure, reduce swelling, and protect your heart, liver, and kidneys. What are tips for following this plan? Reading food labels The Nutrition Facts label lists the amount of sodium in one serving of the food. If you eat more than one serving, you must multiply the listed amount of sodium by the number of servings. Choose foods with less than 140 mg of sodium per serving. Avoid foods with 300 mg of sodium or more per serving. Shopping  Look for lower-sodium products, often labeled as "low-sodium" or "no salt added." Always check the sodium content, even if foods are labeled as "unsalted" or "no salt added." Buy fresh foods. Avoid canned foods and pre-made or frozen meals. Avoid canned, cured, or processed meats. Buy breads that have less than 80 mg of sodium per slice. Cooking  Eat more home-cooked food and less restaurant, buffet, and fast food. Avoid adding salt when cooking. Use salt-free seasonings or herbs instead of table salt or sea salt. Check with your health care provider or pharmacist before using salt substitutes. Cook with plant-based oils, such as canola, sunflower, or olive oil. Meal planning When eating at a restaurant, ask that your food be prepared with less salt or no salt, if possible. Avoid dishes labeled as brined, pickled, cured, smoked, or made with soy sauce, miso, or teriyaki sauce. Avoid foods that contain MSG (monosodium glutamate). MSG is sometimes added to [place] food, bouillon, and some canned foods. Make meals that can  be grilled, baked, poached, roasted, or steamed. These are generally made with less sodium. General information Most people on this plan should limit their sodium intake to 1,500-2,000 mg (milligrams) of sodium each day. What foods should I eat? Fruits Fresh, frozen, or canned fruit. Fruit juice. Vegetables Fresh or frozen vegetables. "No salt added" canned vegetables. "No salt added" tomato sauce and paste. Low-sodium or reduced-sodium tomato and vegetable juice. Grains Low-sodium cereals, including oats, puffed wheat and rice, and shredded wheat. Low-sodium crackers. Unsalted rice. Unsalted pasta. Low-sodium bread. Whole-grain breads and whole-grain pasta. Meats and other proteins Fresh or frozen (no salt added) meat, poultry, seafood, and fish. Low-sodium canned tuna and salmon. Unsalted nuts. Dried peas, beans, and lentils without added salt. Unsalted canned beans. Eggs. Unsalted nut butters. Dairy Milk. Soy milk. Cheese that is naturally low in sodium, such as ricotta cheese, fresh mozzarella, or Swiss cheese. Low-sodium or reduced-sodium cheese. Cream cheese. Yogurt. Seasonings and condiments Fresh and dried herbs and spices. Salt-free seasonings. Low-sodium mustard and ketchup. Sodium-free salad dressing. Sodium-free light mayonnaise. Fresh or refrigerated horseradish. Lemon juice. Vinegar. Other foods Homemade, reduced-sodium, or low-sodium soups. Unsalted popcorn and pretzels. Low-salt or salt-free chips. The items listed above may not be a complete list of foods and beverages you can eat. Contact a dietitian for more information. What foods should I avoid? Vegetables Sauerkraut, pickled vegetables, and relishes. Olives. French fries. Onion rings. Regular canned vegetables (not low-sodium or reduced-sodium). Regular canned tomato sauce and paste (not low-sodium or reduced-sodium). Regular tomato and vegetable juice (not low-sodium or reduced-sodium). Frozen vegetables in  sauces. Grains   Instant hot cereals. Bread stuffing, pancake, and biscuit mixes. Croutons. Seasoned rice or pasta mixes. Noodle soup cups. Boxed or frozen macaroni and cheese. Regular salted crackers. Self-rising flour. Meats and other proteins Meat or fish that is salted, canned, smoked, spiced, or pickled. Precooked or cured meat, such as sausages or meat loaves. Berniece Salines. Ham. Pepperoni. Hot dogs. Corned beef. Chipped beef. Salt pork. Jerky. Pickled herring. Anchovies and sardines. Regular canned tuna. Salted nuts. Dairy Processed cheese and cheese spreads. Hard cheeses. Cheese curds. Blue cheese. Feta cheese. String cheese. Regular cottage cheese. Buttermilk. Canned milk. Fats and oils Salted butter. Regular margarine. Ghee. Bacon fat. Seasonings and condiments Onion salt, garlic salt, seasoned salt, table salt, and sea salt. Canned and packaged gravies. Worcestershire sauce. Tartar sauce. Barbecue sauce. Teriyaki sauce. Soy sauce, including reduced-sodium. Steak sauce. Fish sauce. Oyster sauce. Cocktail sauce. Horseradish that you find on the shelf. Regular ketchup and mustard. Meat flavorings and tenderizers. Bouillon cubes. Hot sauce. Pre-made or packaged marinades. Pre-made or packaged taco seasonings. Relishes. Regular salad dressings. Salsa. Other foods Salted popcorn and pretzels. Corn chips and puffs. Potato and tortilla chips. Canned or dried soups. Pizza. Frozen entrees and pot pies. The items listed above may not be a complete list of foods and beverages you should avoid. Contact a dietitian for more information. Summary Eating less sodium can help lower your blood pressure, reduce swelling, and protect your heart, liver, and kidneys. Most people on this plan should limit their sodium intake to 1,500-2,000 mg (milligrams) of sodium each day. Canned, boxed, and frozen foods are high in sodium. Restaurant foods, fast foods, and pizza are also very high in sodium. You also get sodium by  adding salt to food. Try to cook at home, eat more fresh fruits and vegetables, and eat less fast food and canned, processed, or prepared foods. This information is not intended to replace advice given to you by your health care provider. Make sure you discuss any questions you have with your health care provider. Document Revised: 07/28/2019 Document Reviewed: 07/23/2019 Elsevier Patient Education  [place].

## 2022-11-02 ENCOUNTER — Other Ambulatory Visit: Payer: Self-pay | Admitting: Nurse Practitioner

## 2022-11-02 DIAGNOSIS — M7989 Other specified soft tissue disorders: Secondary | ICD-10-CM

## 2022-11-09 ENCOUNTER — Encounter: Payer: Medicare Other | Admitting: Adult Health

## 2022-11-10 ENCOUNTER — Encounter: Payer: Medicare Other | Admitting: Adult Health

## 2022-11-15 ENCOUNTER — Ambulatory Visit (INDEPENDENT_AMBULATORY_CARE_PROVIDER_SITE_OTHER): Payer: Medicare Other | Admitting: Internal Medicine

## 2022-11-15 ENCOUNTER — Encounter: Payer: Self-pay | Admitting: Internal Medicine

## 2022-11-15 VITALS — BP 130/68 | HR 100 | Temp 97.9°F | Resp 17 | Ht 63.0 in | Wt 107.0 lb

## 2022-11-15 DIAGNOSIS — E559 Vitamin D deficiency, unspecified: Secondary | ICD-10-CM | POA: Diagnosis not present

## 2022-11-15 DIAGNOSIS — Z136 Encounter for screening for cardiovascular disorders: Secondary | ICD-10-CM

## 2022-11-15 DIAGNOSIS — Z79899 Other long term (current) drug therapy: Secondary | ICD-10-CM | POA: Diagnosis not present

## 2022-11-15 DIAGNOSIS — Z0001 Encounter for general adult medical examination with abnormal findings: Secondary | ICD-10-CM

## 2022-11-15 DIAGNOSIS — I700 Atherosclerosis of aorta: Secondary | ICD-10-CM

## 2022-11-15 DIAGNOSIS — Z0000 Encounter for general adult medical examination without abnormal findings: Secondary | ICD-10-CM | POA: Diagnosis not present

## 2022-11-15 DIAGNOSIS — N1831 Chronic kidney disease, stage 3a: Secondary | ICD-10-CM

## 2022-11-15 DIAGNOSIS — I10 Essential (primary) hypertension: Secondary | ICD-10-CM

## 2022-11-15 DIAGNOSIS — Z1211 Encounter for screening for malignant neoplasm of colon: Secondary | ICD-10-CM

## 2022-11-15 DIAGNOSIS — E782 Mixed hyperlipidemia: Secondary | ICD-10-CM

## 2022-11-15 DIAGNOSIS — R7309 Other abnormal glucose: Secondary | ICD-10-CM

## 2022-11-15 NOTE — Patient Instructions (Addendum)
Due to recent changes in healthcare laws, you may see the results of your imaging and laboratory studies on MyChart before your provider has had a chance to review them.  We understand that in some cases there may be results that are confusing or concerning to you. Not all laboratory results come back in the same time frame and the provider may be waiting for multiple results in order to interpret others.  Please give [place] 48 hours in order for your provider to thoroughly review all the results before contacting the office for clarification of your results.   +++++++++++++++++++++++++  Vit D  & Vit C 1,000 mg   are recommended to help protect  against the Covid-19 and other Corona viruses.    Also it's recommended  to take  Zinc 50 mg x 1/2 tablet = 25 mg  /day  to help  protect against the Covid-19   and best place to get  is also on [email or web address]  and don't pay more than 6-8 cents /pill !  ================================ Coronavirus (COVID-19) Are you at risk?  Are you at risk for the Coronavirus (COVID-19)?  To be considered HIGH RISK for Coronavirus (COVID-19), you have to meet the following criteria:  Traveled to [place], [place], [place], [place] or [place]; or in the [place] to [place], [place], [place]  or [place]; and have fever, cough, and shortness of breath within the last 2 weeks of travel OR Been in close contact with a person diagnosed with COVID-19 within the last 2 weeks and have  fever, cough,and shortness of breath  IF YOU DO NOT MEET THESE CRITERIA, YOU ARE CONSIDERED LOW RISK FOR COVID-19.  What to do if you are HIGH RISK for COVID-19?  If you are having a medical emergency, call 911. Seek medical care right away. Before you go to a doctor's office, urgent care or emergency department,  call ahead and tell them about your recent travel, contact with someone diagnosed with COVID-19   and your symptoms.  You should receive instructions from your  physician's office regarding next steps of care.  When you arrive at healthcare provider, tell the healthcare staff immediately you have returned from  visiting [place], [place], [place], [place] or [place]; or traveled in the [place] to [place], [place],  [place] or [place] in the last two weeks or you have been in close contact with a person diagnosed with  COVID-19 in the last 2 weeks.   Tell the health care staff about your symptoms: fever, cough and shortness of breath. After you have been seen by a medical provider, you will be either: Tested for (COVID-19) and discharged home on quarantine except to seek medical care if  symptoms worsen, and asked to  Stay home and avoid contact with others until you get your results (4-5 days)  Avoid travel on public transportation if possible (such as bus, train, or airplane) or Sent to the Emergency Department by EMS for evaluation, COVID-19 testing  and  possible admission depending on your condition and test results.  What to do if you are LOW RISK for COVID-19?  Reduce your risk of any infection by using the same precautions used for avoiding the common cold or flu:  Wash your hands often with soap and warm water for at least 20 seconds.  If soap and water are not readily available,  use an alcohol-based hand sanitizer with at least 60% alcohol.  If coughing or  sneezing, cover your mouth and nose by coughing or sneezing into the elbow areas of your shirt or coat,  into a tissue or into your sleeve (not your hands). Avoid shaking hands with others and consider head nods or verbal greetings only. Avoid touching your eyes, nose, or mouth with unwashed hands.  Avoid close contact with people who are sick. Avoid places or events with large numbers of people in one location, like concerts or sporting events. Carefully consider travel plans you have or are making. If you are planning any travel outside or inside the [place], visit the CDC's  Travelers' Health webpage for the latest health notices. If you have some symptoms but not all symptoms, continue to monitor at home and seek medical attention  if your symptoms worsen. If you are having a medical emergency, call 911.   >>>>>>>>>>>>>>>>>>>>>>>>>>>>>>>>>  We Do NOT Approve of [employer] > > > > > > > > > > > > > > > > > > > > > > > > > > > > > > > > > > > > > > >  Preventive Care for Adults  A healthy lifestyle and preventive care can promote health and wellness. Preventive health guidelines for women include the following key practices. A routine yearly physical is a good way to check with your health care provider about your health and preventive screening. It is a chance to share any concerns and updates on your health and to receive a thorough exam. Visit your dentist for a routine exam and preventive care every 6 months. Brush your teeth twice a day and floss once a day. Good oral hygiene prevents tooth decay and gum disease. The frequency of eye exams is based on your age, health, family medical history, use of contact lenses, and other factors. Follow your health care provider's recommendations for frequency of eye exams. Eat a healthy diet. Foods like vegetables, fruits, whole grains, low-fat dairy products, and lean protein foods contain the nutrients you need without too many calories. Decrease your intake of foods high in solid fats, added sugars, and salt. Eat the right amount of calories for you. Get information about a proper diet from your health care provider, if necessary. Regular physical exercise is one of the most important things you can do for your health. Most adults should get at least 150 minutes of moderate-intensity exercise (any activity that increases your heart rate and causes you to sweat) each week. In addition, most adults need muscle-strengthening exercises on 2 or more days a week. Maintain a healthy weight. The body mass index (BMI) is a  screening tool to identify possible weight problems. It provides an estimate of body fat based on height and weight. Your health care provider can find your BMI and can help you achieve or maintain a healthy weight. For adults 20 years and older: A BMI below 18.5 is considered underweight. A BMI of 18.5 to 24.9 is normal. A BMI of 25 to 29.9 is considered overweight. A BMI of 30 and above is considered obese. Maintain normal blood lipids and cholesterol levels by exercising and minimizing your intake of saturated fat. Eat a balanced diet with plenty of fruit and vegetables. If your lipid or cholesterol levels are high, you are over 50, or you are at high risk for heart disease, you may need your cholesterol levels checked more frequently. Ongoing high lipid and cholesterol levels should be treated with medicines if diet and exercise are  not working. If you smoke, find out from your health care provider how to quit. If you do not use tobacco, do not start. Lung cancer screening is recommended for adults aged 69-80 years who are at high risk for developing lung cancer because of a history of smoking. A yearly low-dose CT scan of the lungs is recommended for people who have at least a 30-pack-year history of smoking and are a current smoker or have quit within the past 15 years. A pack year of smoking is smoking an average of 1 pack of cigarettes a day for 1 year (for example: 1 pack a day for 30 years or 2 packs a day for 15 years). Yearly screening should continue until the smoker has stopped smoking for at least 15 years. Yearly screening should be stopped for people who develop a health problem that would prevent them from having lung cancer treatment. Avoid use of street drugs. Do not share needles with anyone. Ask for help if you need support or instructions about stopping the use of drugs. High blood pressure causes heart disease and increases the risk of stroke.  Ongoing high blood pressure should be  treated with medicines if weight loss and exercise do not work. If you are 41-54 years old, ask your health care provider if you should take aspirin to prevent strokes. Diabetes screening involves taking a blood sample to check your fasting blood sugar level. This should be done once every 3 years, after age 8, if you are within normal weight and without risk factors for diabetes. Testing should be considered at a younger age or be carried out more frequently if you are overweight and have at least 1 risk factor for diabetes. Breast cancer screening is essential preventive care for women. You should practice "breast self-awareness." This means understanding the normal appearance and feel of your breasts and may include breast self-examination. Any changes detected, no matter how small, should be reported to a health care provider. Women in their 15s and 30s should have a clinical breast exam (CBE) by a health care provider as part of a regular health exam every 1 to 3 years. After age 80, women should have a CBE every year. Starting at age 33, women should consider having a mammogram (breast X-ray test) every year. Women who have a family history of breast cancer should talk to their health care provider about genetic screening. Women at a high risk of breast cancer should talk to their health care providers about having an MRI and a mammogram every year. Breast cancer gene (BRCA)-related cancer risk assessment is recommended for women who have family members with BRCA-related cancers. BRCA-related cancers include breast, ovarian, tubal, and peritoneal cancers. Having family members with these cancers may be associated with an increased risk for harmful changes (mutations) in the breast cancer genes BRCA1 and BRCA2. Results of the assessment will determine the need for genetic counseling and BRCA1 and BRCA2 testing. Routine pelvic exams to screen for cancer are no longer recommended for nonpregnant women who  are considered low risk for cancer of the pelvic organs (ovaries, uterus, and vagina) and who do not have symptoms. Ask your health care provider if a screening pelvic exam is right for you. If you have had past treatment for cervical cancer or a condition that could lead to cancer, you need Pap tests and screening for cancer for at least 20 years after your treatment. If Pap tests have been discontinued, your risk factors (such as  having a new sexual partner) need to be reassessed to determine if screening should be resumed. Some women have medical problems that increase the chance of getting cervical cancer. In these cases, your health care provider may recommend more frequent screening and Pap tests.  Colorectal cancer can be detected and often prevented. Most routine colorectal cancer screening begins at the age of 71 years and continues through age 40 years. However, your health care provider may recommend screening at an earlier age if you have risk factors for colon cancer. On a yearly basis, your health care provider may provide home test kits to check for hidden blood in the stool. Use of a small camera at the end of a tube, to directly examine the colon (sigmoidoscopy or colonoscopy), can detect the earliest forms of colorectal cancer. Talk to your health care provider about this at age 77, when routine screening begins.  Direct exam of the colon should be repeated every 5-10 years through age 44 years, unless early forms of pre-cancerous polyps or small growths are found. Osteoporosis is a disease in which the bones lose minerals and strength with aging. This can result in serious bone fractures or breaks. The risk of osteoporosis can be identified using a bone density scan. Women ages 43 years and over and women at risk for fractures or osteoporosis should discuss screening with their health care providers. Ask your health care provider whether you should take a calcium supplement or vitamin D to  reduce the rate of osteoporosis. Menopause can be associated with physical symptoms and risks. Hormone replacement therapy is available to decrease symptoms and risks. You should talk to your health care provider about whether hormone replacement therapy is right for you. Use sunscreen. Apply sunscreen liberally and repeatedly throughout the day. You should seek shade when your shadow is shorter than you. Protect yourself by wearing long sleeves, pants, a wide-brimmed hat, and sunglasses year round, whenever you are outdoors. Once a month, do a whole body skin exam, using a mirror to look at the skin on your back. Tell your health care provider of new moles, moles that have irregular borders, moles that are larger than a pencil eraser, or moles that have changed in shape or color. Stay current with required vaccines (immunizations). Influenza vaccine. All adults should be immunized every year. Tetanus, diphtheria, and acellular pertussis (Td, Tdap) vaccine. Pregnant women should receive 1 dose of Tdap vaccine during each pregnancy. The dose should be obtained regardless of the length of time since the last dose. Immunization is preferred during the 27th-36th week of gestation. An adult who has not previously received Tdap or who does not know her vaccine status should receive 1 dose of Tdap. This initial dose should be followed by tetanus and diphtheria toxoids (Td) booster doses every 10 years. Adults with an unknown or incomplete history of completing a 3-dose immunization series with Td-containing vaccines should begin or complete a primary immunization series including a Tdap dose. Adults should receive a Td booster every 10 years.  Zoster vaccine. One dose is recommended for adults aged 67 years or older unless certain conditions are present.  Pneumococcal 13-valent conjugate (PCV13) vaccine. When indicated, a person who is uncertain of her immunization history and has no record of immunization should  receive the PCV13 vaccine. An adult aged 79 years or older who has certain medical conditions and has not been previously immunized should receive 1 dose of PCV13 vaccine. This PCV13 should be followed with a  dose of pneumococcal polysaccharide (PPSV23) vaccine. The PPSV23 vaccine dose should be obtained at least 1 or more year(s) after the dose of PCV13 vaccine. An adult aged 32 years or older who has certain medical conditions and previously received 1 or more doses of PPSV23 vaccine should receive 1 dose of PCV13. The PCV13 vaccine dose should be obtained 1 or more years after the last PPSV23 vaccine dose.  Pneumococcal polysaccharide (PPSV23) vaccine. When PCV13 is also indicated, PCV13 should be obtained first. All adults aged 15 years and older should be immunized. An adult younger than age 46 years who has certain medical conditions should be immunized. Any person who resides in a nursing home or long-term care facility should be immunized. An adult smoker should be immunized. People with an immunocompromised condition and certain other conditions should receive both PCV13 and PPSV23 vaccines. People with human immunodeficiency virus (HIV) infection should be immunized as soon as possible after diagnosis. Immunization during chemotherapy or radiation therapy should be avoided. Routine use of PPSV23 vaccine is not recommended for American Indians, [place] Natives, or people younger than 65 years unless there are medical conditions that require PPSV23 vaccine. When indicated, people who have unknown immunization and have no record of immunization should receive PPSV23 vaccine. One-time revaccination 5 years after the first dose of PPSV23 is recommended for people aged 19-64 years who have chronic kidney failure, nephrotic syndrome, asplenia, or immunocompromised conditions. People who received 1-2 doses of PPSV23 before age 50 years should receive another dose of PPSV23 vaccine at age 36 years or later if at  least 5 years have passed since the previous dose. Doses of PPSV23 are not needed for people immunized with PPSV23 at or after age 33 years.  Preventive Services / Frequency  Ages 32 years and over Blood pressure check. Lipid and cholesterol check. Lung cancer screening. / Every year if you are aged 6-80 years and have a 30-pack-year history of smoking and currently smoke or have quit within the past 15 years. Yearly screening is stopped once you have quit smoking for at least 15 years or develop a health problem that would prevent you from having lung cancer treatment. Clinical breast exam.** / Every year after age 30 years.  BRCA-related cancer risk assessment.** / For women who have family members with a BRCA-related cancer (breast, ovarian, tubal, or peritoneal cancers). Mammogram.** / Every year beginning at age 63 years and continuing for as long as you are in good health. Consult with your health care provider. Pap test.** / Every 3 years starting at age 41 years through age 34 or 84 years with 3 consecutive normal Pap tests. Testing can be stopped between 65 and 70 years with 3 consecutive normal Pap tests and no abnormal Pap or HPV tests in the past 10 years. Fecal occult blood test (FOBT) of stool. / Every year beginning at age 29 years and continuing until age 47 years. You may not need to do this test if you get a colonoscopy every 10 years. Flexible sigmoidoscopy or colonoscopy.** / Every 5 years for a flexible sigmoidoscopy or every 10 years for a colonoscopy beginning at age 60 years and continuing until age 36 years. Hepatitis C blood test.** / For all people born from [birth year] through 1966 and any individual with known risks for hepatitis C. Osteoporosis screening.** / A one-time screening for women ages 70 years and over and women at risk for fractures or osteoporosis. Skin self-exam. / Monthly. Influenza vaccine. / Every  year. Tetanus, diphtheria, and acellular pertussis (Tdap/Td)  vaccine.** / 1 dose of Td every 10 years. Zoster vaccine.** / 1 dose for adults aged 25 years or older. Pneumococcal 13-valent conjugate (PCV13) vaccine.** / Consult your health care provider. Pneumococcal polysaccharide (PPSV23) vaccine.** / 1 dose for all adults aged 43 years and older. Screening for abdominal aortic aneurysm (AAA)  by ultrasound is recommended for people who have history of high blood pressure or who are current or former smokers. ++++++++++++++++++++ Recommend Adult Low Dose Aspirin or  coated  Aspirin 81 mg daily  To reduce risk of Colon Cancer 40 %,  Skin Cancer 26 % ,  Melanoma 46%  and  Pancreatic cancer 60% ++++++++++++++++++++ Vitamin D goal  is between 70-100.  Please make sure that you are taking your Vitamin D as directed.  It is very important as a natural anti-inflammatory  helping hair, skin, and nails, as well as reducing stroke and heart attack risk.  It helps your bones and helps with mood. It also decreases numerous cancer risks so please take it as directed.  Low Vit D is associated with a 200-300% higher risk for CANCER  and 200-300% higher risk for HEART   ATTACK  &  STROKE.   .....................................Marland Kitchen It is also associated with higher death rate at younger ages,  autoimmune diseases like Rheumatoid arthritis, Lupus, Multiple Sclerosis.    Also many other serious conditions, like depression, Alzheimer's Dementia, infertility, muscle aches, fatigue, fibromyalgia - just to name a few. ++++++++++++++++++ Recommend the book "The END of DIETING" by Dr Excell Seltzer  & the book "The END of DIABETES " by Dr Excell Seltzer At [email or web address] - get book & Audio CD's    Being diabetic has a  300% increased risk for heart attack, stroke, cancer, and alzheimer- type vascular dementia. It is very important that you work harder with diet by avoiding all foods that are white. Avoid white rice (brown & wild rice is OK), white potatoes (sweetpotatoes in  moderation is OK), White bread or wheat bread or anything made out of white flour like bagels, donuts, rolls, buns, biscuits, cakes, pastries, cookies, pizza crust, and pasta (made from white flour & egg whites) - vegetarian pasta or spinach or wheat pasta is OK. Multigrain breads like Arnold's or [employer], or multigrain sandwich thins or flatbreads.  Diet, exercise and weight loss can reverse and cure diabetes in the early stages.  Diet, exercise and weight loss is very important in the control and prevention of complications of diabetes which affects every system in your body, ie. Brain - dementia/stroke, eyes - glaucoma/blindness, heart - heart attack/heart failure, kidneys - dialysis, stomach - gastric paralysis, intestines - malabsorption, nerves - severe painful neuritis, circulation - gangrene & loss of a leg(s), and finally cancer and Alzheimers.    I recommend avoid fried & greasy foods,  sweets/candy, white rice (brown or wild rice or Quinoa is OK), white potatoes (sweet potatoes are OK) - anything made from white flour - bagels, doughnuts, rolls, buns, biscuits,white and wheat breads, pizza crust and traditional pasta made of white flour & egg white(vegetarian pasta or spinach or wheat pasta is OK).  Multi-grain bread is OK - like multi-grain flat bread or sandwich thins. Avoid alcohol in excess. Exercise is also important.    Eat all the vegetables you want - avoid meat, especially red meat and dairy - especially cheese.  Cheese is the most concentrated form of trans-fats which is the worst thing  to clog up our arteries. Veggie cheese is OK which can be found in the fresh produce section at [employer] or [employer] or [employer]  +++++++++++++++++++ DASH Eating Plan  DASH stands for "Dietary Approaches to Stop Hypertension."   The DASH eating plan is a healthy eating plan that has been shown to reduce high blood pressure (hypertension). Additional health benefits may include reducing  the risk of type 2 diabetes mellitus, heart disease, and stroke. The DASH eating plan may also help with weight loss. WHAT DO I NEED TO KNOW ABOUT THE DASH EATING PLAN? For the DASH eating plan, you will follow these general guidelines: Choose foods with a percent daily value for sodium of less than 5% (as listed on the food label). Use salt-free seasonings or herbs instead of table salt or sea salt. Check with your health care provider or pharmacist before using salt substitutes. Eat lower-sodium products, often labeled as "lower sodium" or "no salt added." Eat fresh foods. Eat more vegetables, fruits, and low-fat dairy products. Choose whole grains. Look for the word "whole" as the first word in the ingredient list. Choose fish  Limit sweets, desserts, sugars, and sugary drinks. Choose heart-healthy fats. Eat veggie cheese  Eat more home-cooked food and less restaurant, buffet, and fast food. Limit fried foods. Cook foods using methods other than frying. Limit canned vegetables. If you do use them, rinse them well to decrease the sodium. When eating at a restaurant, ask that your food be prepared with less salt, or no salt if possible.                      WHAT FOODS CAN I EAT? Read Dr Fara Olden Fuhrman's books on The End of Dieting & The End of Diabetes  Grains Whole grain or whole wheat bread. Brown rice. Whole grain or whole wheat pasta. Quinoa, bulgur, and whole grain cereals. Low-sodium cereals. Corn or whole wheat flour tortillas. Whole grain cornbread. Whole grain crackers. Low-sodium crackers.  Vegetables Fresh or frozen vegetables (raw, steamed, roasted, or grilled). Low-sodium or reduced-sodium tomato and vegetable juices. Low-sodium or reduced-sodium tomato sauce and paste. Low-sodium or reduced-sodium canned vegetables.   Fruits All fresh, canned (in natural juice), or frozen fruits.  Protein Products  All fish and seafood.  Dried beans, peas, or lentils. Unsalted nuts and  seeds. Unsalted canned beans.  Dairy Low-fat dairy products, such as skim or 1% milk, 2% or reduced-fat cheeses, low-fat ricotta or cottage cheese, or plain low-fat yogurt. Low-sodium or reduced-sodium cheeses.  Fats and Oils Tub margarines without trans fats. Light or reduced-fat mayonnaise and salad dressings (reduced sodium). Avocado. Safflower, olive, or canola oils. Natural peanut or almond butter.  Other Unsalted popcorn and pretzels. The items listed above may not be a complete list of recommended foods or beverages. Contact your dietitian for more options.  +++++++++++++++  WHAT FOODS ARE NOT RECOMMENDED? Grains/ White flour or wheat flour White bread. White pasta. White rice. Refined cornbread. Bagels and croissants. Crackers that contain trans fat.  Vegetables  Creamed or fried vegetables. Vegetables in a . Regular canned vegetables. Regular canned tomato sauce and paste. Regular tomato and vegetable juices.  Fruits Dried fruits. Canned fruit in light or heavy syrup. Fruit juice.  Meat and Other Protein Products Meat in general - RED meat & White meat.  Fatty cuts of meat. Ribs, chicken wings, all processed meats as bacon, sausage, bologna, salami, fatback, hot dogs, bratwurst and packaged luncheon meats.  Dairy  Whole or 2% milk, cream, half-and-half, and cream cheese. Whole-fat or sweetened yogurt. Full-fat cheeses or blue cheese. Non-dairy creamers and whipped toppings. Processed cheese, cheese spreads, or cheese curds.  Condiments Onion and garlic salt, seasoned salt, table salt, and sea salt. Canned and packaged gravies. Worcestershire sauce. Tartar sauce. Barbecue sauce. Teriyaki sauce. Soy sauce, including reduced sodium. Steak sauce. Fish sauce. Oyster sauce. Cocktail sauce. Horseradish. Ketchup and mustard. Meat flavorings and tenderizers. Bouillon cubes. Hot sauce. Tabasco sauce. Marinades. Taco seasonings. Relishes.  Fats and Oils Butter, stick margarine, lard,  shortening and bacon fat. Coconut, palm kernel, or palm oils. Regular salad dressings.  Pickles and olives. Salted popcorn and pretzels.  The items listed above may not be a complete list of foods and beverages to avoid.

## 2022-11-15 NOTE — Progress Notes (Signed)
Annual Screening/Preventative Visit &  Comprehensive Evaluation &  Examination   Future Appointments  Date Time Provider Department  11/15/2022 10:00 AM Unk Pinto, MD [facility]  11/20/2023 11:00 AM Unk Pinto, MD [facility]        This very nice 72 y.o. [facility] presents for a Screening /Preventative Visit & comprehensive evaluation and management of multiple medical co-morbidities.  Patient has been followed for HTN, HLD, Prediabetes  and Vitamin D Deficiency.   Patient has hx/o Chronic Anxiety & Depression & has been followed by Dr Casimiro Needle in the past. Abd CTscan in 2016 showed Aortic Atherosclerosis.          HTN predates since  82. Patient's BP has been controlled at home and patient denies any cardiac symptoms as chest pain, palpitations, shortness of breath, dizziness,  but does have chronic dependent edema apparently not responding to low dose lasix . Today's BP  is at goal -  130/68.         Patient's hyperlipidemia is controlled with diet and Rosuvastatin. Patient denies myalgias or other medication SE's. Last lipids were at goal :  Lab Results  Component Value Date   CHOL 136 09/08/2022   HDL 58 09/08/2022   LDLCALC 60 09/08/2022   TRIG 94 09/08/2022   CHOLHDL 2.3 09/08/2022         Patient has hx/o prediabetes  (A1c 5.8% /2014 & 5.7% /2015 w/elevated Insulin 40)  and patient denies reactive hypoglycemic symptoms, visual blurring, diabetic polys or paresthesias. A1c was 5.7% in Mar 2023 & last A1c was at goal :  Lab Results  Component Value Date   HGBA1C 5.2 02/22/2022   Wt Readings from Last 3 Encounters:  11/15/22 107 lb (48.5 kg)  10/30/22 109 lb 12.8 oz (49.8 kg)  10/19/22 106 lb 3.2 oz (48.2 kg)         Finally, patient has history of Vitamin D Deficiency and last Vitamin D was at goal :  Lab Results  Component Value Date   VD25OH 74 02/22/2022     Current Outpatient Medications  Medication Instructions   ALPRAZolam (XANAX) 0.5 MG  tablet Take 1/2  to 1 tablet 2 to 3 x /day as needed for Nerves   bisoprolol (ZEBETA) 10 MG tablet Take 1 tablet every morning for  BP   cholecalciferol (VITAMIN D) 10,000 Units, Oral, Daily   cyanocobalamin (VITAMIN B12) 250 mcg, Oral, Daily   mirtazapine (REMERON) 30 MG tablet Take 1/2 to 1 tablet 1 to 2 hours before Bedtime for Appetite , Depression & Sleep.   olmesartan (BENICAR) 20 MG tablet Take  1 tablet  every Night for  BP   potassium chloride SA (KLOR-CON M) 20 MEQ tablet TAKE 1 TABLET BY MOUTH  DAILY FOR POTASSIUM   rosuvastatin (CRESTOR) 20 MG tablet Take  1 tablet  Daily  for Cholesterol   sertraline (ZOLOFT) 100 mg, Oral, Daily   torsemide (DEMADEX) 20 MG tablet Take  1 tablet   2 x / Day for BP & Fluid Retention / Ankle Swelling   vitamin C 1,000 mg, Oral, Daily        Allergies  Allergen Reactions   Other     All pain medications: Unknown/ CODEINE   Prednisone Other (See Comments)    DYSPHORIA   Vioxx [Rofecoxib]     unknown   Entex T [Pseudoephedrine-Guaifenesin] Palpitations   Levaquin [Levofloxacin] Rash    Tolerated Avelox without adverse effect 11/08/15.   Sulfa Antibiotics  Rash   Trovan [Alatrofloxacin] Rash     Past Medical History:  Diagnosis Date   Anxiety and depression    Arthritis    Colon polyps    GERD (gastroesophageal reflux disease)    Hemorrhoids    Hiatal hernia    History of kidney stones    passed   Hyperlipemia    Mixed hyperlipidemia 11/26/2013   Panic disorder    Passive smoke exposure 07/17/2018   Renal insufficiency    stage 3 kidney disease per her PCP   Stomach ulcer    Unspecified essential hypertension 11/26/2013     Health Maintenance  Topic Date Due   COVID-19 Vaccine (1) Never done   Zoster Vaccines- Shingrix (1 of 2) Never done   MAMMOGRAM  06/23/2022   COLONOSCOPY (Pts 45-62yr Insurance coverage will need to be confirmed)  12/13/2022   Medicare Annual Wellness (AWV)  06/08/2023   DEXA SCAN  06/24/2023    DTaP/Tdap/Td (3 - Td or Tdap) 03/23/2027   INFLUENZA VACCINE  Completed   Hepatitis C Screening  Completed   HPV VACCINES  Aged Out   Pneumonia Vaccine 72 Years old  Discontinued     Immunization History  Administered Date(s) Administered   Influenza Split 06/04/2014, 06/05/2015   Influenza, High Dose  05/21/2019, 05/26/2021, 06/06/2022   Influenza 06/06/2013, 06/13/2016   Pneumococcal -13 06/05/2015   Pneumococcal -23 04/06/2014   Pneumococcal-23 09/04/1996, 09/04/2016   Td 09/04/2005   Tdap 03/22/2017    Last Colon - 12/12/2012 - Dr GCarlean Purl- Recommended 10 year f/u   Last MGM - 06/23/20 & patient aware overdue & is ordered   Past Surgical History:  Procedure Laterality Date   ABDOMINAL HYSTERECTOMY     CARPOMETACARPEL SUSPENSION PLASTY Right 12/08/2013   Procedure: CARPOMETACARPEL ([facility] SUSPENSION PLASTY;  Surgeon: DJolyn Nap MD;  Location[facility]  Service: Orthopedics;  Laterality: Right;   COLONOSCOPY     FOOT SURGERY Left 2008   Bunion and pulled tendons   HAND SURGERY Right    Dog Bite   I & D EXTREMITY Right 01/20/2017   Procedure: RIGHT HAND IRRIGATION AND DEBRIDEMENT AND REPAIR AS INDICATED;  Surgeon: OIran Planas MD;  Location[place] [facility]  Service: Orthopedics;  Laterality: Right;   OOPHORECTOMY Right    TUBAL LIGATION     UPPER GASTROINTESTINAL ENDOSCOPY       Family History  Problem Relation Age of Onset   Heart disease Brother        x2   Pancreatic cancer Brother    Dementia Brother    Heart attack Brother        457s-50s was on drugs   Heart disease Father    Asthma Mother    Heart Problems Maternal Grandfather    CVA Brother    Other Brother        "lots of health problems" - unsure specifics   Tuberculosis Daughter    Colon cancer Neg Hx    Stomach cancer Neg Hx    Breast cancer Neg Hx      Social History   Tobacco Use   Smoking status: Never    Passive exposure: Yes   Smokeless tobacco: Never   Tobacco  comments:    Husband & Father smoked  Vaping Use   Vaping Use: Never used  Substance Use Topics   Alcohol use: No    Alcohol/week: 0.0 standard drinks of alcohol   Drug use: No  ROS Constitutional: Denies fever, chills, weight loss/gain, headaches, insomnia,  night sweats, and change in appetite. Does c/o fatigue. Eyes: Denies redness, blurred vision, diplopia, discharge, itchy, watery eyes.  ENT: Denies discharge, congestion, post nasal drip, epistaxis, sore throat, earache, hearing loss, dental pain, Tinnitus, Vertigo, Sinus pain, snoring.  Cardio: Denies chest pain, palpitations, irregular heartbeat, syncope, dyspnea, diaphoresis, orthopnea, PND, claudication, edema Respiratory: denies cough, dyspnea, DOE, pleurisy, hoarseness, laryngitis, wheezing.  Gastrointestinal: Denies dysphagia, heartburn, reflux, water brash, pain, cramps, nausea, vomiting, bloating, diarrhea, constipation, hematemesis, melena, hematochezia, jaundice, hemorrhoids Genitourinary: Denies dysuria, frequency, urgency, nocturia, hesitancy, discharge, hematuria, flank pain Breast: Breast lumps, nipple discharge, bleeding.  Musculoskeletal: Denies arthralgia, myalgia, stiffness, Jt. Swelling, pain, limp, and strain/sprain. Denies falls. Skin: Denies puritis, rash, hives, warts, acne, eczema, changing in skin lesion Neuro: No weakness, tremor, incoordination, spasms, paresthesia, pain Psychiatric: Denies confusion, memory loss, sensory loss. Denies Depression. Endocrine: Denies change in weight, skin, hair change, nocturia, and paresthesia, diabetic polys, visual blurring, hyper / hypo glycemic episodes.  Heme/Lymph: No excessive bleeding, bruising, enlarged lymph nodes.  Physical Exam  BP 130/68   Pulse 100   Temp 97.9 F (36.6 C)   Resp 17   Ht '5\' 3"'$  (1.6 m)   Wt 107 lb (48.5 kg)   SpO2 94%   BMI 18.95 kg/m   General Appearance: Well nourished, well groomed and in no apparent distress.  Eyes: PERRLA,  EOMs, conjunctiva no swelling or erythema, normal fundi and vessels. Sinuses: No frontal/maxillary tenderness ENT/Mouth: EACs patent / TMs  nl. Nares clear without erythema, swelling, mucoid exudates. Oral hygiene is good. No erythema, swelling, or exudate. Tongue normal, non-obstructing. Tonsils not swollen or erythematous. Hearing normal.  Neck: Supple, thyroid not palpable. No bruits, nodes or JVD. Respiratory: Respiratory effort normal.  BS equal and clear bilateral without rales, rhonci, wheezing or stridor. Cardio: Heart sounds are normal with regular rate and rhythm and no murmurs, rubs or gallops. Peripheral pulses are normal and equal bilaterally without edema. No aortic or femoral bruits. Chest: symmetric with normal excursions and percussion. Breasts: Symmetric, without lumps, nipple discharge, retractions, or fibrocystic changes.  Abdomen: Flat, soft with bowel sounds active. Nontender, no guarding, rebound, hernias, masses, or organomegaly.  Lymphatics: Non tender without lymphadenopathy.  Genitourinary:  Musculoskeletal: Full ROM all peripheral extremities, joint stability, 5/5 strength, and normal gait. Skin: Warm and dry without rashes, lesions, cyanosis, clubbing or  ecchymosis.  Neuro: Cranial nerves intact, reflexes equal bilaterally. Normal muscle tone, no cerebellar symptoms. Sensation intact.  Pysch: Alert and oriented X 3, normal affect, Insight and Judgment appropriate.    Assessment and Plan  1. Annual Preventative Screening Examination   2. Essential hypertension  - EKG 12-Lead - Urinalysis, Routine w reflex microscopic - Microalbumin / creatinine urine ratio - CBC with Differential/Platelet - COMPLETE METABOLIC PANEL WITH GFR - Magnesium - TSH  3. Hyperlipidemia, mixed  - EKG 12-Lead - Lipid panel - TSH  4. Abnormal glucose  - EKG 12-Lead - Hemoglobin A1c - Insulin, random  5. Vitamin D deficiency  - VITAMIN D 25 Hydroxy   6. Stage 3a chronic  kidney disease (HCC)  - COMPLETE METABOLIC PANEL WITH GFR  7. Aortic atherosclerosis ([place]) by Abd CT scan 02/24/2015  - EKG 12-Lead - Lipid panel   8. Screening for colorectal cancer  - Ambulatory referral to Gastroenterology   9. Screening for heart disease  - EKG 12-Lead  10. Medication management  - CBC with Differential/Platelet - COMPLETE METABOLIC PANEL  WITH GFR - Magnesium - Lipid panel - TSH - Hemoglobin A1c - Insulin, random - VITAMIN D 25 Hydroxy           Patient was counseled in prudent diet to achieve/maintain BMI less than 25 for weight control, BP monitoring, regular exercise and medications. Discussed med's effects and SE's. Screening labs and tests as requested with regular follow-up as recommended. Over 40 minutes of exam, counseling, chart review and high complex critical decision making was performed.   Kirtland Bouchard, MD

## 2022-11-16 LAB — MICROSCOPIC MESSAGE

## 2022-11-16 LAB — LIPID PANEL
Cholesterol: 129 mg/dL (ref <200)
HDL: 42 mg/dL — ABNORMAL LOW (ref >=50)
LDL Cholesterol (Calc): 69 mg/dL (calc)
Non-HDL Cholesterol (Calc): 87 mg/dL (calc) (ref <130)
Total CHOL/HDL Ratio: 3.1 (calc) (ref <5.0)
Triglycerides: 93 mg/dL (ref <150)

## 2022-11-16 LAB — URINALYSIS, ROUTINE W REFLEX MICROSCOPIC
Bacteria, UA: NONE SEEN /HPF
Bilirubin Urine: NEGATIVE
Glucose, UA: NEGATIVE
Hgb urine dipstick: NEGATIVE
Ketones, ur: NEGATIVE
Nitrite: NEGATIVE
Protein, ur: NEGATIVE
Specific Gravity, Urine: 1.016 (ref 1.001–1.035)
pH: 5.5 (ref 5.0–8.0)

## 2022-11-16 LAB — CBC WITH DIFFERENTIAL/PLATELET
Absolute Monocytes: 604 cells/uL (ref 200–950)
Basophils Absolute: 40 cells/uL (ref 0–200)
Basophils Relative: 0.7 %
Eosinophils Absolute: 131 cells/uL (ref 15–500)
Eosinophils Relative: 2.3 %
HCT: 35.8 % (ref 35.0–45.0)
Hemoglobin: 11.7 g/dL (ref 11.7–15.5)
Lymphs Abs: 1322 cells/uL (ref 850–3900)
MCH: 29.3 pg (ref 27.0–33.0)
MCHC: 32.7 g/dL (ref 32.0–36.0)
MCV: 89.5 fL (ref 80.0–100.0)
MPV: 10.2 fL (ref 7.5–12.5)
Monocytes Relative: 10.6 %
Neutro Abs: 3602 cells/uL (ref 1500–7800)
Neutrophils Relative %: 63.2 %
Platelets: 183 10*3/uL (ref 140–400)
RBC: 4 10*6/uL (ref 3.80–5.10)
RDW: 12.4 % (ref 11.0–15.0)
Total Lymphocyte: 23.2 %
WBC: 5.7 10*3/uL (ref 3.8–10.8)

## 2022-11-16 LAB — MICROALBUMIN / CREATININE URINE RATIO
Creatinine, Urine: 164 mg/dL (ref 20–275)
Microalb Creat Ratio: 6 mcg/mg creat (ref <30)
Microalb, Ur: 1 mg/dL

## 2022-11-16 LAB — COMPLETE METABOLIC PANEL WITH GFR
AG Ratio: 1.7 (calc) (ref 1.0–2.5)
ALT: 22 U/L (ref 6–29)
AST: 36 U/L — ABNORMAL HIGH (ref 10–35)
Albumin: 4 g/dL (ref 3.6–5.1)
Alkaline phosphatase (APISO): 74 U/L (ref 37–153)
BUN/Creatinine Ratio: 13 (calc) (ref 6–22)
BUN: 15 mg/dL (ref 7–25)
CO2: 31 mmol/L (ref 20–32)
Calcium: 9.6 mg/dL (ref 8.6–10.4)
Chloride: 106 mmol/L (ref 98–110)
Creat: 1.19 mg/dL — ABNORMAL HIGH (ref 0.60–1.00)
Globulin: 2.3 g/dL (calc) (ref 1.9–3.7)
Glucose, Bld: 80 mg/dL (ref 65–99)
Potassium: 4.3 mmol/L (ref 3.5–5.3)
Sodium: 144 mmol/L (ref 135–146)
Total Bilirubin: 0.3 mg/dL (ref 0.2–1.2)
Total Protein: 6.3 g/dL (ref 6.1–8.1)
eGFR: 49 mL/min/{1.73_m2} — ABNORMAL LOW (ref >=60)

## 2022-11-16 LAB — HEMOGLOBIN A1C
Hgb A1c MFr Bld: 5.6 % of total Hgb (ref <5.7)
Mean Plasma Glucose: 114 mg/dL
eAG (mmol/L): 6.3 mmol/L

## 2022-11-16 LAB — INSULIN, RANDOM: Insulin: 5.5 u[IU]/mL

## 2022-11-16 LAB — VITAMIN D 25 HYDROXY (VIT D DEFICIENCY, FRACTURES): Vit D, 25-Hydroxy: 69 ng/mL (ref 30–100)

## 2022-11-16 LAB — TSH: TSH: 3.41 mIU/L (ref 0.40–4.50)

## 2022-11-16 LAB — MAGNESIUM: Magnesium: 2.4 mg/dL (ref 1.5–2.5)

## 2022-11-16 NOTE — Progress Notes (Signed)
<><><><><><><><><><><><><><><><><><><><><><><><><><><><><><><><><> <><><><><><><><><><><><><><><><><><><><><><><><><><><><><><><><><>  -   Chol = 129 - Great  <><><><><><><><><><><><><><><><><><><><><><><><><><><><><><><><><>  - Thyroid - Normal  <><><><><><><><><><><><><><><><><><><><><><><><><><><><><><><><><>  -  A1c - Normal -No Diabetes !  <><><><><><><><><><><><><><><><><><><><><><><><><><><><><><><><><>  -  Vit D = 69  -Excellent  - Please keep dose same <><><><><><><><><><><><><><><><><><><><><><><><><><><><><><><><><>  -  All Else - CBC - Kidneys - Electrolytes - Liver - Magnesium & Thyroid    - all  Normal / OK <><><><><><><><><><><><><><><><><><><><><><><><><><><><><><><><><> <><><><><><><><><><><><><><><><><><><><><><><><><><><><><><><><><>  - Keep up the Great work  !  <><><><><><><><><><><><><><><><><><><><><><><><><><><><><><><><><> <><><><><><><><><><><><><><><><><><><><><><><><><><><><><><><><><>   -

## 2022-12-12 ENCOUNTER — Other Ambulatory Visit: Payer: Self-pay | Admitting: Internal Medicine

## 2022-12-12 DIAGNOSIS — M7989 Other specified soft tissue disorders: Secondary | ICD-10-CM

## 2023-01-02 NOTE — Progress Notes (Unsigned)
Assessment and Plan:  There are no diagnoses linked to this encounter.    Further disposition pending results of labs. Discussed med's effects and SE's.   Over 30 minutes of exam, counseling, chart review, and critical decision making was performed.   Future Appointments  Date Time Provider Department Center  01/03/2023  3:30 PM Latoya Dick, NP [facility] None  01/25/2023  8:20 AM [facility] MM 3 [facility]  02/19/2023 10:00 AM Latoya Dick, NP [facility] None  05/22/2023 11:30 AM Lucky Cowboy, MD [facility] None  11/20/2023 11:00 AM Lucky Cowboy, MD [facility] None    ------------------------------------------------------------------------------------------------------------------   HPI There were no vitals taken for this visit. 72 y.o.female presents for  Past Medical History:  Diagnosis Date   Anxiety and depression    Arthritis    Colon polyps    GERD (gastroesophageal reflux disease)    Hemorrhoids    Hiatal hernia    History of kidney stones    passed   Hyperlipemia    Mixed hyperlipidemia 11/26/2013   Panic disorder    Passive smoke exposure 07/17/2018   Renal insufficiency    stage 3 kidney disease per her PCP   Stomach ulcer    Unspecified essential hypertension 11/26/2013     Allergies  Allergen Reactions   Other     All pain medications: Unknown/ CODEINE   Prednisone Other (See Comments)    DYSPHORIA   Vioxx [Rofecoxib]     unknown   Entex T [Pseudoephedrine-Guaifenesin] Palpitations   Levaquin [Levofloxacin] Rash    Tolerated Avelox without adverse effect 11/08/15.   Sulfa Antibiotics Rash   Trovan [Alatrofloxacin] Rash    Current Outpatient Medications on File Prior to Visit  Medication Sig   ALPRAZolam (XANAX) 0.5 MG tablet Take 1/2  to 1 tablet 2 to 3 x /day as needed for Nerves   Ascorbic Acid (VITAMIN C) 1000 MG tablet Take 1,000 mg by mouth daily.   bisoprolol (ZEBETA) 10 MG tablet Take 1 tablet every morning for  BP    cholecalciferol (VITAMIN D) 1000 units tablet Take 10,000 Units by mouth daily.   mirtazapine (REMERON) 30 MG tablet Take 1/2 to 1 tablet 1 to 2 hours before Bedtime for Appetite , Depression & Sleep.   olmesartan (BENICAR) 20 MG tablet Take  1 tablet  every Night for  BP   potassium chloride SA (KLOR-CON M) 20 MEQ tablet TAKE 1 TABLET BY MOUTH  DAILY FOR POTASSIUM   rosuvastatin (CRESTOR) 20 MG tablet Take  1 tablet  Daily  for Cholesterol   sertraline (ZOLOFT) 100 MG tablet Take 1 tablet (100 mg total) by mouth daily.   torsemide (DEMADEX) 20 MG tablet Take  1 tablet   2 x / Day for BP & Fluid Retention / Ankle Swelling   vitamin B-12 (CYANOCOBALAMIN) 250 MCG tablet Take 250 mcg by mouth daily.   No current facility-administered medications on file prior to visit.    ROS: all negative except above.   Physical Exam:  There were no vitals taken for this visit.  General Appearance: Well nourished, in no apparent distress. Eyes: PERRLA, EOMs, conjunctiva no swelling or erythema Sinuses: No Frontal/maxillary tenderness ENT/Mouth: Ext aud canals clear, TMs without erythema, bulging. No erythema, swelling, or exudate on post pharynx.  Tonsils not swollen or erythematous. Hearing normal.  Neck: Supple, thyroid normal.  Respiratory: Respiratory effort normal, BS equal bilaterally without rales, rhonchi, wheezing or stridor.  Cardio: RRR with no MRGs. Brisk peripheral  pulses without edema.  Abdomen: Soft, + BS.  Non tender, no guarding, rebound, hernias, masses. Lymphatics: Non tender without lymphadenopathy.  Musculoskeletal: Full ROM, 5/5 strength, normal gait.  Skin: Warm, dry without rashes, lesions, ecchymosis.  Neuro: Cranial nerves intact. Normal muscle tone, no cerebellar symptoms. Sensation intact.  Psych: Awake and oriented X 3, normal affect, Insight and Judgment appropriate.     Latoya Dick, NP 11:12 AM [facility]

## 2023-01-03 ENCOUNTER — Ambulatory Visit (INDEPENDENT_AMBULATORY_CARE_PROVIDER_SITE_OTHER): Payer: Medicare Other | Admitting: Nurse Practitioner

## 2023-01-03 ENCOUNTER — Encounter: Payer: Self-pay | Admitting: Nurse Practitioner

## 2023-01-03 VITALS — BP 122/72 | HR 80 | Temp 97.7°F | Ht 63.0 in | Wt 112.4 lb

## 2023-01-03 DIAGNOSIS — L03113 Cellulitis of right upper limb: Secondary | ICD-10-CM | POA: Diagnosis not present

## 2023-01-03 DIAGNOSIS — S41152A Open bite of left upper arm, initial encounter: Secondary | ICD-10-CM | POA: Diagnosis not present

## 2023-01-03 DIAGNOSIS — W540XXA Bitten by dog, initial encounter: Secondary | ICD-10-CM

## 2023-01-03 DIAGNOSIS — L03114 Cellulitis of left upper limb: Secondary | ICD-10-CM | POA: Diagnosis not present

## 2023-01-03 DIAGNOSIS — S41151A Open bite of right upper arm, initial encounter: Secondary | ICD-10-CM

## 2023-01-03 MED ORDER — AZITHROMYCIN 500 MG PO TABS: 500.0000 mg | ORAL_TABLET | Freq: Every day | ORAL | 0 refills | Status: AC

## 2023-01-03 MED ORDER — MUPIROCIN 2 % EX OINT: 1.0000 | TOPICAL_OINTMENT | Freq: Two times a day (BID) | CUTANEOUS | 0 refills | Status: DC

## 2023-01-03 MED ORDER — DOXYCYCLINE HYCLATE 100 MG PO CAPS: ORAL_CAPSULE | ORAL | 0 refills | Status: AC

## 2023-01-03 NOTE — Progress Notes (Deleted)
Assessment and Plan:  There are no diagnoses linked to this encounter.    Further disposition pending results of labs. Discussed med's effects and SE's.   Over 30 minutes of exam, counseling, chart review, and critical decision making was performed.   Future Appointments  Date Time Provider Department Center  01/08/2023 11:00 AM Raynelle Dick, NP [facility] None  01/25/2023  8:20 AM [facility] MM 3 [facility]  02/19/2023 10:00 AM Raynelle Dick, NP [facility] None  05/22/2023 11:30 AM Lucky Cowboy, MD [facility] None  11/20/2023 11:00 AM Lucky Cowboy, MD [facility] None    ------------------------------------------------------------------------------------------------------------------   HPI There were no vitals taken for this visit. 72 y.o.female presents for  Past Medical History:  Diagnosis Date   Anxiety and depression    Arthritis    Colon polyps    GERD (gastroesophageal reflux disease)    Hemorrhoids    Hiatal hernia    History of kidney stones    passed   Hyperlipemia    Mixed hyperlipidemia 11/26/2013   Panic disorder    Passive smoke exposure 07/17/2018   Renal insufficiency    stage 3 kidney disease per her PCP   Stomach ulcer    Unspecified essential hypertension 11/26/2013     Allergies  Allergen Reactions   Other     All pain medications: Unknown/ CODEINE   Prednisone Other (See Comments)    DYSPHORIA   Vioxx [Rofecoxib]     unknown   Entex T [Pseudoephedrine-Guaifenesin] Palpitations   Levaquin [Levofloxacin] Rash    Tolerated Avelox without adverse effect 11/08/15.   Sulfa Antibiotics Rash   Trovan [Alatrofloxacin] Rash    Current Outpatient Medications on File Prior to Visit  Medication Sig   ALPRAZolam (XANAX) 0.5 MG tablet Take 1/2  to 1 tablet 2 to 3 x /day as needed for Nerves   Ascorbic Acid (VITAMIN C) 1000 MG tablet Take 1,000 mg by mouth daily.   azithromycin (ZITHROMAX) 500 MG tablet Take 1 tablet (500 mg total) by  mouth daily for 10 days.   bisoprolol (ZEBETA) 10 MG tablet Take 1 tablet every morning for  BP   cholecalciferol (VITAMIN D) 1000 units tablet Take 10,000 Units by mouth daily.   doxycycline (VIBRAMYCIN) 100 MG capsule Take 1 capsule twice daily with food   mirtazapine (REMERON) 30 MG tablet Take 1/2 to 1 tablet 1 to 2 hours before Bedtime for Appetite , Depression & Sleep.   mupirocin ointment (BACTROBAN) 2 % Apply 1 Application topically 2 (two) times daily.   olmesartan (BENICAR) 20 MG tablet Take  1 tablet  every Night for  BP   potassium chloride SA (KLOR-CON M) 20 MEQ tablet TAKE 1 TABLET BY MOUTH  DAILY FOR POTASSIUM   rosuvastatin (CRESTOR) 20 MG tablet Take  1 tablet  Daily  for Cholesterol   sertraline (ZOLOFT) 100 MG tablet Take 1 tablet (100 mg total) by mouth daily.   torsemide (DEMADEX) 20 MG tablet Take  1 tablet   2 x / Day for BP & Fluid Retention / Ankle Swelling   vitamin B-12 (CYANOCOBALAMIN) 250 MCG tablet Take 250 mcg by mouth daily.   No current facility-administered medications on file prior to visit.    ROS: all negative except above.   Physical Exam:  There were no vitals taken for this visit.  General Appearance: Well nourished, in no apparent distress. Eyes: PERRLA, EOMs, conjunctiva no swelling or erythema Sinuses: No Frontal/maxillary tenderness ENT/Mouth: Ext aud canals clear,  TMs without erythema, bulging. No erythema, swelling, or exudate on post pharynx.  Tonsils not swollen or erythematous. Hearing normal.  Neck: Supple, thyroid normal.  Respiratory: Respiratory effort normal, BS equal bilaterally without rales, rhonchi, wheezing or stridor.  Cardio: RRR with no MRGs. Brisk peripheral pulses without edema.  Abdomen: Soft, + BS.  Non tender, no guarding, rebound, hernias, masses. Lymphatics: Non tender without lymphadenopathy.  Musculoskeletal: Full ROM, 5/5 strength, normal gait.  Skin: Warm, dry without rashes, lesions, ecchymosis.  Neuro: Cranial  nerves intact. Normal muscle tone, no cerebellar symptoms. Sensation intact.  Psych: Awake and oriented X 3, normal affect, Insight and Judgment appropriate.     Raynelle Dick, NP 4:33 PM [facility]

## 2023-01-03 NOTE — Patient Instructions (Signed)
Clean with peroxide daily and apply mupirocin twice a day to the areas  If develop fever or bad smelling drainage from cuts please go to ER  Use Azithromycin once a day for 10 days  Use Doxycycline 100 mg twice a day for 10 days  Follow up here on Monday  Animal Bite, Adult Animal bites can be mild or serious. Small bites from house pets normally are mild. Bites from cats, strays, or wild animals can be serious. If a stray or wild animal bites you, you need to get medical help right away. You may also need a shot to prevent rabies infection. What increases the risk? Being near pets you do not know. Being near animals that are eating, sleeping, or caring for their babies. Being outside where small, wild animals move freely. What are the signs or symptoms? Pain. Bleeding. Swelling. Bruising. How is this treated? Treatment may include: Cleaning your wound. Rinsing out (flushing) your wound. This uses saline solution, which is made of salt and water. Putting a bandage on your wound. Closing your wound with stitches (sutures), staples, skin glue, or skin tape (adhesive strips). Antibiotic medicine. You may be given pills, cream, gel, or fluid through an IV. A tetanus shot. Rabies treatment, if the animal could have rabies. Surgery, if there is infection or damage that needs to be fixed. Follow these instructions at home: Medicines Take or apply over-the-counter and prescription medicines only as told by your doctor. If you were prescribed an antibiotic medicine, take or apply it as told by your doctor. Do not stop using it even if your wound gets better. Wound care  Follow instructions from your doctor about how to take care of your wound. Make sure you: Wash your hands with soap and water for at least 20 seconds before and after you change your bandage. If you cannot use soap and water, use hand sanitizer. Change your bandage. Leave stitches or skin glue in place for at least 2  weeks. Leave tape strips alone unless you are told to take them off. You may trim the edges of the tape strips if they curl up. Check your wound every day for signs of infection. Check for: More redness, swelling, or pain. More fluid or blood. Warmth. Pus or a bad smell. General instructions  Raise (elevate) the injured area above the level of your heart while you are sitting or lying down. If told, put ice on the injured area. To do this: Put ice in a plastic bag. Place a towel between your skin and the bag. Leave the ice on for 20 minutes, 2-3 times per day. Take off the ice if your skin turns bright red. This is very important. If you cannot feel pain, heat, or cold, you have a greater risk of damage to the area. Keep all follow-up visits. Contact a doctor if: You have more redness, swelling, or pain around your wound. Your wound feels warm to the touch. You have a fever or chills. You have a general feeling of sickness (malaise). You feel like you may vomit. You vomit. You have pain that does not get better. Get help right away if: You have a red streak going away from your wound. You have any of these coming from your wound: Non-clear fluid. More blood. Pus or a bad smell. You have trouble moving your injured area. You lose feeling (have numbness) or feel tingling anywhere on your body. Summary Animal bites can be mild or serious. If a  stray or wild animal bites you, you need to get medical help right away. Your doctor will look at the wound and may ask about how the animal bite happened. Treatment may include wound care, antibiotic medicine, a tetanus shot, and rabies treatment. This information is not intended to replace advice given to you by your health care provider. Make sure you discuss any questions you have with your health care provider. Document Revised: 08/26/2021 Document Reviewed: 08/26/2021 Elsevier Patient Education  2023 [employer].

## 2023-01-08 ENCOUNTER — Ambulatory Visit: Payer: Medicare Other | Admitting: Nurse Practitioner

## 2023-01-17 ENCOUNTER — Other Ambulatory Visit: Payer: Self-pay | Admitting: Internal Medicine

## 2023-01-17 DIAGNOSIS — I10 Essential (primary) hypertension: Secondary | ICD-10-CM

## 2023-01-17 DIAGNOSIS — I872 Venous insufficiency (chronic) (peripheral): Secondary | ICD-10-CM

## 2023-01-25 ENCOUNTER — Ambulatory Visit: Payer: Medicare Other

## 2023-01-26 ENCOUNTER — Encounter: Payer: Self-pay | Admitting: Internal Medicine

## 2023-01-31 ENCOUNTER — Other Ambulatory Visit: Payer: Self-pay | Admitting: Internal Medicine

## 2023-01-31 DIAGNOSIS — S41152A Open bite of left upper arm, initial encounter: Secondary | ICD-10-CM

## 2023-01-31 DIAGNOSIS — L03113 Cellulitis of right upper limb: Secondary | ICD-10-CM

## 2023-01-31 DIAGNOSIS — L03114 Cellulitis of left upper limb: Secondary | ICD-10-CM

## 2023-01-31 DIAGNOSIS — S41151A Open bite of right upper arm, initial encounter: Secondary | ICD-10-CM

## 2023-01-31 MED ORDER — HYDROXYZINE HCL 50 MG PO TABS: ORAL_TABLET | ORAL | 0 refills | Status: DC

## 2023-02-10 ENCOUNTER — Other Ambulatory Visit: Payer: Self-pay | Admitting: Internal Medicine

## 2023-02-10 DIAGNOSIS — E782 Mixed hyperlipidemia: Secondary | ICD-10-CM

## 2023-02-10 MED ORDER — ROSUVASTATIN CALCIUM 20 MG PO TABS: ORAL_TABLET | ORAL | 3 refills | Status: AC

## 2023-02-14 ENCOUNTER — Ambulatory Visit: Payer: Medicare Other

## 2023-02-16 NOTE — Progress Notes (Unsigned)
AWV and follow up  Assessment and Plan:  Encounter for Medicare Annual Wellness Exam with Abnormal Findings Due annually  Health maintenance reviewed Mammogram was scheduled for 07/06/22 at 1:20 pm at [facility]- pt did not complete   Essential hypertension - continue medications, DASH diet, exercise and monitor at home. Call if greater than 130/80.  -     CBC with Differential/Platelet -     CMP/GFR   Mixed hyperlipidemia -continue medications, check lipids, decrease fatty foods, increase activity.  -     Lipid panel  Aortic atherosclerosis (HCC) - per CT 02/2015 Control blood pressure, cholesterol, glucose, increase exercise.   Multiple lentigines syndrome  Continue to monitor  Multiple thyroid nodules Dr. Elvera Lennox was following and recommended 5 years of annual thyroid US Has not seen Dr Elvera Lennox since 05/2021 and has had no further U/S since 2020 - TSH  CKD (chronic kidney disease) stage 3a Increase fluids, avoid NSAIDS, monitor sugars, will monitor - CMP, CBC  Other abnormal glucose Discussed general issues about diabetes pathophysiology and management., Educational material distributed., Suggested low cholesterol diet., Encouraged aerobic exercise., Discussed foot care., Reminded to get yearly retinal exam.  Hiatal hernia Continue GERD medication, follow up GI as needed Encouraged small meals, avoid fatty meals, avoid lying down after meals   Chronic rhinitis Allergy pill daily, nasal sprays, increase H20, allergy hygiene explained.  Gastroesophageal reflux disease, esophagitis presence not specified/LPR Continue H2 blocker, Add diet discussed - Magnesium  Generalized anxiety disorder Continue medications; doing well limiting benzo  stress management techniques discussed, increase water, good sleep hygiene discussed, increase exercise, and increase veggies.   Vitamin D deficiency At goal at recent check; continue to recommend supplementation for goal of  70-100 Defer vitamin D level  Chronic cough syndrome Continue GERD meds, follow up Dr. Sherene Sires as needed.   Osteoporosis DEXA due 06/2021,T score -2.9 Has been declining alendronate, would consider pending results, otherwise will defer to Dr. Elvera Lennox for consideration of prolia Discussed Vit D with K2 supplement and Calcium rich diet, weight bearing exercises  Senile Purpura(HCC) Moisturize skin, clothes protection Sent in triamcinolone to use as needed Monitor  Skin tear of left upper arm Use antibiotic ointment and allow to heal naturally  Medication Management - Magnesium  Edema Pt is uncertain if she is taking Furosemide or Torsemide Advised to check when whe gets home and call with which prescription she uses so we can adjust in her chart     Over 30 minutes of face to face exam, counseling, chart review and critical decision making was performed Future Appointments  Date Time Provider Department Center  05/22/2023 11:30 AM Lucky Cowboy, MD [facility] None  11/20/2023 11:00 AM Lucky Cowboy, MD [facility] None  02/20/2024 10:30 AM Raynelle Dick, NP [facility] None    Plan:   During the course of the visit the patient was educated and counseled about appropriate screening and preventive services including:   Pneumococcal vaccine  Prevnar 13 Influenza vaccine Td vaccine Screening electrocardiogram Bone densitometry screening Colorectal cancer screening Diabetes screening Glaucoma screening Nutrition counseling  Advanced directives: requested    Subjective:  Latoya [name] is a 72 y.o. female who presents for AWV and follow up. She has Esophageal reflux; Generalized anxiety disorder; Chronic cough; Essential hypertension; Hyperlipidemia, mixed; Abnormal glucose (prediabetes); Vitamin D deficiency; Medication management; Aortic atherosclerosis (HCC) by Abd CT scan 02/24/2015; Chronic rhinitis; Laryngopharyngeal reflux (LPR); Multiple lentigines syndrome;  Osteoporosis; Recurrent acute serous otitis media of right ear; History  of eustachian tube dysfunction; TB lung, latent; Tenderness of both temporomandibular joints; Multiple thyroid nodules; H/O thyroid disease; and CKD (chronic kidney disease) stage 2, GFR 68 ml/min on their problem list.    Husband passed in 2021 with late stage lung disease, has 2 grown daughters.  She  has a diagnosis of depression/anxiety and is currently on xanax 0.5 mg PRN, uses rarely, avoids taking daily. She feels doing better recently. Last fill was 02/03/22 Aplrazolam 0.5 mg #60   PDMP appropriate. Sleep is hard to fall asleep.  She will fall asleep at 2-4 AM will sleep longer in the morning, getting better.    She has GERD/LPR, had EGD 10/2017, currently not on medication- was prescribed Famotidine through  Dr. Leone Payor. Colonoscopy is due and will call Dr Leone Payor to schedule She also has dx of chronic cough syndrome per Dr. Sherene Sires after extensive workup for persistent cough. She was notably evaluated by [facility] ENT Dr. Scot Jun 05/12/2020 due to recurrent serous otitis and ear complaints; he diagnosed with eustachian tube dysfunction. He recommended she utilize Flonase and astalin nasal sprays PRN.   BMI is Body mass index is 20.19 kg/m., she is very active around yard but admits to eating better  Knows she needs to do better.  Wt Readings from Last 3 Encounters:  02/19/23 114 lb (51.7 kg)  01/03/23 112 lb 6.4 oz (51 kg)  11/15/22 107 lb (48.5 kg)   Her blood pressure has been controlled at home,Currently on bisoprolol 10 mg and olmesartan 20 mg QD today their BP is BP: 138/80   BP Readings from Last 3 Encounters:  02/19/23 138/80  01/03/23 122/72  11/15/22 130/68  She does workout, walking, yard work, house work. She denies chest pain, shortness of breath, dizziness.  She has aortic atherosclerosis per CT 02/2015.   She does have senile purpura- ecchymoses of forearms and legs.   She is on cholesterol  medication (rosuvastatin 20 mg daily) and denies myalgias. Her cholesterol is at LDL goal. The cholesterol last visit was:   Lab Results  Component Value Date   CHOL 129 11/15/2022   HDL 42 (L) 11/15/2022   LDLCALC 69 11/15/2022   TRIG 93 11/15/2022   CHOLHDL 3.1 11/15/2022    She has been working on diet and exercise for glucose management, and denies paresthesia of the feet, polydipsia, polyuria and visual disturbances. Last A1C in the office was:  Lab Results  Component Value Date   HGBA1C 5.6 11/15/2022   Has had CKD IIIa range predating 2015 per lab review, She has not been pushing warter as much. Last GFR: Lab Results  Component Value Date   EGFR 49 (L) 11/15/2022    Patient is on Vitamin D supplement for deficency.   Lab Results  Component Value Date   VD25OH 69 11/15/2022     She has multinodular thyroid, last [place] 11/2020, now following with Dr. Elvera Lennox, planning annual [place] follow up. Recent thyroid panel was normal.  Lab Results  Component Value Date   TSH 3.41 11/15/2022     Medication Review: Current Outpatient Medications on File Prior to Visit  Medication Sig   ALPRAZolam (XANAX) 0.5 MG tablet Take 1/2  to 1 tablet 2 to 3 x /day as needed for Nerves   Ascorbic Acid (VITAMIN C) 1000 MG tablet Take 1,000 mg by mouth daily.   bisoprolol (ZEBETA) 10 MG tablet Take 1 tablet every morning for  BP   cholecalciferol (VITAMIN D) 1000 units tablet  Take 10,000 Units by mouth daily.   doxycycline (VIBRAMYCIN) 100 MG capsule Take 1 capsule twice daily with food   hydrOXYzine (ATARAX) 50 MG tablet Take 1 to 2 tablets 3 to 4 x /day as need for Nerves  / Anxiety   mirtazapine (REMERON) 30 MG tablet Take 1/2 to 1 tablet 1 to 2 hours before Bedtime for Appetite , Depression & Sleep.   mupirocin ointment (BACTROBAN) 2 % Apply 1 Application topically 2 (two) times daily.   olmesartan (BENICAR) 20 MG tablet Take  1 tablet  every Night for  BP   potassium chloride SA (KLOR-CON M) 20  MEQ tablet TAKE 1 TABLET BY MOUTH  DAILY FOR POTASSIUM   rosuvastatin (CRESTOR) 20 MG tablet Take  1 tablet  Daily  for Cholesterol                              /                                                                   TAKE                                         BY                                                 MOUTH   sertraline (ZOLOFT) 100 MG tablet Take 1 tablet (100 mg total) by mouth daily.   torsemide (DEMADEX) 20 MG tablet TAKE 1 TABLET BY MOUTH EVERY MORNING FOR FLUID RETENTION OR LEG SWELLING   vitamin B-12 (CYANOCOBALAMIN) 250 MCG tablet Take 250 mcg by mouth daily.   No current facility-administered medications on file prior to visit.     Allergies  Allergen Reactions   Other     All pain medications: Unknown/ CODEINE   Prednisone Other (See Comments)    DYSPHORIA   Vioxx [Rofecoxib]     unknown   Entex T [Pseudoephedrine-Guaifenesin] Palpitations   Levaquin [Levofloxacin] Rash    Tolerated Avelox without adverse effect 11/08/15.   Sulfa Antibiotics Rash   Trovan [Alatrofloxacin] Rash    Current Problems (verified) Patient Active Problem List   Diagnosis Date Noted   CKD (chronic kidney disease) stage 2, GFR 68 ml/min 03/02/2022   Multiple thyroid nodules 05/13/2021   H/O thyroid disease 05/13/2021   Tenderness of both temporomandibular joints 10/13/2020   TB lung, latent 06/21/2020   Recurrent acute serous otitis media of right ear 05/19/2020   History of eustachian tube dysfunction 05/19/2020   Osteoporosis 06/23/2019   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   Laryngopharyngeal reflux (LPR) 10/12/2016   Chronic rhinitis 10/09/2016   Aortic atherosclerosis (HCC) by Abd CT scan 02/24/2015 06/30/2016   Abnormal glucose (prediabetes) 05/01/2014   Vitamin D deficiency 05/01/2014   Medication management 05/01/2014   Essential hypertension 11/26/2013   Hyperlipidemia, mixed 11/26/2013   Chronic cough 09/19/2013   Generalized anxiety disorder 07/11/2013    Esophageal reflux  11/29/2012    Screening Tests Immunization History  Administered Date(s) Administered   Influenza Split 06/04/2014, 06/05/2015   Influenza, High Dose Seasonal PF 05/30/2017, 05/22/2018, 05/21/2019, 05/26/2021, 06/06/2022   Influenza-Unspecified 06/06/2013, 06/13/2016   Pneumococcal Conjugate-13 06/05/2015   Pneumococcal Polysaccharide-23 04/06/2014   Pneumococcal-Unspecified 09/04/1996, 09/04/2016   Td 09/04/2005   Tdap 03/22/2017   Preventative care: Last colonoscopy: 12/2012 Leone Payor, 10 year follow up EGD 10/2017  Last mammogram: 06/23/2020- will call to schedule Last pap smear/pelvic exam: 2016 normal   DEXA :06/23/2021 T score -2.9  US thyroid 11/19/2020, multinodular thyroid, Dr. Elvera Lennox now following with annual [place] Health Maintenance  Topic Date Due   COVID-19 Vaccine (1) 03/07/2023 (Originally 05/03/1956)   MAMMOGRAM  05/22/2023 (Originally 06/23/2022)   Zoster Vaccines- Shingrix (1 of 2) 05/22/2023 (Originally 05/02/1970)   Colonoscopy  08/21/2023 (Originally 12/13/2022)   INFLUENZA VACCINE  04/05/2023   DEXA SCAN  06/24/2023   Medicare Annual Wellness (AWV)  02/19/2024   DTaP/Tdap/Td (3 - Td or Tdap) 03/23/2027   Hepatitis C Screening  Completed   HPV VACCINES  Aged Out   Pneumonia Vaccine 54+ Years old  Discontinued     Names of Other Physician/Practitioners you currently use: 1. [facility] here for primary care 2. [facility], eye doctor, last visit 24/2022 3. [employer], dentist, last visit 2020, Due, encouraged to schedule   Patient Care Team: Lucky Cowboy, MD as PCP - General (Internal Medicine) Doristine Counter, MD as Referring Physician (Psychiatry) Nils Flack, MD as Referring Physician (Dermatology) Sundra Aland, [facility] as Pharmacist (Pharmacist)  SURGICAL HISTORY She  has a past surgical history that includes Abdominal hysterectomy; Tubal ligation; Foot surgery (Left, 2008);  Colonoscopy; Upper gastrointestinal endoscopy; Oophorectomy (Right); Carpometacarpel [facility]) suspension plasty (Right, 12/08/2013); I & D extremity (Right, 01/20/2017); and Hand surgery (Right). FAMILY HISTORY Her family history includes Asthma in her mother; CVA in her brother; Dementia in her brother; Heart Problems in her maternal grandfather; Heart attack in her brother; Heart disease in her brother and father; Other in her brother; Pancreatic cancer in her brother; Tuberculosis in her daughter. SOCIAL HISTORY She  reports that she has never smoked. She has been exposed to tobacco smoke. She has never used smokeless tobacco. She reports that she does not drink alcohol and does not use drugs. MEDICARE WELLNESS OBJECTIVES: Physical activity: Current Exercise Habits: Home exercise routine, Type of exercise: walking (Yard work), Time (Minutes): 60, Frequency (Times/Week): 7, Weekly Exercise (Minutes/Week): 420, Intensity: Moderate Cardiac risk factors: Cardiac Risk Factors include: advanced age (>89men, >70 women);dyslipidemia;hypertension Depression/mood screen:      02/19/2023   10:23 AM  Depression screen PHQ 2/9  Decreased Interest 0  Down, Depressed, Hopeless 0  PHQ - 2 Score 0    ADLs:     02/19/2023   10:24 AM 10/23/2022   12:23 AM  In your present state of health, do you have any difficulty performing the following activities:  Hearing? 0 0  Vision? 0 0  Difficulty concentrating or making decisions? 0 0  Walking or climbing stairs? 0 0  Dressing or bathing? 0 0  Doing errands, shopping? 0      Cognitive Testing  Alert? Yes  Normal Appearance?Yes  Oriented to person? Yes  Place? Yes   Time? Yes  Recall of three objects?  Yes  Can perform simple calculations? Yes  Displays appropriate judgment?Yes  Can read the correct time from a watch face?Yes  EOL planning: Does Patient  Have a Medical Advance Directive?: Yes Type of Advance Directive: Healthcare Power of Attorney, Living  will Does patient want to make changes to medical advance directive?: No - Patient declined Copy of Healthcare Power of Attorney in Chart?: No - copy requested    Depression/mood screen:      02/19/2023   10:23 AM  Depression screen PHQ 2/9  Decreased Interest 0  Down, Depressed, Hopeless 0  PHQ - 2 Score 0      Review of Systems  Constitutional:  Negative for malaise/fatigue and weight loss.  HENT:  Positive for hearing loss (R, has number to schedule for hearing aids). Negative for tinnitus.   Eyes:  Negative for blurred vision and double vision.  Respiratory:  Negative for cough, shortness of breath and wheezing.   Cardiovascular:  Negative for chest pain, palpitations, orthopnea, claudication and leg swelling.  Gastrointestinal:  Negative for abdominal pain, blood in stool, constipation, diarrhea, heartburn, melena, nausea and vomiting.  Genitourinary: Negative.   Musculoskeletal:  Positive for back pain (occasional) and joint pain (knees). Negative for myalgias.  Skin:  Negative for rash.       Ecchymoses, skin tear left upper arm  Neurological:  Negative for dizziness, tingling, sensory change, weakness and headaches.  Endo/Heme/Allergies:  Negative for environmental allergies and polydipsia. Bruises/bleeds easily.  Psychiatric/Behavioral: Negative.  Negative for depression (improved with meds), memory loss, substance abuse and suicidal ideas. The patient is not nervous/anxious and does not have insomnia.   All other systems reviewed and are negative.   Objective:     Today's Vitals   02/19/23 1008  BP: 138/80  Pulse: 91  Resp: 16  Temp: 97.6 F (36.4 C)  SpO2: 99%  Weight: 114 lb (51.7 kg)  Height: 5\' 3"  (1.6 m)   Body mass index is 20.19 kg/m.  General appearance: Pleasant thin female, no distress HEENT: normocephalic, sclerae anicteric, TMs pearly, nares patent/ pale nasal mucosa, no discharge or erythema, pharynx normal. Poor dentition Oral cavity: MMM, no  lesions Neck: supple, no lymphadenopathy, no thyromegaly, no masses Heart: RRR, normal S1, S2, no murmurs Lungs: CTA bilaterally, no wheezes, rhonchi, or rales Abdomen: +bs, soft, non-tender, non distended, no masses, no hepatomegaly,  Splenomegaly noted.   Musculoskeletal: Full ROM all extremities, nontender, no swelling, no obvious deformity. Non-antalgic gait.  SKIN: warm, dry. Multiple skin ecchymoses. Skin tear of right upper arm Extremities: no edema, no cyanosis, no clubbing Pulses: 2+ symmetric, upper and lower extremities, normal cap refill Neurological: alert, oriented x 3, CN2-12 intact, strength normal upper extremities and lower extremities, sensation normal throughout, DTRs 2+  no cerebellar signs. Psychiatric: Normal affect, behavior normal, pleasant    The patient's weight, height, BMI, and visual acuity have been recorded in the chart.  I have made referrals, counseling, and provided education to the patient based on review of the above and I have provided the patient with a written personalized care plan for preventive services.     Raynelle Dick, NP 10:33 AM [facility]

## 2023-02-19 ENCOUNTER — Encounter: Payer: Self-pay | Admitting: Nurse Practitioner

## 2023-02-19 ENCOUNTER — Ambulatory Visit (INDEPENDENT_AMBULATORY_CARE_PROVIDER_SITE_OTHER): Payer: Medicare Other | Admitting: Nurse Practitioner

## 2023-02-19 VITALS — BP 138/80 | HR 91 | Temp 97.6°F | Resp 16 | Ht 63.0 in | Wt 114.0 lb

## 2023-02-19 DIAGNOSIS — I700 Atherosclerosis of aorta: Secondary | ICD-10-CM | POA: Diagnosis not present

## 2023-02-19 DIAGNOSIS — S41112A Laceration without foreign body of left upper arm, initial encounter: Secondary | ICD-10-CM

## 2023-02-19 DIAGNOSIS — Z0001 Encounter for general adult medical examination with abnormal findings: Secondary | ICD-10-CM | POA: Diagnosis not present

## 2023-02-19 DIAGNOSIS — E782 Mixed hyperlipidemia: Secondary | ICD-10-CM

## 2023-02-19 DIAGNOSIS — E042 Nontoxic multinodular goiter: Secondary | ICD-10-CM

## 2023-02-19 DIAGNOSIS — K449 Diaphragmatic hernia without obstruction or gangrene: Secondary | ICD-10-CM

## 2023-02-19 DIAGNOSIS — Q8789 Other specified congenital malformation syndromes, not elsewhere classified: Secondary | ICD-10-CM

## 2023-02-19 DIAGNOSIS — I10 Essential (primary) hypertension: Secondary | ICD-10-CM

## 2023-02-19 DIAGNOSIS — K219 Gastro-esophageal reflux disease without esophagitis: Secondary | ICD-10-CM

## 2023-02-19 DIAGNOSIS — E559 Vitamin D deficiency, unspecified: Secondary | ICD-10-CM

## 2023-02-19 DIAGNOSIS — N1831 Chronic kidney disease, stage 3a: Secondary | ICD-10-CM

## 2023-02-19 DIAGNOSIS — D692 Other nonthrombocytopenic purpura: Secondary | ICD-10-CM | POA: Diagnosis not present

## 2023-02-19 DIAGNOSIS — J310 Chronic rhinitis: Secondary | ICD-10-CM

## 2023-02-19 DIAGNOSIS — R6889 Other general symptoms and signs: Secondary | ICD-10-CM | POA: Diagnosis not present

## 2023-02-19 DIAGNOSIS — M810 Age-related osteoporosis without current pathological fracture: Secondary | ICD-10-CM

## 2023-02-19 DIAGNOSIS — Z79899 Other long term (current) drug therapy: Secondary | ICD-10-CM | POA: Diagnosis not present

## 2023-02-19 DIAGNOSIS — R7309 Other abnormal glucose: Secondary | ICD-10-CM

## 2023-02-19 DIAGNOSIS — L814 Other melanin hyperpigmentation: Secondary | ICD-10-CM

## 2023-02-19 DIAGNOSIS — R600 Localized edema: Secondary | ICD-10-CM

## 2023-02-19 DIAGNOSIS — F411 Generalized anxiety disorder: Secondary | ICD-10-CM

## 2023-02-19 DIAGNOSIS — R053 Chronic cough: Secondary | ICD-10-CM

## 2023-02-19 NOTE — Patient Instructions (Addendum)
YOU CAN CALL TO MAKE A THYROID ULTRASOUND..  I have put in an order for an ultrasound for you to have You can set them up at your convenience by calling this number [phone] You will likely have the ultrasound at [address] [facility]  If you have any issues call our office and we will set this up for you.    Schedule Mammogram at the breast center [email or web address] [address], [place], [place] [address]  2.2 mi [phone]  Call Dr. Leone Payor for Colonoscopy

## 2023-02-20 LAB — CBC WITH DIFFERENTIAL/PLATELET
Absolute Monocytes: 557 cells/uL (ref 200–950)
Basophils Absolute: 32 cells/uL (ref 0–200)
Basophils Relative: 0.6 %
Eosinophils Absolute: 122 cells/uL (ref 15–500)
Eosinophils Relative: 2.3 %
HCT: 35.2 % (ref 35.0–45.0)
Hemoglobin: 11 g/dL — ABNORMAL LOW (ref 11.7–15.5)
Lymphs Abs: 1410 cells/uL (ref 850–3900)
MCH: 29.1 pg (ref 27.0–33.0)
MCHC: 31.3 g/dL — ABNORMAL LOW (ref 32.0–36.0)
MCV: 93.1 fL (ref 80.0–100.0)
MPV: 10 fL (ref 7.5–12.5)
Monocytes Relative: 10.5 %
Neutro Abs: 3180 cells/uL (ref 1500–7800)
Neutrophils Relative %: 60 %
Platelets: 209 10*3/uL (ref 140–400)
RBC: 3.78 10*6/uL — ABNORMAL LOW (ref 3.80–5.10)
RDW: 14.3 % (ref 11.0–15.0)
Total Lymphocyte: 26.6 %
WBC: 5.3 10*3/uL (ref 3.8–10.8)

## 2023-02-20 LAB — COMPLETE METABOLIC PANEL WITH GFR
AG Ratio: 1.8 (calc) (ref 1.0–2.5)
ALT: 20 U/L (ref 6–29)
AST: 28 U/L (ref 10–35)
Albumin: 3.8 g/dL (ref 3.6–5.1)
Alkaline phosphatase (APISO): 78 U/L (ref 37–153)
BUN: 16 mg/dL (ref 7–25)
CO2: 27 mmol/L (ref 20–32)
Calcium: 9.1 mg/dL (ref 8.6–10.4)
Chloride: 108 mmol/L (ref 98–110)
Creat: 0.94 mg/dL (ref 0.60–1.00)
Globulin: 2.1 g/dL (calc) (ref 1.9–3.7)
Glucose, Bld: 55 mg/dL — ABNORMAL LOW (ref 65–99)
Potassium: 4.4 mmol/L (ref 3.5–5.3)
Sodium: 141 mmol/L (ref 135–146)
Total Bilirubin: 0.3 mg/dL (ref 0.2–1.2)
Total Protein: 5.9 g/dL — ABNORMAL LOW (ref 6.1–8.1)
eGFR: 64 mL/min/{1.73_m2} (ref >=60)

## 2023-02-20 LAB — LIPID PANEL
Cholesterol: 130 mg/dL (ref <200)
HDL: 50 mg/dL (ref >=50)
LDL Cholesterol (Calc): 62 mg/dL (calc)
Non-HDL Cholesterol (Calc): 80 mg/dL (calc) (ref <130)
Total CHOL/HDL Ratio: 2.6 (calc) (ref <5.0)
Triglycerides: 97 mg/dL (ref <150)

## 2023-02-20 LAB — MAGNESIUM: Magnesium: 2.2 mg/dL (ref 1.5–2.5)

## 2023-02-20 LAB — TSH: TSH: 1.8 mIU/L (ref 0.40–4.50)

## 2023-02-23 ENCOUNTER — Ambulatory Visit
Admission: RE | Admit: 2023-02-23 | Discharge: 2023-02-23 | Disposition: A | Discharge status: Home / Self Care | Payer: Medicare Other | Source: Ambulatory Visit | Attending: Nurse Practitioner | Admitting: Nurse Practitioner

## 2023-02-23 DIAGNOSIS — E042 Nontoxic multinodular goiter: Secondary | ICD-10-CM | POA: Diagnosis not present

## 2023-02-26 ENCOUNTER — Ambulatory Visit: Payer: Medicare Other

## 2023-02-27 ENCOUNTER — Ambulatory Visit
Admission: RE | Admit: 2023-02-27 | Discharge: 2023-02-27 | Disposition: A | Discharge status: Home / Self Care | Payer: Medicare Other | Source: Ambulatory Visit | Attending: Internal Medicine | Admitting: Internal Medicine

## 2023-02-27 DIAGNOSIS — Z1231 Encounter for screening mammogram for malignant neoplasm of breast: Secondary | ICD-10-CM

## 2023-03-27 ENCOUNTER — Encounter: Payer: Self-pay | Admitting: Nurse Practitioner

## 2023-03-27 ENCOUNTER — Ambulatory Visit (INDEPENDENT_AMBULATORY_CARE_PROVIDER_SITE_OTHER): Payer: Medicare Other | Admitting: Nurse Practitioner

## 2023-03-27 VITALS — BP 138/82 | HR 63 | Temp 97.7°F | Ht 63.0 in | Wt 117.4 lb

## 2023-03-27 DIAGNOSIS — L539 Erythematous condition, unspecified: Secondary | ICD-10-CM | POA: Diagnosis not present

## 2023-03-27 DIAGNOSIS — R600 Localized edema: Secondary | ICD-10-CM | POA: Diagnosis not present

## 2023-03-27 DIAGNOSIS — S80812A Abrasion, left lower leg, initial encounter: Secondary | ICD-10-CM

## 2023-03-27 MED ORDER — DOXYCYCLINE HYCLATE 100 MG PO TABS: 100.0000 mg | ORAL_TABLET | Freq: Two times a day (BID) | ORAL | 0 refills | Status: AC

## 2023-03-27 NOTE — Progress Notes (Signed)
Assessment and Plan:  Latoya [name] was seen today for an episodic visit.  Diagnoses and all order for this visit:  Abrasion of left lower extremity, initial encounter Continue to apply triple antibiotic cream to affected area. Continue to monitor for increase in spreading of redness, streaking, swelling, uncontrolled pain, N/V, abdominal pain, fever. Notify office if s/s fail to improve  - doxycycline (VIBRA-TABS) 100 MG tablet; Take 1 tablet (100 mg total) by mouth 2 (two) times daily for 7 days.  Dispense: 14 tablet; Refill: 0  Localized edema Elevated LLE daily to reduce swelling Apply ICE PRN Continue to monitor  Erythema Continue to monitor for worsening erythema.   Notify office for further evaluation and treatment, questions or concerns if s/s fail to improve. The risks and benefits of my recommendations, as well as other treatment options were discussed with the patient today. Questions were answered.  Further disposition pending results of labs. Discussed med's effects and SE's.    Over 15 minutes of exam, counseling, chart review, and critical decision making was performed.   Future Appointments  Date Time Provider Department Center  05/22/2023 11:30 AM Lucky Cowboy, MD [facility] None  11/20/2023 11:00 AM Lucky Cowboy, MD [facility] None  02/20/2024 10:30 AM Raynelle Dick, NP [facility] None    ------------------------------------------------------------------------------------------------------------------   HPI BP 138/82   Pulse 63   Temp 97.7 F (36.5 C)   Ht 5\' 3"  (1.6 m)   Wt 117 lb 6.4 oz (53.3 kg)   SpO2 99%   BMI 20.80 kg/m   72 y.o.female presents for evaluation of LLE abrasion after stating that she dropped a grill part on her leg x 4 days ago.  She has been applying Neosporin cream and clensing with Peroxide.  She has started to notice more redness and pain in the area.  No discharge.  Some mild localized edema. She denies fever,  chills, N/V.    Past Medical History:  Diagnosis Date   Anxiety and depression    Arthritis    Colon polyps    GERD (gastroesophageal reflux disease)    Hemorrhoids    Hiatal hernia    History of kidney stones    passed   Hyperlipemia    Mixed hyperlipidemia 11/26/2013   Panic disorder    Passive smoke exposure 07/17/2018   Renal insufficiency    stage 3 kidney disease per her PCP   Stomach ulcer    Unspecified essential hypertension 11/26/2013     Allergies  Allergen Reactions   Other     All pain medications: Unknown/ CODEINE   Prednisone Other (See Comments)    DYSPHORIA   Vioxx [Rofecoxib]     unknown   Entex T [Pseudoephedrine-Guaifenesin] Palpitations   Levaquin [Levofloxacin] Rash    Tolerated Avelox without adverse effect 11/08/15.   Sulfa Antibiotics Rash   Trovan [Alatrofloxacin] Rash    Current Outpatient Medications on File Prior to Visit  Medication Sig   ALPRAZolam (XANAX) 0.5 MG tablet Take 1/2  to 1 tablet 2 to 3 x /day as needed for Nerves   Ascorbic Acid (VITAMIN C) 1000 MG tablet Take 1,000 mg by mouth daily.   bisoprolol (ZEBETA) 10 MG tablet Take 1 tablet every morning for  BP   cholecalciferol (VITAMIN D) 1000 units tablet Take 10,000 Units by mouth daily.   doxycycline (VIBRAMYCIN) 100 MG capsule Take 1 capsule twice daily with food   hydrOXYzine (ATARAX) 50 MG tablet Take 1 to 2 tablets 3 to  4 x /day as need for Nerves  / Anxiety   mirtazapine (REMERON) 30 MG tablet Take 1/2 to 1 tablet 1 to 2 hours before Bedtime for Appetite , Depression & Sleep.   mupirocin ointment (BACTROBAN) 2 % Apply 1 Application topically 2 (two) times daily.   olmesartan (BENICAR) 20 MG tablet Take  1 tablet  every Night for  BP   potassium chloride SA (KLOR-CON M) 20 MEQ tablet TAKE 1 TABLET BY MOUTH  DAILY FOR POTASSIUM   rosuvastatin (CRESTOR) 20 MG tablet Take  1 tablet  Daily  for Cholesterol                              /                                                                    TAKE                                         BY                                                 MOUTH   sertraline (ZOLOFT) 100 MG tablet Take 1 tablet (100 mg total) by mouth daily.   torsemide (DEMADEX) 20 MG tablet TAKE 1 TABLET BY MOUTH EVERY MORNING FOR FLUID RETENTION OR LEG SWELLING   vitamin B-12 (CYANOCOBALAMIN) 250 MCG tablet Take 250 mcg by mouth daily.   No current facility-administered medications on file prior to visit.    ROS: all negative except what is noted in the HPI.   Physical Exam:  BP 138/82   Pulse 63   Temp 97.7 F (36.5 C)   Ht 5\' 3"  (1.6 m)   Wt 117 lb 6.4 oz (53.3 kg)   SpO2 99%   BMI 20.80 kg/m   General Appearance: NAD.  Awake, conversant and cooperative. Eyes: PERRLA, EOMs intact.  Sclera white.  Conjunctiva without erythema. Sinuses: No frontal/maxillary tenderness.  No nasal discharge. Nares patent.  ENT/Mouth: Ext aud canals clear.  Bilateral TMs w/DOL and without erythema or bulging. Hearing intact.  Posterior pharynx without swelling or exudate.  Tonsils without swelling or erythema.  Neck: Supple.  No masses, nodules or thyromegaly. Respiratory: Effort is regular with non-labored breathing. Breath sounds are equal bilaterally without rales, rhonchi, wheezing or stridor.  Cardio: RRR with no MRGs. Brisk peripheral pulses without edema.  Abdomen: Active BS in all four quadrants.  Soft and non-tender without guarding, rebound tenderness, hernias or masses. Lymphatics: Non tender without lymphadenopathy.  Musculoskeletal: Full ROM, 5/5 strength, normal ambulation.  No clubbing or cyanosis. Skin:  LLE with approximately 4 in oval shaped superficial abrasion, intact.  [place] to red discoloration to wound.  Dry.  Surrounding area with mild erythematous edging and mild edema.  Tender to palpation.  No streaking or warmth.  Appropriate color for ethnicity.  Neuro: CN II-XII grossly normal. Normal muscle tone without cerebellar  symptoms  and intact sensation.   Psych: AO X 3,  appropriate mood and affect, insight and judgment.     Adela Glimpse, NP 9:57 AM [facility]

## 2023-03-27 NOTE — Patient Instructions (Signed)

## 2023-04-17 ENCOUNTER — Other Ambulatory Visit: Payer: Self-pay | Admitting: Internal Medicine

## 2023-04-17 DIAGNOSIS — R634 Abnormal weight loss: Secondary | ICD-10-CM

## 2023-04-17 DIAGNOSIS — F339 Major depressive disorder, recurrent, unspecified: Secondary | ICD-10-CM

## 2023-05-04 DIAGNOSIS — M25561 Pain in right knee: Secondary | ICD-10-CM | POA: Diagnosis not present

## 2023-05-22 ENCOUNTER — Encounter: Payer: Self-pay | Admitting: Internal Medicine

## 2023-05-22 ENCOUNTER — Other Ambulatory Visit: Payer: Self-pay | Admitting: Internal Medicine

## 2023-05-22 ENCOUNTER — Ambulatory Visit: Payer: Medicare Other | Admitting: Internal Medicine

## 2023-05-22 DIAGNOSIS — I10 Essential (primary) hypertension: Secondary | ICD-10-CM

## 2023-05-22 NOTE — Progress Notes (Signed)
Latoya [name]                                                                                                                                                                                               Future Appointments  Date Time Provider Department  05/22/2023 11:30 AM Lucky Cowboy, MD [facility]  11/20/2023 11:00 AM Lucky Cowboy, MD [facility]  02/20/2024 10:30 AM Raynelle Dick, NP [facility]    History of Present Illness:      This very nice 72 y.o.  WWF presents for 6 month follow up with HTN, HLD, Pre-Diabetes and Vitamin [name] Deficiency.        Patient has hx/o Chronic Anxiety & Depression & has been followed by Dr Donell Beers in the past.      Patient is treated for HTN (1985)  & BP has been controlled at home. Today's BP is at goal - 124/82. Patient has had no complaints of any cardiac type chest pain, palpitations, dyspnea / orthopnea / PND, dizziness, claudication, but does have chronic dependent edema apparently not responding to low dose lasix.      Hyperlipidemia is  NOT controlled with diet & meds. Patient denies myalgias or other med SE's. Last Lipids were  at goal:  Lab Results  Component Value Date   CHOL 130 02/19/2023   HDL 50 02/19/2023   LDLCALC 62 02/19/2023   TRIG 97 02/19/2023   CHOLHDL 2.6 02/19/2023     Also, the patient has history of PreDiabetes (A1c 5.8% /2014 & 5.7% /2015 w/elevated Insulin 40)  and has had no symptoms of reactive hypoglycemia, diabetic polys, paresthesias or visual blurring.  Last A1c was  near goal:  Lab Results  Component Value Date   HGBA1C 5.6 11/15/2022                                                        Further, the patient also has history of Vitamin [name] Deficiency and supplements vitamin [name] without any  suspected side-effects. Last vitamin [name] was at goal:  Lab Results  Component Value Date   VD25OH 69 11/15/2022        Current Outpatient Medications on File Prior to Visit  Medication Sig   alendronate (FOSAMAX) 70 MG tablet Take 1 tablet  1 x /week  aspirin 81 MG tablet Take 81 mg by mouth at bedtime.    cholecalciferol (VITAMIN [name]) 1000 units tablet Take 10,000 Units by mouth daily.   clonazePAM (KLONOPIN) 1 MG tablet clonazepam 1 mg tablet   diltiazem (CARDIZEM) 120 MG tablet Take 1 tablet 2 x /day for BP   famotidine (PEPCID) 20 MG tablet 20 mg daily. As needed.   fexofenadine (ALLEGRA) 180 MG tablet Take 1 tablet (180 mg total) by mouth daily.   FLONASE  nasal spray 2 SPRAYS AT BEDTIME    furosemide (LASIX) 20 MG tablet Take 1 tablet ( daily as needed for edema.)   meclizine (ANTIVERT) 25 MG tablet 1/2-1 pill up to 3 times daily    K-DUR 20 MEQ tablet Take 1 tablet 2 x /day for Potassium   pravastatin (PRAVACHOL) 40 MG tablet Take 1 tablet Daily for Cholesterol   promethazine-DM) 6.25-15 MG/5ML syrup TAKE 5 ML  4 x / DAILY    propranolol (INDERAL) 10 MG tablet TAKE 1 TABLET TWICE DAILY FOR ANXIETY   sertraline (ZOLOFT) 100 MG tablet Take 1 tablet Daily for  Mood     Allergies  Allergen Reactions   Other     All pain medications: Unknown/ CODEINE   Prednisone Other (See Comments)    DYSPHORIA   Vioxx [Rofecoxib]     unknown   Entex T [Pseudoephedrine-Guaifenesin] Palpitations   Levaquin [Levofloxacin] Rash    Tolerated Avelox without adverse effect 11/08/15.   Sulfa Antibiotics Rash   Trovan [Alatrofloxacin] Rash    PMHx:   Past Medical History:  Diagnosis Date   Anxiety and depression    Arthritis    Colon polyps    GERD (gastroesophageal reflux disease)    Hemorrhoids    Hiatal hernia    History of kidney stones    passed   Hyperlipemia    Mixed hyperlipidemia 11/26/2013   Panic disorder    Passive smoke exposure 07/17/2018   Renal insufficiency    stage  3 kidney disease per her PCP   Stomach ulcer    Unspecified essential hypertension 11/26/2013    Immunization History  Administered Date(s) Administered   Influenza Split 06/04/2014, 06/05/2015   Influenza, High Dose Seasonal PF 05/30/2017, 05/22/2018, 05/21/2019   Influenza-Unspecified 06/06/2013, 06/13/2016   Pneumococcal Conjugate-13 06/05/2015   Pneumococcal Polysaccharide-23 04/06/2014   Pneumococcal-Unspecified 09/04/1996, 09/04/2016   Td 09/04/2005   Tdap 03/22/2017    Past Surgical History:  Procedure Laterality Date   ABDOMINAL HYSTERECTOMY     CARPOMETACARPEL SUSPENSION PLASTY Right 12/08/2013   Procedure: CARPOMETACARPEL [facility]) SUSPENSION PLASTY;  Surgeon: Jodi Marble, MD;  Location: [facility];  Service: Orthopedics;  Laterality: Right;   CATARACT EXTRACTION Right 2020   Dr. Shari Heritage    COLONOSCOPY     FOOT SURGERY Left 2008   Bunion and pulled tendons   HAND SURGERY Right    Dog Bite   I & [name] EXTREMITY Right 01/20/2017   Procedure: RIGHT HAND IRRIGATION AND DEBRIDEMENT AND REPAIR AS INDICATED;  Surgeon: Bradly Bienenstock, MD;  Location: [facility];  Service: Orthopedics;  Laterality: Right;   OOPHORECTOMY Right    TUBAL LIGATION     UPPER GASTROINTESTINAL ENDOSCOPY      FHx:    Reviewed / unchanged  SHx:    Reviewed / unchanged   Systems Review:  Constitutional: Denies fever, chills, wt changes, headaches, insomnia, fatigue, night sweats, change in appetite. Eyes: Denies redness, blurred vision, diplopia, discharge, itchy, watery eyes.  ENT: Denies discharge, congestion, post nasal drip, epistaxis, sore throat, earache, hearing loss, dental pain, tinnitus, vertigo, sinus pain, snoring.  CV: Denies chest pain, palpitations, irregular heartbeat, syncope, dyspnea, diaphoresis, orthopnea, PND, claudication or edema. Respiratory: denies cough, dyspnea, DOE, pleurisy, hoarseness, laryngitis, wheezing.  Gastrointestinal: Denies dysphagia, odynophagia, heartburn,  reflux, water brash, abdominal pain or cramps, nausea, vomiting, bloating, diarrhea, constipation, hematemesis, melena, hematochezia  or hemorrhoids. Genitourinary: Denies dysuria, frequency, urgency, nocturia, hesitancy, discharge, hematuria or flank pain. Musculoskeletal: Denies arthralgias, myalgias, stiffness, jt. swelling, pain, limping or strain/sprain.  Skin: Denies pruritus, rash, hives, warts, acne, eczema or change in skin lesion(s). Neuro: No weakness, tremor, incoordination, spasms, paresthesia or pain. Psychiatric: Denies confusion, memory loss or sensory loss. Endo: Denies change in weight, skin or hair change.  Heme/Lymph: No excessive bleeding, bruising or enlarged lymph nodes.  Physical Exam  There were no vitals taken for this visit.  Appears  well nourished, well groomed  and in no distress.  Eyes: PERRLA, EOMs, conjunctiva no swelling or erythema. Sinuses: No frontal/maxillary tenderness ENT/Mouth: EAC's clear, TM's nl w/o erythema, bulging. Nares clear w/o erythema, swelling, exudates. Oropharynx clear without erythema or exudates. Oral hygiene is good. Tongue normal, non obstructing. Hearing intact.  Neck: Supple. Thyroid not palpable. Car 2+/2+ without bruits, nodes or JVD. Chest: Respirations nl with BS clear & equal w/o rales, rhonchi, wheezing or stridor.  Cor: Heart sounds normal w/ regular rate and rhythm without sig. murmurs, gallops, clicks or rubs. Peripheral pulses normal and equal  with 1-2 + pretibial edema to knees  ( L>Latoya) .  Abdomen: Soft & bowel sounds normal. Non-tender w/o guarding, rebound, hernias, masses or organomegaly.  Lymphatics: Unremarkable.  Musculoskeletal: Full ROM all peripheral extremities, joint stability, 5/5 strength and normal gait.  Skin: Warm, dry without exposed rashes, lesions or ecchymosis apparent.  Neuro: Cranial nerves intact, reflexes equal bilaterally. Sensory-motor testing grossly intact. Tendon reflexes grossly intact.   Pysch: Alert & oriented x 3.  Insight and judgement nl & appropriate. No ideations.  Assessment and Plan:  1. Essential hypertension  - Continue medication, monitor blood pressure at home.  - Continue DASH diet.  Reminder to go to the ER if any CP,  SOB, nausea, dizziness, severe HA, changes vision/speech.  - Magnesium - TSH  2. Hyperlipidemia, mixed  - Continue diet/meds, exercise,& lifestyle modifications.  - Continue monitor periodic cholesterol/liver & renal functions   - Lipid panel - TSH  3. Abnormal glucose  - Continue diet, exercise  - Lifestyle modifications.  - Monitor appropriate labs.  - Hemoglobin A1c - Insulin, random  4. Vitamin [name] deficiency  - Continue supplementation.  - VITAMIN [name] 25 Hydroxy  5. Medication management  - Magnesium - Lipid panel - TSH - Hemoglobin A1c - Insulin, random - VITAMIN [name] 25 Hydroxy         Discussed  regular exercise, BP monitoring, weight control to achieve/maintain BMI less than 25 and discussed med and SE's. Recommended labs to assess and monitor clinical status with further disposition pending results of labs.  I discussed the assessment and treatment plan with the patient. The patient was provided an opportunity to ask questions and all were answered. The patient agreed with the plan and demonstrated an understanding of the instructions.  I provided over 30 minutes of exam, counseling, chart review and  complex critical decision making.   Marinus Maw, MD

## 2023-05-27 ENCOUNTER — Encounter: Payer: Self-pay | Admitting: Internal Medicine

## 2023-06-07 ENCOUNTER — Encounter: Payer: Self-pay | Admitting: Internal Medicine

## 2023-06-07 NOTE — Progress Notes (Unsigned)
T R  A  N  S  F ER  R  E D   To Tonya's Schedule       History of Present Illness:      This very nice 72 y.o.  WWF presents for 6 month follow up with HTN, HLD, Pre-Diabetes and Vitamin D Deficiency.        Patient has hx/o Chronic Anxiety & Depression & has been followed by Dr Donell Beers in the past.       Patient is treated for HTN (1985)  & BP has been controlled at home. Today's BP is at goal - 124/82. Patient has had no complaints of any cardiac type chest pain, palpitations, dyspnea / orthopnea / PND, dizziness, claudication, but does have chronic dependent edema apparently not responding to low dose lasix.        Hyperlipidemia is  NOT controlled with diet & meds. Patient denies myalgias or other med SE's. Last Lipids were  at goal:  Lab Results  Component Value Date   CHOL 130 02/19/2023   HDL 50 02/19/2023   LDLCALC 62 02/19/2023   TRIG 97 02/19/2023   CHOLHDL 2.6 02/19/2023     Also, the patient has history of PreDiabetes (A1c 5.8% /2014 & 5.7% /2015 w/elevated Insulin 40)  and has had no symptoms of reactive hypoglycemia, diabetic polys, paresthesias or visual blurring.  Last A1c was  near goal:  Lab Results  Component Value Date   HGBA1C 5.6 11/15/2022                                                        Further, the patient also has history of Vitamin D Deficiency and supplements vitamin D without any suspected side-effects. Last vitamin D was at goal:  Lab Results  Component Value Date   VD25OH 69 11/15/2022       Current Outpatient Medications on File Prior to Visit  Medication Sig   alendronate (FOSAMAX) 70 MG tablet Take 1 tablet  1 x /week    aspirin 81 MG tablet Take 81 mg by mouth at bedtime.    cholecalciferol (VITAMIN D) 1000 units tablet Take 10,000 Units by mouth daily.   clonazePAM (KLONOPIN) 1 MG tablet clonazepam 1 mg tablet   diltiazem (CARDIZEM) 120 MG tablet Take 1 tablet 2 x /day for BP   famotidine (PEPCID) 20 MG tablet 20  mg daily. As needed.   fexofenadine (ALLEGRA) 180 MG tablet Take 1 tablet (180 mg total) by mouth daily.   FLONASE  nasal spray 2 SPRAYS AT BEDTIME    furosemide (LASIX) 20 MG tablet Take 1 tablet ( daily as needed for edema.)   meclizine (ANTIVERT) 25 MG tablet 1/2-1 pill up to 3 times daily    K-DUR 20 MEQ tablet Take 1 tablet 2 x /day for Potassium   pravastatin (PRAVACHOL) 40 MG tablet Take 1 tablet Daily for Cholesterol   promethazine-DM) 6.25-15 MG/5ML syrup TAKE 5 ML  4 x / DAILY    propranolol (INDERAL) 10 MG tablet TAKE 1 TABLET TWICE DAILY FOR ANXIETY   sertraline (ZOLOFT) 100 MG tablet Take 1 tablet Daily for  Mood     Allergies  Allergen Reactions   Other     All pain medications: Unknown/ CODEINE   Prednisone Other (See  Comments)    DYSPHORIA   Vioxx [Rofecoxib]     unknown   Entex T [Pseudoephedrine-Guaifenesin] Palpitations   Levaquin [Levofloxacin] Rash    Tolerated Avelox without adverse effect 11/08/15.   Sulfa Antibiotics Rash   Trovan [Alatrofloxacin] Rash     PMHx:   Past Medical History:  Diagnosis Date   Anxiety and depression    Arthritis    Colon polyps    GERD (gastroesophageal reflux disease)    Hemorrhoids    Hiatal hernia    History of kidney stones    passed   Hyperlipemia    Mixed hyperlipidemia 11/26/2013   Panic disorder    Passive smoke exposure 07/17/2018   Renal insufficiency    stage 3 kidney disease per her PCP   Stomach ulcer    Unspecified essential hypertension 11/26/2013     Immunization History  Administered Date(s) Administered   Influenza Split 06/04/2014, 06/05/2015   Influenza, High Dose 05/30/2017, 05/22/2018, 05/21/2019   Influenza- 06/06/2013, 06/13/2016   Pneumococcal -13 06/05/2015   Pneumococcal -23 04/06/2014   Pneumococcal-23 09/04/1996, 09/04/2016   Td 09/04/2005   Tdap 03/22/2017     Past Surgical History:  Procedure Laterality Date   ABDOMINAL HYSTERECTOMY     CARPOMETACARPEL SUSPENSION PLASTY  Right 12/08/2013   Procedure: CARPOMETACARPEL [facility]) SUSPENSION PLASTY;  Surgeon: Jodi Marble, MD;  Location: [facility];  Service: Orthopedics;  Laterality: Right;   CATARACT EXTRACTION Right 2020   Dr. Shari Heritage    COLONOSCOPY     FOOT SURGERY Left 2008   Bunion and pulled tendons   HAND SURGERY Right    Dog Bite   I & D EXTREMITY Right 01/20/2017   Procedure: RIGHT HAND IRRIGATION AND DEBRIDEMENT AND REPAIR AS INDICATED;  Surgeon: Bradly Bienenstock, MD;  Location: [facility];  Service: Orthopedics;  Laterality: Right;   OOPHORECTOMY Right    TUBAL LIGATION     UPPER GASTROINTESTINAL ENDOSCOPY       FHx:    Reviewed / unchanged   SHx:    Reviewed / unchanged    Systems Review:  Constitutional: Denies fever, chills, wt changes, headaches, insomnia, fatigue, night sweats, change in appetite. Eyes: Denies redness, blurred vision, diplopia, discharge, itchy, watery eyes.  ENT: Denies discharge, congestion, post nasal drip, epistaxis, sore throat, earache, hearing loss, dental pain, tinnitus, vertigo, sinus pain, snoring.  CV: Denies chest pain, palpitations, irregular heartbeat, syncope, dyspnea, diaphoresis, orthopnea, PND, claudication or edema. Respiratory: denies cough, dyspnea, DOE, pleurisy, hoarseness, laryngitis, wheezing.  Gastrointestinal: Denies dysphagia, odynophagia, heartburn, reflux, water brash, abdominal pain or cramps, nausea, vomiting, bloating, diarrhea, constipation, hematemesis, melena, hematochezia  or hemorrhoids. Genitourinary: Denies dysuria, frequency, urgency, nocturia, hesitancy, discharge, hematuria or flank pain. Musculoskeletal: Denies arthralgias, myalgias, stiffness, jt. swelling, pain, limping or strain/sprain.  Skin: Denies pruritus, rash, hives, warts, acne, eczema or change in skin lesion(s). Neuro: No weakness, tremor, incoordination, spasms, paresthesia or pain. Psychiatric: Denies confusion, memory loss or sensory loss. Endo: Denies change  in weight, skin or hair change.  Heme/Lymph: No excessive bleeding, bruising or enlarged lymph nodes.   Physical Exam  There were no vitals taken for this visit.  Appears  well nourished, well groomed  and in no distress.  Eyes: PERRLA, EOMs, conjunctiva no swelling or erythema. Sinuses: No frontal/maxillary tenderness ENT/Mouth: EAC's clear, TM's nl w/o erythema, bulging. Nares clear w/o erythema, swelling, exudates. Oropharynx clear without erythema or exudates. Oral hygiene is good. Tongue normal, non obstructing. Hearing intact.  Neck:  Supple. Thyroid not palpable. Car 2+/2+ without bruits, nodes or JVD. Chest: Respirations nl with BS clear & equal w/o rales, rhonchi, wheezing or stridor.  Cor: Heart sounds normal w/ regular rate and rhythm without sig. murmurs, gallops, clicks or rubs. Peripheral pulses normal and equal  with 1-2 + pretibial edema to knees  ( L>R) .  Abdomen: Soft & bowel sounds normal. Non-tender w/o guarding, rebound, hernias, masses or organomegaly.  Lymphatics: Unremarkable.  Musculoskeletal: Full ROM all peripheral extremities, joint stability, 5/5 strength and normal gait.  Skin: Warm, dry without exposed rashes, lesions or ecchymosis apparent.  Neuro: Cranial nerves intact, reflexes equal bilaterally. Sensory-motor testing grossly intact. Tendon reflexes grossly intact.  Pysch: Alert & oriented x 3.  Insight and judgement nl & appropriate. No ideations.  Assessment and Plan:   1. Essential hypertension  - Continue medication, monitor blood pressure at home.  - Continue DASH diet.  Reminder to go to the ER if any CP,  SOB, nausea, dizziness, severe HA, changes vision/speech.  - Magnesium - TSH   2. Hyperlipidemia, mixed  - Continue diet/meds, exercise,& lifestyle modifications.  - Continue monitor periodic cholesterol/liver & renal functions   - Lipid panel - TSH   3. Abnormal glucose  - Continue diet, exercise  - Lifestyle modifications.   - Monitor appropriate labs.  - Hemoglobin A1c - Insulin, random   4. Vitamin D deficiency  - Continue supplementation.  - VITAMIN D 25 Hydroxy   5. Medication management  - Magnesium - Lipid panel - TSH - Hemoglobin A1c - Insulin, random - VITAMIN D 25 Hydroxy          Discussed  regular exercise, BP monitoring, weight control to achieve/maintain BMI less than 25 and discussed med and SE's. Recommended labs to assess and monitor clinical status with further disposition pending results of labs.  I discussed the assessment and treatment plan with the patient. The patient was provided an opportunity to ask questions and all were answered. The patient agreed with the plan and demonstrated an understanding of the instructions.  I provided over 30 minutes of exam, counseling, chart review and  complex critical decision making.   Marinus Maw, MD

## 2023-06-07 NOTE — Patient Instructions (Signed)

## 2023-06-08 ENCOUNTER — Ambulatory Visit: Payer: Medicare Other | Admitting: Nurse Practitioner

## 2023-06-08 ENCOUNTER — Ambulatory Visit: Payer: Medicare Other | Admitting: Internal Medicine

## 2023-06-08 ENCOUNTER — Encounter: Payer: Self-pay | Admitting: Nurse Practitioner

## 2023-06-08 VITALS — BP 140/90 | HR 60 | Temp 97.9°F | Ht 63.0 in | Wt 120.4 lb

## 2023-06-08 DIAGNOSIS — Z79899 Other long term (current) drug therapy: Secondary | ICD-10-CM | POA: Diagnosis not present

## 2023-06-08 DIAGNOSIS — I700 Atherosclerosis of aorta: Secondary | ICD-10-CM | POA: Diagnosis not present

## 2023-06-08 DIAGNOSIS — K449 Diaphragmatic hernia without obstruction or gangrene: Secondary | ICD-10-CM | POA: Diagnosis not present

## 2023-06-08 DIAGNOSIS — E042 Nontoxic multinodular goiter: Secondary | ICD-10-CM | POA: Diagnosis not present

## 2023-06-08 DIAGNOSIS — Q8789 Other specified congenital malformation syndromes, not elsewhere classified: Secondary | ICD-10-CM

## 2023-06-08 DIAGNOSIS — Z23 Encounter for immunization: Secondary | ICD-10-CM | POA: Diagnosis not present

## 2023-06-08 DIAGNOSIS — N182 Chronic kidney disease, stage 2 (mild): Secondary | ICD-10-CM

## 2023-06-08 DIAGNOSIS — K219 Gastro-esophageal reflux disease without esophagitis: Secondary | ICD-10-CM | POA: Diagnosis not present

## 2023-06-08 DIAGNOSIS — R053 Chronic cough: Secondary | ICD-10-CM

## 2023-06-08 DIAGNOSIS — E559 Vitamin D deficiency, unspecified: Secondary | ICD-10-CM | POA: Diagnosis not present

## 2023-06-08 DIAGNOSIS — M810 Age-related osteoporosis without current pathological fracture: Secondary | ICD-10-CM

## 2023-06-08 DIAGNOSIS — I10 Essential (primary) hypertension: Secondary | ICD-10-CM

## 2023-06-08 DIAGNOSIS — E782 Mixed hyperlipidemia: Secondary | ICD-10-CM

## 2023-06-08 DIAGNOSIS — R7309 Other abnormal glucose: Secondary | ICD-10-CM

## 2023-06-08 DIAGNOSIS — F411 Generalized anxiety disorder: Secondary | ICD-10-CM

## 2023-06-08 DIAGNOSIS — L814 Other melanin hyperpigmentation: Secondary | ICD-10-CM

## 2023-06-08 DIAGNOSIS — J310 Chronic rhinitis: Secondary | ICD-10-CM

## 2023-06-08 DIAGNOSIS — D692 Other nonthrombocytopenic purpura: Secondary | ICD-10-CM

## 2023-06-08 NOTE — Progress Notes (Unsigned)
FOLLOW UP  Assessment and Plan:   Essential hypertension Discussed DASH (Dietary Approaches to Stop Hypertension) DASH diet is lower in sodium than a typical American diet. Cut back on foods that are high in saturated fat, cholesterol, and trans fats. Eat more whole-grain foods, fish, poultry, and nuts Remain active and exercise as tolerated daily.  Monitor BP at home-Call if greater than 130/80.  Check CMP/CBC  Mixed hyperlipidemia/Aortic atherosclerosis (HCC) - per CT 02/2015 Discussed lifestyle modifications. Recommended diet heavy in fruits and veggies, omega 3's. Decrease consumption of animal meats, cheeses, and dairy products. Remain active and exercise as tolerated. Continue to monitor. Check lipids/TSH  Multiple lentigines syndrome  Continue to monitor  Multiple thyroid nodules Dr. Elvera Lennox was following and recommended 5 years of annual thyroid US Followed with Dr Elvera Lennox   UTD US Thyroid 02/2023 - no longer need to follow Continue to monitor   CKD (chronic kidney disease) stage 3a Discussed how what you eat and drink can aide in kidney protection. Stay well hydrated. Avoid high salt foods. Avoid NSAIDS. Keep BP and BG well controlled.   Take medications as prescribed. Remain active and exercise as tolerated daily. Maintain weight.  Continue to monitor. Check CMP/GFR/Microablumin  Other abnormal glucose Education: Reviewed 'ABCs' of diabetes management  Discussed goals to be met and/or maintained include A1C (<7) Blood pressure (<130/80) Cholesterol (LDL <70) Continue Eye Exam yearly  Continue Dental Exam Q6 mo Discussed dietary recommendations Discussed Physical Activity recommendations Check A1C   Hiatal hernia Continue GERD medication, follow up GI as needed Encouraged small meals, avoid fatty meals, avoid lying down after meals   Chronic rhinitis Allergy pill daily, nasal sprays, increase H20, allergy hygiene explained. Avoid triggers - 1 TBSP  local honey can be effective.  Gastroesophageal reflux disease, esophagitis presence not specified/LPR No suspected reflux complications (Barret/stricture). Lifestyle modification:  wt loss, avoid meals 2-3h before bedtime. Consider eliminating food triggers:  chocolate, caffeine, EtOH, acid/spicy food.  Generalized anxiety disorder Continue medications; doing well limiting benzo  Stress management techniques discussed, increase water, good sleep hygiene discussed, increase exercise, and increase veggies.   Vitamin D deficiency At goal at recent check; continue to recommend supplementation for goal of 70-100 Defer vitamin D level  Chronic cough syndrome Continue GERD meds, follow up Dr. Sherene Sires as needed.   Osteoporosis DEXA 06/2021,T score -2.9 Pursue a combination of weight-bearing exercises and strength training. Advised on fall prevention measures including proper lighting in all rooms, removal of area rugs and floor clutter, use of walking devices as deemed appropriate, avoidance of uneven walking surfaces. Smoking cessation and moderate alcohol consumption if applicable Consume 800 to 1000 IU of vitamin D daily with a goal vitamin D serum value of 30 ng/mL or higher. Aim for 1000 to 1200 mg of elemental calcium daily through supplements and/or dietary sources.  Senile Purpura(HCC) Moisturize skin, clothes protection Sent in triamcinolone to use as needed Monitor  Medication Management All medications discussed and reviewed in full. All questions and concerns regarding medications addressed.    Flu vaccine need Administered - patient tolerated well  Orders Placed This Encounter  Procedures   Flu vaccine HIGH DOSE PF    Future Appointments  Date Time Provider Department Center  11/20/2023 11:00 AM Lucky Cowboy, MD [facility] None  02/20/2024 10:30 AM Raynelle Dick, NP [facility] None    Subjective:  Latoya [name] is a 72 y.o. female who presents for a follow  up. She has Esophageal reflux; Generalized anxiety  disorder; Chronic cough; Essential hypertension; Hyperlipidemia, mixed; Abnormal glucose (prediabetes); Vitamin D deficiency; Medication management; Aortic atherosclerosis (HCC) by Abd CT scan 02/24/2015; Chronic rhinitis; Laryngopharyngeal reflux (LPR); Multiple lentigines syndrome; Osteoporosis; Recurrent acute serous otitis media of right ear; History of eustachian tube dysfunction; TB lung, latent; Tenderness of both temporomandibular joints; Multiple thyroid nodules; H/O thyroid disease; and CKD (chronic kidney disease) stage 2, GFR 68 ml/min on their problem list.  Overall she reports feeling well today.  She has no new concerns at this time.   Husband passed in 2021 with late stage lung disease, has 2 grown daughters.  She  has a diagnosis of depression/anxiety and is currently on xanax 0.5 mg PRN, uses rarely, avoids taking daily. She feels doing better recently.   She has GERD/LPR, had EGD 10/2017, currently not on medication- was prescribed Famotidine through  Dr. Leone Payor.  She also has dx of chronic cough syndrome per Dr. Sherene Sires after extensive workup for persistent cough. She was notably evaluated by [facility]NT Dr. Scot Jun 05/12/2020 due to recurrent serous otitis and ear complaints; he diagnosed with eustachian tube dysfunction. He recommended she utilize Flonase and astalin nasal sprays PRN.   BMI is Body mass index is 21.33 kg/m., she is very active around yard but admits to eating better  Knows she needs to do better.  Wt Readings from Last 3 Encounters:  06/08/23 120 lb 6.4 oz (54.6 kg)  03/27/23 117 lb 6.4 oz (53.3 kg)  02/19/23 114 lb (51.7 kg)   Her blood pressure has been controlled at home,Currently on bisoprolol 10 mg and olmesartan 20 mg QD today their BP is BP: (!) 140/90   BP Readings from Last 3 Encounters:  06/08/23 (!) 140/90  03/27/23 138/82  02/19/23 138/80  She does workout, walking, yard work, house work. She  denies chest pain, shortness of breath, dizziness.  She has aortic atherosclerosis per CT 02/2015.   She does have senile purpura- ecchymoses of forearms and legs.   She is on cholesterol medication (rosuvastatin 20 mg daily) and denies myalgias. Her cholesterol is at LDL goal. The cholesterol last visit was:   Lab Results  Component Value Date   CHOL 156 06/08/2023   HDL 61 06/08/2023   LDLCALC 79 06/08/2023   TRIG 81 06/08/2023   CHOLHDL 2.6 06/08/2023    She has been working on diet and exercise for glucose management, and denies paresthesia of the feet, polydipsia, polyuria and visual disturbances. Last A1C in the office was:  Lab Results  Component Value Date   HGBA1C 5.7 (H) 06/08/2023   Has had CKD IIIa range predating 2015 per lab review, She has not been pushing warter as much. Last GFR: Lab Results  Component Value Date   EGFR 57 (L) 06/08/2023    Patient is on Vitamin D supplement for deficency.   Lab Results  Component Value Date   VD25OH 42 06/08/2023     She has multinodular thyroid, last [place] 11/2020, now following with Dr. Elvera Lennox, planning annual [place] follow up. Recent thyroid panel was normal.  Lab Results  Component Value Date   TSH 3.19 06/08/2023     Medication Review: Current Outpatient Medications on File Prior to Visit  Medication Sig   Ascorbic Acid (VITAMIN C) 1000 MG tablet Take 1,000 mg by mouth daily.   bisoprolol (ZEBETA) 10 MG tablet Take 1 tablet every morning for  BP   cholecalciferol (VITAMIN D) 1000 units tablet Take 10,000 Units by mouth daily.  hydrOXYzine (ATARAX) 50 MG tablet Take 1 to 2 tablets 3 to 4 x /day as need for Nerves  / Anxiety   mirtazapine (REMERON) 30 MG tablet TAKE 1/2 TO 1 TABLET BY MOUTH EVERY NIGHT 1 TO 2 HOURS BEFORE BEDTIME FOR APPETITE OR DEPRESSION OR SLEEP   olmesartan (BENICAR) 20 MG tablet TAKE 1 TABLET BY MOUTH EVERY NIGHT FOR BLOOD PRESSURE   potassium chloride SA (KLOR-CON M) 20 MEQ tablet TAKE 1 TABLET BY  MOUTH  DAILY FOR POTASSIUM   rosuvastatin (CRESTOR) 20 MG tablet Take  1 tablet  Daily  for Cholesterol                              /                                                                   TAKE                                         BY                                                 MOUTH   sertraline (ZOLOFT) 100 MG tablet Take 1 tablet (100 mg total) by mouth daily.   torsemide (DEMADEX) 20 MG tablet TAKE 1 TABLET BY MOUTH EVERY MORNING FOR FLUID RETENTION OR LEG SWELLING   vitamin B-12 (CYANOCOBALAMIN) 250 MCG tablet Take 250 mcg by mouth daily.   ALPRAZolam (XANAX) 0.5 MG tablet Take 1/2  to 1 tablet 2 to 3 x /day as needed for Nerves (Patient not taking: Reported on 06/08/2023)   doxycycline (VIBRAMYCIN) 100 MG capsule Take 1 capsule twice daily with food   mupirocin ointment (BACTROBAN) 2 % Apply 1 Application topically 2 (two) times daily. (Patient not taking: Reported on 06/08/2023)   No current facility-administered medications on file prior to visit.     Allergies  Allergen Reactions   Other     All pain medications: Unknown/ CODEINE   Prednisone Other (See Comments)    DYSPHORIA   Vioxx [Rofecoxib]     unknown   Entex T [Pseudoephedrine-Guaifenesin] Palpitations   Levaquin [Levofloxacin] Rash    Tolerated Avelox without adverse effect 11/08/15.   Sulfa Antibiotics Rash   Trovan [Alatrofloxacin] Rash    Current Problems (verified) Patient Active Problem List   Diagnosis Date Noted   CKD (chronic kidney disease) stage 2, GFR 68 ml/min 03/02/2022   Multiple thyroid nodules 05/13/2021   H/O thyroid disease 05/13/2021   Tenderness of both temporomandibular joints 10/13/2020   TB lung, latent 06/21/2020   Recurrent acute serous otitis media of right ear 05/19/2020   History of eustachian tube dysfunction 05/19/2020   Osteoporosis 06/23/2019   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   Laryngopharyngeal reflux (LPR) 10/12/2016   Chronic rhinitis 10/09/2016    Aortic atherosclerosis (HCC) by Abd CT scan 02/24/2015 06/30/2016   Abnormal glucose (prediabetes) 05/01/2014   Vitamin D deficiency 05/01/2014  Medication management 05/01/2014   Essential hypertension 11/26/2013   Hyperlipidemia, mixed 11/26/2013   Chronic cough 09/19/2013   Generalized anxiety disorder 07/11/2013   Esophageal reflux 11/29/2012    Screening Tests Immunization History  Administered Date(s) Administered   Influenza Split 06/04/2014, 06/05/2015   Influenza, High Dose Seasonal PF 05/30/2017, 05/22/2018, 05/21/2019, 05/26/2021, 06/06/2022, 06/08/2023   Influenza-Unspecified 06/06/2013, 06/13/2016   Pneumococcal Conjugate-13 06/05/2015   Pneumococcal Polysaccharide-23 04/06/2014   Pneumococcal-Unspecified 09/04/1996, 09/04/2016   Td 09/04/2005   Tdap 03/22/2017    Health Maintenance  Topic Date Due   COVID-19 Vaccine (1) Never done   Zoster Vaccines- Shingrix (1 of 2) Never done   Colonoscopy  08/21/2023 (Originally 12/13/2022)   DEXA SCAN  06/24/2023   Medicare Annual Wellness (AWV)  02/19/2024   MAMMOGRAM  02/26/2025   DTaP/Tdap/Td (3 - Td or Tdap) 03/23/2027   INFLUENZA VACCINE  Completed   Hepatitis C Screening  Completed   HPV VACCINES  Aged Out   Pneumonia Vaccine 10+ Years old  Discontinued     Names of Other Physician/Practitioners you currently use: 1. [facility] here for primary care 2. [facility], eye doctor, last visit 24/2022 3. [employer], dentist, last visit 2020, Due, encouraged to schedule   Patient Care Team: Lucky Cowboy, MD as PCP - General (Internal Medicine) Doristine Counter, MD as Referring Physician (Psychiatry) Nils Flack, MD as Referring Physician (Dermatology) Sundra Aland, [facility] (Inactive) as Pharmacist (Pharmacist)  SURGICAL HISTORY She  has a past surgical history that includes Abdominal hysterectomy; Tubal ligation; Foot surgery (Left, 2008); Colonoscopy; Upper  gastrointestinal endoscopy; Oophorectomy (Right); Carpometacarpel [facility]) suspension plasty (Right, 12/08/2013); I & D extremity (Right, 01/20/2017); and Hand surgery (Right). FAMILY HISTORY Her family history includes Asthma in her mother; CVA in her brother; Dementia in her brother; Heart Problems in her maternal grandfather; Heart attack in her brother; Heart disease in her brother and father; Other in her brother; Pancreatic cancer in her brother; Tuberculosis in her daughter. SOCIAL HISTORY She  reports that she has never smoked. She has been exposed to tobacco smoke. She has never used smokeless tobacco. She reports that she does not drink alcohol and does not use drugs.   Review of Systems  Constitutional:  Negative for malaise/fatigue and weight loss.  HENT:  Positive for hearing loss (R, has number to schedule for hearing aids). Negative for tinnitus.   Eyes:  Negative for blurred vision and double vision.  Respiratory:  Negative for cough, shortness of breath and wheezing.   Cardiovascular:  Negative for chest pain, palpitations, orthopnea, claudication and leg swelling.  Gastrointestinal:  Negative for abdominal pain, blood in stool, constipation, diarrhea, heartburn, melena, nausea and vomiting.  Genitourinary: Negative.   Musculoskeletal:  Positive for back pain (occasional) and joint pain (knees). Negative for myalgias.  Skin:  Negative for rash.       Ecchymoses, skin tear left upper arm  Neurological:  Negative for dizziness, tingling, sensory change, weakness and headaches.  Endo/Heme/Allergies:  Negative for environmental allergies and polydipsia. Bruises/bleeds easily.  Psychiatric/Behavioral: Negative.  Negative for depression (improved with meds), memory loss, substance abuse and suicidal ideas. The patient is not nervous/anxious and does not have insomnia.   All other systems reviewed and are negative.   Objective:     Today's Vitals   06/08/23 1101  BP: (!) 140/90   Pulse: 60  Temp: 97.9 F (36.6 C)  SpO2: 99%  Weight: 120 lb 6.4 oz (  54.6 kg)  Height: 5\' 3"  (1.6 m)   Body mass index is 21.33 kg/m.  General appearance: Pleasant thin female, no distress HEENT: normocephalic, sclerae anicteric, TMs pearly, nares patent/ pale nasal mucosa, no discharge or erythema, pharynx normal. Poor dentition Oral cavity: MMM, no lesions Neck: supple, no lymphadenopathy, no thyromegaly, no masses Heart: RRR, normal S1, S2, no murmurs Lungs: CTA bilaterally, no wheezes, rhonchi, or rales Abdomen: +bs, soft, non-tender, non distended, no masses, no hepatomegaly,  Splenomegaly noted.   Musculoskeletal: Full ROM all extremities, nontender, no swelling, no obvious deformity. Non-antalgic gait.  SKIN: warm, dry. Multiple skin ecchymoses. Skin tear of right upper arm Extremities: no edema, no cyanosis, no clubbing Pulses: 2+ symmetric, upper and lower extremities, normal cap refill Neurological: alert, oriented x 3, CN2-12 intact, strength normal upper extremities and lower extremities, sensation normal throughout, DTRs 2+  no cerebellar signs. Psychiatric: Normal affect, behavior normal, pleasant    Adela Glimpse, NP 4:52 PM [facility]

## 2023-06-11 LAB — COMPLETE METABOLIC PANEL WITH GFR
AG Ratio: 1.5 (calc) (ref 1.0–2.5)
ALT: 25 U/L (ref 6–29)
AST: 42 U/L — ABNORMAL HIGH (ref 10–35)
Albumin: 4 g/dL (ref 3.6–5.1)
Alkaline phosphatase (APISO): 91 U/L (ref 37–153)
BUN/Creatinine Ratio: 13 (calc) (ref 6–22)
BUN: 13 mg/dL (ref 7–25)
CO2: 25 mmol/L (ref 20–32)
Calcium: 9.5 mg/dL (ref 8.6–10.4)
Chloride: 109 mmol/L (ref 98–110)
Creat: 1.04 mg/dL — ABNORMAL HIGH (ref 0.60–1.00)
Globulin: 2.7 g/dL (ref 1.9–3.7)
Glucose, Bld: 60 mg/dL — ABNORMAL LOW (ref 65–99)
Potassium: 4.3 mmol/L (ref 3.5–5.3)
Sodium: 142 mmol/L (ref 135–146)
Total Bilirubin: 0.4 mg/dL (ref 0.2–1.2)
Total Protein: 6.7 g/dL (ref 6.1–8.1)
eGFR: 57 mL/min/{1.73_m2} — ABNORMAL LOW (ref >=60)

## 2023-06-11 LAB — LIPID PANEL
Cholesterol: 156 mg/dL (ref <200)
HDL: 61 mg/dL (ref >=50)
LDL Cholesterol (Calc): 79 mg/dL
Non-HDL Cholesterol (Calc): 95 mg/dL (ref <130)
Total CHOL/HDL Ratio: 2.6 (calc) (ref <5.0)
Triglycerides: 81 mg/dL (ref <150)

## 2023-06-11 LAB — CBC WITH DIFFERENTIAL/PLATELET
Absolute Monocytes: 437 {cells}/uL (ref 200–950)
Basophils Absolute: 41 {cells}/uL (ref 0–200)
Basophils Relative: 0.9 %
Eosinophils Absolute: 59 {cells}/uL (ref 15–500)
Eosinophils Relative: 1.3 %
HCT: 42.2 % (ref 35.0–45.0)
Hemoglobin: 13.2 g/dL (ref 11.7–15.5)
Lymphs Abs: 1413 {cells}/uL (ref 850–3900)
MCH: 28.8 pg (ref 27.0–33.0)
MCHC: 31.3 g/dL — ABNORMAL LOW (ref 32.0–36.0)
MCV: 91.9 fL (ref 80.0–100.0)
MPV: 9.9 fL (ref 7.5–12.5)
Monocytes Relative: 9.7 %
Neutro Abs: 2552 {cells}/uL (ref 1500–7800)
Neutrophils Relative %: 56.7 %
Platelets: 241 10*3/uL (ref 140–400)
RBC: 4.59 10*6/uL (ref 3.80–5.10)
RDW: 14 % (ref 11.0–15.0)
Total Lymphocyte: 31.4 %
WBC: 4.5 10*3/uL (ref 3.8–10.8)

## 2023-06-11 LAB — INSULIN, RANDOM: Insulin: 35.7 u[IU]/mL — ABNORMAL HIGH

## 2023-06-11 LAB — TSH: TSH: 3.19 m[IU]/L (ref 0.40–4.50)

## 2023-06-11 LAB — VITAMIN D 25 HYDROXY (VIT D DEFICIENCY, FRACTURES): Vit D, 25-Hydroxy: 42 ng/mL (ref 30–100)

## 2023-06-11 LAB — HEMOGLOBIN A1C
Hgb A1c MFr Bld: 5.7 %{Hb} — ABNORMAL HIGH (ref <5.7)
Mean Plasma Glucose: 117 mg/dL
eAG (mmol/L): 6.5 mmol/L

## 2023-06-11 LAB — MAGNESIUM: Magnesium: 2.3 mg/dL (ref 1.5–2.5)

## 2023-06-14 ENCOUNTER — Encounter: Payer: Self-pay | Admitting: Nurse Practitioner

## 2023-06-14 NOTE — Patient Instructions (Signed)

## 2023-06-29 ENCOUNTER — Ambulatory Visit: Payer: Medicare Other | Admitting: Internal Medicine

## 2023-06-29 ENCOUNTER — Ambulatory Visit: Payer: Medicare Other | Admitting: Nurse Practitioner

## 2023-07-06 ENCOUNTER — Ambulatory Visit: Payer: Medicare Other | Admitting: Nurse Practitioner

## 2023-07-16 ENCOUNTER — Other Ambulatory Visit: Payer: Self-pay | Admitting: Nurse Practitioner

## 2023-07-16 DIAGNOSIS — E876 Hypokalemia: Secondary | ICD-10-CM

## 2023-08-06 ENCOUNTER — Telehealth: Payer: Self-pay

## 2023-08-06 NOTE — Telephone Encounter (Signed)
LVM for patient to return call to the office, I am inquiring about recent blood pressure readings.

## 2023-08-07 ENCOUNTER — Other Ambulatory Visit: Payer: Self-pay | Admitting: Internal Medicine

## 2023-08-07 DIAGNOSIS — F411 Generalized anxiety disorder: Secondary | ICD-10-CM

## 2023-08-07 MED ORDER — HYDROXYZINE HCL 50 MG PO TABS: ORAL_TABLET | ORAL | 0 refills | Status: AC

## 2023-10-19 ENCOUNTER — Other Ambulatory Visit: Payer: Self-pay

## 2023-10-19 DIAGNOSIS — I10 Essential (primary) hypertension: Secondary | ICD-10-CM

## 2023-10-19 MED ORDER — OLMESARTAN MEDOXOMIL 20 MG PO TABS: ORAL_TABLET | ORAL | 0 refills | Status: AC

## 2023-11-20 ENCOUNTER — Encounter: Payer: Medicare Other | Admitting: Internal Medicine

## 2024-01-17 ENCOUNTER — Other Ambulatory Visit: Payer: Self-pay | Admitting: Family

## 2024-01-17 DIAGNOSIS — I10 Essential (primary) hypertension: Secondary | ICD-10-CM

## 2024-02-20 ENCOUNTER — Ambulatory Visit: Payer: Medicare Other | Admitting: Nurse Practitioner

## 2024-04-24 ENCOUNTER — Other Ambulatory Visit: Payer: Self-pay | Admitting: Nurse Practitioner

## 2024-04-24 DIAGNOSIS — R634 Abnormal weight loss: Secondary | ICD-10-CM

## 2024-04-24 DIAGNOSIS — F339 Major depressive disorder, recurrent, unspecified: Secondary | ICD-10-CM

## 2024-10-09 ENCOUNTER — Other Ambulatory Visit: Payer: Self-pay | Admitting: Nurse Practitioner

## 2024-10-09 DIAGNOSIS — E876 Hypokalemia: Secondary | ICD-10-CM
